# Patient Record
Sex: Female | Born: 1945
Health system: Southern US, Community
[De-identification: ages and names within clinical notes are randomized; demographics above are authoritative.]

## PROBLEM LIST (undated history)

## (undated) DIAGNOSIS — E039 Hypothyroidism, unspecified: Secondary | ICD-10-CM

## (undated) DIAGNOSIS — M359 Systemic involvement of connective tissue, unspecified: Secondary | ICD-10-CM

## (undated) DIAGNOSIS — Z9889 Other specified postprocedural states: Secondary | ICD-10-CM

## (undated) DIAGNOSIS — E079 Disorder of thyroid, unspecified: Secondary | ICD-10-CM

## (undated) DIAGNOSIS — M797 Fibromyalgia: Secondary | ICD-10-CM

## (undated) DIAGNOSIS — E785 Hyperlipidemia, unspecified: Secondary | ICD-10-CM

## (undated) DIAGNOSIS — N200 Calculus of kidney: Secondary | ICD-10-CM

## (undated) DIAGNOSIS — I10 Essential (primary) hypertension: Secondary | ICD-10-CM

## (undated) DIAGNOSIS — K649 Unspecified hemorrhoids: Secondary | ICD-10-CM

## (undated) DIAGNOSIS — M1990 Unspecified osteoarthritis, unspecified site: Secondary | ICD-10-CM

## (undated) DIAGNOSIS — R112 Nausea with vomiting, unspecified: Secondary | ICD-10-CM

## (undated) DIAGNOSIS — H3530 Unspecified macular degeneration: Secondary | ICD-10-CM

## (undated) HISTORY — DX: Systemic involvement of connective tissue, unspecified: M35.9

## (undated) HISTORY — DX: Fibromyalgia: M79.7

## (undated) HISTORY — PX: KNEE SURGERY: SHX244

## (undated) HISTORY — DX: Essential (primary) hypertension: I10

## (undated) HISTORY — DX: Disorder of thyroid, unspecified: E07.9

## (undated) HISTORY — DX: Calculus of kidney: N20.0

## (undated) HISTORY — DX: Hyperlipidemia, unspecified: E78.5

## (undated) HISTORY — PX: EYE SURGERY: SHX253

---
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
## 1987-11-06 HISTORY — PX: ABDOMINAL HYSTERECTOMY: SHX81

## 1992-11-05 HISTORY — PX: KNEE ARTHROSCOPY: SHX127

## 1994-11-05 HISTORY — PX: CHOLECYSTECTOMY: SHX55

## 1998-11-23 ENCOUNTER — Ambulatory Visit (HOSPITAL_COMMUNITY): Admission: RE | Admit: 1998-11-23 | Discharge: 1998-11-23 | Payer: Self-pay | Admitting: *Deleted

## 1999-08-15 ENCOUNTER — Ambulatory Visit (HOSPITAL_COMMUNITY): Admission: RE | Admit: 1999-08-15 | Discharge: 1999-08-15 | Payer: Self-pay | Admitting: *Deleted

## 1999-12-25 ENCOUNTER — Other Ambulatory Visit: Admission: RE | Admit: 1999-12-25 | Discharge: 1999-12-25 | Payer: Self-pay | Admitting: *Deleted

## 2000-01-09 ENCOUNTER — Ambulatory Visit (HOSPITAL_COMMUNITY): Admission: RE | Admit: 2000-01-09 | Discharge: 2000-01-09 | Payer: Self-pay | Admitting: *Deleted

## 2001-03-26 ENCOUNTER — Encounter: Payer: Self-pay | Admitting: Internal Medicine

## 2001-03-26 ENCOUNTER — Ambulatory Visit (HOSPITAL_COMMUNITY): Admission: RE | Admit: 2001-03-26 | Discharge: 2001-03-26 | Payer: Self-pay | Admitting: Internal Medicine

## 2001-04-06 ENCOUNTER — Encounter: Payer: Self-pay | Admitting: Internal Medicine

## 2001-04-07 ENCOUNTER — Encounter: Admission: RE | Admit: 2001-04-07 | Discharge: 2001-04-07 | Payer: Self-pay | Admitting: Internal Medicine

## 2001-11-05 HISTORY — PX: NECK SURGERY: SHX720

## 2001-11-12 ENCOUNTER — Encounter: Admission: RE | Admit: 2001-11-12 | Discharge: 2001-11-12 | Payer: Self-pay | Admitting: Rheumatology

## 2001-11-12 ENCOUNTER — Encounter: Payer: Self-pay | Admitting: Rheumatology

## 2002-03-13 ENCOUNTER — Encounter: Payer: Self-pay | Admitting: Neurosurgery

## 2002-03-13 ENCOUNTER — Inpatient Hospital Stay (HOSPITAL_COMMUNITY): Admission: RE | Admit: 2002-03-13 | Discharge: 2002-03-15 | Payer: Self-pay | Admitting: Neurosurgery

## 2002-05-05 ENCOUNTER — Ambulatory Visit (HOSPITAL_COMMUNITY): Admission: RE | Admit: 2002-05-05 | Discharge: 2002-05-05 | Payer: Self-pay | Admitting: Internal Medicine

## 2002-05-05 ENCOUNTER — Encounter: Payer: Self-pay | Admitting: Internal Medicine

## 2002-07-07 ENCOUNTER — Encounter: Payer: Self-pay | Admitting: Internal Medicine

## 2002-07-07 ENCOUNTER — Encounter: Admission: RE | Admit: 2002-07-07 | Discharge: 2002-07-07 | Payer: Self-pay | Admitting: Internal Medicine

## 2004-06-30 ENCOUNTER — Ambulatory Visit (HOSPITAL_COMMUNITY): Admission: RE | Admit: 2004-06-30 | Discharge: 2004-06-30 | Payer: Self-pay | Admitting: Internal Medicine

## 2005-02-12 ENCOUNTER — Ambulatory Visit (HOSPITAL_COMMUNITY): Admission: RE | Admit: 2005-02-12 | Discharge: 2005-02-12 | Payer: Self-pay | Admitting: Specialist

## 2005-07-06 ENCOUNTER — Ambulatory Visit (HOSPITAL_COMMUNITY): Admission: RE | Admit: 2005-07-06 | Discharge: 2005-07-06 | Payer: Self-pay | Admitting: Internal Medicine

## 2006-08-07 ENCOUNTER — Ambulatory Visit (HOSPITAL_COMMUNITY): Admission: RE | Admit: 2006-08-07 | Discharge: 2006-08-07 | Payer: Self-pay | Admitting: Internal Medicine

## 2007-09-05 ENCOUNTER — Encounter: Admission: RE | Admit: 2007-09-05 | Discharge: 2007-09-05 | Payer: Self-pay | Admitting: Internal Medicine

## 2007-09-05 ENCOUNTER — Ambulatory Visit (HOSPITAL_COMMUNITY): Admission: RE | Admit: 2007-09-05 | Discharge: 2007-09-05 | Payer: Self-pay | Admitting: Internal Medicine

## 2008-08-12 ENCOUNTER — Ambulatory Visit: Payer: Self-pay | Admitting: Internal Medicine

## 2008-08-12 DIAGNOSIS — M791 Myalgia, unspecified site: Secondary | ICD-10-CM | POA: Insufficient documentation

## 2008-08-13 DIAGNOSIS — R51 Headache: Secondary | ICD-10-CM | POA: Insufficient documentation

## 2008-08-13 DIAGNOSIS — E039 Hypothyroidism, unspecified: Secondary | ICD-10-CM | POA: Insufficient documentation

## 2008-08-13 DIAGNOSIS — E785 Hyperlipidemia, unspecified: Secondary | ICD-10-CM | POA: Insufficient documentation

## 2008-08-13 DIAGNOSIS — I10 Essential (primary) hypertension: Secondary | ICD-10-CM | POA: Insufficient documentation

## 2008-08-13 DIAGNOSIS — R519 Headache, unspecified: Secondary | ICD-10-CM | POA: Insufficient documentation

## 2008-08-25 ENCOUNTER — Encounter: Payer: Self-pay | Admitting: Internal Medicine

## 2008-09-13 ENCOUNTER — Ambulatory Visit (HOSPITAL_COMMUNITY): Admission: RE | Admit: 2008-09-13 | Discharge: 2008-09-13 | Payer: Self-pay | Admitting: Internal Medicine

## 2008-10-21 ENCOUNTER — Telehealth: Payer: Self-pay | Admitting: Internal Medicine

## 2008-10-21 ENCOUNTER — Ambulatory Visit: Payer: Self-pay | Admitting: Internal Medicine

## 2008-10-21 LAB — CONVERTED CEMR LAB
ALT: 14 units/L (ref 0–35)
AST: 16 units/L (ref 0–37)
Basophils Relative: 0.3 % (ref 0.0–3.0)
Cholesterol: 238 mg/dL (ref 0–200)
Direct LDL: 162 mg/dL
Eosinophils Relative: 4.1 % (ref 0.0–5.0)
GFR calc Af Amer: 94 mL/min
Glucose, Bld: 91 mg/dL (ref 70–99)
HCT: 37.5 % (ref 36.0–46.0)
HDL: 51.5 mg/dL (ref >39.0)
MCV: 94.9 fL (ref 78.0–100.0)
Neutro Abs: 1.2 10*3/uL — ABNORMAL LOW (ref 1.4–7.7)
RBC: 3.95 M/uL (ref 3.87–5.11)
TSH: 6.79 microintl units/mL — ABNORMAL HIGH (ref 0.35–5.50)
Total Bilirubin: 0.4 mg/dL (ref 0.3–1.2)
Total CHOL/HDL Ratio: 4.6
VLDL: 15 mg/dL (ref 0–40)
Varicella IgG: 3.68 — ABNORMAL HIGH

## 2008-10-26 ENCOUNTER — Telehealth: Payer: Self-pay | Admitting: Internal Medicine

## 2008-10-26 ENCOUNTER — Ambulatory Visit: Payer: Self-pay | Admitting: Internal Medicine

## 2008-10-26 DIAGNOSIS — E559 Vitamin D deficiency, unspecified: Secondary | ICD-10-CM | POA: Insufficient documentation

## 2008-11-22 ENCOUNTER — Telehealth: Payer: Self-pay | Admitting: Internal Medicine

## 2008-12-22 ENCOUNTER — Ambulatory Visit: Payer: Self-pay | Admitting: Internal Medicine

## 2008-12-22 LAB — CONVERTED CEMR LAB: TSH: 0.11 microintl units/mL — ABNORMAL LOW (ref 0.35–5.50)

## 2008-12-28 ENCOUNTER — Ambulatory Visit: Payer: Self-pay | Admitting: Internal Medicine

## 2008-12-29 ENCOUNTER — Encounter: Payer: Self-pay | Admitting: Internal Medicine

## 2008-12-29 ENCOUNTER — Telehealth (INDEPENDENT_AMBULATORY_CARE_PROVIDER_SITE_OTHER): Payer: Self-pay | Admitting: *Deleted

## 2009-03-11 ENCOUNTER — Encounter: Payer: Self-pay | Admitting: Internal Medicine

## 2009-03-24 ENCOUNTER — Ambulatory Visit: Payer: Self-pay | Admitting: Internal Medicine

## 2009-03-24 LAB — CONVERTED CEMR LAB
ALT: 18 units/L (ref 0–35)
AST: 17 units/L (ref 0–37)
Albumin: 3.6 g/dL (ref 3.5–5.2)
Creatinine, Ser: 0.8 mg/dL (ref 0.4–1.2)
Eosinophils Relative: 2 % (ref 0.0–5.0)
HCT: 35.1 % — ABNORMAL LOW (ref 36.0–46.0)
Lymphocytes Relative: 55.8 % — ABNORMAL HIGH (ref 12.0–46.0)
Lymphs Abs: 2 10*3/uL (ref 0.7–4.0)
Monocytes Absolute: 0.3 10*3/uL (ref 0.1–1.0)
Neutro Abs: 1.2 10*3/uL — ABNORMAL LOW (ref 1.4–7.7)
Platelets: 213 10*3/uL (ref 150.0–400.0)
RBC: 3.76 M/uL — ABNORMAL LOW (ref 3.87–5.11)
TSH: 1.24 microintl units/mL (ref 0.35–5.50)

## 2009-03-29 ENCOUNTER — Ambulatory Visit: Payer: Self-pay | Admitting: Internal Medicine

## 2009-04-26 ENCOUNTER — Encounter: Payer: Self-pay | Admitting: Internal Medicine

## 2009-07-04 ENCOUNTER — Ambulatory Visit: Payer: Self-pay | Admitting: Internal Medicine

## 2009-07-04 LAB — CONVERTED CEMR LAB
ALT: 12 units/L (ref 0–35)
AST: 15 units/L (ref 0–37)
Eosinophils Absolute: 0.2 10*3/uL (ref 0.0–0.7)
Hemoglobin: 12.8 g/dL (ref 12.0–15.0)
Neutro Abs: 1.1 10*3/uL — ABNORMAL LOW (ref 1.7–7.7)
Platelets: 240 10*3/uL (ref 150–400)
RBC: 4.13 M/uL (ref 3.87–5.11)
RDW: 12.9 % (ref 11.5–15.5)
TSH: 4.233 microintl units/mL (ref 0.350–4.500)
WBC: 4.1 10*3/uL (ref 4.0–10.5)

## 2009-07-05 ENCOUNTER — Telehealth: Payer: Self-pay | Admitting: Internal Medicine

## 2009-07-19 ENCOUNTER — Encounter: Payer: Self-pay | Admitting: Internal Medicine

## 2009-09-27 ENCOUNTER — Telehealth: Payer: Self-pay | Admitting: Internal Medicine

## 2009-09-27 ENCOUNTER — Ambulatory Visit: Payer: Self-pay | Admitting: Internal Medicine

## 2009-09-27 LAB — CONVERTED CEMR LAB
ALT: 15 units/L (ref 0–35)
AST: 19 units/L (ref 0–37)
Basophils Absolute: 0 10*3/uL (ref 0.0–0.1)
Basophils Relative: 1 % (ref 0–1)
Eosinophils Absolute: 0.1 10*3/uL (ref 0.0–0.7)
Eosinophils Relative: 4 % (ref 0–5)
HCT: 37.1 % (ref 36.0–46.0)
MCHC: 32.3 g/dL (ref 30.0–36.0)
Monocytes Absolute: 0.3 10*3/uL (ref 0.1–1.0)
Neutro Abs: 1.3 10*3/uL — ABNORMAL LOW (ref 1.7–7.7)
Neutrophils Relative %: 33 % — ABNORMAL LOW (ref 43–77)
RBC: 3.96 M/uL (ref 3.87–5.11)

## 2009-10-03 ENCOUNTER — Telehealth: Payer: Self-pay | Admitting: Internal Medicine

## 2009-10-04 ENCOUNTER — Ambulatory Visit: Payer: Self-pay | Admitting: Internal Medicine

## 2009-10-04 DIAGNOSIS — M359 Systemic involvement of connective tissue, unspecified: Secondary | ICD-10-CM | POA: Insufficient documentation

## 2009-10-04 DIAGNOSIS — M797 Fibromyalgia: Secondary | ICD-10-CM | POA: Insufficient documentation

## 2009-10-04 DIAGNOSIS — G4700 Insomnia, unspecified: Secondary | ICD-10-CM | POA: Insufficient documentation

## 2009-10-04 LAB — CONVERTED CEMR LAB

## 2009-10-25 ENCOUNTER — Telehealth (INDEPENDENT_AMBULATORY_CARE_PROVIDER_SITE_OTHER): Payer: Self-pay | Admitting: *Deleted

## 2009-11-01 ENCOUNTER — Ambulatory Visit: Payer: Self-pay | Admitting: Internal Medicine

## 2009-11-03 ENCOUNTER — Telehealth: Payer: Self-pay | Admitting: Internal Medicine

## 2009-11-10 ENCOUNTER — Encounter: Admission: RE | Admit: 2009-11-10 | Discharge: 2009-11-10 | Payer: Self-pay | Admitting: Internal Medicine

## 2009-11-16 ENCOUNTER — Encounter: Payer: Self-pay | Admitting: Internal Medicine

## 2010-01-02 ENCOUNTER — Encounter: Payer: Self-pay | Admitting: Internal Medicine

## 2010-01-20 ENCOUNTER — Telehealth: Payer: Self-pay | Admitting: Internal Medicine

## 2010-01-20 ENCOUNTER — Ambulatory Visit: Payer: Self-pay | Admitting: Diagnostic Radiology

## 2010-01-20 ENCOUNTER — Ambulatory Visit (HOSPITAL_BASED_OUTPATIENT_CLINIC_OR_DEPARTMENT_OTHER): Admission: RE | Admit: 2010-01-20 | Discharge: 2010-01-20 | Payer: Self-pay | Admitting: Internal Medicine

## 2010-01-20 ENCOUNTER — Ambulatory Visit: Payer: Self-pay | Admitting: Internal Medicine

## 2010-01-20 DIAGNOSIS — R142 Eructation: Secondary | ICD-10-CM

## 2010-01-20 DIAGNOSIS — R143 Flatulence: Secondary | ICD-10-CM

## 2010-01-20 DIAGNOSIS — R141 Gas pain: Secondary | ICD-10-CM | POA: Insufficient documentation

## 2010-04-17 ENCOUNTER — Encounter: Payer: Self-pay | Admitting: Internal Medicine

## 2010-07-04 ENCOUNTER — Encounter: Payer: Self-pay | Admitting: Internal Medicine

## 2010-07-04 ENCOUNTER — Ambulatory Visit: Payer: Self-pay | Admitting: Family

## 2010-07-04 DIAGNOSIS — N390 Urinary tract infection, site not specified: Secondary | ICD-10-CM | POA: Insufficient documentation

## 2010-07-04 LAB — CONVERTED CEMR LAB
Nitrite: NEGATIVE
Urobilinogen, UA: 0.2
WBC Urine, dipstick: NEGATIVE
pH: 5

## 2010-07-17 ENCOUNTER — Encounter: Payer: Self-pay | Admitting: Internal Medicine

## 2010-07-19 ENCOUNTER — Telehealth: Payer: Self-pay | Admitting: Internal Medicine

## 2010-07-24 ENCOUNTER — Ambulatory Visit: Payer: Self-pay | Admitting: Internal Medicine

## 2010-07-24 DIAGNOSIS — F329 Major depressive disorder, single episode, unspecified: Secondary | ICD-10-CM | POA: Insufficient documentation

## 2010-07-24 DIAGNOSIS — F3289 Other specified depressive episodes: Secondary | ICD-10-CM | POA: Insufficient documentation

## 2010-08-23 ENCOUNTER — Ambulatory Visit: Payer: Self-pay | Admitting: Internal Medicine

## 2010-08-30 ENCOUNTER — Telehealth: Payer: Self-pay | Admitting: Internal Medicine

## 2010-08-31 ENCOUNTER — Encounter: Payer: Self-pay | Admitting: Internal Medicine

## 2010-09-04 ENCOUNTER — Telehealth: Payer: Self-pay | Admitting: Internal Medicine

## 2010-09-19 ENCOUNTER — Telehealth: Payer: Self-pay | Admitting: Internal Medicine

## 2010-10-05 ENCOUNTER — Telehealth: Payer: Self-pay | Admitting: Internal Medicine

## 2010-10-06 ENCOUNTER — Ambulatory Visit: Payer: Self-pay | Admitting: Internal Medicine

## 2010-10-06 LAB — CONVERTED CEMR LAB
Albumin: 4 g/dL (ref 3.5–5.2)
CRP, High Sensitivity: 0.7
Calcium: 9.2 mg/dL (ref 8.4–10.5)
Chloride: 111 meq/L (ref 96–112)
Cholesterol: 200 mg/dL (ref 0–200)
Creatinine, Ser: 0.82 mg/dL (ref 0.40–1.20)
Glucose, Bld: 93 mg/dL (ref 70–99)
HDL: 56 mg/dL (ref >39)
Hemoglobin: 12.1 g/dL (ref 12.0–15.0)
Indirect Bilirubin: 0.2 mg/dL (ref 0.0–0.9)
MCHC: 31.7 g/dL (ref 30.0–36.0)
TSH: 0.296 microintl units/mL — ABNORMAL LOW (ref 0.350–4.500)
Total CHOL/HDL Ratio: 3.6
Total Protein: 6.4 g/dL (ref 6.0–8.3)
Triglycerides: 102 mg/dL (ref <150)
Vit D, 1,25-Dihydroxy: 32 (ref 30–89)
WBC: 4 10*3/uL (ref 4.0–10.5)

## 2010-10-09 ENCOUNTER — Telehealth: Payer: Self-pay | Admitting: Internal Medicine

## 2010-11-05 HISTORY — PX: BUNIONECTOMY: SHX129

## 2010-11-20 ENCOUNTER — Encounter
Admission: RE | Admit: 2010-11-20 | Discharge: 2010-11-20 | Payer: Self-pay | Source: Home / Self Care | Attending: Internal Medicine | Admitting: Internal Medicine

## 2010-11-21 ENCOUNTER — Telehealth: Payer: Self-pay | Admitting: Internal Medicine

## 2010-11-29 ENCOUNTER — Telehealth: Payer: Self-pay | Admitting: Internal Medicine

## 2010-12-07 NOTE — Progress Notes (Signed)
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 DO,  August 31, 2010 1:25 PM  Additional Follow-up for Phone Call Additional follow up Details #1::        call placed to patient at [phone], she was advised letter would be left at front desk for pick. Patient verbalized understanding, she will pick letter up at front desk. Additional Follow-up by: Glendell Docker CMA,  August 31, 2010 1:48 PM

## 2010-12-07 NOTE — Progress Notes (Signed)
Summary: Synthroid Refill  Phone Note Call from Patient Call back at Home Phone [phone]   Caller: Patient Call For: [name] DO Summary of Call: patient called and left voice message requesting a refill on her Synthroid. She states her blood work is not scheduled until February, however she will run out of medication before then.  Initial call taken by: Glendell Docker CMA,  November 21, 2010 2:38 PM    Prescriptions: SYNTHROID 137 MCG TABS (LEVOTHYROXINE SODIUM) one by mouth once daily  #30 x 0   Entered by:   Glendell Docker CMA   Authorized by:   [name] DO   Signed by:   Glendell Docker CMA on 11/21/2010   Method used:   Electronically to        [facility] (retail)       [address]       [place], [place]  [address]       Ph: [phone]       Fax: [phone]   RxID:   [id number]

## 2010-12-07 NOTE — Progress Notes (Signed)
Summary: Lab Orders  Phone Note Call from Patient Call back at [phone]   Caller: Patient Summary of Call: Pt is requesting lab orders to be sent to [facility] in [place], pt will go 07/20/10 [address] Antionette Poles at [facility] in [place], order can be faxed to [phone], phone # [phone] Initial call taken by: Lannette Donath,  July 19, 2010 10:09 AM  Follow-up for Phone Call        orders have been faxed to [phone] Follow-up by: Glendell Docker CMA,  July 19, 2010 10:32 AM

## 2010-12-07 NOTE — Letter (Signed)
Summary: [facility]  [facility]   Imported By: Lanelle Bal 05/04/2010 16:07:39  _____________________________________________________________________  External Attachment:    Type:   Image     Comment:   External Document

## 2010-12-07 NOTE — Progress Notes (Signed)
Summary: Budeproprion Refill  Phone Note Refill Request Message from:  Pharmacy on November 29, 2010 11:54 AM  Refills Requested: Medication #1:  BUDEPRION SR 150 MG TB12 1 by mouth 2 times daily   Dosage confirmed as above?Dosage Confirmed   Brand Name Necessary? No   Supply Requested: 3 months  Method Requested: Electronic Next Appointment Scheduled: 04/09/2011 @ 10:45 am Initial call taken by: Glendell Docker CMA,  November 29, 2010 11:54 AM  Follow-up for Phone Call        call placed to [facility], rx cancelled and sent electronically to [facility].  Call placed to patient at [phone],she has been informed rx sent to [facility] per her request Follow-up by: Glendell Docker CMA,  December 01, 2010 8:39 AM    Prescriptions: BUDEPRION SR 150 MG TB12 (BUPROPION HCL) 1 by mouth 2 times daily  #180 x 1   Entered by:   Glendell Docker CMA   Authorized by:   [name] DO   Signed by:   Glendell Docker CMA on 12/01/2010   Method used:   Electronically to        [facility]* (retail)             ,          Ph: [phone]       Fax: [phone]   RxID:   [id number] BUDEPRION SR 150 MG TB12 (BUPROPION HCL) 1 by mouth 2 times daily  #60 x 5   Entered and Authorized by:   [name] DO   Signed by:   [name] DO on 11/30/2010   Method used:   Electronically to        [facility].* (retail)       [address]       [place], [place]  [address]       Ph: [phone]       Fax: [phone]   RxID:   [id number]

## 2010-12-07 NOTE — Assessment & Plan Note (Signed)
Summary: uti/mhf   Vital Signs:  Patient profile:   65 year old female Weight:      166 pounds BMI:     30.47 Temp:     98.0 degrees F oral Pulse rate:   76 / minute Pulse rhythm:   regular Resp:     18 per minute BP sitting:   102 / 70  (right arm) Cuff size:   regular  Vitals Entered By: Glendell Docker CMA (July 04, 2010 3:55 PM) CC: Urinary discomfort Comments c/o urinary frequency, abdominal pressure onset week ago, no self care measures taken, also c/o dry cough for the past 2 days   Primary Care Provider:  Dondra Spry DO  CC:  Urinary discomfort.  History of Present Illness: Paula Murphy is a 65 year old female with complaint of urinary discomfort x 1 week.  Notes that she completed Cymbalta 1 week ago.  Notes chronic urinary urgency/discomfort.  Denies any blood in urine.  Patient urinated "6 times" last night.  Denies symptoms or yeast infection or vaginal discharge.  Denies fever or low back pain.   Notes mild dry cough x 2 days.  + post-nasal drip, denies sinus pressure.    Preventive Screening-Counseling & Management  Alcohol-Tobacco     Smoking Status: never  Allergies: 1)  ! Hydrocodone  Past History:  Past Medical History: Last updated: 01/20/2010 Fibromyalgia - followed by Dr. Corliss Skains Autoimmune disease - + ANA, and DS DNA Headache    Hyperlipidemia Hypertension  Hypothyroidism     Review of Systems       see HPI  Physical Exam  General:  Well-developed,well-nourished,in no acute distress; alert,appropriate and cooperative throughout examination Head:  Normocephalic and atraumatic without obvious abnormalities. No apparent alopecia or balding. Lungs:  Normal respiratory effort, chest expands symmetrically. Lungs are clear to auscultation, no crackles or wheezes. Heart:  Normal rate and regular rhythm. S1 and S2 normal without gallop, murmur, click, rub or other extra sounds. Abdomen:  Bowel sounds positive,abdomen soft and non-tender without  masses, organomegaly or hernias noted.   Impression & Recommendations:  Problem # 1:  UTI (ICD-599.0) Assessment New Will plan to treat with cipro.  Send urine for culture. Suspect that dry cough is allergy related.   Her updated medication list for this problem includes:    Cipro 500 Mg Tabs (Ciprofloxacin hcl) ..... One tablet by mouth two times a day x 7 days  Orders: Specimen Handling (98119) T-Culture, Urine ([id number])  Complete Medication List: 1)  Synthroid 150 Mcg Tabs (Levothyroxine sodium) .... Take 1 tablet by mouth once a day 2)  Alprazolam 0.25 Mg Tabs (Alprazolam) .... Take 1 tablet by mouth three times a day as needed 3)  Topamax 50 Mg Tabs (Topiramate) .... Take 1 tablet by mouth two times a day as needed 4)  Valtrex 500 Mg Tabs (Valacyclovir hcl) .... 4 tablets by mouth every 4 hours for fever blister 5)  Lunesta 1 Mg Tabs (Eszopiclone) .... One tablet by mouth at bedtime as needed 6)  Fish Oil 1200 Mg Caps (Omega-3 fatty acids) .... 3 capsules by mouth once daily 7)  Plaquenil 200 Mg Tabs (Hydroxychloroquine sulfate) .... Take 1 tablet by mouth two times a day 8)  Budeprion Sr 150 Mg Tb12 (Bupropion hcl) .Marland Kitchen.. 1 by mouth 2 times daily 9)  Cipro 500 Mg Tabs (Ciprofloxacin hcl) .... One tablet by mouth two times a day x 7 days  Other Orders: UA Dipstick w/o Micro (manual) (30865)  Patient Instructions: 1)  Please call if your develop fever over 101, blood in urine,  back pain, if symptoms worsen, or if they do not improve.  Prescriptions: CIPRO 500 MG TABS (CIPROFLOXACIN HCL) one tablet by mouth two times a day x 7 days  #14 x 0   Entered and Authorized by:   Lemont Fillers FNP   Signed by:   Lemont Fillers FNP on 07/04/2010   Method used:   Electronically to        [facility].* (retail)       [address]       [place], [place]  [address]       Ph: [phone]       Fax: [phone]   RxID:   [id number]   Current Allergies  (reviewed today): ! HYDROCODONE  Laboratory Results   Urine Tests    Routine Urinalysis   Color: straw Appearance: Clear Glucose: negative   (Normal Range: Negative) Bilirubin: negative   (Normal Range: Negative) Ketone: moderate (40)   (Normal Range: Negative) Spec. Gravity: >=1.030   (Normal Range: 1.003-1.035) Blood: trace-lysed   (Normal Range: Negative) pH: 5.0   (Normal Range: 5.0-8.0) Protein: trace   (Normal Range: Negative) Urobilinogen: 0.2   (Normal Range: 0-1) Nitrite: negative   (Normal Range: Negative) Leukocyte Esterace: negative   (Normal Range: Negative)

## 2010-12-07 NOTE — Progress Notes (Signed)
Summary: Lab Results  Phone Note Outgoing Call   Summary of Call: call pt - blood work shows pt still taking too much thyroid medication.  see new rx.  repeat TSH in 2 months Initial call taken by: [name] DO,  October 09, 2010 1:12 PM  Follow-up for Phone Call        call placed to patient at [phone], she has been informed per Dr Artist Pais instructions. Lab has been entered for February 2012 for [place] Follow-up by: Glendell Docker CMA,  October 09, 2010 1:27 PM    New/Updated Medications: SYNTHROID 137 MCG TABS (LEVOTHYROXINE SODIUM) one by mouth once daily Prescriptions: SYNTHROID 137 MCG TABS (LEVOTHYROXINE SODIUM) one by mouth once daily  #30 x 3   Entered and Authorized by:   [name] DO   Signed by:   [name] DO on 10/09/2010   Method used:   Electronically to        [facility] (retail)       [address]       [place], [place]  [address]       Ph: [phone]       Fax: [phone]   RxID:   [id number]

## 2010-12-07 NOTE — Progress Notes (Signed)
Summary: REFILL SYNTHROID   Phone Note Refill Request Message from:  Fax from Pharmacy on October 05, 2010 10:21 AM  Refills Requested: Medication #1:  SYNTHROID 150 MCG TABS Take 1 tablet by mouth once a day   Dosage confirmed as above?Dosage Confirmed   Brand Name Necessary? No   Supply Requested: 3 months   Last Refilled: 06/30/2010 [facility] [id number] [address] [place] FAX [phone]   Method Requested: Electronic Next Appointment Scheduled: 10-06-10 DR Artist Pais Initial call taken by: Roselle Locus,  October 05, 2010 10:22 AM    Prescriptions: SYNTHROID 150 MCG TABS (LEVOTHYROXINE SODIUM) Take 1 tablet by mouth once a day  #90 x 1   Entered by:   Mervin Kung CMA (AAMA)   Authorized by:   [name] DO   Signed by:   Mervin Kung CMA (AAMA) on 10/05/2010   Method used:   Electronically to        [facility].* (retail)       [address]       [place], [place]  [address]       Ph: [phone]       Fax: [phone]   RxID:   [id number]

## 2010-12-07 NOTE — Progress Notes (Signed)
Summary: Test Results & Pharmacy Change  Phone Note Outgoing Call   Summary of Call: call pt - CXR - no acute findings.  no nodules or signs of lung cancer Initial call taken by: [name] DO,  January 20, 2010 3:47 PM  Follow-up for Phone Call        Called and informed patient of cxr results and patient states that her prescriptions were sent to the wrong pharm. and would like for Dr.Yoo to resend them to  [facility] in [place]  Follow-up by: Michaelle Copas,  January 23, 2010 10:32 AM  Additional Follow-up for Phone Call Additional follow up Details #1::        please send rx to new pharm Additional Follow-up by: [name] DO,  January 23, 2010 1:15 PM    Additional Follow-up for Phone Call Additional follow up Details #2::    Rxs sent to [facility], Spoke with the pharmacist Tammy Sours at [facility] rxs for metronidazole and synthroid were cancelled Follow-up by: Glendell Docker CMA,  January 23, 2010 1:59 PM  Prescriptions: METRONIDAZOLE 250 MG TABS (METRONIDAZOLE) one by mouth two times a day  #14 x 0   Entered by:   Glendell Docker CMA   Authorized by:   [name] DO   Signed by:   Glendell Docker CMA on 01/23/2010   Method used:   Electronically to        [facility].* (retail)       [address]       [place], [place]  [address]       Ph: [phone]       Fax: [phone]   RxID:   [id number] SYNTHROID 150 MCG TABS (LEVOTHYROXINE SODIUM) Take 1 tablet by mouth once a day  #90 x 1   Entered by:   Glendell Docker CMA   Authorized by:   [name] DO   Signed by:   Glendell Docker CMA on 01/23/2010   Method used:   Electronically to        [facility].* (retail)       [address]       [place], [place]  [address]       Ph: [phone]       Fax: [phone]   RxID:   [id number]

## 2010-12-07 NOTE — Assessment & Plan Note (Signed)
Summary: Paula Murphy   Vital Signs:  Patient profile:   65 year old female Height:      62 inches (157.48 cm) Weight:      165 pounds (75 kg) BMI:     30.29 O2 Sat:      99 % on Room air Temp:     97.7 degrees F (36.50 degrees C) oral Pulse rate:   75 / minute BP sitting:   124 / 80  (left arm) Cuff size:   regular  Vitals Entered By: Brenton Grills MA (July 24, 2010 8:51 AM)  O2 Flow:  Room air CC: Physical/aj   Primary Care Provider:  Dondra Spry DO  CC:  Physical/aj.  History of Present Illness: 65 y/o white female for cpx and f/u.  she has hx of fibro and possible inflammatory arthritis she is followed by rheum pt advised by her rheum not to take zostavax  int hx: she stopped taking cymbalta on her own.  she did not taper and exp significant withdrawal effects withdrawal symptoms better but notes increase in depressive symptoms her daughter notes change in her mood.  she is not suicidial  Current Diet Breakfast:  cheerios with banannas, occ blue berries Lunch:  usually no lunch,  sometimes protein bar or light sandwich DInner:  grilled chicken and green beans Snacks:  milk chocolate every day,  chocolate chip cookies Beverage:  water   Current Medications (verified): 1)  Synthroid 150 Mcg Tabs (Levothyroxine Sodium) .... Take 1 Tablet By Mouth Once A Day 2)  Alprazolam 0.25 Mg Tabs (Alprazolam) .... Take 1 Tablet By Mouth Three Times A Day As Needed 3)  Topamax 50 Mg Tabs (Topiramate) .... Take 1 Tablet By Mouth Two Times A Day As Needed 4)  Valtrex 500 Mg Tabs (Valacyclovir Hcl) .... 4 Tablets By Mouth Every 4 Hours For Fever Blister 5)  Lunesta 1 Mg Tabs (Eszopiclone) .... One Tablet By Mouth At Bedtime As Needed 6)  Fish Oil 1200 Mg Caps (Omega-3 Fatty Acids) .... 3 Capsules By Mouth Once Daily 7)  Plaquenil 200 Mg Tabs (Hydroxychloroquine Sulfate) .... Take 1 Tablet By Mouth Two Times A Day 8)  Budeprion Sr 150 Mg Tb12 (Bupropion Hcl) .Marland Kitchen.. 1 By Mouth 2  Times Daily 9)  Cipro 500 Mg Tabs (Ciprofloxacin Hcl) .... One Tablet By Mouth Two Times A Day X 7 Days 10)  Robaxin 500 Mg Tabs (Methocarbamol) .Marland Kitchen.. 1 By Mouth Three Times A Day As Needed 11)  Lidoderm 5 % Ptch (Lidocaine) .Marland Kitchen.. 1 Three Times A Day As Needed  Allergies (verified): 1)  ! Hydrocodone  Past History:  Past Medical History: Fibromyalgia - followed by Dr. Corliss Skains Autoimmune disease - + ANA, and DS DNA Headache    Hyperlipidemia  Hypertension  Hypothyroidism     Past Surgical History: Cholecystectomy - 1996 Hysterectomy - 1990   Tonsillectomy - 1953  Neck surgery -2003 Arthroscopic right knee surgery 1994    Family History: Stroke-mother, father Hypertension-mother, father Diabetes mellitus type II-father Hyperlipidemia-mother, father Lung cancer - mother, father  Rheumatoid arthritis  -father      Social History: Retired  Married 1 daughter and 66 y/o grandson  Never Smoked Alcohol use-yes (social)        Review of Systems       The patient complains of depression.  The patient denies weight loss, weight gain, chest pain, prolonged cough, abdominal pain, melena, hematochezia, and severe indigestion/heartburn.    Physical Exam  General:  alert, well-developed, and well-nourished.   Head:  normocephalic and atraumatic.   Eyes:  pupils equal, pupils round, and pupils reactive to light.   Ears:  R ear normal and L ear normal.   Mouth:  pharynx pink and moist.   Neck:  No deformities, masses, or tenderness noted.no carotid bruits.   Lungs:  Normal respiratory effort, chest expands symmetrically. Lungs are clear to auscultation, no crackles or wheezes. Heart:  Normal rate and regular rhythm. S1 and S2 normal without gallop, murmur, click, rub or other extra sounds. Abdomen:  soft, non-tender, normal bowel sounds, no masses, no hepatomegaly, and no splenomegaly.   Extremities:  No lower extremity edema  Neurologic:  cranial nerves II-XII intact and gait  normal.   Psych:  normally interactive, good eye contact, not anxious appearing, and not depressed appearing.     Impression & Recommendations:  Problem # 1:  HEALTH MAINTENANCE EXAM (ICD-V70.0) Reviewed adult health maintenance protocols.  Mammogram: ASSESSMENT: Negative - BI-RADS 1^MM DIGITAL SCREENING (11/10/2009) Pap smear: Declined-Hysterectomy (10/04/2009) Colonoscopy: normal (02/23/2008) Td Booster: Historical (07/22/2007)   Flu Vax: Historical (08/30/2009)   Pneumovax: Pneumovax (03/29/2009) Chol: 177 (12/22/2008)   HDL: 42.8 (12/22/2008)   LDL: 113 (12/22/2008)   TG: 107 (12/22/2008) TSH: 0.135 (09/27/2009)     Problem # 2:  HYPOTHYROIDISM (ICD-244.9) she has not been taking thyroid medication regularly recent TSH 6.396 keep same dose of thyroid medication.  pt will try to take as directed. plan - repeat TSH next month  Her updated medication list for this problem includes:    Synthroid 150 Mcg Tabs (Levothyroxine sodium) .Marland Kitchen... Take 1 tablet by mouth once a day  Problem # 3:  DEPRESSION (ICD-311) pt stopped cymbalta on her own.  she notes significant withdrawal symptoms.  her rheum rec psych referral but she would like for [place] to handle.   trial of low dose SSRA.  maintain bupropion. Patient advised to call office if symptoms persist or worsen.  Her updated medication list for this problem includes:    Alprazolam 0.25 Mg Tabs (Alprazolam) .Marland Kitchen... Take 1 tablet by mouth three times a day as needed    Budeprion Sr 150 Mg Tb12 (Bupropion hcl) .Marland Kitchen... 1 by mouth 2 times daily    Sertraline Hcl 25 Mg Tabs (Sertraline hcl) .Marland Kitchen... 1/2 by mouth once daily x 7 days, then one by mouth once daily  Complete Medication List: 1)  Synthroid 150 Mcg Tabs (Levothyroxine sodium) .... Take 1 tablet by mouth once a day 2)  Alprazolam 0.25 Mg Tabs (Alprazolam) .... Take 1 tablet by mouth three times a day as needed 3)  Topamax 50 Mg Tabs (Topiramate) .... Take 1 tablet by mouth two times a day as  needed 4)  Valtrex 500 Mg Tabs (Valacyclovir hcl) .... 4 tablets by mouth every 4 hours for fever blister 5)  Lunesta 1 Mg Tabs (Eszopiclone) .... One tablet by mouth at bedtime as needed 6)  Fish Oil 1200 Mg Caps (Omega-3 fatty acids) .... 3 capsules by mouth once daily 7)  Plaquenil 200 Mg Tabs (Hydroxychloroquine sulfate) .... Take 1 tablet by mouth two times a day 8)  Budeprion Sr 150 Mg Tb12 (Bupropion hcl) .Marland Kitchen.. 1 by mouth 2 times daily 9)  Cipro 500 Mg Tabs (Ciprofloxacin hcl) .... One tablet by mouth two times a day x 7 days 10)  Robaxin 500 Mg Tabs (Methocarbamol) .Marland Kitchen.. 1 by mouth three times a day as needed 11)  Lidoderm 5 % Ptch (Lidocaine) .Marland KitchenMarland KitchenMarland Kitchen  1 three times a day as needed 12)  Sertraline Hcl 25 Mg Tabs (Sertraline hcl) .... 1/2 by mouth once daily x 7 days, then one by mouth once daily  Patient Instructions: 1)  Please schedule a follow-up appointment in 1 month. Prescriptions: ALPRAZOLAM 0.25 MG TABS (ALPRAZOLAM) Take 1 tablet by mouth three times a day as needed  #270 x 1   Entered and Authorized by:   [name] DO   Signed by:   [name] DO on 07/24/2010   Method used:   Print then Give to Patient   RxID:   [id number] SERTRALINE HCL 25 MG TABS (SERTRALINE HCL) 1/2 by mouth once daily x 7 days, then one by mouth once daily  #30 x 1   Entered and Authorized by:   [name] DO   Signed by:   [name] DO on 07/24/2010   Method used:   Electronically to        [facility] (retail)       [address]       [place], [place]  [address]       Ph: [phone]       Fax: [phone]   RxID:   [id number]

## 2010-12-07 NOTE — Assessment & Plan Note (Signed)
Summary: 3 MONTH FOLLOW UP/.MHF   Vital Signs:  Patient profile:   65 year old female Height:      62 inches Weight:      165 pounds BMI:     30.29 O2 Sat:      100 % on Room air Temp:     97.7 degrees F oral Pulse rate:   74 / minute Pulse rhythm:   regular Resp:     20 per minute BP sitting:   100 / 66  (right arm) Cuff size:   large  Vitals Entered By: Glendell Docker CMA (January 20, 2010 9:11 AM)  O2 Flow:  Room air CC: Rm 3- 3 Month Follow up    Primary Care Provider:  Dondra Spry DO  CC:  Rm 3- 3 Month Follow up .  History of Present Illness:  65 y/o female with hx of fibromyalgia and hypothyroidism c/o abd bloating and gassy sensation.   ongoing x 3 week.  no abd pain no diarrhea or blood in stool    Allergies: 1)  ! Hydrocodone  Past History:  Past Medical History: Fibromyalgia - followed by Dr. Corliss Skains Autoimmune disease - + ANA, and DS DNA Headache    Hyperlipidemia Hypertension  Hypothyroidism     Family History: Stroke-mother, father Hypertension-mother, father Diabetes mellitus type II-father Hyperlipidemia-mother, father Lung cancer - mother, father  Rheumatoid arthritis  -father     Social History: Retired  Married 1 daughter and 62 y/o grandson  Never Smoked Alcohol use-yes (social)      Review of Systems       pt worried about lung cancer  Physical Exam  General:  alert, well-developed, and well-nourished.   Lungs:  normal respiratory effort and normal breath sounds.   Heart:  normal rate, regular rhythm, and no gallop.   Abdomen:  mild non localized tenderness,  soft and normal bowel sounds.   Extremities:  No lower extremity edema  Psych:  normally interactive and good eye contact.     Impression & Recommendations:  Problem # 1:  ABDOMINAL BLOATING (ICD-787.3) abd bloating likely from bacterial overgrowth / IBS.  use flagyl.  start fiber supplement.  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 Take 1 tablet by mouth once a day 2)  Synthroid 150 Mcg Tabs (Levothyroxine sodium) .... Take 1 tablet by mouth once a day 3)  Alprazolam 0.25 Mg Tabs (Alprazolam) .... Take 1 tablet by mouth three times a day as needed 4)  Topamax 50 Mg Tabs (Topiramate) .... Take 1 tablet by mouth two times a day as needed 5)  Valtrex 500 Mg Tabs (Valacyclovir hcl) .... 4 tablets by mouth every 4 hours for fever blister 6)  Lunesta 1 Mg Tabs (Eszopiclone) .... One tablet by mouth at bedtime as needed 7)  Fish Oil 1200 Mg Caps (Omega-3 fatty acids) .... 3 capsules by mouth once daily 8)  Plaquenil 200 Mg Tabs (Hydroxychloroquine sulfate) .... Take 1 tablet by mouth two times a day 9)  Budeprion Sr 150 Mg Tb12 (Bupropion hcl) .Marland Kitchen.. 1 by mouth 2 times daily  Patient Instructions: 1)  Use citrucelle once daily 2)  Please schedule a follow-up appointment in 6 months for CPX Prescriptions: SYNTHROID 150 MCG TABS (LEVOTHYROXINE SODIUM) Take 1 tablet by mouth once a day  #90 x 1  Entered and Authorized by:   [name] DO   Signed by:   [name] DO on 01/20/2010   Method used:   Electronically to        [facility] [phone]* (retail)       [address]       [place], [place]  [address]       Ph: [phone]       Fax: [phone]   RxID:   [id number] METRONIDAZOLE 250 MG TABS (METRONIDAZOLE) one by mouth two times a day  #14 x 0   Entered and Authorized by:   [name] DO   Signed by:   [name] DO on 01/20/2010   Method used:   Electronically to        [facility] [phone]* (retail)       [address]       [place], [place]  [address]       Ph: [phone]       Fax: [phone]   RxID:   [id number]   Current Allergies (reviewed today): ! HYDROCODONE

## 2010-12-07 NOTE — Progress Notes (Signed)
Summary: refill-- Cipro  Phone Note Outgoing Call Call back at [phone]   Call placed by: Mervin Kung, CMA (AAMA) Call placed to: Patient Summary of Call: Received refill request from pharmacy for Cipro. Left message for pt to return my call. Pt will need appt for an antibiotic. [name] CMA Duncan Dull)  September 19, 2010 3:42 PM   Follow-up for Phone Call        Pt returned my call. States she is having burning with urination again. Is requesting an  antibiotic. Advised pt she would need to be seen before we could prescribe an antibiotic. Pt states she will wait and see if it clears up and will call us back if she feels she needs an appointment. Please advise. [name] CMA Duncan Dull)  September 19, 2010 3:45 PM   Additional Follow-up for Phone Call Additional follow up Details #1::        pt can drop off urine sample if abd pain , back pain or fever - pt needs to be seen Additional Follow-up by: [name] DO,  September 20, 2010 12:19 PM    Additional Follow-up for Phone Call Additional follow up Details #2::    Spoke to pt. She denies abd. pain, back pain or fever. Advised pt she could stop by and leave urine specimen per Dr Olegario Messier recommendation. Pt states she will stop by tomorrow or Friday to leave sample. [name] CMA Duncan Dull)  September 20, 2010 4:47 PM

## 2010-12-07 NOTE — Miscellaneous (Signed)
Summary: Orders Update  Clinical Lists Changes  Orders: Added new Test order of T-Basic Metabolic Panel [id number]) - Signed Added new Test order of T-Lipid Profile [phone]) - Signed Added new Test order of T-Hepatic Function [phone]) - Signed Added new Test order of T-CBC w/Diff [phone]) - Signed Added new Test order of T-TSH ([id number]) - Signed

## 2010-12-07 NOTE — Assessment & Plan Note (Signed)
Summary: 1 month follow up/mhf   Vital Signs:  Patient profile:   65 year old female Height:      62 inches Weight:      163 pounds BMI:     29.92 O2 Sat:      100 % on Room air Temp:     97.9 degrees F oral Pulse rate:   74 / minute Pulse rhythm:   regular Resp:     18 per minute BP sitting:   98 / 60  (left arm) Cuff size:   large  Vitals Entered By: Glendell Docker CMA (August 23, 2010 8:57 AM)  O2 Flow:  Room air CC: 1 month follow up  Is Patient Diabetic? No Pain Assessment Patient in pain? no        Primary Care Annasophia Crocker:  Dondra Spry DO  CC:  1 month follow up .  History of Present Illness: 65 y/o white female for f/u depressive symptoms improved - she only took sertraline for short period of time  since stopping cymbalta - her fibromyalgia symptoms much worse she tried gabapentin in the past but could not tolerate    Preventive Screening-Counseling & Management  Alcohol-Tobacco     Smoking Status: never  Allergies: 1)  ! Hydrocodone  Past History:  Past Medical History: Fibromyalgia - followed by Dr. Corliss Skains Autoimmune disease - + ANA, and DS DNA Headache     Hyperlipidemia  Hypertension  Hypothyroidism     Past Surgical History: Cholecystectomy - 1996 Hysterectomy - 1990   Tonsillectomy - 1953  Neck surgery -2003 Arthroscopic right knee surgery 1994     Family History: Stroke-mother, father Hypertension-mother, father Diabetes mellitus type II-father Hyperlipidemia-mother, father Lung cancer - mother, father  Rheumatoid arthritis  -father       Social History: Retired  Married  1 daughter and 74 y/o grandson  Never Smoked Alcohol use-yes (social)         Physical Exam  General:  alert, well-developed, and well-nourished.   Lungs:  normal respiratory effort and normal breath sounds.   Heart:  normal rate, regular rhythm, and no gallop.   Extremities:  No lower extremity edema  Psych:  normally interactive, good eye  contact, not anxious appearing, and not depressed appearing.     Impression & Recommendations:  Problem # 1:  DEPRESSION (ICD-311) Assessment Improved  The following medications were removed from the medication list:    Sertraline Hcl 25 Mg Tabs (Sertraline hcl) .Marland Kitchen... 1/2 by mouth once daily x 7 days, then one by mouth once daily Her updated medication list for this problem includes:    Clonazepam 0.5 Mg Tabs (Clonazepam) ..... One by mouth at bedtime prn    Budeprion Sr 150 Mg Tb12 (Bupropion hcl) .Marland Kitchen... 1 by mouth 2 times daily  Problem # 2:  FIBROMYALGIA (ICD-729.1) Assessment: Deteriorated she did not tolerate gabapentin in the past.   she will likely have similar intolerance to lyrica.  lyrica also cost prohibitive I suggest trial of light therapy consider restart low intensity exercise program   Her updated medication list for this problem includes:    Robaxin 500 Mg Tabs (Methocarbamol) .Marland Kitchen... 1 by mouth three times a day as needed  Complete Medication List: 1)  Synthroid 150 Mcg Tabs (Levothyroxine sodium) .... Take 1 tablet by mouth once a day 2)  Clonazepam 0.5 Mg Tabs (Clonazepam) .... One by mouth at bedtime prn 3)  Topamax 50 Mg Tabs (Topiramate) .... Take 1 tablet by  mouth two times a day as needed 4)  Valtrex 500 Mg Tabs (Valacyclovir hcl) .... 4 tablets by mouth every 4 hours for fever blister 5)  Plaquenil 200 Mg Tabs (Hydroxychloroquine sulfate) .... Take 1 tablet by mouth two times a day 6)  Budeprion Sr 150 Mg Tb12 (Bupropion hcl) .Marland Kitchen.. 1 by mouth 2 times daily 7)  Robaxin 500 Mg Tabs (Methocarbamol) .Marland Kitchen.. 1 by mouth three times a day as needed 8)  Lidoderm 5 % Ptch (Lidocaine) .Marland Kitchen.. 1 three times a day as needed  Other Orders: Influenza Vaccine MCR ([address]) Administration Flu vaccine - MCR (U0454)  Patient Instructions: 1)  Please schedule a follow-up appointment in 2 months. Prescriptions: CLONAZEPAM 0.5 MG TABS (CLONAZEPAM) one by mouth at bedtime prn  #30 x  2   Entered and Authorized by:   [name] DO   Signed by:   [name] DO on 08/23/2010   Method used:   Print then Give to Patient   RxID:   [id number]    Orders Added: 1)  Influenza Vaccine MCR [00025] 2)  Administration Flu vaccine - MCR [G0008] 3)  Est. Patient Level III [30865]   Immunizations Administered:  Influenza Vaccine # 1:    Vaccine Type: Fluvax MCR    Site: left deltoid    Mfr: GlaxoSmithKline    Dose: 0.5 ml    Route: IM    Given by: Glendell Docker CMA    Exp. Date: 05/05/2011    Lot #: [id number]    VIS given: 05/30/10 version given August 23, 2010.  Flu Vaccine Consent Questions:    Do you have a history of severe allergic reactions to this vaccine? no    Any prior history of allergic reactions to egg and/or gelatin? no    Do you have a sensitivity to the preservative Thimersol? no    Do you have a past history of Guillan-Barre Syndrome? no    Do you currently have an acute febrile illness? no    Have you ever had a severe reaction to latex? no    Vaccine information given and explained to patient? yes    Are you currently pregnant? no   Immunizations Administered:  Influenza Vaccine # 1:    Vaccine Type: Fluvax MCR    Site: left deltoid    Mfr: GlaxoSmithKline    Dose: 0.5 ml    Route: IM    Given by: Glendell Docker CMA    Exp. Date: 05/05/2011    Lot #: [id number]    VIS given: 05/30/10 version given August 23, 2010.  Current Allergies (reviewed today): ! HYDROCODONE

## 2010-12-07 NOTE — Progress Notes (Signed)
Summary: light for depression / fibromyalgia  Phone Note Call from Patient Call back at [phone]   Caller: Patient Call For: [name] DO Summary of Call: Pt left voice message stating that Dr Artist Pais had ordered a light for her to use for depression and fibromyalgia pain. Wants to know if the light will make her macular degeneration worse? She spoke to her retina specialist but he wasn't familiar with the light and advised her to call us. [name] CMA Duncan Dull)  September 04, 2010 8:32 AM   Follow-up for Phone Call        I am not sure if light therapy can worsen her macular degeneration.  If her ophthalmologist is not sure either,  I would not use  Follow-up by: [name] DO,  September 04, 2010 2:54 PM  Additional Follow-up for Phone Call Additional follow up Details #1::        Pt notified per Dr Olegario Messier instruction and voices understanding. [name] CMA Duncan Dull)  September 05, 2010 10:38 AM

## 2010-12-07 NOTE — Assessment & Plan Note (Signed)
Summary: 2 month fu/dt   Vital Signs:  Patient profile:   65 year old female Height:      62 inches Weight:      163.50 pounds BMI:     30.01 O2 Sat:      100 % on Room air Temp:     97.8 degrees F oral Pulse rate:   67 / minute Resp:     16 per minute BP sitting:   110 / 70  (right arm) Cuff size:   large  Vitals Entered By: Glendell Docker CMA (October 06, 2010 9:22 AM)  O2 Flow:  Room air  Contraindications/Deferment of Procedures/Staging:    Test/Procedure: Zoster vaccine    Reason for deferment: declined  CC: 2 Month  follow up Is Patient Diabetic? No Pain Assessment Patient in pain? no      Comments no concerns     Last PAP Result Hysterectomy   Primary Care Provider:  Dondra Spry DO  CC:  2 Month  follow up.  History of Present Illness: 65 y/o white female for f/u re:  fibromyalgia and depression fibromyalgia much better since starting lyrica her eye doctor was not sure about using light box    Preventive Screening-Counseling & Management  Alcohol-Tobacco     Smoking Status: never  Allergies: 1)  ! Hydrocodone  Past History:  Past Medical History: Fibromyalgia - followed by Dr. Corliss Skains Autoimmune disease - + ANA, and DS DNA Headache     Hyperlipidemia  Hypertension   Hypothyroidism     Past Surgical History: Cholecystectomy - 1996 Hysterectomy - 1990   Tonsillectomy - 1953  Neck surgery -2003  Arthroscopic right knee surgery 1994     Family History: Stroke-mother, father Hypertension-mother, father Diabetes mellitus type II-father Hyperlipidemia-mother, father Lung cancer - mother, father   Rheumatoid arthritis  -father       Social History: Retired  Married  1 daughter and 69 y/o grandson  Never Smoked Alcohol use-yes (social)          Physical Exam  General:  alert, well-developed, and well-nourished.   Lungs:  normal respiratory effort and normal breath sounds.   Heart:  normal rate, regular rhythm, and no  gallop.   Extremities:  No lower extremity edema  Psych:  normally interactive, good eye contact, not anxious appearing, and not depressed appearing.     Impression & Recommendations:  Problem # 1:  FIBROMYALGIA (ICD-729.1) Assessment Improved Lyrica 50 mg in AM started by rheum good response  Her updated medication list for this problem includes:    Robaxin 500 Mg Tabs (Methocarbamol) .Marland Kitchen... 1 by mouth three times a day as needed  Problem # 2:  DEPRESSION (ICD-311) Assessment: Improved she is back on sertraline  Her updated medication list for this problem includes:    Clonazepam 1 Mg Tabs (Clonazepam) ..... One by mouth at bedtime as needed    Budeprion Sr 150 Mg Tb12 (Bupropion hcl) .Marland Kitchen... 1 by mouth 2 times daily    Sertraline Hcl 50 Mg Tabs (Sertraline hcl) ..... One by mouth once daily  Orders: T-CBC No Diff ([id number])  Problem # 3:  HYPERLIPIDEMIA (ICD-272.4)  Orders: T-Lipid Profile ([id number]) T-Hepatic Function (02725-36644) CRP, high sensitivity-[facility] (03474-25956)  Problem # 4:  HYPOTHYROIDISM (ICD-244.9)  Her updated medication list for this problem includes:    Synthroid 150 Mcg Tabs (Levothyroxine sodium) .Marland Kitchen... Take 1 tablet by mouth once a day  Orders: T-TSH ([id number])  Complete Medication List: 1)  Synthroid 150 Mcg Tabs (Levothyroxine sodium) .... Take 1 tablet by mouth once a day 2)  Clonazepam 1 Mg Tabs (Clonazepam) .... One by mouth at bedtime as needed 3)  Topamax 50 Mg Tabs (Topiramate) .... Take 1 tablet by mouth two times a day as needed 4)  Valtrex 500 Mg Tabs (Valacyclovir hcl) .... 4 tablets by mouth every 4 hours for fever blister 5)  Plaquenil 200 Mg Tabs (Hydroxychloroquine sulfate) .... Take 1 tablet by mouth two times a day 6)  Budeprion Sr 150 Mg Tb12 (Bupropion hcl) .Marland Kitchen.. 1 by mouth 2 times daily 7)  Robaxin 500 Mg Tabs (Methocarbamol) .Marland Kitchen.. 1 by mouth three times a day as needed 8)  Lidoderm 5 % Ptch (Lidocaine) .Marland Kitchen.. 1 three  times a day as needed 9)  Lyrica 50 Mg Caps (Pregabalin) .... Take 1 capsule by mouth once a day 10)  Sertraline Hcl 50 Mg Tabs (Sertraline hcl) .... One by mouth once daily  Other Orders: T- * Misc. Laboratory test [phone]) T-Basic Metabolic Panel [id number])  Patient Instructions: 1)  Please schedule a follow-up appointment in 6 months. Prescriptions: CLONAZEPAM 1 MG TABS (CLONAZEPAM) one by mouth at bedtime as needed  #30 x 5   Entered and Authorized by:   [name] DO   Signed by:   [name] DO on 10/06/2010   Method used:   Print then Give to Patient   RxID:   [id number] SERTRALINE HCL 50 MG TABS (SERTRALINE HCL) one by mouth once daily  #90 x 1   Entered and Authorized by:   [name] DO   Signed by:   [name] DO on 10/06/2010   Method used:   Electronically to        [facility].* (retail)       [address]       [place], [place]  [address]       Ph: [phone]       Fax: [phone]   RxID:   [id number]    Orders Added: 1)  T- * Misc. Laboratory test [99999] 2)  T-TSH [phone] 3)  T-Basic Metabolic Panel [phone] 4)  T-Lipid Profile [80061-22930] 5)  T-Hepatic Function [80076-22960] 6)  CRP, high sensitivity-[facility] [16010-93235] 7)  T-CBC No Diff [[address]27-[address]] 8)  Est. Patient Level III [[address]]    Current Allergies (reviewed today): ! HYDROCODONE   Preventive Care Screening  Pap Smear:    Date:  10/06/2010    Results:  Hysterectomy

## 2010-12-07 NOTE — Letter (Signed)
Summary: [facility]  [facility]   Imported By: Lanelle Bal 11/23/2009 11:54:20  _____________________________________________________________________  External Attachment:    Type:   Image     Comment:   External Document

## 2010-12-07 NOTE — Letter (Signed)
Summary: Generic Letter  [place] [facility]  [address]   [place], [place] [address]   Phone: [phone]  Fax: [phone]    08/31/2010  [facility] [place] [address] [place], [place]  [address]    To whom it may concern,    I have advised Ms. Marissa Weaver not to return to work due to her ongoing issues with fibromyalgia pain.  She has signficant functional limitations due to the chronic nature of her condition.            Sincerely,     Dr. Thomos Lemons [facility]

## 2010-12-19 ENCOUNTER — Encounter: Payer: Self-pay | Admitting: Internal Medicine

## 2010-12-19 ENCOUNTER — Telehealth: Payer: Self-pay | Admitting: Internal Medicine

## 2010-12-21 ENCOUNTER — Encounter: Payer: Self-pay | Admitting: Internal Medicine

## 2010-12-27 NOTE — Progress Notes (Signed)
Summary: Lab results  Phone Note Outgoing Call   Summary of Call: call pt - thyroid blood test is normal.  continue same dose of thyroid medication Initial call taken by: [name] DO,  December 19, 2010 4:45 PM  Follow-up for Phone Call        call placed to patient at [phone], she has been informed per Dr Artist Pais instructions Follow-up by: Glendell Docker CMA,  December 20, 2010 8:51 AM    Prescriptions: SYNTHROID 137 MCG TABS (LEVOTHYROXINE SODIUM) one by mouth once daily  #90 x 1   Entered and Authorized by:   [name] DO   Signed by:   [name] DO on 12/19/2010   Method used:   Electronically to        [facility] (retail)       [address]       [place], [place]  [address]       Ph: [phone]       Fax: [phone]   RxID:   [id number]

## 2011-01-11 NOTE — Letter (Signed)
Summary: [facility]   [facility]   Imported By: Maryln Gottron 01/03/2011 15:43:27  _____________________________________________________________________  External Attachment:    Type:   Image     Comment:   External Document

## 2011-01-26 ENCOUNTER — Telehealth: Payer: Self-pay | Admitting: *Deleted

## 2011-01-26 NOTE — Telephone Encounter (Signed)
Call placed to patient at [phone] she was informed per Dr Artist Pais instructions, she states she checked with rheumatologist first and they advised her to call Dr Artist Pais. She was informed that she will need to contact rheumatology for refill. Patient verbalized understanding and agrees

## 2011-01-26 NOTE — Telephone Encounter (Signed)
Pt called back for status of refill would like to be called with discussion

## 2011-01-26 NOTE — Telephone Encounter (Signed)
As per prev OV notes,  She was switched to clonazepam. Why is requesting refill for alprazolam

## 2011-01-26 NOTE — Telephone Encounter (Signed)
Patient called and left voice message requesting refill on Xanax to [facility]. She is requesting a 3 month supply.

## 2011-01-26 NOTE — Telephone Encounter (Signed)
Call returned to patient at [phone], patient states she is using the Lorazepam three times a day to help with her fibromyalgia and to help with relaxing throughout the day, and she is she uses the Clonazepam to help her sleep.

## 2011-01-26 NOTE — Telephone Encounter (Signed)
We do not have lorazepam on our med list.  If she was getting from her rheumatologist, she needs to call them for refill

## 2011-03-22 ENCOUNTER — Encounter: Payer: Self-pay | Admitting: Internal Medicine

## 2011-03-23 NOTE — H&P (Signed)
[place]  Patient:    Paula Murphy, DETAMORE Visit Number: [id number] MRN: [id number]          Service Type: SUR Location: [id number] Attending Physician:  Danella Penton Dictated by:   Tanya Nones. Jeral Fruit, M.D. Admit Date:  03/13/2002                           History and Physical  CHIEF COMPLAINT: Ms. Aman is a lady who had been complaining of neck pain with radiation to the left arm associated with tingling sensation and weakness.  HISTORY OF PRESENT ILLNESS: She tells me the pain is so intense and sometimes when she is driving she has to stop because minimal movement is quite uncomfortable.  The pain goes to both shoulders but the left is more effected than the right one. She complains of numbness in the left little finger.  She was seen by an orthopedic surgeon, who felt that probably she had some shoulder problem but also the main problem was coming from the neck.  She was referred to another surgeon but she decided to come to see [place].  PAST MEDICAL HISTORY:  1. Cholecystectomy.  2. Knee surgery.  3. Hysterectomy.  SOCIAL HISTORY: Negative.  FAMILY HISTORY: Mother is 22 with diabetes, high blood pressure, Alzheimers, and abdominal aneurysm.  Father is 45 years old, with a heart bypass and lung cancer.  REVIEW OF SYSTEMS: Positive for high blood pressure, thyroid disease, and neck pain.  MEDICATIONS:  1. Accupril.  2. Levoxyl.  PHYSICAL EXAMINATION:  HEENT: Normal.  NECK: She is able to flex but extension and lateralization is quite painful. ______.  LUNGS: Clear.  CARDIAC: Heart sounds normal.  ABDOMEN: Normal.  EXTREMITIES: Normal pulses.  She has some tenderness in the left acromioclavicular joint and it is quite painful to her every time she tries to move her shoulder.  NEUROLOGIC: Mental status normal.  Cranial nerves normal.  Strength is 5/5 except that I can break easily both biceps and both triceps.   Normal deltoid. Reflexes 1+ at the level of biceps and triceps and 2+ in the lower extremities.  Sensation grossly normal.  LABORATORY DATA: The MRI showed that she has degenerative disk disease at the level of 5-6 and 6-7.  CLINICAL IMPRESSION:  1. Cervical spondylosis, worse at the level of 5-6 and 6-7.  2. Carpal tunnel syndrome.  3. Ulnar neuropathy.  4. Epicondylitis of the left elbow.  5. Tendinopathy of the left acromioclavicular joint.  PLAN: The patient wants to proceed with surgery.  The procedure will be anterior cervical diskectomy at the level of 5-6 and 6-7.  We are going to use [place] and a plate.  She knows about the risks such as infection, CSF leak, worsening of pain, paralysis, no improvement whatsoever, failure of the material we are using, stroke, and need for further surgery. Dictated by:   Tanya Nones. Jeral Fruit, M.D. Attending Physician:  Danella Penton DD:  03/13/02 TD:  03/14/02 Job: [id number]

## 2011-03-23 NOTE — Op Note (Signed)
[place]  Patient:    Paula Murphy, Paula Murphy Visit Number: [id number] MRN: [id number]          Service Type: SUR Location: [id number] Attending Physician:  Danella Penton Dictated by:   Tanya Nones. Jeral Fruit, M.D. Proc. Date: 03/13/02 Admit Date:  03/13/2002 Discharge Date: 03/15/2002                             Operative Report  PREOPERATIVE DIAGNOSIS:  C5-C6, C6-C7 spondylosis with a chronic radiculopathy.  POSTOPERATIVE DIAGNOSIS:  C5-C6, C6-C7 spondylosis with a chronic radiculopathy.  PROCEDURE:  Anterior C5-C6, C6-C7 diskectomy, decompression of the C6 and C7, nerve root, bone graft, plate from C5 to C7.  Microscope.  SURGEON:  Tanya Nones. Jeral Fruit, M.D.  ASSISTANT:  Danae Orleans. Venetia Maxon, M.D.  CLINICAL HISTORY:  [name] is a 65 year old lady, a patient of Dr. _______, who has been having some problems with her shoulder. Nevertheless, it was found by x-ray that she has a spondylosis between C5-C6 and C6-C7.  The patient was sent to one Dr. _______s partners but she decided to come to see [place].  Surgery was explained as well as the risks.  DESCRIPTION OF PROCEDURE:  The patient was take to the OR, and after intubation, the left side of the neck was prepped with Betadine.  Transverse incision was made through the skin and platysma down to the cervical spine. X-ray was take which showed we were, indeed, at the level at C5-C6. Immediately, we removed the anterior osteophyte at the level of C5-C6 and C6-C7.  We brought the microscope into the area, and we opened the anterior ligament and dorsal lamina.  Using the pituitary rongeurs, as well as the curette, we did a total diskectomy bilateral.  We went ahead and started drilling the end plate as well as the spondylosis in the midline and laterally. Using the 1 and 2-mm Kerrison punch, the foramen were opened with plenty of room for both C6 and C7 nerve root.  Having done this, we  drilled more of the end plate and two pieces of bone graft, 7 mm, were inserted at those levels.  This was followed by a plate using five screws.  Lateral C-spine showed good position of the graft and the plate.  From there on, the area was irrigated.  Investigation of the operative site was negative. Hemostasis was done with bipolar.  The wound was closed with Vicryl and Steri-Strips. Dictated by:   Tanya Nones. Jeral Fruit, M.D. Attending Physician:  Danella Penton DD:  03/13/02 TD:  03/16/02 Job: [phone] [id number]

## 2011-04-09 ENCOUNTER — Ambulatory Visit: Payer: Self-pay | Admitting: Internal Medicine

## 2011-04-17 ENCOUNTER — Other Ambulatory Visit: Payer: Self-pay | Admitting: Internal Medicine

## 2011-04-18 NOTE — Telephone Encounter (Signed)
Call placed to [facility] [phone], Rx refill called into pharmacy  Voicemail. Qty 30 one by mouth daily  with 3 refills.

## 2011-04-18 NOTE — Telephone Encounter (Signed)
May RF as previously prescribed, with 3 additional RF's.   Needs routine office f/u prior to running out of these RFs.  Thx--PM

## 2011-05-07 ENCOUNTER — Other Ambulatory Visit: Payer: Self-pay | Admitting: Internal Medicine

## 2011-05-07 NOTE — Telephone Encounter (Signed)
Patient is due for office visit

## 2011-10-18 ENCOUNTER — Other Ambulatory Visit: Payer: Self-pay | Admitting: Internal Medicine

## 2011-10-18 DIAGNOSIS — Z1231 Encounter for screening mammogram for malignant neoplasm of breast: Secondary | ICD-10-CM

## 2011-11-28 ENCOUNTER — Ambulatory Visit
Admission: RE | Admit: 2011-11-28 | Discharge: 2011-11-28 | Disposition: A | Discharge status: Home / Self Care | Payer: Medicare Other | Source: Ambulatory Visit | Attending: Internal Medicine | Admitting: Internal Medicine

## 2011-11-28 DIAGNOSIS — Z1231 Encounter for screening mammogram for malignant neoplasm of breast: Secondary | ICD-10-CM

## 2012-11-25 ENCOUNTER — Other Ambulatory Visit: Payer: Self-pay | Admitting: Internal Medicine

## 2012-11-25 DIAGNOSIS — Z1231 Encounter for screening mammogram for malignant neoplasm of breast: Secondary | ICD-10-CM

## 2012-12-23 ENCOUNTER — Ambulatory Visit: Payer: Medicare Other

## 2013-01-21 ENCOUNTER — Ambulatory Visit
Admission: RE | Admit: 2013-01-21 | Discharge: 2013-01-21 | Disposition: A | Discharge status: Home / Self Care | Payer: Medicare Other | Source: Ambulatory Visit | Attending: Internal Medicine | Admitting: Internal Medicine

## 2013-01-21 DIAGNOSIS — Z1231 Encounter for screening mammogram for malignant neoplasm of breast: Secondary | ICD-10-CM

## 2013-02-17 ENCOUNTER — Encounter (HOSPITAL_COMMUNITY): Payer: Self-pay | Admitting: Pharmacy Technician

## 2013-02-17 NOTE — Progress Notes (Signed)
Need orders please DOS 03/02/13 pt coming for PREOP 02/24/13 Thank You

## 2013-02-19 ENCOUNTER — Other Ambulatory Visit: Payer: Self-pay | Admitting: Orthopedic Surgery

## 2013-02-19 MED ORDER — BUPIVACAINE LIPOSOME 1.3 % IJ SUSP: 20.0000 mL | Freq: Once | INTRAMUSCULAR | Status: DC

## 2013-02-19 MED ORDER — DEXAMETHASONE SODIUM PHOSPHATE 10 MG/ML IJ SOLN: 10.0000 mg | Freq: Once | INTRAMUSCULAR | Status: DC

## 2013-02-19 NOTE — Progress Notes (Signed)
Preoperative surgical orders have been place into the Epic hospital system for Paula Murphy on 02/19/2013, 12:43 PM  by Patrica Duel for surgery on 03/02/2013.  Preop Total Knee orders including Experal, IV Tylenol, and IV Decadron as long as there are no contraindications to the above medications. Avel Peace, PA-C

## 2013-02-24 ENCOUNTER — Encounter (HOSPITAL_COMMUNITY)
Admission: RE | Admit: 2013-02-24 | Discharge: 2013-02-24 | Disposition: A | Discharge status: Home / Self Care | Payer: Medicare Other | Source: Ambulatory Visit | Attending: Orthopedic Surgery | Admitting: Orthopedic Surgery

## 2013-02-24 ENCOUNTER — Encounter (HOSPITAL_COMMUNITY): Payer: Self-pay

## 2013-02-24 HISTORY — DX: Other specified postprocedural states: R11.2

## 2013-02-24 HISTORY — DX: Unspecified osteoarthritis, unspecified site: M19.90

## 2013-02-24 HISTORY — DX: Other specified postprocedural states: Z98.890

## 2013-02-24 HISTORY — DX: Hypothyroidism, unspecified: E03.9

## 2013-02-24 HISTORY — DX: Unspecified macular degeneration: H35.30

## 2013-02-24 HISTORY — DX: Unspecified hemorrhoids: K64.9

## 2013-02-24 LAB — COMPREHENSIVE METABOLIC PANEL
ALT: 14 U/L (ref 0–35)
AST: 19 U/L (ref 0–37)
Albumin: 3.5 g/dL (ref 3.5–5.2)
CO2: 28 mEq/L (ref 19–32)
Chloride: 103 mEq/L (ref 96–112)
Creatinine, Ser: 0.68 mg/dL (ref 0.50–1.10)
GFR calc non Af Amer: 89 mL/min — ABNORMAL LOW (ref >90)
Sodium: 137 mEq/L (ref 135–145)
Total Bilirubin: 0.3 mg/dL (ref 0.3–1.2)

## 2013-02-24 LAB — URINE MICROSCOPIC-ADD ON

## 2013-02-24 LAB — CBC
Platelets: 227 10*3/uL (ref 150–400)
RBC: 4.3 MIL/uL (ref 3.87–5.11)
RDW: 12.8 % (ref 11.5–15.5)
WBC: 4.7 10*3/uL (ref 4.0–10.5)

## 2013-02-24 LAB — URINALYSIS, ROUTINE W REFLEX MICROSCOPIC
Glucose, UA: NEGATIVE mg/dL
Ketones, ur: NEGATIVE mg/dL
Nitrite: NEGATIVE
Protein, ur: NEGATIVE mg/dL
pH: 5.5 (ref 5.0–8.0)

## 2013-02-24 LAB — PROTIME-INR: INR: 0.99 (ref 0.00–1.49)

## 2013-02-24 LAB — SURGICAL PCR SCREEN
MRSA, PCR: NEGATIVE
Staphylococcus aureus: NEGATIVE

## 2013-02-24 LAB — APTT: aPTT: 27 seconds (ref 24–37)

## 2013-02-24 NOTE — Pre-Procedure Instructions (Signed)
EKG AND CXR NOT NEEDED PREOP - PER ANESTHESIOLOGIST'S GUIDELINES. 

## 2013-02-24 NOTE — Patient Instructions (Signed)
YOUR SURGERY IS SCHEDULED AT [facility]  ON:   Monday  4/28  REPORT TO [place] SHORT STAY CENTER AT: 6:00 AM      PHONE # FOR SHORT STAY IS [phone]  DO NOT EAT OR DRINK ANYTHING AFTER MIDNIGHT THE NIGHT BEFORE YOUR SURGERY.  YOU MAY BRUSH YOUR TEETH, RINSE OUT YOUR MOUTH--BUT NO WATER, NO FOOD, NO CHEWING GUM, NO MINTS, NO CANDIES, NO CHEWING TOBACCO.  PLEASE TAKE THE FOLLOWING MEDICATIONS THE AM OF YOUR SURGERY WITH A FEW SIPS OF WATER:  LEVOTHYROXINE   DO NOT BRING VALUABLES, MONEY, CREDIT CARDS.  DO NOT WEAR JEWELRY, MAKE-UP, NAIL POLISH AND NO METAL PINS OR CLIPS IN YOUR HAIR. CONTACT LENS, DENTURES / PARTIALS, GLASSES SHOULD NOT BE WORN TO SURGERY AND IN MOST CASES-HEARING AIDS WILL NEED TO BE REMOVED.  BRING YOUR GLASSES CASE, ANY EQUIPMENT NEEDED FOR YOUR CONTACT LENS. FOR PATIENTS ADMITTED TO THE HOSPITAL--CHECK OUT TIME THE DAY OF DISCHARGE IS 11:00 AM.  ALL INPATIENT ROOMS ARE PRIVATE - WITH BATHROOM, TELEPHONE, TELEVISION AND WIFI INTERNET.                             PLEASE READ OVER ANY  FACT SHEETS THAT YOU WERE GIVEN: MRSA INFORMATION, BLOOD TRANSFUSION INFORMATION, INCENTIVE SPIROMETER INFORMATION. FAILURE TO FOLLOW THESE INSTRUCTIONS MAY RESULT IN THE CANCELLATION OF YOUR SURGERY.   PATIENT SIGNATURE_________________________________

## 2013-03-01 ENCOUNTER — Other Ambulatory Visit: Payer: Self-pay | Admitting: Orthopedic Surgery

## 2013-03-01 MED ORDER — TRANEXAMIC ACID 100 MG/ML IV SOLN: 1000.0000 mg | INTRAVENOUS | Status: DC

## 2013-03-01 NOTE — H&P (Signed)
Paula Murphy  DOB: 11-12-1945 Married / Language: English / Race: White Female  Date of Admission:  03/02/2013  Chief Complaint:  Right Knee Pain  History of Present Illness The patient is a 67 year old female who comes in for a preoperative History and Physical. The patient is scheduled for a right total knee arthroplasty to be performed by Dr. Gus Rankin. Aluisio, MD at [facility] on 03/02/2013. The patient is a 67 year old female who presents with knee complaints. The patient is seen in referral from her Rheumatologist. The patient reports right knee symptoms including: pain which began year(s) ago without any known injury. Prior to being seen today the patient was previously evaluated by a collegue. Past treatment for this problem has included intra-articular injection of corticosteroids (as well as viscosupplementation). Note for "Knee pain": She had a knee scope done by Dr. Thomasena Edis in 2000. She said that did help for a short time. She has had cortisone injections over the past 14 years, as well as visco this past January. Her Rheumatologist did the visco series for her. [name] comes in for evaluation of her right knee. She has had problems with her right knee for many years. She had a previous arthroscopy and debridement in the past by Dr. Darrelyn Hillock. She states over the years she has multiple cortisone shots and a round of visco supplementation by her rheumatologist, Dr. Corliss Skains, who sees her for her fibromyalgia. At this point in time, she has now failed cortisone injections and visco supplementation. She has pain with activity. She had pain with getting up and down out of a chair and getting up and down steps. She also has pain that awakens her at night when she moves in certain ways. Due to her continued symptoms, she comes in and is ready to proceed with total knee arthroplasty. They have been treated conservatively in the past for the above stated problem and despite  conservative measures, they continue to have progressive pain and severe functional limitations and dysfunction. They have failed non-operative management including home exercise, medications, and injections. It is felt that they would benefit from undergoing total joint replacement. Risks and benefits of the procedure have been discussed with the patient and they elect to proceed with surgery. There are no active contraindications to surgery such as ongoing infection or rapidly progressive neurological disease.   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 Sick Hydrocodone w/APAP *ANALGESICS - OPIOID*. Sickness   Family History Osteoarthritis. mother Rheumatoid Arthritis. father Heart Disease. father Cancer. mother and father Chronic Obstructive Lung Disease. mother and father Diabetes Mellitus. father Father. Deceased. age 24 Mother. Deceased. age 33   Social History Drug/Alcohol Rehab (Currently). no Drug/Alcohol Rehab (Previously). no Exercise. Exercises weekly; does gym / weights Current work status. retired Alcohol use. current drinker; drinks beer and wine; only occasionally per week Children. 1 Illicit drug use. no Tobacco use. never smoker Tobacco / smoke exposure. no Marital status. married Number of flights of stairs before winded. 2-3 Pain Contract. no Advance Directives. Living Will Post-Surgical Plans. Home   Medication History Venlafaxine HCl ER (37.5MG  Capsule ER 24HR, Oral daily) Active. Topiramate (50MG  Tablet, Oral three times daily) Active. Lyrica (75MG  Capsule, Oral daily) Active. Vitamin D (2000UNIT Tablet, Oral daily) Active. Aspirin (81MG  Tablet, Oral daily) Active. ALPRAZolam (0.5MG  Tablet, Oral daily as needed) Active. (prn) Levothyroxine Sodium ( Tablet, Oral daily) Active. ICaps (2 Oral daily) Active. TraMADol HCl (50MG  Tablet, 2  Oral daily) Active. Ketamine  10%/Ketoprofen 10%/Gabapentin 4%/Cyclobenzaprine 2%/Lidocaine 5% ( External four times daily, as needed) Active. (prn; gabapentin/lidocaine topical 2%)   Past Surgical History Gallbladder Surgery. Date: 74. laporoscopic Hysterectomy. Date: 69. complete (non-cancerous) Neck Disc Surgery. Date: 2004. Foot Surgery. Date: 2012. left; Bunion Arthroscopy of Knee. Date: 93. right Cataract Surgery. bilateral Tonsillectomy. 1950's   Medical History Hypothyroidism Hypercholesterolemia Fibromyalgia Macular Degeneration Cataract. Bilateral Cataract Surgery Hemorrhoids   Review of Systems General:Not Present- Chills, Fever, Night Sweats, Fatigue, Weight Gain, Weight Loss and Memory Loss. Skin:Not Present- Hives, Itching, Rash, Eczema and Lesions. HEENT:Not Present- Tinnitus, Headache, Double Vision, Visual Loss, Hearing Loss and Dentures. Respiratory:Not Present- Shortness of breath with exertion, Shortness of breath at rest, Allergies, Coughing up blood and Chronic Cough. Cardiovascular:Not Present- Chest Pain, Racing/skipping heartbeats, Difficulty Breathing Lying Down, Murmur, Swelling and Palpitations. Gastrointestinal:Not Present- Bloody Stool, Heartburn, Abdominal Pain, Vomiting, Nausea, Constipation, Diarrhea, Difficulty Swallowing, Jaundice and Loss of appetitie. Female Genitourinary:Not Present- Blood in Urine, Urinary frequency, Weak urinary stream, Discharge, Flank Pain, Incontinence, Painful Urination, Urgency, Urinary Retention and Urinating at Night. Musculoskeletal:Present- Muscle Pain and Joint Pain. Not Present- Muscle Weakness, Joint Swelling, Back Pain, Morning Stiffness and Spasms. Neurological:Not Present- Tremor, Dizziness, Blackout spells, Paralysis, Difficulty with balance and Weakness. Psychiatric:Not Present- Insomnia.   Vitals 02/19/2013 3:04 PM Pulse: 84 (Regular) Resp.: 12 (Unlabored) BP: 132/82 (Sitting, Right Arm,  Standard)    Physical Exam The physical exam findings are as follows:  Note: Patient is a 67 year old female with continued right knee pain. Patient is accompanied today by her daughter Lady Gary.   General Mental Status - Alert, cooperative and good historian. General Appearance- pleasant. Not in acute distress. Orientation- Oriented X3. Build & Nutrition- Well nourished and Well developed.   Head and Neck Head- normocephalic, atraumatic . Neck Global Assessment- supple. no bruit auscultated on the right and no bruit auscultated on the left.   Eye Vision- Wears corrective lenses. Pupil- Bilateral- Regular and Round. Motion- Bilateral- EOMI.   Chest and Lung Exam Auscultation: Breath sounds:- clear at anterior chest wall and - clear at posterior chest wall. Adventitious sounds:- No Adventitious sounds.   Cardiovascular Auscultation:Rhythm- Regular rate and rhythm. Heart Sounds- S1 WNL and S2 WNL. Murmurs & Other Heart Sounds:Auscultation of the heart reveals - No Murmurs.   Abdomen Palpation/Percussion:Tenderness- Abdomen is non-tender to palpation. Rigidity (guarding)- Abdomen is soft. Auscultation:Auscultation of the abdomen reveals - Bowel sounds normal.   Female Genitourinary  Not done, not pertinent to present illness  Musculoskeletal Her knee shows no effusion. She has a varus deformity. She has crepitation throughout the range of motion. There is a positive patellar inhibition test. There is pain on McMurray's. No clunk and no ligamentous laxity.  RADIOGRAPHS: X-rays reveal bone on bone medial compartment and patellofemoral changes as well as moderate lateral compartment degeneration.  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 Lequita Halt.  Plan is to go home follwoing surgery.  Signed electronically by Roberts Gaudy, PA-C

## 2013-03-02 ENCOUNTER — Encounter (HOSPITAL_COMMUNITY)
Admission: RE | Disposition: A | Discharge status: Home Health | Payer: Self-pay | Source: Ambulatory Visit | Attending: Orthopedic Surgery

## 2013-03-02 ENCOUNTER — Encounter (HOSPITAL_COMMUNITY): Payer: Self-pay | Admitting: Anesthesiology

## 2013-03-02 ENCOUNTER — Inpatient Hospital Stay (HOSPITAL_COMMUNITY)
Admission: RE | Admit: 2013-03-02 | Discharge: 2013-03-04 | DRG: 470 | Disposition: A | Discharge status: Home Health | Payer: Medicare Other | Source: Ambulatory Visit | Attending: Orthopedic Surgery | Admitting: Orthopedic Surgery

## 2013-03-02 ENCOUNTER — Inpatient Hospital Stay (HOSPITAL_COMMUNITY): Payer: Medicare Other | Admitting: Anesthesiology

## 2013-03-02 ENCOUNTER — Encounter (HOSPITAL_COMMUNITY): Payer: Self-pay | Admitting: *Deleted

## 2013-03-02 DIAGNOSIS — H3530 Unspecified macular degeneration: Secondary | ICD-10-CM | POA: Diagnosis present

## 2013-03-02 DIAGNOSIS — M179 Osteoarthritis of knee, unspecified: Secondary | ICD-10-CM | POA: Diagnosis present

## 2013-03-02 DIAGNOSIS — Z7982 Long term (current) use of aspirin: Secondary | ICD-10-CM

## 2013-03-02 DIAGNOSIS — F3289 Other specified depressive episodes: Secondary | ICD-10-CM | POA: Diagnosis present

## 2013-03-02 DIAGNOSIS — M1710 Unilateral primary osteoarthritis, unspecified knee: Principal | ICD-10-CM | POA: Diagnosis present

## 2013-03-02 DIAGNOSIS — Z01812 Encounter for preprocedural laboratory examination: Secondary | ICD-10-CM

## 2013-03-02 DIAGNOSIS — F329 Major depressive disorder, single episode, unspecified: Secondary | ICD-10-CM | POA: Diagnosis present

## 2013-03-02 DIAGNOSIS — E039 Hypothyroidism, unspecified: Secondary | ICD-10-CM | POA: Diagnosis present

## 2013-03-02 DIAGNOSIS — E7800 Pure hypercholesterolemia, unspecified: Secondary | ICD-10-CM | POA: Diagnosis present

## 2013-03-02 DIAGNOSIS — Z79899 Other long term (current) drug therapy: Secondary | ICD-10-CM

## 2013-03-02 DIAGNOSIS — IMO0001 Reserved for inherently not codable concepts without codable children: Secondary | ICD-10-CM | POA: Diagnosis present

## 2013-03-02 HISTORY — PX: TOTAL KNEE ARTHROPLASTY: SHX125

## 2013-03-02 LAB — TYPE AND SCREEN: ABO/RH(D): A POS

## 2013-03-02 SURGERY — ARTHROPLASTY, KNEE, TOTAL
Anesthesia: Spinal | Site: Knee | Laterality: Right | Wound class: Clean

## 2013-03-02 MED ORDER — ACETAMINOPHEN 325 MG PO TABS
650.0000 mg | ORAL_TABLET | Freq: Four times a day (QID) | ORAL | Status: DC | PRN
  Administered 2013-03-03 (×2): 650 mg via ORAL
  Filled 2013-03-02 (×2): qty 2

## 2013-03-02 MED ORDER — VALACYCLOVIR HCL 500 MG PO TABS: 2000.0000 mg | ORAL_TABLET | ORAL | Status: DC | PRN

## 2013-03-02 MED ORDER — FLEET ENEMA 7-19 GM/118ML RE ENEM: 1.0000 | ENEMA | Freq: Once | RECTAL | Status: AC | PRN

## 2013-03-02 MED ORDER — METOCLOPRAMIDE HCL 10 MG PO TABS: 5.0000 mg | ORAL_TABLET | Freq: Three times a day (TID) | ORAL | Status: DC | PRN

## 2013-03-02 MED ORDER — TOPIRAMATE 25 MG PO TABS
150.0000 mg | ORAL_TABLET | Freq: Every day | ORAL | Status: DC
  Administered 2013-03-02 – 2013-03-03 (×2): 150 mg via ORAL
  Filled 2013-03-02 (×3): qty 2

## 2013-03-02 MED ORDER — DEXAMETHASONE SODIUM PHOSPHATE 10 MG/ML IJ SOLN
10.0000 mg | Freq: Every day | INTRAMUSCULAR | Status: AC
  Filled 2013-03-02: qty 1

## 2013-03-02 MED ORDER — DIPHENHYDRAMINE HCL 12.5 MG/5ML PO ELIX: 12.5000 mg | ORAL_SOLUTION | ORAL | Status: DC | PRN

## 2013-03-02 MED ORDER — BUPIVACAINE IN DEXTROSE 0.75-8.25 % IT SOLN
INTRATHECAL | Status: DC | PRN
  Administered 2013-03-02: 1.5 mL via INTRATHECAL

## 2013-03-02 MED ORDER — TRAMADOL HCL 50 MG PO TABS
50.0000 mg | ORAL_TABLET | Freq: Four times a day (QID) | ORAL | Status: DC | PRN
  Administered 2013-03-03 (×2): 100 mg via ORAL
  Filled 2013-03-02 (×2): qty 2

## 2013-03-02 MED ORDER — MIDAZOLAM HCL 5 MG/5ML IJ SOLN
INTRAMUSCULAR | Status: DC | PRN
  Administered 2013-03-02: 2 mg via INTRAVENOUS

## 2013-03-02 MED ORDER — DOCUSATE SODIUM 100 MG PO CAPS
100.0000 mg | ORAL_CAPSULE | Freq: Two times a day (BID) | ORAL | Status: DC
  Administered 2013-03-02 – 2013-03-04 (×4): 100 mg via ORAL

## 2013-03-02 MED ORDER — FENTANYL CITRATE 0.05 MG/ML IJ SOLN
INTRAMUSCULAR | Status: DC | PRN
  Administered 2013-03-02: 100 ug via INTRAVENOUS

## 2013-03-02 MED ORDER — BUPIVACAINE LIPOSOME 1.3 % IJ SUSP
INTRAMUSCULAR | Status: DC | PRN
  Administered 2013-03-02: 09:00:00

## 2013-03-02 MED ORDER — CEFAZOLIN SODIUM-DEXTROSE 2-3 GM-% IV SOLR
2.0000 g | INTRAVENOUS | Status: AC
  Administered 2013-03-02: 2 g via INTRAVENOUS

## 2013-03-02 MED ORDER — VENLAFAXINE HCL 37.5 MG PO TABS
37.5000 mg | ORAL_TABLET | Freq: Every day | ORAL | Status: DC
  Administered 2013-03-02 – 2013-03-03 (×2): 37.5 mg via ORAL
  Filled 2013-03-02 (×3): qty 1

## 2013-03-02 MED ORDER — ACETAMINOPHEN 10 MG/ML IV SOLN: 1000.0000 mg | Freq: Once | INTRAVENOUS | Status: DC | PRN

## 2013-03-02 MED ORDER — RIVAROXABAN 10 MG PO TABS
10.0000 mg | ORAL_TABLET | Freq: Every day | ORAL | Status: DC
  Administered 2013-03-03 – 2013-03-04 (×2): 10 mg via ORAL
  Filled 2013-03-02 (×3): qty 1

## 2013-03-02 MED ORDER — METHOCARBAMOL 500 MG PO TABS
500.0000 mg | ORAL_TABLET | Freq: Four times a day (QID) | ORAL | Status: DC | PRN
  Administered 2013-03-03 – 2013-03-04 (×5): 500 mg via ORAL
  Filled 2013-03-02 (×5): qty 1

## 2013-03-02 MED ORDER — BUPIVACAINE HCL (PF) 0.25 % IJ SOLN
INTRAMUSCULAR | Status: DC | PRN
  Administered 2013-03-02: 20 mL

## 2013-03-02 MED ORDER — HYDROMORPHONE HCL PF 1 MG/ML IJ SOLN: 0.2500 mg | INTRAMUSCULAR | Status: DC | PRN

## 2013-03-02 MED ORDER — CEFAZOLIN SODIUM 1-5 GM-% IV SOLN
1.0000 g | Freq: Four times a day (QID) | INTRAVENOUS | Status: AC
  Administered 2013-03-02 (×2): 1 g via INTRAVENOUS
  Filled 2013-03-02 (×3): qty 50

## 2013-03-02 MED ORDER — ONDANSETRON HCL 4 MG/2ML IJ SOLN: 4.0000 mg | Freq: Four times a day (QID) | INTRAMUSCULAR | Status: DC | PRN

## 2013-03-02 MED ORDER — ACETAMINOPHEN 10 MG/ML IV SOLN
1000.0000 mg | Freq: Once | INTRAVENOUS | Status: AC
  Administered 2013-03-02: 1000 mg via INTRAVENOUS

## 2013-03-02 MED ORDER — PROMETHAZINE HCL 25 MG/ML IJ SOLN
6.2500 mg | INTRAMUSCULAR | Status: DC | PRN
  Administered 2013-03-02: 6.25 mg via INTRAVENOUS

## 2013-03-02 MED ORDER — BUPIVACAINE LIPOSOME 1.3 % IJ SUSP
20.0000 mL | Freq: Once | INTRAMUSCULAR | Status: DC
  Filled 2013-03-02: qty 20

## 2013-03-02 MED ORDER — PROPOFOL 10 MG/ML IV EMUL
INTRAVENOUS | Status: DC | PRN
  Administered 2013-03-02: 100 ug/kg/min via INTRAVENOUS

## 2013-03-02 MED ORDER — PREGABALIN 75 MG PO CAPS
75.0000 mg | ORAL_CAPSULE | Freq: Every day | ORAL | Status: DC
  Administered 2013-03-02: 75 mg via ORAL
  Filled 2013-03-02 (×2): qty 1

## 2013-03-02 MED ORDER — ALPRAZOLAM 0.5 MG PO TABS
0.5000 mg | ORAL_TABLET | Freq: Every evening | ORAL | Status: DC | PRN
  Administered 2013-03-03: 0.5 mg via ORAL
  Filled 2013-03-02: qty 1

## 2013-03-02 MED ORDER — HYDROMORPHONE HCL PF 1 MG/ML IJ SOLN
0.5000 mg | INTRAMUSCULAR | Status: DC | PRN
  Administered 2013-03-02 – 2013-03-03 (×4): 1 mg via INTRAVENOUS
  Filled 2013-03-02 (×4): qty 1

## 2013-03-02 MED ORDER — LEVOTHYROXINE SODIUM 137 MCG PO TABS
137.0000 ug | ORAL_TABLET | Freq: Every morning | ORAL | Status: DC
  Administered 2013-03-03 – 2013-03-04 (×2): 137 ug via ORAL
  Filled 2013-03-02 (×2): qty 1

## 2013-03-02 MED ORDER — KCL IN DEXTROSE-NACL 20-5-0.9 MEQ/L-%-% IV SOLN
INTRAVENOUS | Status: DC
  Administered 2013-03-02: 17:00:00 via INTRAVENOUS
  Filled 2013-03-02 (×4): qty 1000

## 2013-03-02 MED ORDER — MEPERIDINE HCL 50 MG/ML IJ SOLN: 6.2500 mg | INTRAMUSCULAR | Status: DC | PRN

## 2013-03-02 MED ORDER — EPHEDRINE SULFATE 50 MG/ML IJ SOLN
INTRAMUSCULAR | Status: DC | PRN
  Administered 2013-03-02: 10 mg via INTRAVENOUS
  Administered 2013-03-02: 5 mg via INTRAVENOUS

## 2013-03-02 MED ORDER — METOCLOPRAMIDE HCL 5 MG/ML IJ SOLN: 5.0000 mg | Freq: Three times a day (TID) | INTRAMUSCULAR | Status: DC | PRN

## 2013-03-02 MED ORDER — ONDANSETRON HCL 4 MG/2ML IJ SOLN
INTRAMUSCULAR | Status: DC | PRN
  Administered 2013-03-02: 4 mg via INTRAVENOUS

## 2013-03-02 MED ORDER — LACTATED RINGERS IV SOLN: INTRAVENOUS | Status: DC

## 2013-03-02 MED ORDER — LACTATED RINGERS IV SOLN
INTRAVENOUS | Status: DC | PRN
  Administered 2013-03-02 (×2): via INTRAVENOUS

## 2013-03-02 MED ORDER — METHOCARBAMOL 100 MG/ML IJ SOLN
500.0000 mg | Freq: Four times a day (QID) | INTRAVENOUS | Status: DC | PRN
  Filled 2013-03-02: qty 5

## 2013-03-02 MED ORDER — PHENOL 1.4 % MT LIQD: 1.0000 | OROMUCOSAL | Status: DC | PRN

## 2013-03-02 MED ORDER — ACETAMINOPHEN 650 MG RE SUPP: 650.0000 mg | Freq: Four times a day (QID) | RECTAL | Status: DC | PRN

## 2013-03-02 MED ORDER — ACETAMINOPHEN 10 MG/ML IV SOLN: INTRAVENOUS | Status: DC | PRN

## 2013-03-02 MED ORDER — ONDANSETRON HCL 4 MG PO TABS: 4.0000 mg | ORAL_TABLET | Freq: Four times a day (QID) | ORAL | Status: DC | PRN

## 2013-03-02 MED ORDER — ACETAMINOPHEN 10 MG/ML IV SOLN
1000.0000 mg | Freq: Four times a day (QID) | INTRAVENOUS | Status: AC
  Administered 2013-03-02 – 2013-03-03 (×4): 1000 mg via INTRAVENOUS
  Filled 2013-03-02 (×8): qty 100

## 2013-03-02 MED ORDER — SODIUM CHLORIDE 0.9 % IV SOLN
1000.0000 mg | INTRAVENOUS | Status: AC
  Administered 2013-03-02: 1000 mg via INTRAVENOUS
  Filled 2013-03-02: qty 10

## 2013-03-02 MED ORDER — CHLORHEXIDINE GLUCONATE 4 % EX LIQD
60.0000 mL | Freq: Once | CUTANEOUS | Status: DC
  Filled 2013-03-02: qty 60

## 2013-03-02 MED ORDER — DEXAMETHASONE 6 MG PO TABS
10.0000 mg | ORAL_TABLET | Freq: Every day | ORAL | Status: AC
  Administered 2013-03-03: 10 mg via ORAL
  Filled 2013-03-02: qty 1

## 2013-03-02 MED ORDER — PROSIGHT PO TABS
1.0000 | ORAL_TABLET | Freq: Every day | ORAL | Status: DC
  Administered 2013-03-03 (×2): 1 via ORAL
  Filled 2013-03-02 (×3): qty 1

## 2013-03-02 MED ORDER — BISACODYL 10 MG RE SUPP: 10.0000 mg | Freq: Every day | RECTAL | Status: DC | PRN

## 2013-03-02 MED ORDER — SODIUM CHLORIDE 0.9 % IV SOLN: INTRAVENOUS | Status: DC

## 2013-03-02 MED ORDER — MENTHOL 3 MG MT LOZG: 1.0000 | LOZENGE | OROMUCOSAL | Status: DC | PRN

## 2013-03-02 MED ORDER — HYDROMORPHONE HCL 2 MG PO TABS
2.0000 mg | ORAL_TABLET | ORAL | Status: DC | PRN
  Administered 2013-03-03 (×2): 4 mg via ORAL
  Administered 2013-03-03 (×2): 2 mg via ORAL
  Administered 2013-03-03 – 2013-03-04 (×4): 4 mg via ORAL
  Filled 2013-03-02 (×4): qty 2
  Filled 2013-03-02: qty 1
  Filled 2013-03-02 (×3): qty 2

## 2013-03-02 MED ORDER — 0.9 % SODIUM CHLORIDE (POUR BTL) OPTIME
TOPICAL | Status: DC | PRN
  Administered 2013-03-02: 1000 mL

## 2013-03-02 MED ORDER — POLYETHYLENE GLYCOL 3350 17 G PO PACK
17.0000 g | PACK | Freq: Every day | ORAL | Status: DC | PRN
  Administered 2013-03-03: 17 g via ORAL

## 2013-03-02 MED ORDER — SODIUM CHLORIDE 0.9 % IR SOLN
Status: DC | PRN
  Administered 2013-03-02: 1000 mL

## 2013-03-02 SURGICAL SUPPLY — 56 items
BAG SPEC THK2 15X12 ZIP CLS (MISCELLANEOUS) ×1
BAG ZIPLOCK 12X15 (MISCELLANEOUS) ×2 IMPLANT
BANDAGE ELASTIC 6 VELCRO ST LF (GAUZE/BANDAGES/DRESSINGS) ×2 IMPLANT
BANDAGE ESMARK 6X9 LF (GAUZE/BANDAGES/DRESSINGS) ×1 IMPLANT
BLADE SAG 18X100X1.27 (BLADE) ×2 IMPLANT
BLADE SAW SGTL 11.0X1.19X90.0M (BLADE) ×2 IMPLANT
BNDG CMPR 9X6 STRL LF SNTH (GAUZE/BANDAGES/DRESSINGS) ×1
BNDG ESMARK 6X9 LF (GAUZE/BANDAGES/DRESSINGS) ×2
BOWL SMART MIX CTS (DISPOSABLE) ×2 IMPLANT
CEMENT HV SMART SET (Cement) ×4 IMPLANT
CLOTH BEACON ORANGE TIMEOUT ST (SAFETY) ×2 IMPLANT
CUFF TOURN SGL QUICK 34 (TOURNIQUET CUFF) ×2
CUFF TRNQT CYL 34X4X40X1 (TOURNIQUET CUFF) ×1 IMPLANT
DECANTER SPIKE VIAL GLASS SM (MISCELLANEOUS) ×2 IMPLANT
DRAPE EXTREMITY T 121X128X90 (DRAPE) ×2 IMPLANT
DRAPE POUCH INSTRU U-SHP 10X18 (DRAPES) ×2 IMPLANT
DRAPE U-SHAPE 47X51 STRL (DRAPES) ×2 IMPLANT
DRSG ADAPTIC 3X8 NADH LF (GAUZE/BANDAGES/DRESSINGS) ×2 IMPLANT
DRSG PAD ABDOMINAL 8X10 ST (GAUZE/BANDAGES/DRESSINGS) ×1 IMPLANT
DURAPREP 26ML APPLICATOR (WOUND CARE) ×2 IMPLANT
ELECT REM PT RETURN 9FT ADLT (ELECTROSURGICAL) ×2
ELECTRODE REM PT RTRN 9FT ADLT (ELECTROSURGICAL) ×1 IMPLANT
EVACUATOR 1/8 PVC DRAIN (DRAIN) ×2 IMPLANT
FACESHIELD LNG OPTICON STERILE (SAFETY) ×10 IMPLANT
GLOVE BIO SURGEON STRL SZ7.5 (GLOVE) ×2 IMPLANT
GLOVE BIO SURGEON STRL SZ8 (GLOVE) ×2 IMPLANT
GLOVE BIOGEL PI IND STRL 8 (GLOVE) ×2 IMPLANT
GLOVE BIOGEL PI INDICATOR 8 (GLOVE) ×2
GLOVE SURG SS PI 6.5 STRL IVOR (GLOVE) ×4 IMPLANT
GOWN STRL NON-REIN LRG LVL3 (GOWN DISPOSABLE) ×5 IMPLANT
GOWN STRL REIN XL XLG (GOWN DISPOSABLE) ×2 IMPLANT
HANDPIECE INTERPULSE COAX TIP (DISPOSABLE) ×2
IMMOBILIZER KNEE 20 (SOFTGOODS) ×2
IMMOBILIZER KNEE 20 THIGH 36 (SOFTGOODS) ×1 IMPLANT
KIT BASIN OR (CUSTOM PROCEDURE TRAY) ×2 IMPLANT
MANIFOLD NEPTUNE II (INSTRUMENTS) ×2 IMPLANT
NDL SAFETY ECLIPSE 18X1.5 (NEEDLE) ×1 IMPLANT
NEEDLE HYPO 18GX1.5 SHARP (NEEDLE) ×2
NS IRRIG 1000ML POUR BTL (IV SOLUTION) ×2 IMPLANT
PACK TOTAL JOINT (CUSTOM PROCEDURE TRAY) ×2 IMPLANT
PAD ABD 7.5X8 STRL (GAUZE/BANDAGES/DRESSINGS) ×2 IMPLANT
PADDING CAST COTTON 6X4 STRL (CAST SUPPLIES) ×5 IMPLANT
POSITIONER SURGICAL ARM (MISCELLANEOUS) ×2 IMPLANT
SET HNDPC FAN SPRY TIP SCT (DISPOSABLE) ×1 IMPLANT
SPONGE GAUZE 4X4 12PLY (GAUZE/BANDAGES/DRESSINGS) ×2 IMPLANT
STRIP CLOSURE SKIN 1/2X4 (GAUZE/BANDAGES/DRESSINGS) ×4 IMPLANT
SUCTION FRAZIER 12FR DISP (SUCTIONS) ×2 IMPLANT
SUT MNCRL AB 4-0 PS2 18 (SUTURE) ×2 IMPLANT
SUT VIC AB 2-0 CT1 27 (SUTURE) ×6
SUT VIC AB 2-0 CT1 TAPERPNT 27 (SUTURE) ×3 IMPLANT
SUT VLOC 180 0 24IN GS25 (SUTURE) ×2 IMPLANT
SYR 50ML LL SCALE MARK (SYRINGE) ×2 IMPLANT
TOWEL OR 17X26 10 PK STRL BLUE (TOWEL DISPOSABLE) ×4 IMPLANT
TRAY FOLEY CATH 14FRSI W/METER (CATHETERS) ×2 IMPLANT
WATER STERILE IRR 1500ML POUR (IV SOLUTION) ×3 IMPLANT
WRAP KNEE MAXI GEL POST OP (GAUZE/BANDAGES/DRESSINGS) ×3 IMPLANT

## 2013-03-02 NOTE — Anesthesia Procedure Notes (Signed)
Spinal  Patient location during procedure: OR End time: 03/02/2013 8:22 AM Staffing CRNA/Resident: Enriqueta Shutter Performed by: resident/CRNA  Preanesthetic Checklist Completed: patient identified, site marked, surgical consent, pre-op evaluation, timeout performed, IV checked, risks and benefits discussed and monitors and equipment checked Spinal Block Patient position: sitting Prep: Betadine Patient monitoring: heart rate, continuous pulse ox and blood pressure Approach: midline Location: L2-3 Injection technique: single-shot Needle Needle type: Sprotte  Needle gauge: 24 G Needle length: 9 cm Assessment Sensory level: T6 Additional Notes Expiration date of kit checked and confirmed. Patient tolerated procedure well, without complications.

## 2013-03-02 NOTE — Progress Notes (Signed)
Utilization review completed.  

## 2013-03-02 NOTE — Transfer of Care (Signed)
Immediate Anesthesia Transfer of Care Note  Patient: Paula Murphy  Procedure(s) Performed: Procedure(s): RIGHT TOTAL KNEE ARTHROPLASTY (Right)  Patient Location: PACU  Anesthesia Type:Regional  Level of Consciousness: awake, alert  and oriented  Airway & Oxygen Therapy: Patient Spontanous Breathing and Patient connected to face mask oxygen  Post-op Assessment: Report given to PACU RN and Post -op Vital signs reviewed and stable  Post vital signs: Reviewed and stable  Complications: No apparent anesthesia complications

## 2013-03-02 NOTE — Interval H&P Note (Signed)
History and Physical Interval Note:  03/02/2013 6:57 AM  Paula Murphy  has presented today for surgery, with the diagnosis of osteoarthritis of the right knee  The various methods of treatment have been discussed with the patient and family. After consideration of risks, benefits and other options for treatment, the patient has consented to  Procedure(s): RIGHT TOTAL KNEE ARTHROPLASTY (Right) as a surgical intervention .  The patient's history has been reviewed, patient examined, no change in status, stable for surgery.  I have reviewed the patient's chart and labs.  Questions were answered to the patient's satisfaction.     Loanne Drilling

## 2013-03-02 NOTE — Anesthesia Postprocedure Evaluation (Signed)
Anesthesia Post Note  Patient: Paula Murphy  Procedure(s) Performed: Procedure(s) (LRB): RIGHT TOTAL KNEE ARTHROPLASTY (Right)  Anesthesia type: Spinal  Patient location: PACU  Post pain: Pain level controlled  Post assessment: Post-op Vital signs reviewed  Last Vitals: BP 121/74  Pulse 66  Temp(Src) 36.5 C (Oral)  Resp 16  SpO2 100%  Post vital signs: Reviewed  Level of consciousness: sedated  Complications: No apparent anesthesia complications

## 2013-03-02 NOTE — Progress Notes (Signed)
Ortho tech in to apply CPM right knee

## 2013-03-02 NOTE — Evaluation (Signed)
Physical Therapy Evaluation Patient Details Name: Paula Murphy MRN: [id number] DOB: [birth year] Today's Date: 03/02/2013 Time: 4098-1191 PT Time Calculation (min): 21 min  PT Assessment / Plan / Recommendation Clinical Impression  67 yo female s/p R TKA. On eval, pt required Min assist for mobility-able to ambulate ~50 feet with RW. Anticipate pt will progress well during stay. Recommend HHPT, RW.     PT Assessment  Patient needs continued PT services    Follow Up Recommendations  Home health PT    Does the patient have the potential to tolerate intense rehabilitation      Barriers to Discharge        Equipment Recommendations  Rolling walker with 5" wheels    Recommendations for Other Services OT consult   Frequency 7X/week    Precautions / Restrictions Precautions Precautions: Knee Required Braces or Orthoses: Knee Immobilizer - Right Knee Immobilizer - Right: Discontinue once straight leg raise with < 10 degree lag Restrictions Weight Bearing Restrictions: No RLE Weight Bearing: Weight bearing as tolerated   Pertinent Vitals/Pain 5/10 R knee      Mobility  Bed Mobility Bed Mobility: Supine to Sit;Sit to Supine Supine to Sit: 4: Min assist Sit to Supine: 4: Min assist Details for Bed Mobility Assistance: Assist for R LE Transfers Transfers: Sit to Stand;Stand to Sit Sit to Stand: 4: Min assist;From bed Stand to Sit: 4: Min guard;To bed Details for Transfer Assistance: vcs safety, technique, hand placement. assist to rise, stabilize Ambulation/Gait Ambulation/Gait Assistance: 4: Min assist Ambulation Distance (Feet): 50 Feet Assistive device: Rolling walker Ambulation/Gait Assistance Details: vcs safety, sequence. slow gait speed.  Gait Pattern: Step-to pattern;Decreased stride length    Exercises     PT Diagnosis: Difficulty walking;Abnormality of gait;Acute pain  PT Problem List: Decreased strength;Decreased range of motion;Decreased activity  tolerance;Decreased mobility;Pain;Decreased knowledge of use of DME PT Treatment Interventions: DME instruction;Gait training;Functional mobility training;Therapeutic activities;Therapeutic exercise;Patient/family education   PT Goals Acute Rehab PT Goals PT Goal Formulation: With patient Time For Goal Achievement: 03/09/13 Potential to Achieve Goals: Good Pt will go Supine/Side to Sit: with supervision PT Goal: Supine/Side to Sit - Progress: Goal set today Pt will go Sit to Supine/Side: with supervision PT Goal: Sit to Supine/Side - Progress: Goal set today Pt will go Sit to Stand: with supervision PT Goal: Sit to Stand - Progress: Goal set today Pt will Ambulate: 51 - 150 feet;with supervision;with least restrictive assistive device PT Goal: Ambulate - Progress: Goal set today Pt will Perform Home Exercise Program: with supervision, verbal cues required/provided PT Goal: Perform Home Exercise Program - Progress: Goal set today  Visit Information  Last PT Received On: 03/02/13 Assistance Needed: +1    Subjective Data  Subjective: I have to get moving Patient Stated Goal: regain independence   Prior Functioning  Home Living Lives With: Spouse Available Help at Discharge: Family Type of Home: House Home Access: Level entry Home Layout: One level;Laundry or work area in basement [employer] Shower/Tub: Teacher, adult education: Paediatric nurse with back;Straight cane Prior Function Level of Independence: Independent Able to Take Stairs?: Yes Driving: Yes Communication Communication: No difficulties    Cognition  Cognition Arousal/Alertness: Awake/alert Behavior During Therapy: WFL for tasks assessed/performed Overall Cognitive Status: Within Functional Limits for tasks assessed    Extremity/Trunk Assessment Right Lower Extremity Assessment RLE ROM/Strength/Tone: Deficits RLE ROM/Strength/Tone Deficits: hip flex 3-/5, hip abd/add 2/5, moves ankle well Left  Lower Extremity Assessment LLE ROM/Strength/Tone: [facility] for tasks assessed Trunk  Assessment Trunk Assessment: Normal   Balance    End of Session PT - End of Session Equipment Utilized During Treatment: Right knee immobilizer Activity Tolerance: Patient tolerated treatment well Patient left: in bed;with call bell/phone within reach CPM Right Knee CPM Right Knee: Off  GP     Rebeca Alert, MPT Pager: [phone]

## 2013-03-02 NOTE — H&P (View-Only) (Signed)
Paula Murphy  DOB: 02/21/1946 Married / Language: English / Race: White Female  Date of Admission:  03/02/2013  Chief Complaint:  Right Knee Pain  History of Present Illness The patient is a 67 year old female who comes in for a preoperative History and Physical. The patient is scheduled for a right total knee arthroplasty to be performed by Dr. Frank V. Aluisio, MD at [facility] on 03/02/2013. The patient is a 67 year old female who presents with knee complaints. The patient is seen in referral from her Rheumatologist. The patient reports right knee symptoms including: pain which began year(s) ago without any known injury. Prior to being seen today the patient was previously evaluated by a collegue. Past treatment for this problem has included intra-articular injection of corticosteroids (as well as viscosupplementation). Note for "Knee pain": She had a knee scope done by Dr. Collins in 2000. She said that did help for a short time. She has had cortisone injections over the past 14 years, as well as visco this past January. Her Rheumatologist did the visco series for her. Paula Murphy comes in for evaluation of her right knee. She has had problems with her right knee for many years. She had a previous arthroscopy and debridement in the past by Dr. Gioffre. She states over the years she has multiple cortisone shots and a round of visco supplementation by her rheumatologist, Dr. Deveshwar, who sees her for her fibromyalgia. At this point in time, she has now failed cortisone injections and visco supplementation. She has pain with activity. She had pain with getting up and down out of a chair and getting up and down steps. She also has pain that awakens her at night when she moves in certain ways. Due to her continued symptoms, she comes in and is ready to proceed with total knee arthroplasty. They have been treated conservatively in the past for the above stated problem and despite  conservative measures, they continue to have progressive pain and severe functional limitations and dysfunction. They have failed non-operative management including home exercise, medications, and injections. It is felt that they would benefit from undergoing total joint replacement. Risks and benefits of the procedure have been discussed with the patient and they elect to proceed with surgery. There are no active contraindications to surgery such as ongoing infection or rapidly progressive neurological disease.   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 Sick Hydrocodone w/APAP *ANALGESICS - OPIOID*. Sickness   Family History Osteoarthritis. mother Rheumatoid Arthritis. father Heart Disease. father Cancer. mother and father Chronic Obstructive Lung Disease. mother and father Diabetes Mellitus. father Father. Deceased. age 86 Mother. Deceased. age 81   Social History Drug/Alcohol Rehab (Currently). no Drug/Alcohol Rehab (Previously). no Exercise. Exercises weekly; does gym / weights Current work status. retired Alcohol use. current drinker; drinks beer and wine; only occasionally per week Children. 1 Illicit drug use. no Tobacco use. never smoker Tobacco / smoke exposure. no Marital status. married Number of flights of stairs before winded. 2-3 Pain Contract. no Advance Directives. Living Will Post-Surgical Plans. Home   Medication History Venlafaxine HCl ER (37.5MG Capsule ER 24HR, Oral daily) Active. Topiramate (50MG Tablet, Oral three times daily) Active. Lyrica (75MG Capsule, Oral daily) Active. Vitamin D (2000UNIT Tablet, Oral daily) Active. Aspirin (81MG Tablet, Oral daily) Active. ALPRAZolam (0.5MG Tablet, Oral daily as needed) Active. (prn) Levothyroxine Sodium (137MCG Tablet, Oral daily) Active. ICaps (2 Oral daily) Active. TraMADol HCl (50MG Tablet, 2   Oral daily) Active. Ketamine  10%/Ketoprofen 10%/Gabapentin 4%/Cyclobenzaprine 2%/Lidocaine 5% ( External four times daily, as needed) Active. (prn; gabapentin/lidocaine topical 2%)   Past Surgical History Gallbladder Surgery. Date: 1994. laporoscopic Hysterectomy. Date: 1980. complete (non-cancerous) Neck Disc Surgery. Date: 2004. Foot Surgery. Date: 2012. left; Bunion Arthroscopy of Knee. Date: 1998. right Cataract Surgery. bilateral Tonsillectomy. 1950's   Medical History Hypothyroidism Hypercholesterolemia Fibromyalgia Macular Degeneration Cataract. Bilateral Cataract Surgery Hemorrhoids   Review of Systems General:Not Present- Chills, Fever, Night Sweats, Fatigue, Weight Gain, Weight Loss and Memory Loss. Skin:Not Present- Hives, Itching, Rash, Eczema and Lesions. HEENT:Not Present- Tinnitus, Headache, Double Vision, Visual Loss, Hearing Loss and Dentures. Respiratory:Not Present- Shortness of breath with exertion, Shortness of breath at rest, Allergies, Coughing up blood and Chronic Cough. Cardiovascular:Not Present- Chest Pain, Racing/skipping heartbeats, Difficulty Breathing Lying Down, Murmur, Swelling and Palpitations. Gastrointestinal:Not Present- Bloody Stool, Heartburn, Abdominal Pain, Vomiting, Nausea, Constipation, Diarrhea, Difficulty Swallowing, Jaundice and Loss of appetitie. Female Genitourinary:Not Present- Blood in Urine, Urinary frequency, Weak urinary stream, Discharge, Flank Pain, Incontinence, Painful Urination, Urgency, Urinary Retention and Urinating at Night. Musculoskeletal:Present- Muscle Pain and Joint Pain. Not Present- Muscle Weakness, Joint Swelling, Back Pain, Morning Stiffness and Spasms. Neurological:Not Present- Tremor, Dizziness, Blackout spells, Paralysis, Difficulty with balance and Weakness. Psychiatric:Not Present- Insomnia.   Vitals 02/19/2013 3:04 PM Pulse: 84 (Regular) Resp.: 12 (Unlabored) BP: 132/82 (Sitting, Right Arm,  Standard)    Physical Exam The physical exam findings are as follows:  Note: Patient is a 67 year old female with continued right knee pain. Patient is accompanied today by her daughter Katrina.   General Mental Status - Alert, cooperative and good historian. General Appearance- pleasant. Not in acute distress. Orientation- Oriented X3. Build & Nutrition- Well nourished and Well developed.   Head and Neck Head- normocephalic, atraumatic . Neck Global Assessment- supple. no bruit auscultated on the right and no bruit auscultated on the left.   Eye Vision- Wears corrective lenses. Pupil- Bilateral- Regular and Round. Motion- Bilateral- EOMI.   Chest and Lung Exam Auscultation: Breath sounds:- clear at anterior chest wall and - clear at posterior chest wall. Adventitious sounds:- No Adventitious sounds.   Cardiovascular Auscultation:Rhythm- Regular rate and rhythm. Heart Sounds- S1 WNL and S2 WNL. Murmurs & Other Heart Sounds:Auscultation of the heart reveals - No Murmurs.   Abdomen Palpation/Percussion:Tenderness- Abdomen is non-tender to palpation. Rigidity (guarding)- Abdomen is soft. Auscultation:Auscultation of the abdomen reveals - Bowel sounds normal.   Female Genitourinary  Not done, not pertinent to present illness  Musculoskeletal Her knee shows no effusion. She has a varus deformity. She has crepitation throughout the range of motion. There is a positive patellar inhibition test. There is pain on McMurray's. No clunk and no ligamentous laxity.  RADIOGRAPHS: X-rays reveal bone on bone medial compartment and patellofemoral changes as well as moderate lateral compartment degeneration.  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 Aluisio.  Plan is to go home follwoing surgery.  Signed electronically by DREW L Abbee Cremeens, PA-C  

## 2013-03-02 NOTE — Op Note (Signed)
Pre-operative diagnosis- Osteoarthritis  Right knee(s)  Post-operative diagnosis- Osteoarthritis Right knee(s)  Procedure-  Right  Total Knee Arthroplasty  Surgeon- Gus Rankin. Blu Lori, MD  Assistant- Avel Peace, PA-C   Anesthesia-  Spinal EBL-* No blood loss amount entered *  Drains Hemovac  Tourniquet time-  Total Tourniquet Time Documented: Thigh (Right) - 32 minutes Total: Thigh (Right) - 32 minutes    Complications- None  Condition-PACU - hemodynamically stable.   Brief Clinical Note  Paula Murphy is a 67 y.o. year old female with end stage OA of her right knee with progressively worsening pain and dysfunction. She has constant pain, with activity and at rest and significant functional deficits with difficulties even with ADLs. She has had extensive non-op management including analgesics, injections of cortisone and viscosupplements, and home exercise program, but remains in significant pain with significant dysfunction.Radiographs show bone on bone arthritis medial and patellofemoral. She presents now for right Total Knee Arthroplasty.    Procedure in detail---   The patient is brought into the operating room and positioned supine on the operating table. After successful administration of  Spinal,   a tourniquet is placed high on the  Right thigh(s) and the lower extremity is prepped and draped in the usual sterile fashion. Time out is performed by the operating team and then the  Right lower extremity is wrapped in Esmarch, knee flexed and the tourniquet inflated to 300 mmHg.       A midline incision is made with a ten blade through the subcutaneous tissue to the level of the extensor mechanism. A fresh blade is used to make a medial parapatellar arthrotomy. Soft tissue over the proximal medial tibia is subperiosteally elevated to the joint line with a knife and into the semimembranosus bursa with a Cobb elevator. Soft tissue over the proximal lateral tibia is elevated with  attention being paid to avoiding the patellar tendon on the tibial tubercle. The patella is everted, knee flexed 90 degrees and the ACL and PCL are removed. Findings are bone on bone medial and patellofemoral with large global osteophytes.        The drill is used to create a starting hole in the distal femur and the canal is thoroughly irrigated with sterile saline to remove the fatty contents. The 5 degree Right  valgus alignment guide is placed into the femoral canal and the distal femoral cutting block is pinned to remove 10 mm off the distal femur. Resection is made with an oscillating saw.      The tibia is subluxed forward and the menisci are removed. The extramedullary alignment guide is placed referencing proximally at the medial aspect of the tibial tubercle and distally along the second metatarsal axis and tibial crest. The block is pinned to remove 2mm off the more deficient medial  side. Resection is made with an oscillating saw. Size 2.5is the most appropriate size for the tibia and the proximal tibia is prepared with the modular drill and keel punch for that size.      The femoral sizing guide is placed and size 3 is most appropriate. Rotation is marked off the epicondylar axis and confirmed by creating a rectangular flexion gap at 90 degrees. The size 3 cutting block is pinned in this rotation and the anterior, posterior and chamfer cuts are made with the oscillating saw. The intercondylar block is then placed and that cut is made.      Trial size 2.5 tibial component, trial size 3 posterior  stabilized femur and a 12.5  mm posterior stabilized rotating platform insert trial is placed. Full extension is achieved with excellent varus/valgus and anterior/posterior balance throughout full range of motion. The patella is everted and thickness measured to be 22  mm. Free hand resection is taken to 12 mm, a 35 template is placed, lug holes are drilled, trial patella is placed, and it tracks normally.  Osteophytes are removed off the posterior femur with the trial in place. All trials are removed and the cut bone surfaces prepared with pulsatile lavage. Cement is mixed and once ready for implantation, the size 2.5 tibial implant, size  3 posterior stabilized femoral component, and the size 35 patella are cemented in place and the patella is held with the clamp. The trial insert is placed and the knee held in full extension. The Exparel (20 ml mixed with 50 ml saline) is injected into the extensor mechanism, posterior capsule, medial and lateral gutters and subcutaneous tissues.  All extruded cement is removed and once the cement is hard the permanent 12.5 mm posterior stabilized rotating platform insert is placed into the tibial tray.      The wound is copiously irrigated with saline solution and the extensor mechanism closed over a hemovac drain with #1 PDS suture. The tourniquet is released for a total tourniquet time of 32  minutes. Flexion against gravity is 140 degrees and the patella tracks normally. Subcutaneous tissue is closed with 2.0 vicryl and subcuticular with running 4.0 Monocryl. The incision is cleaned and dried and steri-strips and a bulky sterile dressing are applied. The limb is placed into a knee immobilizer and the patient is awakened and transported to recovery in stable condition.      Please note that a surgical assistant was a medical necessity for this procedure in order to perform it in a safe and expeditious manner. Surgical assistant was necessary to retract the ligaments and vital neurovascular structures to prevent injury to them and also necessary for proper positioning of the limb to allow for anatomic placement of the prosthesis.   Gus Rankin Amrit Cress, MD    03/02/2013, 9:16 AM

## 2013-03-02 NOTE — Anesthesia Preprocedure Evaluation (Addendum)
Anesthesia Evaluation  Patient identified by MRN, date of birth, ID band Patient awake    Reviewed: Allergy & Precautions, H&P , NPO status , Patient's Chart, lab work & pertinent test results  History of Anesthesia Complications (+) PONV  Airway Mallampati: II TM Distance: >3 FB Neck ROM: Full    Dental  (+) Dental Advisory Given, Edentulous Upper and Partial Lower   Pulmonary neg pulmonary ROS,  breath sounds clear to auscultation        Cardiovascular hypertension, Rhythm:Regular Rate:Normal + Systolic murmurs    Neuro/Psych  Headaches, PSYCHIATRIC DISORDERS Depression    GI/Hepatic negative GI ROS, Neg liver ROS,   Endo/Other  Hypothyroidism   Renal/GU negative Renal ROS     Musculoskeletal  (+) Fibromyalgia -  Abdominal   Peds  Hematology negative hematology ROS (+)   Anesthesia Other Findings   Reproductive/Obstetrics negative OB ROS                          Anesthesia Physical Anesthesia Plan  ASA: II  Anesthesia Plan: Spinal   Post-op Pain Management:    Induction: Intravenous  Airway Management Planned:   Additional Equipment:   Intra-op Plan:   Post-operative Plan:   Informed Consent: I have reviewed the patients History and Physical, chart, labs and discussed the procedure including the risks, benefits and alternatives for the proposed anesthesia with the patient or authorized representative who has indicated his/her understanding and acceptance.   Dental advisory given  Plan Discussed with: CRNA  Anesthesia Plan Comments:        Anesthesia Quick Evaluation

## 2013-03-03 ENCOUNTER — Encounter (HOSPITAL_COMMUNITY): Payer: Self-pay | Admitting: Orthopedic Surgery

## 2013-03-03 LAB — CBC
MCHC: 32.6 g/dL (ref 30.0–36.0)
MCV: 93.3 fL (ref 78.0–100.0)
Platelets: 182 10*3/uL (ref 150–400)
RDW: 12.8 % (ref 11.5–15.5)
WBC: 8.5 10*3/uL (ref 4.0–10.5)

## 2013-03-03 LAB — BASIC METABOLIC PANEL
BUN: 9 mg/dL (ref 6–23)
Calcium: 8.4 mg/dL (ref 8.4–10.5)
Chloride: 105 mEq/L (ref 96–112)
Creatinine, Ser: 0.62 mg/dL (ref 0.50–1.10)
GFR calc Af Amer: >90 mL/min (ref >90)
GFR calc non Af Amer: >90 mL/min (ref >90)

## 2013-03-03 MED ORDER — ACETAMINOPHEN 10 MG/ML IV SOLN
1000.0000 mg | Freq: Four times a day (QID) | INTRAVENOUS | Status: DC
  Filled 2013-03-03: qty 100

## 2013-03-03 MED ORDER — ACETAMINOPHEN 10 MG/ML IV SOLN: 1000.0000 mg | Freq: Four times a day (QID) | INTRAVENOUS | Status: DC

## 2013-03-03 MED ORDER — PREGABALIN 50 MG PO CAPS
50.0000 mg | ORAL_CAPSULE | Freq: Every morning | ORAL | Status: DC
  Administered 2013-03-03: 50 mg via ORAL
  Filled 2013-03-03: qty 1

## 2013-03-03 MED ORDER — TRAMADOL HCL 50 MG PO TABS: 50.0000 mg | ORAL_TABLET | Freq: Four times a day (QID) | ORAL | Status: DC | PRN

## 2013-03-03 MED ORDER — METHOCARBAMOL 500 MG PO TABS: 500.0000 mg | ORAL_TABLET | Freq: Four times a day (QID) | ORAL | Status: DC | PRN

## 2013-03-03 MED ORDER — HYDROMORPHONE HCL 2 MG PO TABS: 2.0000 mg | ORAL_TABLET | ORAL | Status: DC | PRN

## 2013-03-03 MED ORDER — RIVAROXABAN 10 MG PO TABS: 10.0000 mg | ORAL_TABLET | Freq: Every day | ORAL | Status: DC

## 2013-03-03 NOTE — Progress Notes (Addendum)
Physical Therapy Treatment Patient Details Name: Paula Murphy MRN: [id number] DOB: 11-03-1946 Today's Date: 03/03/2013 Time: 4098-1191 PT Time Calculation (min): 25 min  PT Assessment / Plan / Recommendation Comments on Treatment Session  Progressing well. Recommend HHPT.     Follow Up Recommendations  Home health PT     Does the patient have the potential to tolerate intense rehabilitation     Barriers to Discharge        Equipment Recommendations  Rolling walker with 5" wheels    Recommendations for Other Services OT consult  Frequency 7X/week   Plan Discharge plan remains appropriate    Precautions / Restrictions Precautions Precautions: Knee Required Braces or Orthoses: Knee Immobilizer - Right (Ki dc'd 4/29-able to SLR) Knee Immobilizer - Right: Discontinue once straight leg raise with < 10 degree lag Restrictions Weight Bearing Restrictions: No RLE Weight Bearing: Weight bearing as tolerated   Pertinent Vitals/Pain 7/10 R knee with activity    Mobility  Bed Mobility Bed Mobility: Supine to Sit Supine to Sit: 4: Min guard Transfers Transfers: Sit to Stand;Stand to Sit Sit to Stand: 4: Min guard;From bed;From toilet Stand to Sit: 4: Min guard;To chair/3-in-1;To toilet Details for Transfer Assistance: vcs safety, technique, hand placement.  Ambulation/Gait Ambulation/Gait Assistance: 4: Min guard Ambulation Distance (Feet): 135 Feet Assistive device: Rolling walker Ambulation/Gait Assistance Details: VCs safety. Pt beginning to use reciprocal gait pattern. Slow gait speed.  Gait Pattern: Step-through pattern;Step-to pattern;Decreased stride length;Trunk flexed;Decreased stance time - right    Exercises Total Joint Exercises Ankle Circles/Pumps: AROM;Both;20 reps;Supine Quad Sets: AROM;Both;10 reps;Supine Short Arc Quad: AAROM;Right;10 reps;Supine Heel Slides: AAROM;Right;10 reps;Supine Hip ABduction/ADduction: AAROM;Right;10 reps;Supine Straight Leg  Raises: AAROM;Right;10 reps;Supine Knee ROM: 10-70 degrees;supine   PT Diagnosis:    PT Problem List:   PT Treatment Interventions:     PT Goals Acute Rehab PT Goals Pt will go Supine/Side to Sit: with supervision PT Goal: Supine/Side to Sit - Progress: Progressing toward goal Pt will go Sit to Stand: with supervision PT Goal: Sit to Stand - Progress: Progressing toward goal Pt will Ambulate: 51 - 150 feet;with supervision;with least restrictive assistive device PT Goal: Ambulate - Progress: Progressing toward goal  Visit Information  Last PT Received On: 03/03/13 Assistance Needed: +1    Subjective Data  Subjective: Am I doing as well as other people? Patient Stated Goal: regain independence   Cognition  Cognition Arousal/Alertness: Awake/alert Behavior During Therapy: WFL for tasks assessed/performed Overall Cognitive Status: Within Functional Limits for tasks assessed    Balance     End of Session PT - End of Session Equipment Utilized During Treatment: Gait belt Activity Tolerance: Patient tolerated treatment well Patient left: in chair;with call bell/phone within reach   GP     Rebeca Alert, MPT Pager: [phone]

## 2013-03-03 NOTE — Progress Notes (Signed)
   Subjective: 1 Day Post-Op Procedure(s) (LRB): RIGHT TOTAL KNEE ARTHROPLASTY (Right) Patient reports pain as mild.   Patient seen in rounds with Dr. Lequita Halt. Patient is well, and has had no acute complaints or problems We will start therapy today.  Plan is to go Home after hospital stay.  Objective: Vital signs in last 24 hours: Temp:  [97.3 F (36.3 C)-98.5 F (36.9 C)] 98.5 F (36.9 C) (04/29 0521) Pulse Rate:  [65-97] 78 (04/29 0521) Resp:  [10-18] 16 (04/29 0521) BP: (99-136)/(58-77) 120/73 mmHg (04/29 0521) SpO2:  [94 %-100 %] 96 % (04/29 0521) Weight:  [71.668 kg (158 lb)] 71.668 kg (158 lb) (04/28 1305)  Intake/Output from previous day:  Intake/Output Summary (Last 24 hours) at 03/03/13 0755 Last data filed at 03/03/13 0522  Gross per 24 hour  Intake 3183.75 ml  Output   3040 ml  Net 143.75 ml    Intake/Output this shift:    Labs:  Recent Labs  03/03/13 0430  HGB 10.4*    Recent Labs  03/03/13 0430  WBC 8.5  RBC 3.42*  HCT 31.9*  PLT 182    Recent Labs  03/03/13 0430  NA 138  K 3.9  CL 105  CO2 27  BUN 9  CREATININE 0.62  GLUCOSE 158*  CALCIUM 8.4   No results found for this basename: LABPT, INR,  in the last 72 hours  EXAM General - Patient is Alert, Appropriate and Oriented Extremity - Neurovascular intact Sensation intact distally Dorsiflexion/Plantar flexion intact Dressing - dressing C/D/I Motor Function - intact, moving foot and toes well on exam.  Hemovac pulled without difficulty.  Past Medical History  Diagnosis Date  . Fibromyalgia     followed by Dr. Corliss Skains  . Autoimmune disease     + ANA, and DS DNA--PT STATES SHE WAS TOLD SHE DOES NOT HAVE LUPUS AS PREVIOUSLY THOUGHT  . Hyperlipidemia   . Thyroid disease     Hypothyroidism  . Hypothyroidism   . Hypertension     PAST HX OF HYPERTENSION - TOOK MEDICATION--BUT OFF MEDICATION FOR YEARS-NO LONGER A PROBLEM  . Arthritis     OA AND PAIN RT KNEE  . Macular  degeneration     BOTH EYES  . Hemorrhoids   . PONV (postoperative nausea and vomiting)     Assessment/Plan: 1 Day Post-Op Procedure(s) (LRB): RIGHT TOTAL KNEE ARTHROPLASTY (Right) Principal Problem:   OA (osteoarthritis) of knee  Estimated body mass index is 28.89 kg/(m^2) as calculated from the following:   Height as of this encounter: 5\' 2"  (1.575 m).   Weight as of this encounter: 71.668 kg (158 lb). Advance diet Up with therapy Plan for discharge tomorrow Discharge home with home health  DVT Prophylaxis - Xarelto Weight-Bearing as tolerated to right leg No vaccines. D/C O2 and Pulse OX and try on Room [address]  Patrica Duel 03/03/2013, 7:55 AM

## 2013-03-03 NOTE — Progress Notes (Signed)
Physical Therapy Treatment Patient Details Name: Paula Murphy MRN: [id number] DOB: 02/28/1946 Today's Date: 03/03/2013 Time: 4098-1191 PT Time Calculation (min): 10 min  PT Assessment / Plan / Recommendation Comments on Treatment Session  Progressing well. Recommend HHPT. Plan is for d/c home tomorrow.    Follow Up Recommendations  Home health PT     Does the patient have the potential to tolerate intense rehabilitation     Barriers to Discharge        Equipment Recommendations  Rolling walker with 5" wheels    Recommendations for Other Services OT consult  Frequency 7X/week   Plan Discharge plan remains appropriate    Precautions / Restrictions Precautions Precautions: Knee Required Braces or Orthoses:  (KI dc/d 4/29-able to SLR) Knee Immobilizer - Right: Discontinue once straight leg raise with < 10 degree lag Restrictions Weight Bearing Restrictions: No RLE Weight Bearing: Weight bearing as tolerated   Pertinent Vitals/Pain 5/10 R knee    Mobility  Bed Mobility Bed Mobility: Sit to Supine Supine to Sit: 4: Min guard Sit to Supine: 4: Min assist Details for Bed Mobility Assistance: Assist for R LE Transfers Transfers: Sit to Stand;Stand to Sit Sit to Stand: 4: Min guard;From chair/3-in-1 Stand to Sit: 4: Min guard;To bed Details for Transfer Assistance: vcs safety, technique, hand placement.  Ambulation/Gait Ambulation/Gait Assistance: 4: Min guard Ambulation Distance (Feet): 150 Feet Assistive device: Rolling walker Ambulation/Gait Assistance Details: VCs safety. Pt beginning to use reciprocal gait pattern. Slow gait speed.  Gait Pattern: Step-through pattern;Trunk flexed;Right flexed knee in stance    Exercises Total Joint Exercises Ankle Circles/Pumps: AROM;Both;20 reps;Supine Quad Sets: AROM;Both;10 reps;Supine Short Arc Quad: AAROM;Right;10 reps;Supine Heel Slides: AAROM;Right;10 reps;Supine Hip ABduction/ADduction: AAROM;Right;10  reps;Supine Straight Leg Raises: AAROM;Right;10 reps;Supine   PT Diagnosis:    PT Problem List:   PT Treatment Interventions:     PT Goals Acute Rehab PT Goals Pt will go Supine/Side to Sit: with supervision PT Goal: Supine/Side to Sit - Progress: Progressing toward goal Pt will go Sit to Stand: with supervision PT Goal: Sit to Stand - Progress: Progressing toward goal Pt will Ambulate: 51 - 150 feet;with supervision;with least restrictive assistive device PT Goal: Ambulate - Progress: Progressing toward goal  Visit Information  Last PT Received On: 03/03/13 Assistance Needed: +1    Subjective Data  Subjective: Be honest with me....am I doing well Patient Stated Goal: regain independence   Cognition  Cognition Arousal/Alertness: Awake/alert Behavior During Therapy: WFL for tasks assessed/performed Overall Cognitive Status: Within Functional Limits for tasks assessed    Balance     End of Session PT - End of Session Equipment Utilized During Treatment: Gait belt Activity Tolerance: Patient tolerated treatment well Patient left: in bed;with call bell/phone within reach   GP     Rebeca Alert, MPT Pager: [phone]

## 2013-03-04 LAB — BASIC METABOLIC PANEL
BUN: 9 mg/dL (ref 6–23)
Chloride: 108 mEq/L (ref 96–112)
GFR calc Af Amer: >90 mL/min (ref >90)
GFR calc non Af Amer: >90 mL/min (ref >90)
Potassium: 3.9 mEq/L (ref 3.5–5.1)
Sodium: 138 mEq/L (ref 135–145)

## 2013-03-04 LAB — CBC
HCT: 31.4 % — ABNORMAL LOW (ref 36.0–46.0)
MCHC: 31.5 g/dL (ref 30.0–36.0)
RDW: 13.1 % (ref 11.5–15.5)
WBC: 10.1 10*3/uL (ref 4.0–10.5)

## 2013-03-04 MED ORDER — METHOCARBAMOL 500 MG PO TABS: 500.0000 mg | ORAL_TABLET | Freq: Four times a day (QID) | ORAL | Status: DC | PRN

## 2013-03-04 MED ORDER — TRAMADOL HCL 50 MG PO TABS: 50.0000 mg | ORAL_TABLET | Freq: Four times a day (QID) | ORAL | Status: DC | PRN

## 2013-03-04 MED ORDER — PREGABALIN 75 MG PO CAPS
75.0000 mg | ORAL_CAPSULE | Freq: Every day | ORAL | Status: DC
  Administered 2013-03-04: 75 mg via ORAL

## 2013-03-04 MED ORDER — RIVAROXABAN 10 MG PO TABS: 10.0000 mg | ORAL_TABLET | Freq: Every day | ORAL | Status: DC

## 2013-03-04 NOTE — Evaluation (Signed)
Occupational Therapy Evaluation Patient Details Name: Paula Murphy MRN: [id number] DOB: 1946-01-31 Today's Date: 03/04/2013 Time: 4098- 915    OT Assessment / Plan / Recommendation Clinical Impression  Pt presents to OT s.p TKR. All education completed regarding ADL activity s/p TKR    OT Assessment  Patient does not need any further OT services    Follow Up Recommendations  No OT follow up                Precautions / Restrictions Precautions Precautions: Knee Restrictions Weight Bearing Restrictions: No RLE Weight Bearing: Weight bearing as tolerated       ADL  Grooming: Performed;Wash/dry face;Supervision/safety Where Assessed - Grooming: Unsupported standing Upper Body Bathing: Performed;Supervision/safety Where Assessed - Upper Body Bathing: Unsupported sitting Lower Body Bathing: Performed;Set up Where Assessed - Lower Body Bathing: Unsupported sit to stand Upper Body Dressing: Performed;Set up Where Assessed - Upper Body Dressing: Unsupported sitting Lower Body Dressing: Performed;Set up Where Assessed - Lower Body Dressing: Unsupported sit to stand Toilet Transfer: Performed;Supervision/safety Toilet Transfer Method: Sit to Barista: Regular height toilet Toileting - Clothing Manipulation and Hygiene: Supervision/safety Where Assessed - Toileting Clothing Manipulation and Hygiene: Standing      Visit Information  Last OT Received On: 03/04/13    Subjective Data  Subjective: I do feel like i am ready to go home   Prior Functioning     Home Living Lives With: Spouse Available Help at Discharge: Family Type of Home: House Home Access: Level entry Home Layout: One level;Laundry or work area in basement [employer] Shower/Tub: Teacher, adult education: Paediatric nurse with back;Straight cane Prior Function Level of Independence: Independent Able to Take Stairs?: Yes Driving: Yes Communication Communication: No  difficulties         Vision/Perception Vision - History Patient Visual Report: No change from baseline   Cognition  Cognition Arousal/Alertness: Awake/alert Behavior During Therapy: WFL for tasks assessed/performed Overall Cognitive Status: Within Functional Limits for tasks assessed    Extremity/Trunk Assessment Right Upper Extremity Assessment RUE ROM/Strength/Tone: [facility] for tasks assessed Left Upper Extremity Assessment LUE ROM/Strength/Tone: WFL for tasks assessed     Mobility Transfers Transfers: Sit to Stand;Stand to Sit Sit to Stand: 5: Supervision;From bed;From chair/3-in-1;From toilet;With upper extremity assist Stand to Sit: 5: Supervision;To toilet;With upper extremity assist           End of Session OT - End of Session Activity Tolerance: Patient tolerated treatment well Patient left: in chair;with call bell/phone within reach CPM Right Knee CPM Right Knee: Off  GO     Paula Murphy 03/04/2013, 9:15 AM

## 2013-03-04 NOTE — Progress Notes (Signed)
   Subjective: 2 Days Post-Op Procedure(s) (LRB): RIGHT TOTAL KNEE ARTHROPLASTY (Right) Patient reports pain as mild.   Patient seen in rounds for Dr. Lequita Halt. Patient is well, and has had no acute complaints or problems Patient is ready to go home  Objective: Vital signs in last 24 hours: Temp:  [98.6 F (37 C)-98.8 F (37.1 C)] 98.8 F (37.1 C) (04/30 0501) Pulse Rate:  [80-94] 80 (04/30 0501) Resp:  [16-18] 18 (04/30 0501) BP: (114-152)/(68-81) 147/68 mmHg (04/30 0501) SpO2:  [94 %-99 %] 97 % (04/30 0501)  Intake/Output from previous day:  Intake/Output Summary (Last 24 hours) at 03/04/13 0731 Last data filed at 03/04/13 0502  Gross per 24 hour  Intake    840 ml  Output   1200 ml  Net   -360 ml    Intake/Output this shift:    Labs:  Recent Labs  03/03/13 0430 03/04/13 0430  HGB 10.4* 9.9*    Recent Labs  03/03/13 0430 03/04/13 0430  WBC 8.5 10.1  RBC 3.42* 3.36*  HCT 31.9* 31.4*  PLT 182 179    Recent Labs  03/03/13 0430 03/04/13 0430  NA 138 138  K 3.9 3.9  CL 105 108  CO2 27 25  BUN 9 9  CREATININE 0.62 0.55  GLUCOSE 158* 166*  CALCIUM 8.4 8.4   No results found for this basename: LABPT, INR,  in the last 72 hours  EXAM: General - Patient is Alert, Appropriate and Oriented Extremity - Neurovascular intact Sensation intact distally Dorsiflexion/Plantar flexion intact No cellulitis present Incision - clean, dry, no drainage, healing Motor Function - intact, moving foot and toes well on exam.   Assessment/Plan: 2 Days Post-Op Procedure(s) (LRB): RIGHT TOTAL KNEE ARTHROPLASTY (Right) Procedure(s) (LRB): RIGHT TOTAL KNEE ARTHROPLASTY (Right) Past Medical History  Diagnosis Date  . Fibromyalgia     followed by Dr. Corliss Skains  . Autoimmune disease     + ANA, and DS DNA--PT STATES SHE WAS TOLD SHE DOES NOT HAVE LUPUS AS PREVIOUSLY THOUGHT  . Hyperlipidemia   . Thyroid disease     Hypothyroidism  . Hypothyroidism   . Hypertension       PAST HX OF HYPERTENSION - TOOK MEDICATION--BUT OFF MEDICATION FOR YEARS-NO LONGER A PROBLEM  . Arthritis     OA AND PAIN RT KNEE  . Macular degeneration     BOTH EYES  . Hemorrhoids   . PONV (postoperative nausea and vomiting)    Principal Problem:   OA (osteoarthritis) of knee  Estimated body mass index is 28.89 kg/(m^2) as calculated from the following:   Height as of this encounter: 5\' 2"  (1.575 m).   Weight as of this encounter: 71.668 kg (158 lb). Up with therapy Discharge home with home health Diet - Cardiac diet Follow up - in 2 weeks Activity - WBAT Disposition - Home Condition Upon Discharge - Good D/C Meds - See DC Summary DVT Prophylaxis - Xarelto  Paula Murphy 03/04/2013, 7:31 AM

## 2013-03-04 NOTE — Progress Notes (Signed)
Received orders for rw and commode.  Will be delivered to hospital room prior to d/c. °

## 2013-03-04 NOTE — Progress Notes (Signed)
Physical Therapy Treatment Patient Details Name: Paula Murphy MRN: [id number] DOB: 02/02/1946 Today's Date: 03/04/2013 Time: 4098-1191 PT Time Calculation (min): 24 min  PT Assessment / Plan / Recommendation Comments on Treatment Session  Plan is for home today. Completed all education. Recommend HHPT    Follow Up Recommendations  Home health PT     Does the patient have the potential to tolerate intense rehabilitation     Barriers to Discharge        Equipment Recommendations  Rolling walker with 5" wheels    Recommendations for Other Services OT consult  Frequency 7X/week   Plan Discharge plan remains appropriate    Precautions / Restrictions Precautions Precautions: Knee Required Braces or Orthoses:  (Ki dc'd 4/29-able to SLR) Restrictions Weight Bearing Restrictions: No RLE Weight Bearing: Weight bearing as tolerated   Pertinent Vitals/Pain 5-6/10 R knee    Mobility  Bed Mobility Bed Mobility: Supine to Sit;Sit to Supine Supine to Sit: 4: Min guard Sit to Supine: 4: Min guard Transfers Transfers: Sit to Stand;Stand to Sit Sit to Stand: 5: Supervision;From bed Stand to Sit: 5: Supervision;To bed Details for Transfer Assistance: vcs safety, technique, hand placement.  Ambulation/Gait Ambulation Distance (Feet): 150 Feet Assistive device: Rolling walker Gait Pattern: Step-through pattern;Right flexed knee in stance;Trunk flexed Stairs: No    Exercises Total Joint Exercises Ankle Circles/Pumps: AROM;Both;20 reps;Supine Quad Sets: AROM;Right;10 reps;Supine Short Arc Quad: AROM;Right;10 reps;Supine Heel Slides: AAROM;Right;10 reps;Supine Hip ABduction/ADduction: AROM;Right;10 reps;Supine Straight Leg Raises: AROM;Right;10 reps;Supine   PT Diagnosis:    PT Problem List:   PT Treatment Interventions:     PT Goals Acute Rehab PT Goals Pt will go Supine/Side to Sit: with supervision PT Goal: Supine/Side to Sit - Progress: Progressing toward goal Pt will  go Sit to Supine/Side: with supervision PT Goal: Sit to Supine/Side - Progress: Progressing toward goal Pt will go Sit to Stand: with supervision PT Goal: Sit to Stand - Progress: Met Pt will Ambulate: 51 - 150 feet;with supervision;with least restrictive assistive device PT Goal: Ambulate - Progress: Progressing toward goal Pt will Perform Home Exercise Program: with supervision, verbal cues required/provided PT Goal: Perform Home Exercise Program - Progress: Progressing toward goal  Visit Information  Last PT Received On: 03/04/13 Assistance Needed: +1    Subjective Data  Subjective: I hope I do everything right Patient Stated Goal: regain independence   Cognition  Cognition Arousal/Alertness: Awake/alert Behavior During Therapy: WFL for tasks assessed/performed Overall Cognitive Status: Within Functional Limits for tasks assessed    Balance     End of Session PT - End of Session Activity Tolerance: Patient tolerated treatment well Patient left: in bed;with call bell/phone within reach CPM Right Knee CPM Right Knee: Off   GP     Rebeca Alert, MPT Pager: [phone]

## 2013-03-12 NOTE — Discharge Summary (Signed)
Physician Discharge Summary   Patient ID: Paula Murphy MRN: [id number] DOB/AGE: [birth year] 66 y.o.  Admit date: 03/02/2013 Discharge date: 03/04/2013  Primary Diagnosis:  Osteoarthritis Right knee(  Admission Diagnoses:  Past Medical History  Diagnosis Date  . Fibromyalgia     followed by Dr. Corliss Skains  . Autoimmune disease     + ANA, and DS DNA--PT STATES SHE WAS TOLD SHE DOES NOT HAVE LUPUS AS PREVIOUSLY THOUGHT  . Hyperlipidemia   . Thyroid disease     Hypothyroidism  . Hypothyroidism   . Hypertension     PAST HX OF HYPERTENSION - TOOK MEDICATION--BUT OFF MEDICATION FOR YEARS-NO LONGER A PROBLEM  . Arthritis     OA AND PAIN RT KNEE  . Macular degeneration     BOTH EYES  . Hemorrhoids   . PONV (postoperative nausea and vomiting)    Discharge Diagnoses:   Principal Problem:   OA (osteoarthritis) of knee  Estimated body mass index is 28.89 kg/(m^2) as calculated from the following:   Height as of this encounter: 5\' 2"  (1.575 m).   Weight as of this encounter: 71.668 kg (158 lb).  Procedure:  Procedure(s) (LRB): RIGHT TOTAL KNEE ARTHROPLASTY (Right)   Consults: None  HPI: Paula Murphy is a 67 y.o. year old female with end stage OA of her right knee with progressively worsening pain and dysfunction. She has constant pain, with activity and at rest and significant functional deficits with difficulties even with ADLs. She has had extensive non-op management including analgesics, injections of cortisone and viscosupplements, and home exercise program, but remains in significant pain with significant dysfunction.Radiographs show bone on bone arthritis medial and patellofemoral. She presents now for right Total Knee Arthroplasty.   Laboratory Data: Admission on 03/02/2013, Discharged on 03/04/2013  Component Date Value Range Status  . WBC 03/03/2013 8.5  4.0 - 10.5 K/uL Final  . RBC 03/03/2013 3.42* 3.87 - 5.11 MIL/uL Final  . Hemoglobin 03/03/2013 10.4* 12.0 - 15.0  g/dL Final  . HCT 40/98/1191 31.9* 36.0 - 46.0 % Final  . MCV 03/03/2013 93.3  78.0 - 100.0 fL Final  . MCH 03/03/2013 30.4  26.0 - 34.0 pg Final  . MCHC 03/03/2013 32.6  30.0 - 36.0 g/dL Final  . RDW 47/82/9562 12.8  11.5 - 15.5 % Final  . Platelets 03/03/2013 182  150 - 400 K/uL Final  . Sodium 03/03/2013 138  135 - 145 mEq/L Final  . Potassium 03/03/2013 3.9  3.5 - 5.1 mEq/L Final  . Chloride 03/03/2013 105  96 - 112 mEq/L Final  . CO2 03/03/2013 27  19 - 32 mEq/L Final  . Glucose, Bld 03/03/2013 158* 70 - 99 mg/dL Final  . BUN 13/06/6577 9  6 - 23 mg/dL Final  . Creatinine, Ser 03/03/2013 0.62  0.50 - 1.10 mg/dL Final  . Calcium 46/96/2952 8.4  8.4 - 10.5 mg/dL Final  . GFR calc non Af Amer 03/03/2013 >90  >90 mL/min Final  . GFR calc Af Amer 03/03/2013 >90  >90 mL/min Final   Comment:                                 The eGFR has been calculated                          using the CKD EPI equation.  This calculation has not been                          validated in all clinical                          situations.                          eGFR's persistently                          <90 mL/min signify                          possible Chronic Kidney Disease.  . WBC 03/04/2013 10.1  4.0 - 10.5 K/uL Final  . RBC 03/04/2013 3.36* 3.87 - 5.11 MIL/uL Final  . Hemoglobin 03/04/2013 9.9* 12.0 - 15.0 g/dL Final  . HCT 16/08/9603 31.4* 36.0 - 46.0 % Final  . MCV 03/04/2013 93.5  78.0 - 100.0 fL Final  . MCH 03/04/2013 29.5  26.0 - 34.0 pg Final  . MCHC 03/04/2013 31.5  30.0 - 36.0 g/dL Final  . RDW 54/07/8118 13.1  11.5 - 15.5 % Final  . Platelets 03/04/2013 179  150 - 400 K/uL Final  . Sodium 03/04/2013 138  135 - 145 mEq/L Final  . Potassium 03/04/2013 3.9  3.5 - 5.1 mEq/L Final  . Chloride 03/04/2013 108  96 - 112 mEq/L Final  . CO2 03/04/2013 25  19 - 32 mEq/L Final  . Glucose, Bld 03/04/2013 166* 70 - 99 mg/dL Final  . BUN 14/78/2956 9  6 - 23 mg/dL  Final  . Creatinine, Ser 03/04/2013 0.55  0.50 - 1.10 mg/dL Final  . Calcium 21/30/8657 8.4  8.4 - 10.5 mg/dL Final  . GFR calc non Af Amer 03/04/2013 >90  >90 mL/min Final  . GFR calc Af Amer 03/04/2013 >90  >90 mL/min Final   Comment:                                 The eGFR has been calculated                          using the CKD EPI equation.                          This calculation has not been                          validated in all clinical                          situations.                          eGFR's persistently                          <90 mL/min signify                          possible Chronic Kidney Disease.  Hospital Outpatient Visit on 02/24/2013  Component Date Value Range Status  . MRSA, PCR 02/24/2013 NEGATIVE  NEGATIVE Final  . Staphylococcus aureus 02/24/2013 NEGATIVE  NEGATIVE Final   Comment:                                 The Xpert SA Assay (FDA                          approved for NASAL specimens                          in patients over 58 years of age),                          is one component of                          a comprehensive surveillance                          program.  Test performance has                          been validated by [employer] for patients greater                          than or equal to 22 year old.                          It is not intended                          to diagnose infection nor to                          guide or monitor treatment.  Marland Kitchen aPTT 02/24/2013 27  24 - 37 seconds Final  . WBC 02/24/2013 4.7  4.0 - 10.5 K/uL Final  . RBC 02/24/2013 4.30  3.87 - 5.11 MIL/uL Final  . Hemoglobin 02/24/2013 13.1  12.0 - 15.0 g/dL Final  . HCT 16/08/9603 40.0  36.0 - 46.0 % Final  . MCV 02/24/2013 93.0  78.0 - 100.0 fL Final  . MCH 02/24/2013 30.5  26.0 - 34.0 pg Final  . MCHC 02/24/2013 32.8  30.0 - 36.0 g/dL Final  . RDW 54/07/8118 12.8  11.5 - 15.5 % Final  . Platelets  02/24/2013 227  150 - 400 K/uL Final  . Sodium 02/24/2013 137  135 - 145 mEq/L Final  . Potassium 02/24/2013 4.0  3.5 - 5.1 mEq/L Final  . Chloride 02/24/2013 103  96 - 112 mEq/L Final  . CO2 02/24/2013 28  19 - 32 mEq/L Final  . Glucose, Bld 02/24/2013 94  70 - 99 mg/dL Final  . BUN 14/78/2956 11  6 - 23 mg/dL Final  . Creatinine, Ser 02/24/2013 0.68  0.50 - 1.10 mg/dL Final  . Calcium 21/30/8657 9.2  8.4 - 10.5 mg/dL Final  . Total Protein 02/24/2013 6.6  6.0 - 8.3  g/dL Final  . Albumin 16/08/9603 3.5  3.5 - 5.2 g/dL Final  . AST 54/07/8118 19  0 - 37 U/L Final  . ALT 02/24/2013 14  0 - 35 U/L Final  . Alkaline Phosphatase 02/24/2013 77  39 - 117 U/L Final  . Total Bilirubin 02/24/2013 0.3  0.3 - 1.2 mg/dL Final  . GFR calc non Af Amer 02/24/2013 89* >90 mL/min Final  . GFR calc Af Amer 02/24/2013 >90  >90 mL/min Final   Comment:                                 The eGFR has been calculated                          using the CKD EPI equation.                          This calculation has not been                          validated in all clinical                          situations.                          eGFR's persistently                          <90 mL/min signify                          possible Chronic Kidney Disease.  Marland Kitchen Prothrombin Time 02/24/2013 13.0  11.6 - 15.2 seconds Final  . INR 02/24/2013 0.99  0.00 - 1.49 Final  . ABO/RH(D) 02/24/2013 A POS   Final  . Antibody Screen 02/24/2013 NEG   Final  . Sample Expiration 02/24/2013 03/05/2013   Final  . Color, Urine 02/24/2013 AMBER* YELLOW Final   BIOCHEMICALS MAY BE AFFECTED BY COLOR  . APPearance 02/24/2013 CLOUDY* CLEAR Final  . Specific Gravity, Urine 02/24/2013 1.025  1.005 - 1.030 Final  . pH 02/24/2013 5.5  5.0 - 8.0 Final  . Glucose, UA 02/24/2013 NEGATIVE  NEGATIVE mg/dL Final  . Hgb urine dipstick 02/24/2013 NEGATIVE  NEGATIVE Final  . Bilirubin Urine 02/24/2013 NEGATIVE  NEGATIVE Final  . Ketones, ur  02/24/2013 NEGATIVE  NEGATIVE mg/dL Final  . Protein, ur 14/78/2956 NEGATIVE  NEGATIVE mg/dL Final  . Urobilinogen, UA 02/24/2013 0.2  0.0 - 1.0 mg/dL Final  . Nitrite 21/30/8657 NEGATIVE  NEGATIVE Final  . Leukocytes, UA 02/24/2013 TRACE* NEGATIVE Final  . Squamous Epithelial / LPF 02/24/2013 FEW* RARE Final  . WBC, UA 02/24/2013 0-2  <3 WBC/hpf Final  . Bacteria, UA 02/24/2013 FEW* RARE Final  . Crystals 02/24/2013 CA OXALATE CRYSTALS* NEGATIVE Final  . ABO/RH(D) 02/24/2013 A POS   Final     X-Rays:No results found.  EKG: Orders placed in visit on 07/24/10  . CONVERTED CEMR EKG     Hospital Course: Paula Murphy is a 67 y.o. who was admitted to [facility]. They were brought to the operating room on 03/02/2013 and underwent Procedure(s): RIGHT TOTAL KNEE ARTHROPLASTY.  Patient tolerated the  procedure well and was later transferred to the recovery room and then to the orthopaedic floor for postoperative care.  They were given PO and IV analgesics for pain control following their surgery.  They were given 24 hours of postoperative antibiotics of  Anti-infectives   Start     Dose/Rate Route Frequency Ordered Stop   03/02/13 1430  ceFAZolin (ANCEF) IVPB 1 g/50 mL premix     1 g 100 mL/hr over 30 Minutes Intravenous Every 6 hours 03/02/13 1131 03/02/13 2134   03/02/13 1131  valACYclovir (VALTREX) tablet 2,000 mg  Status:  Discontinued     2,000 mg Oral Every 4 hours PRN 03/02/13 1131 03/04/13 1442   03/02/13 0630  ceFAZolin (ANCEF) IVPB 2 g/50 mL premix     2 g 100 mL/hr over 30 Minutes Intravenous On call to O.R. 03/02/13 4098 03/02/13 0824     and started on DVT prophylaxis in the form of Xarelto.   PT and OT were ordered for total joint protocol.  Discharge planning consulted to help with postop disposition and equipment needs.  Patient had a decent night on the evening of surgery.  They started to get up OOB with therapy on day one. Hemovac drain was pulled without  difficulty.  Continued to work with therapy into day two.  Dressing was changed on day two and the incision was healing well.  Patient was seen in rounds and was ready to go home later that same day on day two.   Discharge Medications: Prior to Admission medications   Medication Sig Start Date End Date Taking? Authorizing Provider  ALPRAZolam Prudy Feeler) 0.5 MG tablet Take 0.5 mg by mouth at bedtime as needed for sleep.   Yes Historical Provider, MD  Cholecalciferol (VITAMIN D) 2000 UNITS tablet Take 2,000 Units by mouth daily.   Yes Historical Provider, MD  levothyroxine (SYNTHROID, LEVOTHROID) 137 MCG tablet Take 137 mcg by mouth every morning.    Yes Historical Provider, MD  pregabalin (LYRICA) 50 MG capsule Take 50 mg by mouth every morning.    Yes Historical Provider, MD  pregabalin (LYRICA) 75 MG capsule Take 75 mg by mouth daily. CAN'T TAKE LYRICA ON EMPTY STOMACH   Yes Historical Provider, MD  topiramate (TOPAMAX) 50 MG tablet Take by mouth at bedtime. TAKES 3 AT BEDTIME   Yes Historical Provider, MD  venlafaxine (EFFEXOR) 37.5 MG tablet Take 37.5 mg by mouth daily. TAKES AT NIGHT   Yes Historical Provider, MD  HYDROmorphone (DILAUDID) 2 MG tablet Take 1-2 tablets (2-4 mg total) by mouth every 4 (four) hours as needed. 03/03/13   Loanne Drilling, MD  methocarbamol (ROBAXIN) 500 MG tablet Take 1 tablet (500 mg total) by mouth every 6 (six) hours as needed. 03/04/13   Kimber Fritts Julien Girt, PA-C  PRESCRIPTION MEDICATION Apply 1 application topically daily. 30 gms dispensed; Vanicream 22gm; Ketamine 10%; Baclofen 2%; cyclobenzaprine 2%; diclofenac 3%; gabapentin 6%; lidocaine 2%; [facility] [place] [facility] APPLIED TOPICALLY TO SKIN AREAS THAT ARE PAINFUL DUE TO FIBROMYALGIA    Historical Provider, MD  rivaroxaban (XARELTO) 10 MG TABS tablet Take 1 tablet (10 mg total) by mouth daily with breakfast. Take Xarelto for two and a half more weeks, then discontinue Xarelto. 03/04/13   Que Meneely, PA-C    traMADol (ULTRAM) 50 MG tablet Take 1-2 tablets (50-100 mg total) by mouth every 6 (six) hours as needed for pain. 03/04/13   Bryella Diviney Julien Girt, PA-C  valACYclovir (VALTREX) 500 MG tablet Take 2,000 mg by mouth  every 4 (four) hours as needed (fever blisters  --NONE USED IN PAST 2 YRS).     Historical Provider, MD  Venlafaxine HCl 75 MG TB24 Take 37.5 tablets by mouth at bedtime.    Historical Provider, MD    Diet: Cardiac diet Activity:WBAT Follow-up:in 2 weeks Disposition - Home Discharged Condition: good      Medication List    STOP taking these medications       ICAPS Tabs     senna 8.6 MG tablet  Commonly known as:  SENOKOT      TAKE these medications       ALPRAZolam 0.5 MG tablet  Commonly known as:  XANAX  Take 0.5 mg by mouth at bedtime as needed for sleep.     HYDROmorphone 2 MG tablet  Commonly known as:  DILAUDID  Take 1-2 tablets (2-4 mg total) by mouth every 4 (four) hours as needed.     levothyroxine 137 MCG tablet  Commonly known as:  SYNTHROID, LEVOTHROID  Take 137 mcg by mouth every morning.     methocarbamol 500 MG tablet  Commonly known as:  ROBAXIN  Take 1 tablet (500 mg total) by mouth every 6 (six) hours as needed.     pregabalin 50 MG capsule  Commonly known as:  LYRICA  Take 50 mg by mouth every morning.     pregabalin 75 MG capsule  Commonly known as:  LYRICA  Take 75 mg by mouth daily. CAN'T TAKE LYRICA ON EMPTY STOMACH     PRESCRIPTION MEDICATION  Apply 1 application topically daily. 30 gms dispensed; Vanicream 22gm; Ketamine 10%; Baclofen 2%; cyclobenzaprine 2%; diclofenac 3%; gabapentin 6%; lidocaine 2%;  [employer] [place] drug  APPLIED TOPICALLY TO SKIN AREAS THAT ARE PAINFUL DUE TO FIBROMYALGIA     rivaroxaban 10 MG Tabs tablet  Commonly known as:  XARELTO  Take 1 tablet (10 mg total) by mouth daily with breakfast. Take Xarelto for two and a half more weeks, then discontinue Xarelto.     topiramate 50 MG tablet  Commonly known  as:  TOPAMAX  Take by mouth at bedtime. TAKES 3 AT BEDTIME     traMADol 50 MG tablet  Commonly known as:  ULTRAM  Take 1-2 tablets (50-100 mg total) by mouth every 6 (six) hours as needed for pain.     valACYclovir 500 MG tablet  Commonly known as:  VALTREX  Take 2,000 mg by mouth every 4 (four) hours as needed (fever blisters  --NONE USED IN PAST 2 YRS).     Venlafaxine HCl 75 MG Tb24  Take 37.5 tablets by mouth at bedtime.     venlafaxine 37.5 MG tablet  Commonly known as:  EFFEXOR  Take 37.5 mg by mouth daily. TAKES AT NIGHT     Vitamin D 2000 UNITS tablet  Take 2,000 Units by mouth daily.           Follow-up Information   Follow up with Loanne Drilling, MD. Schedule an appointment as soon as possible for a visit on 03/17/2013. (Call [phone] tomorrow to make the appointment)    Contact information:   [address] [place] [address] [phone]       Signed: Patrica Duel 03/12/2013, 10:40 AM

## 2013-04-11 DIAGNOSIS — L570 Actinic keratosis: Secondary | ICD-10-CM | POA: Insufficient documentation

## 2013-04-11 DIAGNOSIS — M329 Systemic lupus erythematosus, unspecified: Secondary | ICD-10-CM | POA: Insufficient documentation

## 2013-04-18 ENCOUNTER — Encounter (HOSPITAL_COMMUNITY): Payer: Self-pay

## 2013-04-18 ENCOUNTER — Emergency Department (HOSPITAL_COMMUNITY): Payer: Medicare Other

## 2013-04-18 ENCOUNTER — Emergency Department (HOSPITAL_COMMUNITY)
Admission: EM | Admit: 2013-04-18 | Discharge: 2013-04-19 | Disposition: A | Discharge status: Home / Self Care | Payer: Medicare Other | Attending: Emergency Medicine | Admitting: Emergency Medicine

## 2013-04-18 DIAGNOSIS — Y9389 Activity, other specified: Secondary | ICD-10-CM | POA: Insufficient documentation

## 2013-04-18 DIAGNOSIS — S81009A Unspecified open wound, unspecified knee, initial encounter: Secondary | ICD-10-CM | POA: Insufficient documentation

## 2013-04-18 DIAGNOSIS — I10 Essential (primary) hypertension: Secondary | ICD-10-CM | POA: Insufficient documentation

## 2013-04-18 DIAGNOSIS — S81011A Laceration without foreign body, right knee, initial encounter: Secondary | ICD-10-CM

## 2013-04-18 DIAGNOSIS — W108XXA Fall (on) (from) other stairs and steps, initial encounter: Secondary | ICD-10-CM | POA: Insufficient documentation

## 2013-04-18 DIAGNOSIS — IMO0001 Reserved for inherently not codable concepts without codable children: Secondary | ICD-10-CM | POA: Insufficient documentation

## 2013-04-18 DIAGNOSIS — Z8719 Personal history of other diseases of the digestive system: Secondary | ICD-10-CM | POA: Insufficient documentation

## 2013-04-18 DIAGNOSIS — Z79899 Other long term (current) drug therapy: Secondary | ICD-10-CM | POA: Insufficient documentation

## 2013-04-18 DIAGNOSIS — S91009A Unspecified open wound, unspecified ankle, initial encounter: Secondary | ICD-10-CM | POA: Insufficient documentation

## 2013-04-18 DIAGNOSIS — M129 Arthropathy, unspecified: Secondary | ICD-10-CM | POA: Insufficient documentation

## 2013-04-18 DIAGNOSIS — E785 Hyperlipidemia, unspecified: Secondary | ICD-10-CM | POA: Insufficient documentation

## 2013-04-18 DIAGNOSIS — Y92009 Unspecified place in unspecified non-institutional (private) residence as the place of occurrence of the external cause: Secondary | ICD-10-CM | POA: Insufficient documentation

## 2013-04-18 DIAGNOSIS — E039 Hypothyroidism, unspecified: Secondary | ICD-10-CM | POA: Insufficient documentation

## 2013-04-18 DIAGNOSIS — Z7982 Long term (current) use of aspirin: Secondary | ICD-10-CM | POA: Insufficient documentation

## 2013-04-18 DIAGNOSIS — M359 Systemic involvement of connective tissue, unspecified: Secondary | ICD-10-CM | POA: Insufficient documentation

## 2013-04-18 DIAGNOSIS — Z96659 Presence of unspecified artificial knee joint: Secondary | ICD-10-CM | POA: Insufficient documentation

## 2013-04-18 DIAGNOSIS — H3530 Unspecified macular degeneration: Secondary | ICD-10-CM | POA: Insufficient documentation

## 2013-04-18 NOTE — ED Notes (Signed)
Pt presents to ER with c/o knee injury. Pt fell tonight going down some stairs onto her right knee. Pt recently had knee surgery on that right knee. Pt does have ETOH on board. Pt has a deep laceration on the front of her knee.

## 2013-04-19 ENCOUNTER — Encounter (HOSPITAL_COMMUNITY): Payer: Self-pay | Admitting: Emergency Medicine

## 2013-04-19 NOTE — Discharge Instructions (Signed)

## 2013-04-19 NOTE — ED Provider Notes (Signed)
History     CSN: [id number]  Arrival date & time 04/18/13  2330   First MD Initiated Contact with Patient 04/19/13 0043      Chief Complaint  Patient presents with  . Knee Injury    (Consider location/radiation/quality/duration/timing/severity/associated sxs/prior treatment) HPI Comments: Pt had knee replacement on right side in late April, occurred aobut 7 weeks ago.  Had been drinking tonight and lost balance at the end of stairs fell forward and landed on right knee and prior incision portion inferiorly split open with subsequent pain and bleeding.  No other injuries.  No dizziness, CP, SOB, LOC.    Patient is a 67 y.o. female presenting with skin laceration. The history is provided by the patient and a relative.  Laceration Location:  Leg Leg laceration location:  R knee Length (cm):  8 Depth:  Through dermis Quality: straight   Bleeding: venous and controlled   Time since incident:  1 hour Laceration mechanism:  Fall Pain details:    Quality:  Sharp and shooting   Severity:  Moderate   Timing:  Constant   Progression:  Waxing and waning Foreign body present:  No foreign bodies Relieved by:  Nothing Worsened by:  Movement and pressure Tetanus status:  Up to date   Past Medical History  Diagnosis Date  . Fibromyalgia     followed by Dr. Corliss Skains  . Autoimmune disease     + ANA, and DS DNA--PT STATES SHE WAS TOLD SHE DOES NOT HAVE LUPUS AS PREVIOUSLY THOUGHT  . Hyperlipidemia   . Thyroid disease     Hypothyroidism  . Hypothyroidism   . Hypertension     PAST HX OF HYPERTENSION - TOOK MEDICATION--BUT OFF MEDICATION FOR YEARS-NO LONGER A PROBLEM  . Arthritis     OA AND PAIN RT KNEE  . Macular degeneration     BOTH EYES  . Hemorrhoids   . PONV (postoperative nausea and vomiting)     Past Surgical History  Procedure Laterality Date  . Cholecystectomy  1996  . Tonsillectomy  1953  . Neck surgery  2003  . Knee arthroscopy  1994    Right Knee  . Eye surgery       BILATERAL CATARACT EXTRACTION  . Abdominal hysterectomy  1989  . Bunionectomy Left 2012  . Total knee arthroplasty Right 03/02/2013    Procedure: RIGHT TOTAL KNEE ARTHROPLASTY;  Surgeon: Loanne Drilling, MD;  Location: [facility];  Service: Orthopedics;  Laterality: Right;  . Knee surgery      Family History  Problem Relation Age of Onset  . Stroke Mother   . Hypertension Mother   . Hyperlipidemia Mother   . Cancer Mother     Lung  . Stroke Father   . Hypertension Father   . Diabetes Father     Type II  . Hyperlipidemia Father   . Cancer Father     Lung  . Arthritis Father     Rheumatoid    History  Substance Use Topics  . Smoking status: Never Smoker   . Smokeless tobacco: Never Used  . Alcohol Use: Yes     Comment: OCCAS ALCOHOL    OB History   Grav Para Term Preterm Abortions TAB SAB Ect Mult Living                  Review of Systems  HENT: Negative for neck pain.   Cardiovascular: Negative for chest pain.  Gastrointestinal: Negative for nausea, vomiting and  abdominal pain.  Musculoskeletal: Positive for arthralgias. Negative for back pain.  Skin: Positive for wound.  Neurological: Negative for dizziness and syncope.  Hematological: Does not bruise/bleed easily.    Allergies  Codeine and Hydrocodone  Home Medications   Current Outpatient Rx  Name  Route  Sig  Dispense  Refill  . ALPRAZolam (XANAX) 0.5 MG tablet   Oral   Take 0.25-0.5 mg by mouth daily as needed for anxiety.          Marland Kitchen aspirin EC 81 MG tablet   Oral   Take 81 mg by mouth every evening.         Marland Kitchen levothyroxine (SYNTHROID, LEVOTHROID) 137 MCG tablet   Oral   Take 137 mcg by mouth every morning.          . methocarbamol (ROBAXIN) 500 MG tablet   Oral   Take 500 mg by mouth every 6 (six) hours as needed (muscle pain).         . Multiple Vitamins-Minerals (ICAPS PO)   Oral   Take 1 capsule by mouth 2 (two) times daily.         . pregabalin (LYRICA) 75 MG capsule    Oral   Take 75 mg by mouth every morning.          Marland Kitchen PRESCRIPTION MEDICATION   Apply externally   Apply 1 application topically daily. 30 gms dispensed; Vanicream 22gm; Ketamine 10%; Baclofen 2%; cyclobenzaprine 2%; diclofenac 3%; gabapentin 6%; lidocaine 2%; [employer] [facility] APPLIED TOPICALLY TO SKIN AREAS THAT ARE PAINFUL DUE TO FIBROMYALGIA         . topiramate (TOPAMAX) 50 MG tablet   Oral   Take 150 mg by mouth at bedtime. TAKES 3 AT BEDTIME         . traMADol (ULTRAM) 50 MG tablet   Oral   Take 1-2 tablets (50-100 mg total) by mouth every 6 (six) hours as needed for pain.   80 tablet   1   . venlafaxine (EFFEXOR) 37.5 MG tablet   Oral   Take 37.5 mg by mouth every morning.            BP 112/56  Pulse 94  Temp(Src) 98.2 F (36.8 C) (Oral)  Resp 18  SpO2 98%  Physical Exam  Nursing note and vitals reviewed. Constitutional: She is oriented to person, place, and time. She appears well-developed and well-nourished. No distress.  HENT:  Head: Normocephalic and atraumatic.  Neck: Normal range of motion. Neck supple.  Cardiovascular: Normal rate and intact distal pulses.   No murmur heard. Pulmonary/Chest: Effort normal. No respiratory distress.  Abdominal: Soft. There is no tenderness.  Musculoskeletal:       Right knee: She exhibits decreased range of motion, swelling and laceration. She exhibits no deformity. Tenderness found.       Legs: Neurological: She is alert and oriented to person, place, and time. She exhibits normal muscle tone. Coordination normal.  Skin: Skin is warm. She is not diaphoretic. No pallor.  Psychiatric: She has a normal mood and affect. Her speech is normal and behavior is normal. Thought content normal.    ED Course  LACERATION REPAIR Date/Time: 04/19/2013 1:27 AM Performed by: Lear Ng. Authorized by: Lear Ng Consent: Verbal consent obtained. Risks and benefits: risks, benefits and alternatives were  discussed Consent given by: patient Patient understanding: patient states understanding of the procedure being performed Patient consent: the patient's understanding of the procedure matches consent  given Procedure consent: procedure consent matches procedure scheduled Patient identity confirmed: verbally with patient Time out: Immediately prior to procedure a "time out" was called to verify the correct patient, procedure, equipment, support staff and site/side marked as required. Body area: lower extremity Location details: right knee Laceration length: 8 cm Foreign bodies: no foreign bodies Tendon involvement: none Nerve involvement: none Vascular damage: no Anesthesia: local infiltration Local anesthetic: lidocaine 2% with epinephrine Anesthetic total: 6 ml Patient sedated: no Preparation: Patient was prepped and draped in the usual sterile fashion. Irrigation solution: saline Irrigation method: syringe Amount of cleaning: extensive Debridement: minimal Degree of undermining: none Skin closure: staples Number of sutures: 12 Technique: simple Approximation: close Approximation difficulty: simple Dressing: 4x4 sterile gauze Patient tolerance: Patient tolerated the procedure well with no immediate complications.   (including critical care time)  Labs Reviewed - No data to display Dg Knee Complete 4 Views Right  04/19/2013   *RADIOLOGY REPORT*  Clinical Data: Pain post fall.  RIGHT KNEE - COMPLETE 4+ VIEW  Comparison: None.  Findings: Components of the right knee arthroplasty project in expected location.  Negative for fracture or dislocation.  Normal alignment.  No effusion.  IMPRESSION:  1.  Right knee arthroplasty without fracture or other apparent complication.   Original Report Authenticated By: D. Andria Rhein, MD     1. Knee laceration, right, initial encounter       MDM  Pt with reopening of prior incision from knee replacement surgery.  No FB's seen.  Cleaned,  staples placed to close wound as above.  I sent notification to Dr. Lequita Halt as well and pt understands to contact Dr. Lequita Halt on Monday for follow up.        Gavin Pound. Ballard Budney, MD 04/20/13 [phone]

## 2013-06-29 DIAGNOSIS — K219 Gastro-esophageal reflux disease without esophagitis: Secondary | ICD-10-CM | POA: Insufficient documentation

## 2014-01-25 ENCOUNTER — Other Ambulatory Visit: Payer: Self-pay

## 2014-01-25 DIAGNOSIS — Z1231 Encounter for screening mammogram for malignant neoplasm of breast: Secondary | ICD-10-CM

## 2014-02-12 ENCOUNTER — Ambulatory Visit
Admission: RE | Admit: 2014-02-12 | Discharge: 2014-02-12 | Disposition: A | Discharge status: Home / Self Care | Payer: Medicare PPO | Source: Ambulatory Visit

## 2014-02-12 ENCOUNTER — Ambulatory Visit: Payer: Medicare Other

## 2014-02-12 DIAGNOSIS — Z1231 Encounter for screening mammogram for malignant neoplasm of breast: Secondary | ICD-10-CM

## 2014-10-22 ENCOUNTER — Other Ambulatory Visit: Payer: Self-pay | Admitting: Nurse Practitioner

## 2014-10-22 DIAGNOSIS — N644 Mastodynia: Secondary | ICD-10-CM

## 2014-11-04 ENCOUNTER — Ambulatory Visit
Admission: RE | Admit: 2014-11-04 | Discharge: 2014-11-04 | Disposition: A | Discharge status: Home / Self Care | Payer: Medicare PPO | Source: Ambulatory Visit | Attending: Nurse Practitioner | Admitting: Nurse Practitioner

## 2014-11-04 ENCOUNTER — Other Ambulatory Visit: Payer: Self-pay | Admitting: Nurse Practitioner

## 2014-11-04 DIAGNOSIS — N644 Mastodynia: Secondary | ICD-10-CM

## 2015-10-28 ENCOUNTER — Other Ambulatory Visit: Payer: Self-pay

## 2015-12-13 ENCOUNTER — Other Ambulatory Visit: Payer: Self-pay

## 2015-12-13 DIAGNOSIS — Z1231 Encounter for screening mammogram for malignant neoplasm of breast: Secondary | ICD-10-CM

## 2015-12-23 ENCOUNTER — Ambulatory Visit: Payer: Medicare PPO

## 2016-02-06 ENCOUNTER — Ambulatory Visit
Admission: RE | Admit: 2016-02-06 | Discharge: 2016-02-06 | Disposition: A | Discharge status: Home / Self Care | Payer: Medicare HMO | Source: Ambulatory Visit

## 2016-02-06 DIAGNOSIS — Z1231 Encounter for screening mammogram for malignant neoplasm of breast: Secondary | ICD-10-CM

## 2016-08-24 ENCOUNTER — Encounter: Payer: Self-pay | Admitting: Rheumatology

## 2016-08-24 NOTE — Progress Notes (Deleted)
*IMAGE* Office Visit Note  Patient: Paula Murphy             Date of Birth: 1946/08/30           MRN: [id number]             PCP: Arlyss Repress, MD Referring: Arlyss Repress, MD Visit Date: 08/27/2016    Subjective:  No chief complaint on file.   History of Present Illness: Paula Murphy is a 70 y.o. female ***   Activities of Daily Living:  Patient reports morning stiffness for *** {minute/hour:19697}.   Patient {ACTIONS;DENIES/REPORTS:21021675::"Denies"} nocturnal pain.  Difficulty dressing/grooming: {ACTIONS;DENIES/REPORTS:21021675::"Denies"} Difficulty climbing stairs: {ACTIONS;DENIES/REPORTS:21021675::"Denies"} Difficulty getting out of chair: {ACTIONS;DENIES/REPORTS:21021675::"Denies"} Difficulty using hands for taps, buttons, cutlery, and/or writing: {ACTIONS;DENIES/REPORTS:21021675::"Denies"}   No Rheumatology ROS completed.   PMFS History:  Patient Active Problem List   Diagnosis Date Noted  . OA (osteoarthritis) of knee 03/02/2013  . DEPRESSION 07/24/2010  . UTI 07/04/2010  . ABDOMINAL BLOATING 01/20/2010  . 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 INSOMNIA, CHRONIC 10/04/2009  . VITAMIN D DEFICIENCY 10/26/2008  . HYPOTHYROIDISM 08/13/2008  . HYPERLIPIDEMIA 08/13/2008  . HYPERTENSION 08/13/2008  . HEADACHE 08/13/2008  . Fibromyalgia affecting multiple sites 08/12/2008    Past Medical History:  Diagnosis Date  . Arthritis    OA AND PAIN RT KNEE  . Autoimmune disease    + ANA, and DS DNA--PT STATES SHE WAS TOLD SHE DOES NOT HAVE LUPUS AS PREVIOUSLY THOUGHT  . Fibromyalgia    followed by Dr. Estanislado Pandy  . Hemorrhoids   . Hyperlipidemia   . Hypertension    PAST HX OF HYPERTENSION - TOOK MEDICATION--BUT OFF MEDICATION FOR YEARS-NO LONGER A PROBLEM  . Hypothyroidism   . Macular degeneration    BOTH EYES  . PONV (postoperative nausea and vomiting)   . Thyroid disease    Hypothyroidism    Family History  Problem Relation Age of  Onset  . Stroke Mother   . Hypertension Mother   . Hyperlipidemia Mother   . Cancer Mother     Lung  . Stroke Father   . Hypertension Father   . Diabetes Father     Type II  . Hyperlipidemia Father   . Cancer Father     Lung  . Arthritis Father     Rheumatoid   Past Surgical History:  Procedure Laterality Date  . ABDOMINAL HYSTERECTOMY  1989  . BUNIONECTOMY Left 2012  . CHOLECYSTECTOMY  1996  . EYE SURGERY     BILATERAL CATARACT EXTRACTION  . KNEE ARTHROSCOPY  1994   Right Knee  . KNEE SURGERY    . NECK SURGERY  2003  . TONSILLECTOMY  1953  . TOTAL KNEE ARTHROPLASTY Right 03/02/2013   Procedure: RIGHT TOTAL KNEE ARTHROPLASTY;  Surgeon: Gearlean Alf, MD;  Location: [facility];  Service: Orthopedics;  Laterality: Right;   Social History   Social History Narrative   Retired   Married   1 daughter/1 grandson   Never Smoked   Alcohol use - yes (social)     Objective: Vital Signs: There were no vitals taken for this visit.   Physical Exam   Musculoskeletal Exam: ***  CDAI Exam: No CDAI exam completed.    Investigation: No additional findings.   Imaging: No results found.  Speciality Comments: No specialty comments available.    Procedures:  No procedures performed Allergies: Codeine and Hydrocodone   Assessment / Plan: Visit Diagnoses: Fibromyalgia affecting multiple sites  No problem-specific Assessment & Plan notes found for this encounter.   Follow-Up Instructions: No Follow-up on file.  Orders: No orders of the defined types were placed in this encounter.  No orders of the defined types were placed in this encounter.

## 2016-08-27 ENCOUNTER — Ambulatory Visit: Payer: Self-pay | Admitting: Rheumatology

## 2016-09-13 ENCOUNTER — Ambulatory Visit: Payer: Self-pay | Admitting: Rheumatology

## 2016-10-04 ENCOUNTER — Encounter: Payer: Self-pay | Admitting: Rheumatology

## 2016-10-04 ENCOUNTER — Ambulatory Visit (INDEPENDENT_AMBULATORY_CARE_PROVIDER_SITE_OTHER): Payer: Medicare HMO | Admitting: Rheumatology

## 2016-10-04 VITALS — BP 122/63 | HR 78 | Resp 14 | Ht 61.0 in | Wt 152.0 lb

## 2016-10-04 DIAGNOSIS — M797 Fibromyalgia: Secondary | ICD-10-CM | POA: Diagnosis not present

## 2016-10-04 DIAGNOSIS — G4700 Insomnia, unspecified: Secondary | ICD-10-CM

## 2016-10-04 DIAGNOSIS — M810 Age-related osteoporosis without current pathological fracture: Secondary | ICD-10-CM

## 2016-10-04 DIAGNOSIS — R5382 Chronic fatigue, unspecified: Secondary | ICD-10-CM

## 2016-10-04 DIAGNOSIS — M62838 Other muscle spasm: Secondary | ICD-10-CM | POA: Diagnosis not present

## 2016-10-04 MED ORDER — LIDOCAINE HCL 1 % IJ SOLN
0.5000 mL | INTRAMUSCULAR | Status: AC | PRN
  Administered 2016-10-04: .5 mL

## 2016-10-04 MED ORDER — TIZANIDINE HCL 4 MG PO TABS: 4.0000 mg | ORAL_TABLET | Freq: Every day | ORAL | 1 refills | Status: AC

## 2016-10-04 MED ORDER — TOPIRAMATE 50 MG PO TABS: 150.0000 mg | ORAL_TABLET | Freq: Every day | ORAL | 1 refills | Status: DC

## 2016-10-04 MED ORDER — ALPRAZOLAM 0.5 MG PO TABS: 0.2500 mg | ORAL_TABLET | Freq: Every evening | ORAL | 2 refills | Status: DC | PRN

## 2016-10-04 MED ORDER — TRAMADOL HCL 50 MG PO TABS: 50.0000 mg | ORAL_TABLET | Freq: Three times a day (TID) | ORAL | 1 refills | Status: DC | PRN

## 2016-10-04 MED ORDER — DICLOFENAC SODIUM 1 % TD GEL: TRANSDERMAL | 3 refills | Status: DC

## 2016-10-04 MED ORDER — IBUPROFEN 800 MG PO TABS: 800.0000 mg | ORAL_TABLET | Freq: Three times a day (TID) | ORAL | 2 refills | Status: AC | PRN

## 2016-10-04 MED ORDER — TRIAMCINOLONE ACETONIDE 40 MG/ML IJ SUSP
10.0000 mg | INTRAMUSCULAR | Status: AC | PRN
  Administered 2016-10-04: 10 mg via INTRAMUSCULAR

## 2016-10-04 NOTE — Progress Notes (Signed)
Office Visit Note  Patient: Paula Murphy             Date of Birth: [birth year]           MRN: [id number]             PCP: Arlyss Repress, MD Referring: Arlyss Repress, MD Visit Date: 10/04/2016 Occupation: @GUAROCC @    Subjective:  Follow-up Fibromyalgia, osteoporosis, fatigue, insomnia.  History of Present Illness: Paula Murphy is a 70 y.o. female  Last seen 02/21/2016. Patient is under a lot of stress lately. Her brother just underwent surgery today. Also, she recently separated from her husband,  She's having significant pain to bilateral trapezius muscles, bilateral greater trochanter bursa, bilateral SI joint. She is also having pain to the other fibromyalgia tender points.  She rates her discomfort as 8 on a scale of 0-10.  She struggles with sleep and she struggles with fatigue.   She is not exercising however she stays active to manage her fibromyalgia discomfort  Activities of Daily Living:  Patient reports morning stiffness for 30 minutes.   Patient Reports nocturnal pain.  Difficulty dressing/grooming: Denies Difficulty climbing stairs: Denies Difficulty getting out of chair: Denies Difficulty using hands for taps, buttons, cutlery, and/or writing: Denies   Review of Systems  Constitutional: Positive for fatigue.  HENT: Negative for mouth sores and mouth dryness.   Eyes: Negative for dryness.  Respiratory: Negative for shortness of breath.   Gastrointestinal: Negative for constipation and diarrhea.  Musculoskeletal: Positive for myalgias and myalgias.  Skin: Negative for sensitivity to sunlight.  Psychiatric/Behavioral: Positive for sleep disturbance. Negative for decreased concentration.    PMFS History:  Patient Active Problem List   Diagnosis Date Noted  . OA (osteoarthritis) of knee 03/02/2013  . DEPRESSION 07/24/2010  . UTI 07/04/2010  . ABDOMINAL BLOATING 01/20/2010  . 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 INSOMNIA,  CHRONIC 10/04/2009  . VITAMIN D DEFICIENCY 10/26/2008  . HYPOTHYROIDISM 08/13/2008  . HYPERLIPIDEMIA 08/13/2008  . HYPERTENSION 08/13/2008  . HEADACHE 08/13/2008  . Fibromyalgia affecting multiple sites 08/12/2008    Past Medical History:  Diagnosis Date  . Arthritis    OA AND PAIN RT KNEE  . Autoimmune disease ([place])    + ANA, and DS DNA--PT STATES SHE WAS TOLD SHE DOES NOT HAVE LUPUS AS PREVIOUSLY THOUGHT  . Fibromyalgia    followed by Dr. Estanislado Pandy  . Hemorrhoids   . Hyperlipidemia   . Hypertension    PAST HX OF HYPERTENSION - TOOK MEDICATION--BUT OFF MEDICATION FOR YEARS-NO LONGER A PROBLEM  . Hypothyroidism   . Macular degeneration    BOTH EYES  . PONV (postoperative nausea and vomiting)   . Thyroid disease    Hypothyroidism    Family History  Problem Relation Age of Onset  . Stroke Mother   . Hypertension Mother   . Hyperlipidemia Mother   . Cancer Mother     Lung  . Stroke Father   . Hypertension Father   . Diabetes Father     Type II  . Hyperlipidemia Father   . Cancer Father     Lung  . Arthritis Father     Rheumatoid   Past Surgical History:  Procedure Laterality Date  . ABDOMINAL HYSTERECTOMY  1989  . BUNIONECTOMY Left 2012  . CHOLECYSTECTOMY  1996  . EYE SURGERY     BILATERAL CATARACT EXTRACTION  . KNEE ARTHROSCOPY  1994   Right Knee  . KNEE SURGERY    .  NECK SURGERY  2003  . TONSILLECTOMY  1953  . TOTAL KNEE ARTHROPLASTY Right 03/02/2013   Procedure: RIGHT TOTAL KNEE ARTHROPLASTY;  Surgeon: Gearlean Alf, MD;  Location: [facility];  Service: Orthopedics;  Laterality: Right;   Social History   Social History Narrative   Retired   Married   1 daughter/1 grandson   Never Smoked   Alcohol use - yes (social)     Objective: Vital Signs: BP 122/63 (BP Location: Left Arm, Patient Position: Sitting, Cuff Size: Large)   Pulse 78   Resp 14   Ht 5\' 1"  (1.549 m)   Wt 152 lb (68.9 kg)   BMI 28.72 kg/m    Physical Exam  Constitutional: She is  oriented to person, place, and time. She appears well-developed and well-nourished.  HENT:  Head: Normocephalic and atraumatic.  Eyes: EOM are normal. Pupils are equal, round, and reactive to light.  Cardiovascular: Normal rate, regular rhythm and normal heart sounds.  Exam reveals no gallop and no friction rub.   No murmur heard. Pulmonary/Chest: Effort normal and breath sounds normal. She has no wheezes. She has no rales.  Abdominal: Soft. Bowel sounds are normal. She exhibits no distension. There is no tenderness. There is no guarding. No hernia.  Musculoskeletal: Normal range of motion. She exhibits no edema, tenderness or deformity.  Lymphadenopathy:    She has no cervical adenopathy.  Neurological: She is alert and oriented to person, place, and time. Coordination normal.  Skin: Skin is warm and dry. Capillary refill takes less than 2 seconds. No rash noted.  Psychiatric: She has a normal mood and affect. Her behavior is normal.     Musculoskeletal Exam:  Full range of motion of all joints Grip strength is equal and strong bilaterally 18 out of 18 tender points. Having significant pain to bilateral trapezius muscles as well as bilateral SI joint as well as left greater than right greater trochanter bursa.   CDAI Exam: No CDAI exam completed.  No synovitis on examination  Investigation: No additional findings. Patient had labs done at her annual physical 09/24/2016 through her PCPs office. The labs are at [facility]. She has a copy of those labs and she states that her CBC with differential CMP with GFR normal. However I do not have a copy of those labs in am waiting for them so I can refill the medications. Please see plan for full details on the meds that I want a refill through [facility].  Imaging: No results found.  Speciality Comments: No specialty comments available.    Procedures: Bilateral trapezius muscle injection. Left is worse than the right. Trigger Point  Inj Date/Time: 10/04/2016 12:16 PM Performed by: Eliezer Lofts Authorized by: Eliezer Lofts   Consent Given by:  Patient Site marked: the procedure site was marked   Timeout: prior to procedure the correct patient, procedure, and site was verified   Indications:  Muscle spasm and pain Total # of Trigger Points:  2 Location: neck   Needle Size:  27 G Approach:  Dorsal Medications #1:  0.5 mL lidocaine 1 %; 10 mg triamcinolone acetonide 40 MG/ML Medications #2:  0.5 mL lidocaine 1 %; 10 mg triamcinolone acetonide 40 MG/ML Patient tolerance:  Patient tolerated the procedure well with no immediate complications Comments: [id number] improvement after the injection.   Allergies: Codeine and Hydrocodone   Assessment / Plan:     Visit Diagnoses: Fibromyalgia affecting multiple sites  INSOMNIA, CHRONIC  Chronic fatigue  Trapezius muscle  spasm  Osteoporosis, unspecified osteoporosis type, unspecified pathological fracture presence - pt on fosamax but forgets to take every week (10/04/2016);   Patient needs medications refilled but we do not have any updated labs. She recently went November 8 to see her PCP who did labs that included CBC with differential CMP with GFR and TSH. Patient has a copy of these labs at home and they're not in Epic at the moment. She got it done at [facility]. She will bring [place] a copy on Tuesday, 10/09/2016 and once I review those labs and find that everything is an order a be happy to refill her medications. The medicines that she needs refilled include ibuprofen, alprazolam, tramadol 1 by mouth 3 times a day, Topamax, Voltaren gel, tizanidine. All of these medicines need to go to [facility]  Patient has a history of osteoporosis but she forgets to take Fosamax 70 mg weekly. On the last visit in April I gave her 90 day supply with 4 refills. I've asked the patient to use a phone as a reminder and she is eager to do that. Hopefully she'll be able to start her Fosamax  once again and it will improve her osteoporosis status. She is due for repeat bone density approximately November/December 2018 since the last was done 09/14/2015.  Patient had trapezius muscle injection just now. After 5 minutes patient states that she is ordered a 50% improved.  I cautioned patient on the use of tramadol. She states that she is only taking 1 pill 3 times a day. I reminded her to avoid using any more than that. Patient is agreeable.  Orders: Orders Placed This Encounter  Procedures  . Trigger Point Injection  . Trigger Point Injection   No orders of the defined types were placed in this encounter.   Face-to-face time spent with patient was 30 minutes. 50% of time was spent in counseling and coordination of care.  Follow-Up Instructions: Return in about 6 months (around 04/03/2017) for [place].   Eliezer Lofts, PA-C   I examined and evaluated the patient with Eliezer Lofts PA. The plan of care was discussed as noted above.  Bo Merino, MD

## 2016-10-12 ENCOUNTER — Other Ambulatory Visit: Payer: Self-pay | Admitting: *Deleted

## 2016-11-08 ENCOUNTER — Other Ambulatory Visit: Payer: Self-pay | Admitting: Rheumatology

## 2016-11-09 ENCOUNTER — Other Ambulatory Visit: Payer: Self-pay | Admitting: *Deleted

## 2016-11-09 NOTE — Telephone Encounter (Signed)
Last Visit: 10/04/16 Next Visit: 04/02/17 UDS: 08/30/15 Narc Agreement: 09/03/16  Tramadol was just filled on 10/04/16.  Okay to refill Topiramate?

## 2016-11-12 ENCOUNTER — Other Ambulatory Visit: Payer: Self-pay | Admitting: Rheumatology

## 2016-11-12 NOTE — Telephone Encounter (Signed)
Patient uses [facility] mail order pharmacy.  She is waiting on four or five prescriptions. She states Mr Carlyon Shadow faxed them in November, but [place] says they do not have yet. Please refax  [phone]

## 2016-11-13 ENCOUNTER — Other Ambulatory Visit: Payer: Self-pay | Admitting: *Deleted

## 2016-11-13 NOTE — Telephone Encounter (Signed)
Patient states she has not received her prescription from November 2017 from [place]. Patient was given the time and dates that the prescriptions were sent to pharmacy. Patient is going to contact the pharmacy to check with them and call back if she has any problems.

## 2016-11-13 NOTE — Telephone Encounter (Signed)
Left message for patient to call the office

## 2016-12-19 ENCOUNTER — Other Ambulatory Visit: Payer: Self-pay | Admitting: Rheumatology

## 2016-12-19 ENCOUNTER — Other Ambulatory Visit: Payer: Self-pay | Admitting: *Deleted

## 2016-12-19 MED ORDER — ALPRAZOLAM 0.5 MG PO TABS: 0.2500 mg | ORAL_TABLET | Freq: Every evening | ORAL | 0 refills | Status: AC | PRN

## 2016-12-19 NOTE — Telephone Encounter (Signed)
Verified with patient prescription requesting refills on. Patient is requesting refill on  Alprazolam. According to chart patient has never been prescribed Humira.   Last Visit: 10/04/16 Next Visit: 04/02/17  Okay to refill  Alprazolam?

## 2016-12-19 NOTE — Telephone Encounter (Signed)
Patient called and requested a refill on her humeria.  She uses [facility] home mail delivery.  [id number]

## 2016-12-19 NOTE — Telephone Encounter (Signed)
Andrea,#1: Okay to refill alprazolam. I will approve that. And submitted area#2: Patient has a diagnosis of fibromyalgia. Therefore she does not need Humira (and we have never prescribed Humira).

## 2017-01-09 ENCOUNTER — Ambulatory Visit: Payer: Medicare HMO | Admitting: Rheumatology

## 2017-01-29 ENCOUNTER — Other Ambulatory Visit: Payer: Self-pay | Admitting: Internal Medicine

## 2017-01-29 DIAGNOSIS — Z1231 Encounter for screening mammogram for malignant neoplasm of breast: Secondary | ICD-10-CM

## 2017-02-22 ENCOUNTER — Ambulatory Visit
Admission: RE | Admit: 2017-02-22 | Discharge: 2017-02-22 | Disposition: A | Discharge status: Home / Self Care | Payer: Medicare PPO | Source: Ambulatory Visit | Attending: Internal Medicine | Admitting: Internal Medicine

## 2017-02-22 DIAGNOSIS — Z1231 Encounter for screening mammogram for malignant neoplasm of breast: Secondary | ICD-10-CM

## 2017-03-21 DIAGNOSIS — M810 Age-related osteoporosis without current pathological fracture: Secondary | ICD-10-CM | POA: Insufficient documentation

## 2017-03-21 DIAGNOSIS — M19072 Primary osteoarthritis, left ankle and foot: Secondary | ICD-10-CM

## 2017-03-21 DIAGNOSIS — M19071 Primary osteoarthritis, right ankle and foot: Secondary | ICD-10-CM | POA: Insufficient documentation

## 2017-03-21 DIAGNOSIS — R5383 Other fatigue: Secondary | ICD-10-CM | POA: Insufficient documentation

## 2017-03-21 DIAGNOSIS — Z8669 Personal history of other diseases of the nervous system and sense organs: Secondary | ICD-10-CM | POA: Insufficient documentation

## 2017-03-21 NOTE — Progress Notes (Signed)
Office Visit Note  Patient: Paula Murphy             Date of Birth: 1946-10-17           MRN: [id number]             PCP: Arlyss Repress, MD Referring: Arlyss Repress, MD Visit Date: 04/02/2017 Occupation: @GUAROCC @    Subjective:  Medication Management (labs drawn 03/17/17 pt has copy sent for scanning )   History of Present Illness: Paula Murphy is a 71 y.o. female  Doing very poorly since last 3 weeks (b/c overdid it since April to mid may 2018 --> worked at [employer] for  5 days; enjoyed at [employer] for 5 days; helped daughter prepare for her grandson's graduation party for 3 weeks) (also, has not been going to water aerobics).  FMS is currently "8" on scale of 0-10 (took tramadol today).  Patient is requesting a refill on tramadol and ibuprofen today.  She also plans to restart water aerobics.  She has a history of osteoporosis based on November 2016 bone density. She is due for repeat bone density mid November 2018. She'll be back in our office in September 2018 and we will make the appropriate referral at that time. In the meanwhile, she will continue Fosamax 70 mg daily. She admits that she's been taking it regularly for the last 4 months only but she had missed many doses prior to that. Also, she has been taking off and on since 2016.   Activities of Daily Living:  Patient reports morning stiffness for 15 minutes.   Patient Reports nocturnal pain.  Difficulty dressing/grooming: Reports Difficulty climbing stairs: Reports Difficulty getting out of chair: Reports Difficulty using hands for taps, buttons, cutlery, and/or writing: Reports   Review of Systems  Constitutional: Positive for fatigue.  HENT: Negative for mouth sores and mouth dryness.   Eyes: Negative for dryness.  Respiratory: Negative for shortness of breath.   Gastrointestinal: Negative for constipation and diarrhea.  Musculoskeletal: Positive for myalgias and myalgias.    Skin: Negative for sensitivity to sunlight.  Psychiatric/Behavioral: Positive for sleep disturbance. Negative for decreased concentration.    PMFS History:  Patient Active Problem List   Diagnosis Date Noted  . Primary osteoarthritis of both feet 03/21/2017  . History of macular degeneration 03/21/2017  . Other fatigue 03/21/2017  . Age-related osteoporosis without current pathological fracture 03/21/2017  . OA (osteoarthritis) of knee 03/02/2013  . DEPRESSION 07/24/2010  . UTI 07/04/2010  . ABDOMINAL BLOATING 01/20/2010  . Fibromyalgia 10/04/2009  . INSOMNIA, CHRONIC 10/04/2009  . Vitamin D deficiency 10/26/2008  . HYPOTHYROIDISM 08/13/2008  . HYPERLIPIDEMIA 08/13/2008  . HYPERTENSION 08/13/2008  . HEADACHE 08/13/2008    Past Medical History:  Diagnosis Date  . Arthritis    OA AND PAIN RT KNEE  . Autoimmune disease ([place])    + ANA, and DS DNA--PT STATES SHE WAS TOLD SHE DOES NOT HAVE LUPUS AS PREVIOUSLY THOUGHT  . Fibromyalgia    followed by Dr. Estanislado Pandy  . Hemorrhoids   . Hyperlipidemia   . Hypertension    PAST HX OF HYPERTENSION - TOOK MEDICATION--BUT OFF MEDICATION FOR YEARS-NO LONGER A PROBLEM  . Hypothyroidism   . Macular degeneration    BOTH EYES  . PONV (postoperative nausea and vomiting)   . Thyroid disease    Hypothyroidism    Family History  Problem Relation Age of Onset  . Stroke Mother   . Hypertension Mother   .  Hyperlipidemia Mother   . Cancer Mother        Lung  . Stroke Father   . Hypertension Father   . Diabetes Father        Type II  . Hyperlipidemia Father   . Cancer Father        Lung  . Arthritis Father        Rheumatoid   Past Surgical History:  Procedure Laterality Date  . ABDOMINAL HYSTERECTOMY  1989  . BUNIONECTOMY Left 2012  . CHOLECYSTECTOMY  1996  . EYE SURGERY     BILATERAL CATARACT EXTRACTION  . KNEE ARTHROSCOPY  1994   Right Knee  . KNEE SURGERY    . NECK SURGERY  2003  . TONSILLECTOMY  1953  . TOTAL KNEE  ARTHROPLASTY Right 03/02/2013   Procedure: RIGHT TOTAL KNEE ARTHROPLASTY;  Surgeon: Gearlean Alf, MD;  Location: [facility];  Service: Orthopedics;  Laterality: Right;   Social History   Social History Narrative   Retired   Married   1 daughter/1 grandson   Never Smoked   Alcohol use - yes (social)     Objective: Vital Signs: BP 118/72   Pulse 70   Resp 16   Wt 156 lb (70.8 kg)   BMI 29.48 kg/m    Physical Exam  Constitutional: She is oriented to person, place, and time. She appears well-developed and well-nourished.  HENT:  Head: Normocephalic and atraumatic.  Eyes: EOM are normal. Pupils are equal, round, and reactive to light.  Cardiovascular: Normal rate, regular rhythm and normal heart sounds.  Exam reveals no gallop and no friction rub.   No murmur heard. Pulmonary/Chest: Effort normal and breath sounds normal. She has no wheezes. She has no rales.  Abdominal: Soft. Bowel sounds are normal. She exhibits no distension. There is no tenderness. There is no guarding. No hernia.  Musculoskeletal: Normal range of motion. She exhibits no edema, tenderness or deformity.  Lymphadenopathy:    She has no cervical adenopathy.  Neurological: She is alert and oriented to person, place, and time. Coordination normal.  Skin: Skin is warm and dry. Capillary refill takes less than 2 seconds. No rash noted.  Psychiatric: She has a normal mood and affect. Her behavior is normal.  Nursing note and vitals reviewed.  patient examined by Dr. Estanislado Pandy  Musculoskeletal Exam:  FROM of all joints Unable to fully extend bilateral knees per Dr. Arlean Hopping exam Fiber myalgia tender points are 18 out of 18 positive  CDAI Exam: CDAI Homunculus Exam:   Joint Counts:  CDAI Tender Joint count: 0 CDAI Swollen Joint count: 0  No synovitis on examination per Dr. Estanislado Pandy   Investigation: No additional findings.  Patient brought in recent labs that she had done at at [facility]. Labs drawn on  03/18/2017 Date of birth [birth year] CMP with GFR is within normal limits with GFR at 76, creatinine at 0.79; AST at 18, ALT at 11. Lipid panel is within normal limits TSH is elevated at 6.47 (patient does have hypothyroidism CBC with differential is normal  Imaging: No results found.  Speciality Comments: No specialty comments available.    Procedures:  No procedures performed Allergies: Codeine and Hydrocodone   Assessment / Plan:     Visit Diagnoses: Fibromyalgia  INSOMNIA, CHRONIC  Vitamin D deficiency - Plan: VITAMIN D 25 Hydroxy (Vit-D Deficiency, Fractures)  History of hypertension  History of hypothyroidism  History of depression  Age-related osteoporosis without current pathological fracture  Other fatigue  Primary osteoarthritis of both feet  History of macular degeneration  Autoimmune disease ([place]) - Plan: C3 and C4, Anti-DNA antibody, double-stranded, ANA, Sedimentation rate, Urinalysis, Routine w reflex microscopic  Pain management - Plan: Pain Mgmt, Profile 5 w/Conf, U   Plan: #1: Fiber myalgia syndrome. Flaring currently. Patient has been very active and caused exacerbation of her fibromyalgia over the last 2 months due to her high activity levels. Please see history of present illness for full details Patient is requesting refill on tramadol and ibuprofen for pain management of her fibromyalgia We encouraged the patient to do water aerobics to minimize her pain and discomfort  #2: Insomnia and fatigue. Ongoing.  #3: Osteoporosis. Patient's last bone density was November 2016. She is due for repeat DEXA mid-November of 2018. We will schedule the patient when we see her in late September/early October 2018  #4: Return to clinic in late September or early October for enthesis no patient has to go to the furniture market in [place] in October and will not be able to come October 2018  #5: History of abnormal autoimmune workup in 2009. Later  autoimmune workup showed normal levels of ANA and double-stranded DNA. Today, patient is requesting repeat autoimmune labs to find her status. She read an article that showed thyroid is related to fibromyalgia.  #5:  Medications: Patient is using tramadol and ibuprofen for pain management. Note we advised the patient the addictive nature of tramadol and patient is well aware since she has a relative who became addicted to narcotic medication. She also uses ibuprofen 800 mg about twice a day. I've advised the patient the risk of GI upset/bleed due to overuse of ibuprofen and to minimize ibuprofen. Patient is agreeable. The last time we refilled ibuprofen we gave her 270 pills with 1 refill, I advised her that we should decrease the quantity. Patient is agreeable. I've given her 180 pills with 1 refill Labs: We will do ANA with titer, double-stranded DNA, sedimentation rate, C3-C4, vitamin D, urine drug screen, urinalysis today in office to recheck her autoimmune status  Orders: Orders Placed This Encounter  Procedures  . VITAMIN D 25 Hydroxy (Vit-D Deficiency, Fractures)  . C3 and C4  . Anti-DNA antibody, double-stranded  . ANA  . Sedimentation rate  . Urinalysis, Routine w reflex microscopic  . Pain Mgmt, Profile 5 w/Conf, U   Meds ordered this encounter  Medications  . ibuprofen (ADVIL,MOTRIN) 800 MG tablet    Sig: 800 mg twice a day when necessary pain (caution with GI upset)    Dispense:  180 tablet    Refill:  1    Order Specific Question:   Supervising Provider    Answer:   Lyda Perone  . traMADol (ULTRAM) 50 MG tablet    Sig: TAKE 1 TO 2 TABLETS TWICE DAILY AS NEEDED    Dispense:  360 tablet    Refill:  0    Order Specific Question:   Supervising Provider    Answer:   Lyda Perone    Face-to-face time spent with patient was 9minutes. 50% of time was spent in counseling and coordination of care.  Follow-Up Instructions: Return in about 4 months  (around 08/03/2017) for FMS, FATIGUE,INSOMNIA, A.D.(at one time), oporosis,, OSTEOPROSIS.   Eliezer Lofts, PA-C  Note - This record has been created using [employer].  Chart creation errors have been sought, but may not always  have been located. Such creation errors do not reflect on  the standard of medical care. 

## 2017-04-02 ENCOUNTER — Ambulatory Visit (INDEPENDENT_AMBULATORY_CARE_PROVIDER_SITE_OTHER): Payer: Medicare HMO | Admitting: Rheumatology

## 2017-04-02 ENCOUNTER — Encounter: Payer: Self-pay | Admitting: Rheumatology

## 2017-04-02 VITALS — BP 118/72 | HR 70 | Resp 16 | Wt 156.0 lb

## 2017-04-02 DIAGNOSIS — G4700 Insomnia, unspecified: Secondary | ICD-10-CM

## 2017-04-02 DIAGNOSIS — E559 Vitamin D deficiency, unspecified: Secondary | ICD-10-CM | POA: Diagnosis not present

## 2017-04-02 DIAGNOSIS — R5383 Other fatigue: Secondary | ICD-10-CM | POA: Diagnosis not present

## 2017-04-02 DIAGNOSIS — Z8639 Personal history of other endocrine, nutritional and metabolic disease: Secondary | ICD-10-CM | POA: Diagnosis not present

## 2017-04-02 DIAGNOSIS — R52 Pain, unspecified: Secondary | ICD-10-CM | POA: Diagnosis not present

## 2017-04-02 DIAGNOSIS — Z8679 Personal history of other diseases of the circulatory system: Secondary | ICD-10-CM

## 2017-04-02 DIAGNOSIS — D8989 Other specified disorders involving the immune mechanism, not elsewhere classified: Secondary | ICD-10-CM

## 2017-04-02 DIAGNOSIS — Z8659 Personal history of other mental and behavioral disorders: Secondary | ICD-10-CM | POA: Diagnosis not present

## 2017-04-02 DIAGNOSIS — M19071 Primary osteoarthritis, right ankle and foot: Secondary | ICD-10-CM

## 2017-04-02 DIAGNOSIS — M797 Fibromyalgia: Secondary | ICD-10-CM | POA: Diagnosis not present

## 2017-04-02 DIAGNOSIS — Z8669 Personal history of other diseases of the nervous system and sense organs: Secondary | ICD-10-CM | POA: Diagnosis not present

## 2017-04-02 DIAGNOSIS — M810 Age-related osteoporosis without current pathological fracture: Secondary | ICD-10-CM

## 2017-04-02 DIAGNOSIS — M359 Systemic involvement of connective tissue, unspecified: Secondary | ICD-10-CM

## 2017-04-02 DIAGNOSIS — M19072 Primary osteoarthritis, left ankle and foot: Secondary | ICD-10-CM

## 2017-04-02 MED ORDER — TRAMADOL HCL 50 MG PO TABS: ORAL_TABLET | ORAL | 0 refills | Status: DC

## 2017-04-02 MED ORDER — IBUPROFEN 800 MG PO TABS: ORAL_TABLET | ORAL | 1 refills | Status: DC

## 2017-04-03 LAB — SEDIMENTATION RATE: SED RATE: 1 mm/h (ref 0–30)

## 2017-04-03 LAB — C3 AND C4
C3 Complement: 127 mg/dL (ref 83–193)
C4 Complement: 23 mg/dL (ref 15–57)

## 2017-04-03 LAB — VITAMIN D 25 HYDROXY (VIT D DEFICIENCY, FRACTURES): Vit D, 25-Hydroxy: 23 ng/mL — ABNORMAL LOW (ref 30–100)

## 2017-04-03 LAB — URINALYSIS, MICROSCOPIC ONLY
Casts: NONE SEEN [LPF]
Yeast: NONE SEEN [HPF]

## 2017-04-03 LAB — URINALYSIS, ROUTINE W REFLEX MICROSCOPIC
BILIRUBIN URINE: NEGATIVE
Glucose, UA: NEGATIVE
HGB URINE DIPSTICK: NEGATIVE
KETONES UR: NEGATIVE
Nitrite: POSITIVE — AB
PROTEIN: NEGATIVE
Specific Gravity, Urine: 1.026 (ref 1.001–1.035)
pH: 5 (ref 5.0–8.0)

## 2017-04-03 LAB — ANTI-NUCLEAR AB-TITER (ANA TITER)

## 2017-04-03 LAB — ANTI-DNA ANTIBODY, DOUBLE-STRANDED: ds DNA Ab: 43 IU/mL — ABNORMAL HIGH

## 2017-04-03 LAB — ANA: ANA: POSITIVE — AB

## 2017-04-06 LAB — PAIN MGMT, PROFILE 5 W/CONF, U
AMINOCLONAZEPAM: NEGATIVE ng/mL (ref <25)
AMPHETAMINES: NEGATIVE ng/mL (ref <500)
Alphahydroxyalprazolam: 72 ng/mL — ABNORMAL HIGH (ref <25)
Alphahydroxymidazolam: NEGATIVE ng/mL (ref <50)
Alphahydroxytriazolam: NEGATIVE ng/mL (ref <50)
Barbiturates: NEGATIVE ng/mL (ref <300)
Benzodiazepines: POSITIVE ng/mL — AB (ref <100)
Cocaine Metabolite: NEGATIVE ng/mL (ref <150)
Creatinine: 217.4 mg/dL (ref >=20.0)
Hydroxyethylflurazepam: NEGATIVE ng/mL (ref <50)
Lorazepam: NEGATIVE ng/mL (ref <50)
Marijuana Metabolite: NEGATIVE ng/mL (ref <20)
Methadone Metabolite: NEGATIVE ng/mL (ref <100)
Nordiazepam: NEGATIVE ng/mL (ref <50)
OXYCODONE: NEGATIVE ng/mL (ref <100)
Opiates: NEGATIVE ng/mL (ref <100)
Oxazepam: NEGATIVE ng/mL (ref <50)
Oxidant: NEGATIVE ug/mL (ref <200)
PH: 6.61 (ref 4.5–9.0)
TEMAZEPAM: NEGATIVE ng/mL (ref <50)

## 2017-04-08 ENCOUNTER — Telehealth: Payer: Self-pay | Admitting: *Deleted

## 2017-04-08 MED ORDER — VITAMIN D (ERGOCALCIFEROL) 1.25 MG (50000 UNIT) PO CAPS: 50000.0000 [IU] | ORAL_CAPSULE | ORAL | 0 refills | Status: DC

## 2017-04-08 NOTE — Telephone Encounter (Signed)
Patient advised of lab results and prescription sent to pharmacy.

## 2017-04-08 NOTE — Telephone Encounter (Signed)
-----   Message from [place], [place] sent at 04/08/2017 12:51 PM EDT ----- Please forward labs to PCP An tell patient #1: Urine drug screen is consistent with her medication (positive benzodiazepine)  #2: Sedimentation rate is normal  #3: C3-C4 normal  #4: ANAs positive with a titer of 1:180  #5: Double-stranded DNA is positive at 43  #6: Microscopic urine shows positive bacteria(patient should follow with her PCP for evaluation and treatment; has a trace of leukocyte esterase.  #7: Vitamin D is low at 23; please offer the patient the following prescription vitamin D3 50,000 IUs once a week 12 weeks; dispense 12 pills with no refill; recheck vitamin D 25 OH in 3 months

## 2017-05-06 ENCOUNTER — Other Ambulatory Visit: Payer: Self-pay | Admitting: Rheumatology

## 2017-05-06 NOTE — Telephone Encounter (Signed)
Last Visit: 04/02/17 Next Visit: 08/24/17  Okay to refill Xanax?

## 2017-05-06 NOTE — Telephone Encounter (Signed)
Last Visit: 04/02/17 Next Visit: 08/24/17 Labs: 03/18/17 WNL  Okay to refill per Dr. Estanislado Pandy

## 2017-06-27 ENCOUNTER — Other Ambulatory Visit: Payer: Self-pay | Admitting: Rheumatology

## 2017-06-27 NOTE — Telephone Encounter (Signed)
Last Visit: 04/02/17 Next Visit: 08/24/17  Okay to refill per Dr. Estanislado Pandy

## 2017-07-03 ENCOUNTER — Other Ambulatory Visit: Payer: Self-pay | Admitting: *Deleted

## 2017-07-03 DIAGNOSIS — E559 Vitamin D deficiency, unspecified: Secondary | ICD-10-CM

## 2017-07-04 ENCOUNTER — Telehealth: Payer: Self-pay | Admitting: *Deleted

## 2017-07-04 DIAGNOSIS — E559 Vitamin D deficiency, unspecified: Secondary | ICD-10-CM

## 2017-07-04 LAB — VITAMIN D 25 HYDROXY (VIT D DEFICIENCY, FRACTURES): Vit D, 25-Hydroxy: 26 ng/mL — ABNORMAL LOW (ref 30–100)

## 2017-07-04 MED ORDER — VITAMIN D (ERGOCALCIFEROL) 1.25 MG (50000 UNIT) PO CAPS: 50000.0000 [IU] | ORAL_CAPSULE | ORAL | 0 refills | Status: DC

## 2017-07-04 NOTE — Telephone Encounter (Signed)
-----   Message from Bo Merino, MD sent at 07/04/2017  1:16 PM EDT ----- Vit D 50,000U twice a week. #90d . Recheck level in 3 mths.

## 2017-07-04 NOTE — Progress Notes (Signed)
Vit D 50,000U twice a week. #90d . Recheck level in 3 mths.

## 2017-07-20 ENCOUNTER — Other Ambulatory Visit: Payer: Self-pay | Admitting: Rheumatology

## 2017-07-22 NOTE — Telephone Encounter (Signed)
ok 

## 2017-07-22 NOTE — Telephone Encounter (Signed)
Last Visit: 04/02/17 Next Visit: 08/24/17 UDS: 04/02/17 Narc Agreement: 04/02/17 Last Fill: 04/02/17  Okay to refill Tramadol?

## 2017-08-06 ENCOUNTER — Ambulatory Visit: Payer: Medicare HMO | Admitting: Rheumatology

## 2017-08-14 NOTE — Progress Notes (Signed)
Office Visit Note  Patient: Paula Murphy             Date of Birth: [birth year]           MRN: [id number]             PCP: Arlyss Repress, MD Referring: Arlyss Repress, MD Visit Date: 08/27/2017 Occupation: @GUAROCC @    Subjective:  Pain between shoulder blades.   History of Present Illness: Paula Murphy is a 71 y.o. female with history of fibromyalgia and osteoarthritis. She states she's been having pain and discomfort between her shoulder blades. She describes the pain to be ordered to severe. She states it interferes with her daily activities. Her headaches are better on Topamax. She has some discomfort in her left knee. Her right total knee replacement is doing better. She's not had much discomfort with her shoulders and her feet recently. She's been taking Fosamax on a regular basis and tolerating it well  Activities of Daily Living:  Patient reports morning stiffness for 15 minutes.   Patient Denies nocturnal pain.  Difficulty dressing/grooming: Denies Difficulty climbing stairs: Reports Difficulty getting out of chair: Denies Difficulty using hands for taps, buttons, cutlery, and/or writing: Reports   Review of Systems  Constitutional: Positive for fatigue. Negative for night sweats, weight gain, weight loss and weakness.  HENT: Negative.  Negative for mouth sores, trouble swallowing, trouble swallowing, mouth dryness and nose dryness.   Eyes: Negative.  Negative for pain, redness, visual disturbance and dryness.  Respiratory: Positive for shortness of breath. Negative for cough and difficulty breathing.   Cardiovascular: Negative.  Negative for chest pain, palpitations, hypertension, irregular heartbeat and swelling in legs/feet.  Gastrointestinal: Negative.  Negative for blood in stool, constipation and diarrhea.  Endocrine: Negative for increased urination.  Genitourinary: Negative for vaginal dryness.  Musculoskeletal: Positive for myalgias, morning stiffness and  myalgias. Negative for arthralgias, joint pain, joint swelling, muscle weakness and muscle tenderness.  Skin: Negative.  Negative for color change, rash, hair loss, skin tightness, ulcers and sensitivity to sunlight.  Allergic/Immunologic: Negative for susceptible to infections.  Neurological: Negative for dizziness, numbness, headaches, memory loss and night sweats.  Hematological: Negative for swollen glands.  Psychiatric/Behavioral: Negative.  Negative for depressed mood and sleep disturbance. The patient is not nervous/anxious.     PMFS History:  Patient Active Problem List   Diagnosis Date Noted  . Primary osteoarthritis of both feet 03/21/2017  . History of macular degeneration 03/21/2017  . Other fatigue 03/21/2017  . Age-related osteoporosis without current pathological fracture 03/21/2017  . OA (osteoarthritis) of knee 03/02/2013  . DEPRESSION 07/24/2010  . UTI 07/04/2010  . ABDOMINAL BLOATING 01/20/2010  . Fibromyalgia 10/04/2009  . INSOMNIA, CHRONIC 10/04/2009  . Vitamin D deficiency 10/26/2008  . HYPOTHYROIDISM 08/13/2008  . HYPERLIPIDEMIA 08/13/2008  . HYPERTENSION 08/13/2008  . HEADACHE 08/13/2008    Past Medical History:  Diagnosis Date  . Arthritis    OA AND PAIN RT KNEE  . Autoimmune disease ([place])    + ANA, and DS DNA--PT STATES SHE WAS TOLD SHE DOES NOT HAVE LUPUS AS PREVIOUSLY THOUGHT  . Fibromyalgia    followed by Dr. Estanislado Pandy  . Hemorrhoids   . Hyperlipidemia   . Hypertension    PAST HX OF HYPERTENSION - TOOK MEDICATION--BUT OFF MEDICATION FOR YEARS-NO LONGER A PROBLEM  . Hypothyroidism   . Macular degeneration    BOTH EYES  . PONV (postoperative nausea and vomiting)   . Thyroid disease  Hypothyroidism    Family History  Problem Relation Age of Onset  . Stroke Mother   . Hypertension Mother   . Hyperlipidemia Mother   . Cancer Mother        Lung  . Stroke Father   . Hypertension Father   . Diabetes Father        Type II  .  Hyperlipidemia Father   . Cancer Father        Lung  . Arthritis Father        Rheumatoid   Past Surgical History:  Procedure Laterality Date  . ABDOMINAL HYSTERECTOMY  1989  . BUNIONECTOMY Left 2012  . CHOLECYSTECTOMY  1996  . EYE SURGERY     BILATERAL CATARACT EXTRACTION  . KNEE ARTHROSCOPY  1994   Right Knee  . KNEE SURGERY    . NECK SURGERY  2003  . TONSILLECTOMY  1953  . TOTAL KNEE ARTHROPLASTY Right 03/02/2013   Procedure: RIGHT TOTAL KNEE ARTHROPLASTY;  Surgeon: Gearlean Alf, MD;  Location: [facility];  Service: Orthopedics;  Laterality: Right;   Social History   Social History Narrative   Retired   Married   1 daughter/1 grandson   Never Smoked   Alcohol use - yes (social)     Objective: Vital Signs: BP 118/73 (BP Location: Left Arm, Patient Position: Sitting, Cuff Size: Normal)   Pulse 77   Ht 5\' 1"  (1.549 m)   Wt 158 lb (71.7 kg)   BMI 29.85 kg/m    Physical Exam  Constitutional: She is oriented to person, place, and time. She appears well-developed and well-nourished.  HENT:  Head: Normocephalic and atraumatic.  Eyes: Conjunctivae and EOM are normal.  Neck: Normal range of motion.  Cardiovascular: Normal rate, regular rhythm, normal heart sounds and intact distal pulses.   Pulmonary/Chest: Effort normal and breath sounds normal.  Abdominal: Soft. Bowel sounds are normal.  Lymphadenopathy:    She has no cervical adenopathy.  Neurological: She is alert and oriented to person, place, and time.  Skin: Skin is warm and dry. Capillary refill takes less than 2 seconds.  Psychiatric: She has a normal mood and affect. Her behavior is normal.  Nursing note and vitals reviewed.    Musculoskeletal Exam: C-spine and thoracic lumbar spine good range of motion. She had tenderness over bilateral trapezius area. Shoulder joints elbow joints wrist joint MCPs PIPs DIPs with good range of motion. No warmth or swelling was noted. Hip joints knee joints ankles MTPs PIPs  with good range of motion with no synovitis. Her right knee joint is replaced which is doing well. She tenderness on palpation over bilateral trochanteric bursa.  CDAI Exam: No CDAI exam completed.    Investigation: No additional findings. UDS: 03/2017   Imaging: No results found.  Speciality Comments: No specialty comments available.    Procedures:  No procedures performed Allergies: Codeine and Hydrocodone   Assessment / Plan:     Visit Diagnoses: Fibromyalgia -patient is having a flare with increased pain and discomfort all over. She states she's unable to function without tramadol. She takes tramadol 50 mg 1-2 tabs po bid, UDS: 03/2017 and tizanidine 4 mg by mouth daily at bedtime. She states tizanidine makes her drowsy symptoms the next day. I've advised her to reduce the dose into half tablet of tizanidine at bedtime she'll be 2 mg. Need for regular exercise was emphasized.  History of insomnia: Better with medications.  History of fatigue: She's experiencing significant fatigue currently.  Neck pain: She had bilateral trapezius is spasm and discomfort. Trigger point injections are not covered by her insurance. I will refer her to physical therapy for that.  Trochanteric bursitis of both hips: She continues to have pain and discomfort over bilateral trochanteric area. Physical therapy will be useful.  Primary osteoarthritis of both knees: She has some discomfort in her left knee joint.  Status post total right knee replacement: Doing well  Primary osteoarthritis of both feet: Proper fitting shoes has been helpful.  Age-related osteoporosis without current pathological fracture - on Fosamax 70 mg po q wk.on 11/09/2016DEXA T score -2.6 right femoral neckBMD [facility]. She is tolerating Fosamax well. We will schedule a repeat DEXA scan in December 2018.  History of vitamin D deficiency: She is on vitamin D supplement.  History of headache - Topamax 150 mg po qhs. Her  headaches are better on Topamax.  Anxiety and depression - Xanax0.5 mg by mouth daily at bedtime when necessary.  History of hypertension: Her blood pressure is better controlled.  Other medical problems are listed as follows:  History of macular degeneration  History of hyperlipidemia  History of hypothyroidism    Orders: Orders Placed This Encounter  Procedures  . DG BONE DENSITY (DXA)  . Ambulatory referral to Physical Therapy   Meds ordered this encounter  Medications  . diclofenac sodium (VOLTAREN) 1 % GEL    Sig: Voltaren Gel 3 grams to 3 large joints upto TID 3 TUBES with 3 refills    Dispense:  3 Tube    Refill:  3    Face-to-face time spent with patient was 30 minutes. Greater than 50% of time was spent in counseling and coordination of care.  Follow-Up Instructions: Return in about 6 months (around 02/25/2018) for Osteoarthritis FMS.   Bo Merino, MD  Note - This record has been created using [employer].  Chart creation errors have been sought, but may not always  have been located. Such creation errors do not reflect on  the standard of medical care.

## 2017-08-21 ENCOUNTER — Ambulatory Visit: Payer: Medicare HMO | Admitting: Rheumatology

## 2017-08-27 ENCOUNTER — Encounter: Payer: Self-pay | Admitting: Rheumatology

## 2017-08-27 ENCOUNTER — Ambulatory Visit (INDEPENDENT_AMBULATORY_CARE_PROVIDER_SITE_OTHER): Payer: Medicare HMO | Admitting: Rheumatology

## 2017-08-27 VITALS — BP 118/73 | HR 77 | Ht 61.0 in | Wt 158.0 lb

## 2017-08-27 DIAGNOSIS — M797 Fibromyalgia: Secondary | ICD-10-CM

## 2017-08-27 DIAGNOSIS — Z87898 Personal history of other specified conditions: Secondary | ICD-10-CM | POA: Diagnosis not present

## 2017-08-27 DIAGNOSIS — Z8669 Personal history of other diseases of the nervous system and sense organs: Secondary | ICD-10-CM | POA: Diagnosis not present

## 2017-08-27 DIAGNOSIS — M19071 Primary osteoarthritis, right ankle and foot: Secondary | ICD-10-CM | POA: Diagnosis not present

## 2017-08-27 DIAGNOSIS — M810 Age-related osteoporosis without current pathological fracture: Secondary | ICD-10-CM

## 2017-08-27 DIAGNOSIS — M7061 Trochanteric bursitis, right hip: Secondary | ICD-10-CM

## 2017-08-27 DIAGNOSIS — Z8679 Personal history of other diseases of the circulatory system: Secondary | ICD-10-CM | POA: Diagnosis not present

## 2017-08-27 DIAGNOSIS — M19072 Primary osteoarthritis, left ankle and foot: Secondary | ICD-10-CM

## 2017-08-27 DIAGNOSIS — M7062 Trochanteric bursitis, left hip: Secondary | ICD-10-CM

## 2017-08-27 DIAGNOSIS — F419 Anxiety disorder, unspecified: Secondary | ICD-10-CM

## 2017-08-27 DIAGNOSIS — M542 Cervicalgia: Secondary | ICD-10-CM

## 2017-08-27 DIAGNOSIS — Z8639 Personal history of other endocrine, nutritional and metabolic disease: Secondary | ICD-10-CM

## 2017-08-27 DIAGNOSIS — M170 Bilateral primary osteoarthritis of knee: Secondary | ICD-10-CM

## 2017-08-27 DIAGNOSIS — F32A Depression, unspecified: Secondary | ICD-10-CM

## 2017-08-27 DIAGNOSIS — Z96651 Presence of right artificial knee joint: Secondary | ICD-10-CM

## 2017-08-27 DIAGNOSIS — F329 Major depressive disorder, single episode, unspecified: Secondary | ICD-10-CM

## 2017-08-27 MED ORDER — DICLOFENAC SODIUM 1 % TD GEL: TRANSDERMAL | 3 refills | Status: DC

## 2017-08-27 NOTE — Patient Instructions (Addendum)
Iliotibial Bursitis Rehab Ask your health care provider which exercises are safe for you. Do exercises exactly as told by your health care provider and adjust them as directed. It is normal to feel mild stretching, pulling, tightness, or discomfort as you do these exercises, but you should stop right away if you feel sudden pain or your pain gets worse.Do not begin these exercises until told by your health care provider. Stretching and range of motion exercises These exercises warm up your muscles and joints and improve the movement and flexibility of your leg. These exercises also help to relieve pain and stiffness. Exercise A: Quadriceps stretch, prone  1. Lie on your abdomen on a firm surface, such as a bed or padded floor. 2. Bend your left / right knee and hold your ankle. If you cannot reach your ankle or pant leg, loop a belt around your foot and grab the belt instead. 3. Gently pull your heel toward your buttocks. Your knee should not slide out to the side. You should feel a stretch in the front of your thigh and knee. 4. Hold this position for __________ seconds. Repeat __________ times. Complete this exercise __________ times a day. Exercise B: Lunge ( adductor stretch) 1. Stand and spread your legs about 3 feet (about 1 m) apart. Put your left / right leg slightly back for balance. 2. Lean away from your left / right leg by bending your other knee and shifting your weight toward your bent knee. You may rest your hands on your thigh for balance. You should feel a stretch in your left / right inner thigh. 3. Hold for __________ seconds. Repeat __________ times. Complete this exercise __________ times a day. Exercise C: Hamstring stretch, supine  1. Lie on your back. 2. Hold both ends of a belt or towel as you loop it over the ball of your left / right foot. The ball of your foot is on the walking surface, right under your toes. 3. Straighten your left / right knee and slowly pull on  the belt to raise your leg. Stop when you feel a gentle stretch in the back of your left / right knee or thigh. ? Do not let your left / right knee bend. ? Keep your other leg flat on the floor. 4. Hold this position for __________ seconds. Repeat __________ times. Complete this exercise __________ times a day. Strengthening exercises These exercises build strength and endurance in your leg. Endurance is the ability to use your muscles for a long time, even after they get tired. Exercise D: Quadriceps wall slides  1. Lean your back against a smooth wall or door while you walk your feet out 18-24 inches (46-61 cm) from it. 2. Place your feet hip-width apart. 3. Slowly slide down the wall or door until your knees bend as far as told by your health care provider. Keep your knees over your heels, not your toes. Keep your knees in line with your hips. 4. Hold for __________ seconds. 5. Push through your heels to stand up to rest for __________ seconds after each repetition. Repeat __________ times. Complete this exercise __________ times a day. Exercise E: Straight leg raises ( hip abductors) 1. Lie on your side, with your left / right leg in the top position. Lie so your head, shoulder, knee, and hip line up with each other. You may bend your bottom knee to help you balance. 2. Lift your top leg 4-6 inches (10-15 cm) while keeping your   toes pointed straight ahead. 3. Hold this position for __________ seconds. 4. Slowly lower your leg to the starting position. Allow your muscles to relax completely after each repetition. Repeat __________ times. Complete this exercise __________ times a day. Exercise F: Straight leg raises ( hip extensors) 1. Lie on your abdomen on a firm surface. You can put a pillow under your hips if that is more comfortable. 2. Tense the muscles in your buttocks and lift your left / right leg about 4-6 inches (10-15 cm). Keep your knee straight as you lift your leg. 3. Hold  this position for __________ seconds. 4. Slowly lower your leg to the starting position. 5. Let your leg relax completely after each repetition. Repeat __________ times. Complete this exercise __________ times a day. Exercise G: Bridge ( hip extensors) 1. Lie on your back on a firm surface with your knees bent and your feet flat on the floor. 2. Tighten your buttocks muscles and lift your bottom off the floor until your trunk is level with your thighs. ? Do not arch your back. ? You should feel the muscles working in your buttocks and the back of your thighs. If you do not feel these muscles, slide your feet 1-2 inches (2.5-5 cm) farther away from your buttocks. 3. Hold this position for __________ seconds. 4. Slowly lower your hips to the starting position. 5. Let your buttocks muscles relax completely between repetitions. 6. If this exercise is too easy, try doing it with your arms crossed over your chest. Repeat __________ times. Complete this exercise __________ times a day. This information is not intended to replace advice given to you by your health care provider. Make sure you discuss any questions you have with your health care provider. Document Released: 10/22/2005 Document Revised: 06/28/2016 Document Reviewed: 10/04/2015 Elsevier Interactive Patient Education  2018 [place]. Cervical Strain and Sprain Rehab Ask your health care provider which exercises are safe for you. Do exercises exactly as told by your health care provider and adjust them as directed. It is normal to feel mild stretching, pulling, tightness, or discomfort as you do these exercises, but you should stop right away if you feel sudden pain or your pain gets worse.Do not begin these exercises until told by your health care provider. Stretching and range of motion exercises These exercises warm up your muscles and joints and improve the movement and flexibility of your neck. These exercises also help to relieve  pain, numbness, and tingling. Exercise A: Cervical side bend  1. Using good posture, sit on a stable chair or stand up. 2. Without moving your shoulders, slowly tilt your left / right ear to your shoulder until you feel a stretch in your neck muscles. You should be looking straight ahead. 3. Hold for __________ seconds. 4. Repeat with the other side of your neck. Repeat __________ times. Complete this exercise __________ times a day. Exercise B: Cervical rotation  1. Using good posture, sit on a stable chair or stand up. 2. Slowly turn your head to the side as if you are looking over your left / right shoulder. ? Keep your eyes level with the ground. ? Stop when you feel a stretch along the side and the back of your neck. 3. Hold for __________ seconds. 4. Repeat this by turning to your other side. Repeat __________ times. Complete this exercise __________ times a day. Exercise C: Thoracic extension and pectoral stretch 1. Roll a towel or a small blanket so it is  about 4 inches (10 cm) in diameter. 2. Lie down on your back on a firm surface. 3. Put the towel lengthwise, under your spine in the middle of your back. It should not be not under your shoulder blades. The towel should line up with your spine from your middle back to your lower back. 4. Put your hands behind your head and let your elbows fall out to your sides. 5. Hold for __________ seconds. Repeat __________ times. Complete this exercise __________ times a day. Strengthening exercises These exercises build strength and endurance in your neck. Endurance is the ability to use your muscles for a long time, even after your muscles get tired. Exercise D: Upper cervical flexion, isometric 1. Lie on your back with a thin pillow behind your head and a small rolled-up towel under your neck. 2. Gently tuck your chin toward your chest and nod your head down to look toward your feet. Do not lift your head off the pillow. 3. Hold for  __________ seconds. 4. Release the tension slowly. Relax your neck muscles completely before you repeat this exercise. Repeat __________ times. Complete this exercise __________ times a day. Exercise E: Cervical extension, isometric  1. Stand about 6 inches (15 cm) away from a wall, with your back facing the wall. 2. Place a soft object, about 6-8 inches (15-20 cm) in diameter, between the back of your head and the wall. A soft object could be a small pillow, a ball, or a folded towel. 3. Gently tilt your head back and press into the soft object. Keep your jaw and forehead relaxed. 4. Hold for __________ seconds. 5. Release the tension slowly. Relax your neck muscles completely before you repeat this exercise. Repeat __________ times. Complete this exercise __________ times a day. Posture and body mechanics  Body mechanics refers to the movements and positions of your body while you do your daily activities. Posture is part of body mechanics. Good posture and healthy body mechanics can help to relieve stress in your body's tissues and joints. Good posture means that your spine is in its natural S-curve position (your spine is neutral), your shoulders are pulled back slightly, and your head is not tipped forward. The following are general guidelines for applying improved posture and body mechanics to your everyday activities. Standing  When standing, keep your spine neutral and keep your feet about hip-width apart. Keep a slight bend in your knees. Your ears, shoulders, and hips should line up.  When you do a task in which you stand in one place for a long time, place one foot up on a stable object that is 2-4 inches (5-10 cm) high, such as a footstool. This helps keep your spine neutral. Sitting   When sitting, keep your spine neutral and your keep feet flat on the floor. Use a footrest, if necessary, and keep your thighs parallel to the floor. Avoid rounding your shoulders, and avoid tilting  your head forward.  When working at a desk or a computer, keep your desk at a height where your hands are slightly lower than your elbows. Slide your chair under your desk so you are close enough to maintain good posture.  When working at a computer, place your monitor at a height where you are looking straight ahead and you do not have to tilt your head forward or downward to look at the screen. Resting When lying down and resting, avoid positions that are most painful for you. Try to support your  neck in a neutral position. You can use a contour pillow or a small rolled-up towel. Your pillow should support your neck but not push on it. This information is not intended to replace advice given to you by your health care provider. Make sure you discuss any questions you have with your health care provider. Document Released: 10/22/2005 Document Revised: 06/28/2016 Document Reviewed: 09/28/2015 Elsevier Interactive Patient Education  2018 [place]. Back Exercises The following exercises strengthen the muscles that help to support the back. They also help to keep the lower back flexible. Doing these exercises can help to prevent back pain or lessen existing pain. If you have back pain or discomfort, try doing these exercises 2-3 times each day or as told by your health care provider. When the pain goes away, do them once each day, but increase the number of times that you repeat the steps for each exercise (do more repetitions). If you do not have back pain or discomfort, do these exercises once each day or as told by your health care provider. Exercises Single Knee to Chest  Repeat these steps 3-5 times for each leg: 5. Lie on your back on a firm bed or the floor with your legs extended. 6. Bring one knee to your chest. Your other leg should stay extended and in contact with the floor. 7. Hold your knee in place by grabbing your knee or thigh. 8. Pull on your knee until you feel a gentle  stretch in your lower back. 9. Hold the stretch for 10-30 seconds. 10. Slowly release and straighten your leg.  Pelvic Tilt  Repeat these steps 5-10 times: 1. Lie on your back on a firm bed or the floor with your legs extended. 2. Bend your knees so they are pointing toward the ceiling and your feet are flat on the floor. 3. Tighten your lower abdominal muscles to press your lower back against the floor. This motion will tilt your pelvis so your tailbone points up toward the ceiling instead of pointing to your feet or the floor. 4. With gentle tension and even breathing, hold this position for 5-10 seconds.  Cat-Cow  Repeat these steps until your lower back becomes more flexible: 6. Get into a hands-and-knees position on a firm surface. Keep your hands under your shoulders, and keep your knees under your hips. You may place padding under your knees for comfort. 7. Let your head hang down, and point your tailbone toward the floor so your lower back becomes rounded like the back of a cat. 8. Hold this position for 5 seconds. 9. Slowly lift your head and point your tailbone up toward the ceiling so your back forms a sagging arch like the back of a cow. 10. Hold this position for 5 seconds.  Press-Ups  Repeat these steps 5-10 times: 5. Lie on your abdomen (face-down) on the floor. 6. Place your palms near your head, about shoulder-width apart. 7. While you keep your back as relaxed as possible and keep your hips on the floor, slowly straighten your arms to raise the top half of your body and lift your shoulders. Do not use your back muscles to raise your upper torso. You may adjust the placement of your hands to make yourself more comfortable. 8. Hold this position for 5 seconds while you keep your back relaxed. 9. Slowly return to lying flat on the floor.  Bridges  Repeat these steps 10 times: 6. Lie on your back on a firm surface.  7. Bend your knees so they are pointing toward the  ceiling and your feet are flat on the floor. 8. Tighten your buttocks muscles and lift your buttocks off of the floor until your waist is at almost the same height as your knees. You should feel the muscles working in your buttocks and the back of your thighs. If you do not feel these muscles, slide your feet 1-2 inches farther away from your buttocks. 9. Hold this position for 3-5 seconds. 10. Slowly lower your hips to the starting position, and allow your buttocks muscles to relax completely.  If this exercise is too easy, try doing it with your arms crossed over your chest. Abdominal Crunches  Repeat these steps 5-10 times: 1. Lie on your back on a firm bed or the floor with your legs extended. 2. Bend your knees so they are pointing toward the ceiling and your feet are flat on the floor. 3. Cross your arms over your chest. 4. Tip your chin slightly toward your chest without bending your neck. 5. Tighten your abdominal muscles and slowly raise your trunk (torso) high enough to lift your shoulder blades a tiny bit off of the floor. Avoid raising your torso higher than that, because it can put too much stress on your low back and it does not help to strengthen your abdominal muscles. 6. Slowly return to your starting position.  Back Lifts Repeat these steps 5-10 times: 1. Lie on your abdomen (face-down) with your arms at your sides, and rest your forehead on the floor. 2. Tighten the muscles in your legs and your buttocks. 3. Slowly lift your chest off of the floor while you keep your hips pressed to the floor. Keep the back of your head in line with the curve in your back. Your eyes should be looking at the floor. 4. Hold this position for 3-5 seconds. 5. Slowly return to your starting position.  Contact a health care provider if:  Your back pain or discomfort gets much worse when you do an exercise.  Your back pain or discomfort does not lessen within 2 hours after you exercise. If  you have any of these problems, stop doing these exercises right away. Do not do them again unless your health care provider says that you can. Get help right away if:  You develop sudden, severe back pain. If this happens, stop doing the exercises right away. Do not do them again unless your health care provider says that you can. This information is not intended to replace advice given to you by your health care provider. Make sure you discuss any questions you have with your health care provider. Document Released: 11/29/2004 Document Revised: 02/29/2016 Document Reviewed: 12/16/2014 Elsevier Interactive Patient Education  2017 [employer].

## 2017-10-18 ENCOUNTER — Other Ambulatory Visit: Payer: Self-pay

## 2017-10-18 ENCOUNTER — Encounter: Payer: Self-pay | Admitting: Rheumatology

## 2017-10-18 DIAGNOSIS — E559 Vitamin D deficiency, unspecified: Secondary | ICD-10-CM

## 2017-10-19 LAB — VITAMIN D 25 HYDROXY (VIT D DEFICIENCY, FRACTURES): VIT D 25 HYDROXY: 29 ng/mL — AB (ref 30–100)

## 2017-10-21 IMAGING — MG MM SCREEN MAMMOGRAM BILATERAL
6 series · 6 of 6 positions shown · non-contrast
Comparison: Previous exam(s).

CLINICAL DATA: Screening.

EXAM:
DIGITAL SCREENING BILATERAL MAMMOGRAM WITH CAD

[R CC]
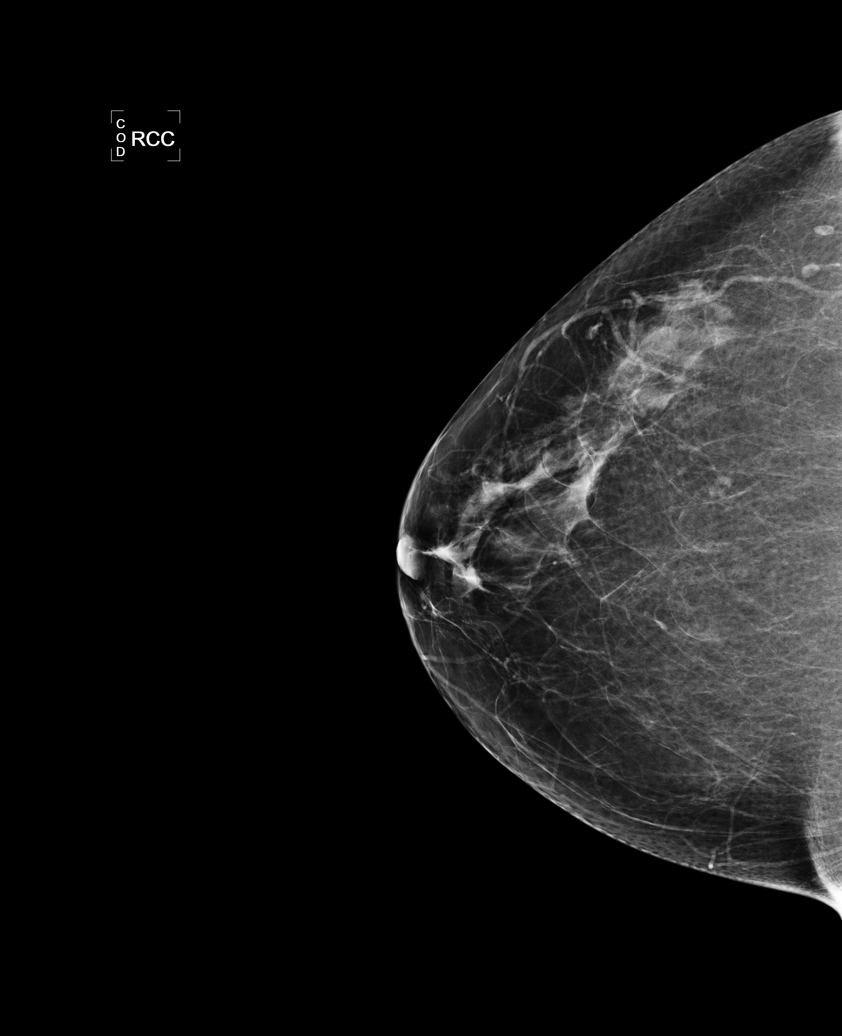

[L CC]
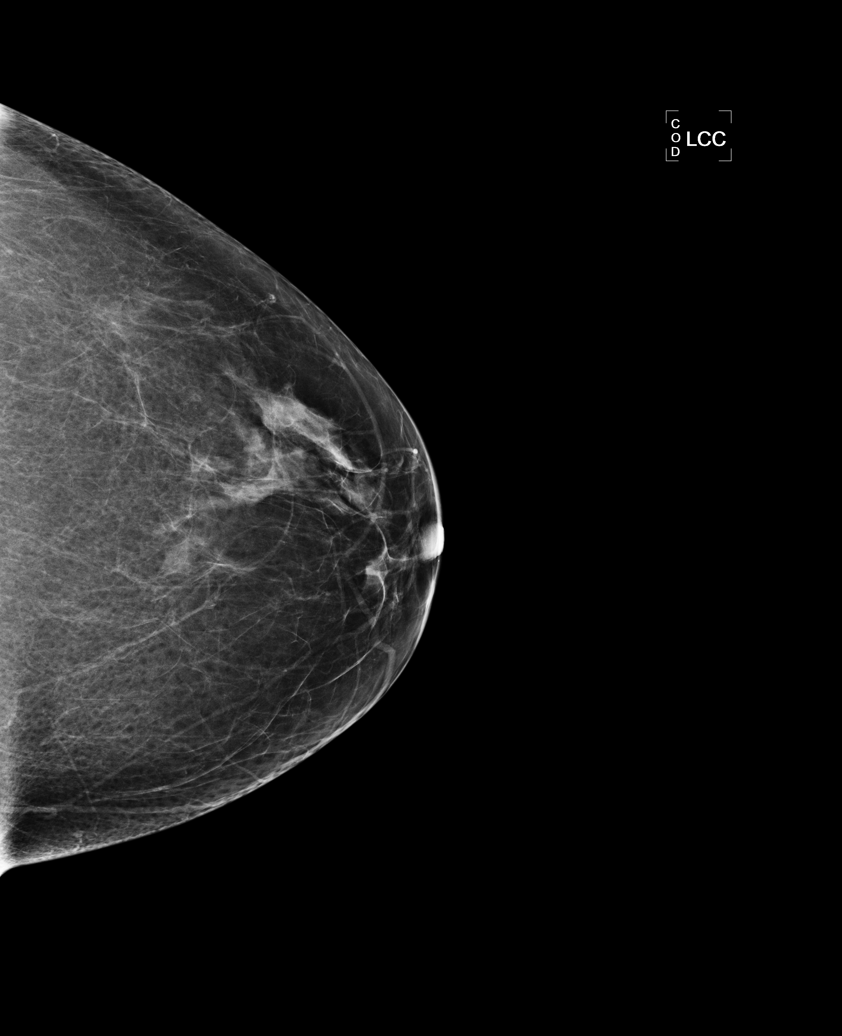

[L MLO (1 of 2)]
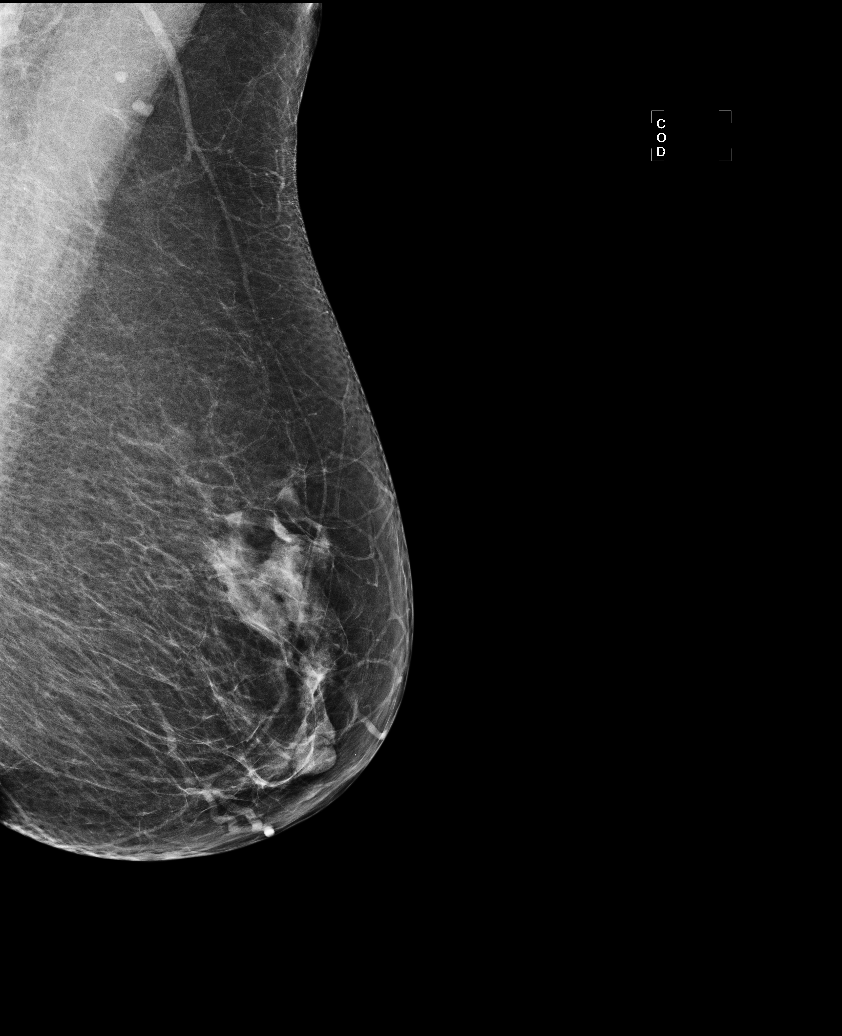

[R MLO (1 of 2)]
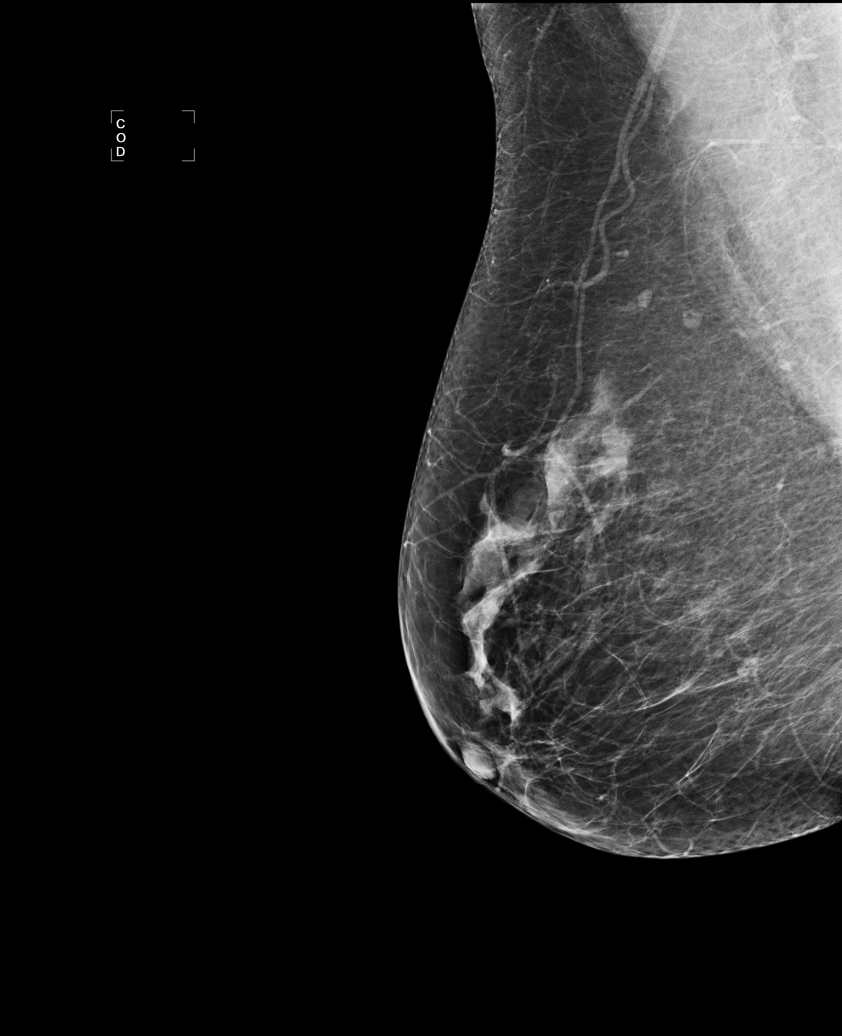

[L MLO (2 of 2)]
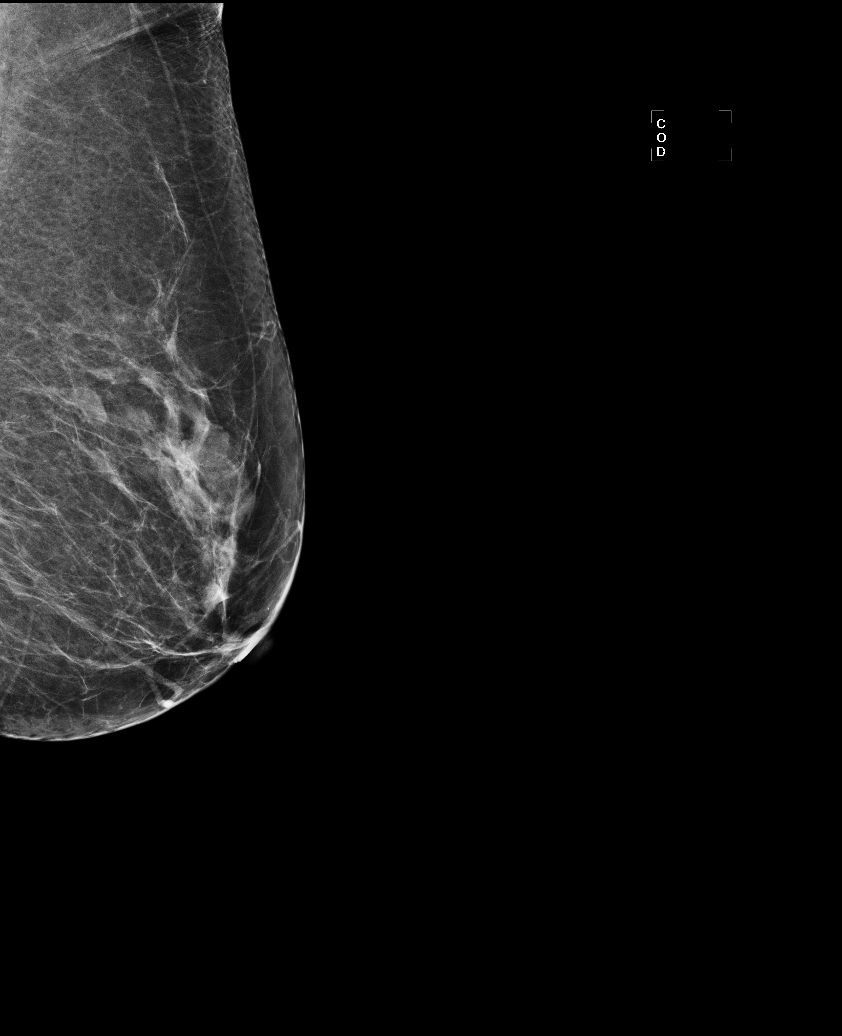

[R MLO (2 of 2)]
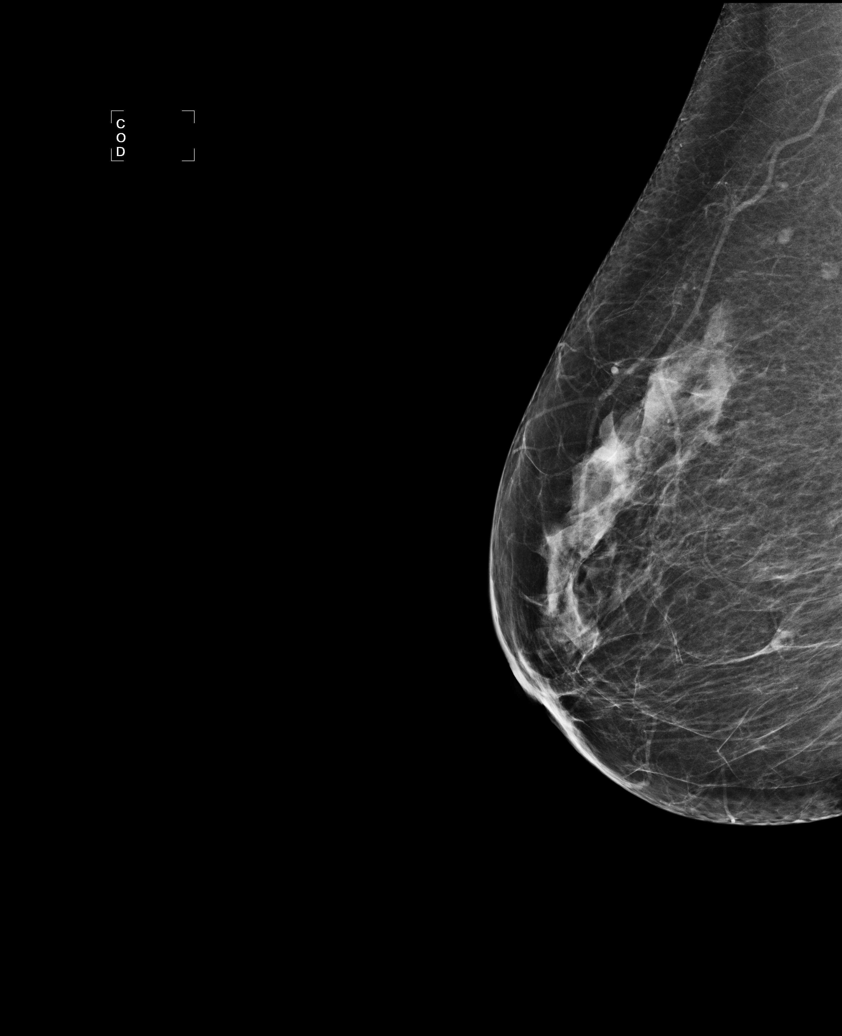

[6 of 6 positions shown; findings below may reference images not displayed]

ACR Breast Density Category b: There are scattered areas of
fibroglandular density.
FINDINGS: There are no findings suspicious for malignancy. Images were
processed with CAD.
IMPRESSION: No mammographic evidence of malignancy. A result letter of this
screening mammogram will be mailed directly to the patient.

RECOMMENDATION:
Screening mammogram in one year. (Code:AS-G-LCT)

BI-RADS CATEGORY  1: Negative.

## 2017-10-25 ENCOUNTER — Telehealth: Payer: Self-pay | Admitting: *Deleted

## 2017-10-25 DIAGNOSIS — E559 Vitamin D deficiency, unspecified: Secondary | ICD-10-CM

## 2017-10-25 MED ORDER — VITAMIN D (ERGOCALCIFEROL) 1.25 MG (50000 UNIT) PO CAPS: 50000.0000 [IU] | ORAL_CAPSULE | ORAL | 0 refills | Status: DC

## 2017-10-25 NOTE — Telephone Encounter (Signed)
-----   Message from Ofilia Neas, PA-C sent at 10/21/2017  3:58 PM EST ----- Vitamin D remains low at 29.  Instruct patient to take Vitamin D 50,000 IU twice weekly.  Recheck vitamin D level in 3 months.

## 2017-11-04 ENCOUNTER — Other Ambulatory Visit: Payer: Self-pay | Admitting: *Deleted

## 2017-11-04 NOTE — Telephone Encounter (Signed)
Refill request received via fax  Last Visit: 08/27/17 Next Visit: 02/26/18 UDS: 04/02/17 Narc Agreement: 04/02/17  Okay to refill Tramadol, Topamax and Ibuprofen?

## 2017-11-06 MED ORDER — IBUPROFEN 800 MG PO TABS: ORAL_TABLET | ORAL | 1 refills | Status: DC

## 2017-11-06 MED ORDER — TOPIRAMATE 50 MG PO TABS: 150.0000 mg | ORAL_TABLET | Freq: Every day | ORAL | 1 refills | Status: DC

## 2017-11-06 MED ORDER — TRAMADOL HCL 50 MG PO TABS: ORAL_TABLET | ORAL | 2 refills | Status: DC

## 2017-11-06 NOTE — Telephone Encounter (Signed)
ok 

## 2017-11-13 ENCOUNTER — Telehealth: Payer: Self-pay | Admitting: Rheumatology

## 2017-11-13 NOTE — Telephone Encounter (Signed)
Patient advised her prescription was not written for 28 tablets. Patient advised that her prescription was written for the 120 tablets and that the pharmacy may have just sent that amount to make sure she did not run out while they got clarification on the prescription.

## 2017-11-13 NOTE — Telephone Encounter (Signed)
Patient calling in reference to Tramadol rx. Patient states she normally gets 120 pills, but only got 28 tabs this refill. Patient would like to know why. Please call to advise.

## 2017-12-05 ENCOUNTER — Telehealth: Payer: Self-pay | Admitting: *Deleted

## 2017-12-05 NOTE — Telephone Encounter (Signed)
Osteoporosis  T-Score -2.9  Osteoporosis on Fosamax sonce 02/2016  Recommendation to change to Recalst. Will schedule appointment to discuss.

## 2017-12-06 ENCOUNTER — Telehealth: Payer: Self-pay

## 2017-12-06 NOTE — Telephone Encounter (Signed)
Patient advised of results and recommendations. Patient has been scheduled to be seen on 12/25/17.  Please do a benefits investigation on Reclast.

## 2017-12-06 NOTE — Telephone Encounter (Signed)
Was asked to submit a BIV on RECLAST for pt.   She has Medicare/[employer]. No pre-cert is required. Patient is responsible for 20% coinsurance with an out of pocket maximum of $3400. After the out of pocket maximum is paid, all services will be covered at 100%. Patient has accumulated $35.   Demetrios Loll, CPhT 4:27 PM

## 2017-12-11 NOTE — Progress Notes (Signed)
Office Visit Note  Patient: Paula Murphy             Date of Birth: 05/27/1946           MRN: [id number]             PCP: Arlyss Repress, MD Referring: Arlyss Repress, MD Visit Date: 12/25/2017 Occupation: @GUAROCC @    Subjective:  Other (discuss reclast )   History of Present Illness: Paula Murphy is a 72 y.o. female with history of osteoporosis, osteoarthritis and fibromyalgia syndrome.  She comes today to discuss treatment options for osteoporosis.  She has been taking Fosamax on a regular basis and recently increased her dose of vitamin D.  She was going for physical therapy for fibromyalgia which was very effective.  She is to switch providers for physical therapy.  She has been having some thoracic pain.  She continues to have some discomfort in her joints from underlying osteoarthritis.  Trochanteric bursitis continues to cause a lot of discomfort.  Activities of Daily Living:  Patient reports morning stiffness for 5 minutes.   Patient Reports nocturnal pain.  Difficulty dressing/grooming: Denies Difficulty climbing stairs: Reports Difficulty getting out of chair: Denies Difficulty using hands for taps, buttons, cutlery, and/or writing: Reports   Review of Systems  Constitutional: Negative for fatigue, night sweats, weight gain, weight loss and weakness.  HENT: Negative for mouth sores, trouble swallowing, trouble swallowing, mouth dryness and nose dryness.   Eyes: Negative for pain, redness, visual disturbance and dryness.  Respiratory: Negative for cough, shortness of breath and difficulty breathing.   Cardiovascular: Negative for chest pain, palpitations, hypertension, irregular heartbeat and swelling in legs/feet.  Gastrointestinal: Negative for blood in stool, constipation, diarrhea and nausea.  Endocrine: Negative for heat intolerance and increased urination.  Genitourinary: Negative for pelvic pain and vaginal dryness.  Musculoskeletal: Positive for  arthralgias, joint pain and morning stiffness. Negative for joint swelling, myalgias, muscle weakness, muscle tenderness and myalgias.  Skin: Negative for color change, rash, hair loss, redness, skin tightness, ulcers and sensitivity to sunlight.  Allergic/Immunologic: Negative for susceptible to infections.  Neurological: Negative for dizziness, memory loss and night sweats.  Hematological: Negative for bruising/bleeding tendency and swollen glands.  Psychiatric/Behavioral: Negative for depressed mood and sleep disturbance. The patient is not nervous/anxious.     PMFS History:  Patient Active Problem List   Diagnosis Date Noted  . Primary osteoarthritis of both feet 03/21/2017  . History of macular degeneration 03/21/2017  . Other fatigue 03/21/2017  . Age-related osteoporosis without current pathological fracture 03/21/2017  . OA (osteoarthritis) of knee 03/02/2013  . DEPRESSION 07/24/2010  . UTI 07/04/2010  . ABDOMINAL BLOATING 01/20/2010  . Fibromyalgia 10/04/2009  . INSOMNIA, CHRONIC 10/04/2009  . Vitamin D deficiency 10/26/2008  . HYPOTHYROIDISM 08/13/2008  . HYPERLIPIDEMIA 08/13/2008  . HYPERTENSION 08/13/2008  . HEADACHE 08/13/2008    Past Medical History:  Diagnosis Date  . Arthritis    OA AND PAIN RT KNEE  . 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 Fibromyalgia    followed by Dr. Estanislado Pandy  . Hemorrhoids   . Hyperlipidemia   . Hypertension    PAST HX OF HYPERTENSION - TOOK MEDICATION--BUT OFF MEDICATION FOR YEARS-NO LONGER A PROBLEM  . Hypothyroidism   . Macular degeneration    BOTH EYES  . PONV (postoperative nausea and vomiting)   . Thyroid disease  Hypothyroidism    Family History  Problem Relation Age of Onset  . Stroke Mother   . Hypertension Mother   . Hyperlipidemia Mother   . Cancer Mother        Lung  . Alzheimer's disease Mother   . Stroke Father   . Hypertension  Father   . Diabetes Father        Type II  . Hyperlipidemia Father   . Cancer Father        Lung  . Arthritis Father        Rheumatoid  . Thyroid disease Daughter    Past Surgical History:  Procedure Laterality Date  . ABDOMINAL HYSTERECTOMY  1989  . BUNIONECTOMY Left 2012  . CHOLECYSTECTOMY  1996  . EYE SURGERY     BILATERAL CATARACT EXTRACTION  . KNEE ARTHROSCOPY  1994   Right Knee  . KNEE SURGERY    . NECK SURGERY  2003  . TONSILLECTOMY  1953  . TOTAL KNEE ARTHROPLASTY Right 03/02/2013   Procedure: RIGHT TOTAL KNEE ARTHROPLASTY;  Surgeon: Gearlean Alf, MD;  Location: [facility];  Service: Orthopedics;  Laterality: Right;   Social History   Social History Narrative   Retired   Married   1 daughter/1 grandson   Never Smoked   Alcohol use - yes (social)     Objective: Vital Signs: BP 128/72 (BP Location: Left Arm, Patient Position: Sitting, Cuff Size: Normal)   Pulse 64   Resp 16   Ht 5\' 1"  (1.549 m)   Wt 161 lb (73 kg)   BMI 30.42 kg/m    Physical Exam  Constitutional: She is oriented to person, place, and time. She appears well-developed and well-nourished.  HENT:  Head: Normocephalic and atraumatic.  Eyes: Conjunctivae and EOM are normal.  Neck: Normal range of motion.  Cardiovascular: Normal rate, regular rhythm, normal heart sounds and intact distal pulses.  Pulmonary/Chest: Effort normal and breath sounds normal.  Abdominal: Soft. Bowel sounds are normal.  Lymphadenopathy:    She has no cervical adenopathy.  Neurological: She is alert and oriented to person, place, and time.  Skin: Skin is warm and dry. Capillary refill takes less than 2 seconds.  Psychiatric: She has a normal mood and affect. Her behavior is normal.  Nursing note and vitals reviewed.    Musculoskeletal Exam: C-spine thoracic lumbar spine good range of motion.  Shoulder joints, elbow joints, wrist joints, MCPs PIPs DIPs with good range of motion.  She has some tenderness over the  right CMC joint.  Hip joints knee joints with good range of motion.  She has right total knee replacement with limited extension.  She has some osteoarthritic changes in her feet.  She has positive tender points for fibromyalgia.  CDAI Exam: No CDAI exam completed.    Investigation: Findings:  October 18, 2018 DEXA at [facility] Right femoral neck T score -2.9, BMD 0.527, -5% change in BMD Left femoral neck T score -2.4, BMD 0.579, -7% change in BMD AP total spine T score -1.0, BMD 0.936, +5% change in BMD    Imaging: No results found.  Speciality Comments: No specialty comments available.    Procedures:  No procedures performed Allergies: Codeine and Hydrocodone   Assessment / Plan:     Visit Diagnoses: Age-related osteoporosis without current pathological fracture -  Fosamax 70 mg po q wk.on 11/09/2016DEXA T score -2.6 right femoral neckBMD 0.557 at [facility]. She is tolerating Fosamax well.  Her BMD has decreased on  her most recent bone density done in 2018.  We had detailed discussion regarding the.  We discussed the option of switching her to Reclast IV.  Medication side effects contraindications were discussed.  We will apply for IV Reclast.  History of vitamin D deficiency - She is on vitamin D supplement.  She will finish the course of vitamin D and we will check levels of vitamin D after that.  Primary osteoarthritis of left knee: Chronic pain  Status post total right knee replacement: She has limited extension but discomfort is better.  Primary osteoarthritis of both feet: Proper fitting shoes were discussed.  Trochanteric bursitis of both hips: She had good response to physical therapy but due to insurance she would have to switch her physical therapy provider.  Have given her prescription for that.  Fibromyalgia -on tramadol.  Patient states that tramadol helps her to do her daily chores.  UDS: 5/29/2018Narc agreement: 04/02/2017 .  She continues to have some  discomfort.  History of macular degeneration  Other fatigue  Primary insomnia  History of hypothyroidism  History of headache - Topamax 150 mg po qhs. Her headaches are better on Topamax  History of hyperlipidemia  History of hypertension  History of anxiety - Xanax0.5 mg by mouth daily at bedtime when necessary.  History of depression    Orders: No orders of the defined types were placed in this encounter.  No orders of the defined types were placed in this encounter.   Face-to-face time spent with patient was 30 minutes.  Greater than 50% of time was spent in counseling and coordination of care.  Follow-Up Instructions: Return in about 6 months (around 06/24/2018) for Osteoarthritis, Osteoporosis.   Bo Merino, MD  Note - This record has been created using Editor, commissioning.  Chart creation errors have been sought, but may not always  have been located. Such creation errors do not reflect on  the standard of medical care.

## 2017-12-25 ENCOUNTER — Encounter: Payer: Self-pay | Admitting: Rheumatology

## 2017-12-25 ENCOUNTER — Ambulatory Visit: Payer: Medicare HMO | Admitting: Rheumatology

## 2017-12-25 VITALS — BP 128/72 | HR 64 | Resp 16 | Ht 61.0 in | Wt 161.0 lb

## 2017-12-25 DIAGNOSIS — M7062 Trochanteric bursitis, left hip: Secondary | ICD-10-CM

## 2017-12-25 DIAGNOSIS — Z8669 Personal history of other diseases of the nervous system and sense organs: Secondary | ICD-10-CM

## 2017-12-25 DIAGNOSIS — F5101 Primary insomnia: Secondary | ICD-10-CM | POA: Diagnosis not present

## 2017-12-25 DIAGNOSIS — M810 Age-related osteoporosis without current pathological fracture: Secondary | ICD-10-CM | POA: Diagnosis not present

## 2017-12-25 DIAGNOSIS — R5383 Other fatigue: Secondary | ICD-10-CM | POA: Diagnosis not present

## 2017-12-25 DIAGNOSIS — Z8639 Personal history of other endocrine, nutritional and metabolic disease: Secondary | ICD-10-CM | POA: Diagnosis not present

## 2017-12-25 DIAGNOSIS — Z8679 Personal history of other diseases of the circulatory system: Secondary | ICD-10-CM | POA: Diagnosis not present

## 2017-12-25 DIAGNOSIS — M7061 Trochanteric bursitis, right hip: Secondary | ICD-10-CM

## 2017-12-25 DIAGNOSIS — M797 Fibromyalgia: Secondary | ICD-10-CM

## 2017-12-25 DIAGNOSIS — M19071 Primary osteoarthritis, right ankle and foot: Secondary | ICD-10-CM

## 2017-12-25 DIAGNOSIS — M1712 Unilateral primary osteoarthritis, left knee: Secondary | ICD-10-CM

## 2017-12-25 DIAGNOSIS — Z8659 Personal history of other mental and behavioral disorders: Secondary | ICD-10-CM

## 2017-12-25 DIAGNOSIS — Z87898 Personal history of other specified conditions: Secondary | ICD-10-CM | POA: Diagnosis not present

## 2017-12-25 DIAGNOSIS — Z96651 Presence of right artificial knee joint: Secondary | ICD-10-CM

## 2017-12-25 DIAGNOSIS — M19072 Primary osteoarthritis, left ankle and foot: Secondary | ICD-10-CM

## 2017-12-25 NOTE — Patient Instructions (Signed)

## 2018-01-22 ENCOUNTER — Other Ambulatory Visit: Payer: Self-pay

## 2018-01-22 ENCOUNTER — Other Ambulatory Visit: Payer: Self-pay | Admitting: *Deleted

## 2018-01-22 DIAGNOSIS — Z79899 Other long term (current) drug therapy: Secondary | ICD-10-CM

## 2018-01-22 DIAGNOSIS — E559 Vitamin D deficiency, unspecified: Secondary | ICD-10-CM

## 2018-01-23 ENCOUNTER — Telehealth: Payer: Self-pay

## 2018-01-23 LAB — CBC WITH DIFFERENTIAL/PLATELET
BASOS PCT: 0.6 %
Basophils Absolute: 31 cells/uL (ref 0–200)
EOS ABS: 229 {cells}/uL (ref 15–500)
Eosinophils Relative: 4.4 %
HCT: 36.1 % (ref 35.0–45.0)
HEMOGLOBIN: 12.1 g/dL (ref 11.7–15.5)
Lymphs Abs: 2517 cells/uL (ref 850–3900)
MCH: 31.2 pg (ref 27.0–33.0)
MCHC: 33.5 g/dL (ref 32.0–36.0)
MCV: 93 fL (ref 80.0–100.0)
MONOS PCT: 7.3 %
MPV: 10.5 fL (ref 7.5–12.5)
NEUTROS ABS: 2044 {cells}/uL (ref 1500–7800)
Neutrophils Relative %: 39.3 %
PLATELETS: 241 10*3/uL (ref 140–400)
RBC: 3.88 10*6/uL (ref 3.80–5.10)
RDW: 12.2 % (ref 11.0–15.0)
TOTAL LYMPHOCYTE: 48.4 %
WBC: 5.2 10*3/uL (ref 3.8–10.8)
WBCMIX: 380 {cells}/uL (ref 200–950)

## 2018-01-23 LAB — COMPLETE METABOLIC PANEL WITH GFR
AG RATIO: 1.5 (calc) (ref 1.0–2.5)
ALT: 11 U/L (ref 6–29)
AST: 15 U/L (ref 10–35)
Albumin: 3.8 g/dL (ref 3.6–5.1)
Alkaline phosphatase (APISO): 51 U/L (ref 33–130)
BUN: 23 mg/dL (ref 7–25)
CALCIUM: 9 mg/dL (ref 8.6–10.4)
CO2: 27 mmol/L (ref 20–32)
CREATININE: 0.86 mg/dL (ref 0.60–0.93)
Chloride: 107 mmol/L (ref 98–110)
GFR, EST AFRICAN AMERICAN: 79 mL/min/{1.73_m2} (ref >=60)
GFR, EST NON AFRICAN AMERICAN: 68 mL/min/{1.73_m2} (ref >=60)
GLOBULIN: 2.5 g/dL (ref 1.9–3.7)
Glucose, Bld: 95 mg/dL (ref 65–99)
Potassium: 4.2 mmol/L (ref 3.5–5.3)
SODIUM: 139 mmol/L (ref 135–146)
TOTAL PROTEIN: 6.3 g/dL (ref 6.1–8.1)
Total Bilirubin: 0.2 mg/dL (ref 0.2–1.2)

## 2018-01-23 LAB — VITAMIN D 25 HYDROXY (VIT D DEFICIENCY, FRACTURES): Vit D, 25-Hydroxy: 38 ng/mL (ref 30–100)

## 2018-01-23 NOTE — Progress Notes (Signed)
Vit D is normal. She should take Vit D 2000 U daily and repeat test in 6 months.

## 2018-01-23 NOTE — Telephone Encounter (Signed)
Spoke with patient at office visit regarding her reclast BIV *see previous telephone note for description* She is afraid that she will have to pay her entire deductible before her insurance will pay for the reclast infusion. She wants to try different therapy.   After speaking with Dr. Estanislado Pandy, patient can try Prolia. Patient would like to know her co-pay for Prolia. A prior authorization has been submitted to pts insurance via cover my meds. Will update once we have a response.   Olanda Boughner, [place], CPhT 8:37 AM

## 2018-01-27 NOTE — Telephone Encounter (Addendum)
Received a fax from [facility] regarding a prior authorization [place] for [place] under pts pard D (pharmacy benefit) but was covered under Part B (medical benefit.   Called [employer] to get get clarification. Spoke with Oneal Grout who states that Prolia is covered under pts PartB benefit. No pre-cert is required for Prolia [phone]). She is responsible for 20% co-insurance. She has a MaxOP of $3400. After MaxOP is paid, services will be covered at 100%. She has paid $70 towards her MaxOP.   Reference number: [id number] Phone number:[phone]  Called pt to update. Left voice message.  Will send document to scan center.  Tiny Chaudhary, [place], CPhT 3:24 PM

## 2018-01-28 NOTE — Telephone Encounter (Signed)
Patient returned call. She will think about getting the Prolia at the infusion center. If she chooses to get it done there, we will send the order. We will provider her with the number to call and schedule an appointment at [place] at [facility]. Patient voices understanding and denies any questions at this time. Patient voices understanding and denies any questions at this time.   Alfred Eckley, [place], CPhT 10:09 AM

## 2018-02-03 ENCOUNTER — Other Ambulatory Visit: Payer: Self-pay | Admitting: *Deleted

## 2018-02-03 DIAGNOSIS — M810 Age-related osteoporosis without current pathological fracture: Secondary | ICD-10-CM

## 2018-02-03 NOTE — Telephone Encounter (Signed)
Orders placed for Prolia inejction. Contacted patient and provided her with Short Stay information for her to schedule appointment. Reminded patient to come to office 10 day after injection.

## 2018-02-03 NOTE — Telephone Encounter (Signed)
Patient returned call. She would like to proceed with getting Prolia at [facility]. We will put the order in the system and contact her with information to schedule the appointment. Patient voices understanding and denies any questions at this time.   Please submit an order for Prolia infusion/injection for pt at [place] and contact her with information to schedule her appointment. Thanks!  Emersen Carroll, [place], CPhT 2:32 PM

## 2018-02-07 ENCOUNTER — Ambulatory Visit (HOSPITAL_COMMUNITY)
Admission: RE | Admit: 2018-02-07 | Discharge: 2018-02-07 | Disposition: A | Discharge status: Home / Self Care | Payer: Medicare HMO | Source: Ambulatory Visit | Attending: Rheumatology | Admitting: Rheumatology

## 2018-02-07 DIAGNOSIS — M810 Age-related osteoporosis without current pathological fracture: Secondary | ICD-10-CM

## 2018-02-07 MED ORDER — DENOSUMAB 60 MG/ML ~~LOC~~ SOLN
60.0000 mg | Freq: Once | SUBCUTANEOUS | Status: AC
  Administered 2018-02-07: 60 mg via SUBCUTANEOUS
  Filled 2018-02-07: qty 1

## 2018-02-07 NOTE — Discharge Instructions (Signed)
Denosumab injection °What is this medicine? °DENOSUMAB (den oh sue mab) slows bone breakdown. Prolia is used to treat osteoporosis in women after menopause and in men. Xgeva is used to treat a high calcium level due to cancer and to prevent bone fractures and other bone problems caused by multiple myeloma or cancer bone metastases. Xgeva is also used to treat giant cell tumor of the bone. °This medicine may be used for other purposes; ask your health care provider or pharmacist if you have questions. °COMMON BRAND NAME(S): Prolia, XGEVA °What should I tell my health care provider before I take this medicine? °They need to know if you have any of these conditions: °-dental disease °-having surgery or tooth extraction °-infection °-kidney disease °-low levels of calcium or Vitamin D in the blood °-malnutrition °-on hemodialysis °-skin conditions or sensitivity °-thyroid or parathyroid disease °-an unusual reaction to denosumab, other medicines, foods, dyes, or preservatives °-pregnant or trying to get pregnant °-breast-feeding °How should I use this medicine? °This medicine is for injection under the skin. It is given by a health care professional in a hospital or clinic setting. °If you are getting Prolia, a special MedGuide will be given to you by the pharmacist with each prescription and refill. Be sure to read this information carefully each time. °For Prolia, talk to your pediatrician regarding the use of this medicine in children. Special care may be needed. For Xgeva, talk to your pediatrician regarding the use of this medicine in children. While this drug may be prescribed for children as young as 13 years for selected conditions, precautions do apply. °Overdosage: If you think you have taken too much of this medicine contact a poison control center or emergency room at once. °NOTE: This medicine is only for you. Do not share this medicine with others. °What if I miss a dose? °It is important not to miss your  dose. Call your doctor or health care professional if you are unable to keep an appointment. °What may interact with this medicine? °Do not take this medicine with any of the following medications: °-other medicines containing denosumab °This medicine may also interact with the following medications: °-medicines that lower your chance of fighting infection °-steroid medicines like prednisone or cortisone °This list may not describe all possible interactions. Give your health care provider a list of all the medicines, herbs, non-prescription drugs, or dietary supplements you use. Also tell them if you smoke, drink alcohol, or use illegal drugs. Some items may interact with your medicine. °What should I watch for while using this medicine? °Visit your doctor or health care professional for regular checks on your progress. Your doctor or health care professional may order blood tests and other tests to see how you are doing. °Call your doctor or health care professional for advice if you get a fever, chills or sore throat, or other symptoms of a cold or flu. Do not treat yourself. This drug may decrease your body's ability to fight infection. Try to avoid being around people who are sick. °You should make sure you get enough calcium and vitamin D while you are taking this medicine, unless your doctor tells you not to. Discuss the foods you eat and the vitamins you take with your health care professional. °See your dentist regularly. Brush and floss your teeth as directed. Before you have any dental work done, tell your dentist you are receiving this medicine. °Do not become pregnant while taking this medicine or for 5 months after stopping   it. Talk with your doctor or health care professional about your birth control options while taking this medicine. Women should inform their doctor if they wish to become pregnant or think they might be pregnant. There is a potential for serious side effects to an unborn child. Talk  to your health care professional or pharmacist for more information. What side effects may I notice from receiving this medicine? Side effects that you should report to your doctor or health care professional as soon as possible: -allergic reactions like skin rash, itching or hives, swelling of the face, lips, or tongue -bone pain -breathing problems -dizziness -jaw pain, especially after dental work -redness, blistering, peeling of the skin -signs and symptoms of infection like fever or chills; cough; sore throat; pain or trouble passing urine -signs of low calcium like fast heartbeat, muscle cramps or muscle pain; pain, tingling, numbness in the hands or feet; seizures -unusual bleeding or bruising -unusually weak or tired Side effects that usually do not require medical attention (report to your doctor or health care professional if they continue or are bothersome): -constipation -diarrhea -headache -joint pain -loss of appetite -muscle pain -runny nose -tiredness -upset stomach This list may not describe all possible side effects. Call your doctor for medical advice about side effects. You may report side effects to FDA at 1-800-FDA-1088. Where should I keep my medicine? This medicine is only given in a clinic, doctor's office, or other health care setting and will not be stored at home. NOTE: This sheet is a summary. It may not cover all possible information. If you have questions about this medicine, talk to your doctor, pharmacist, or health care provider.  2018 Elsevier/Gold Standard (2016-11-13 19:17:21)

## 2018-02-10 ENCOUNTER — Telehealth: Payer: Self-pay | Admitting: Rheumatology

## 2018-02-10 ENCOUNTER — Other Ambulatory Visit: Payer: Self-pay | Admitting: Internal Medicine

## 2018-02-10 DIAGNOSIS — Z1231 Encounter for screening mammogram for malignant neoplasm of breast: Secondary | ICD-10-CM

## 2018-02-10 NOTE — Telephone Encounter (Signed)
Patient had a prolia injection on Friday and has been advised to stop taking Fosamax, per Lovena Le. Patient states she did not take it this week and will discontinue the med.

## 2018-02-10 NOTE — Telephone Encounter (Signed)
Patient had infusion Friday. Patient does not remember being told to stop/ or not to stop Fosomax. Patient did not take it Sunday when she was scheduled to. Please call to advise.

## 2018-02-18 ENCOUNTER — Other Ambulatory Visit: Payer: Self-pay | Admitting: *Deleted

## 2018-02-18 ENCOUNTER — Telehealth: Payer: Self-pay

## 2018-02-18 DIAGNOSIS — Z79899 Other long term (current) drug therapy: Secondary | ICD-10-CM

## 2018-02-18 LAB — COMPLETE METABOLIC PANEL WITH GFR
AG Ratio: 1.6 (calc) (ref 1.0–2.5)
ALT: 9 U/L (ref 6–29)
AST: 14 U/L (ref 10–35)
Albumin: 3.7 g/dL (ref 3.6–5.1)
Alkaline phosphatase (APISO): 54 U/L (ref 33–130)
BUN: 21 mg/dL (ref 7–25)
CALCIUM: 8.7 mg/dL (ref 8.6–10.4)
CO2: 25 mmol/L (ref 20–32)
CREATININE: 0.79 mg/dL (ref 0.60–0.93)
Chloride: 109 mmol/L (ref 98–110)
GFR, EST NON AFRICAN AMERICAN: 75 mL/min/{1.73_m2} (ref >=60)
GFR, Est African American: 87 mL/min/{1.73_m2} (ref >=60)
GLOBULIN: 2.3 g/dL (ref 1.9–3.7)
GLUCOSE: 103 mg/dL — AB (ref 65–99)
Potassium: 4.1 mmol/L (ref 3.5–5.3)
SODIUM: 140 mmol/L (ref 135–146)
Total Bilirubin: 0.3 mg/dL (ref 0.2–1.2)
Total Protein: 6 g/dL — ABNORMAL LOW (ref 6.1–8.1)

## 2018-02-18 NOTE — Telephone Encounter (Addendum)
Spoke with pt today after getting labs drawn. She states that she got her prolia injection at [facility]. She states that she spoke with a representative from her insurance about her polia and they told her that she could have the phramcy fill Rx, ship it to the clinic and have it administered here. I explained to her that we considered that first and we recieved a denied claim with her part D plan. Her part B plan covers prolia which is why we set her up at the infusion center. She is not schedule to have another injection until October 2019. We will look into getting her Rx at the pharmacy next time and update her. Patient voices understanding and denies any questions at this time.   Zeanna Sunde, [place], CPhT 2:31 PM

## 2018-02-26 ENCOUNTER — Ambulatory Visit: Payer: Medicare HMO | Admitting: Rheumatology

## 2018-02-28 ENCOUNTER — Other Ambulatory Visit: Payer: Self-pay | Admitting: Rheumatology

## 2018-02-28 ENCOUNTER — Other Ambulatory Visit: Payer: Self-pay | Admitting: Physician Assistant

## 2018-02-28 NOTE — Telephone Encounter (Signed)
Last Visit: 12/25/17 Next Visit: 06/26/18  Okay to refill Xanax?

## 2018-02-28 NOTE — Telephone Encounter (Signed)
Last Visit: 12/25/17 Next visit: 06/26/18  Okay to refill per Dr. Estanislado Pandy

## 2018-03-05 ENCOUNTER — Ambulatory Visit: Payer: Medicare HMO

## 2018-03-10 ENCOUNTER — Ambulatory Visit
Admission: RE | Admit: 2018-03-10 | Discharge: 2018-03-10 | Disposition: A | Discharge status: Home / Self Care | Payer: Medicare HMO | Source: Ambulatory Visit | Attending: Internal Medicine | Admitting: Internal Medicine

## 2018-03-10 DIAGNOSIS — Z1231 Encounter for screening mammogram for malignant neoplasm of breast: Secondary | ICD-10-CM

## 2018-04-19 ENCOUNTER — Other Ambulatory Visit: Payer: Self-pay | Admitting: Physician Assistant

## 2018-04-19 DIAGNOSIS — Z5181 Encounter for therapeutic drug level monitoring: Secondary | ICD-10-CM

## 2018-04-21 ENCOUNTER — Other Ambulatory Visit: Payer: Self-pay

## 2018-04-21 DIAGNOSIS — Z5181 Encounter for therapeutic drug level monitoring: Secondary | ICD-10-CM

## 2018-04-21 NOTE — Telephone Encounter (Signed)
ok 

## 2018-04-21 NOTE — Telephone Encounter (Addendum)
Last Visit: 12/25/17 Next visit: 06/26/18 UDS: 04/02/2017 Narc agreement: 04/02/2017   Patient is updating her UDS and anrc agreement today.  Okay to refill Tramadol?

## 2018-04-23 LAB — PAIN MGMT, PROFILE 5 W/CONF, U
AMPHETAMINES: NEGATIVE ng/mL (ref <500)
BENZODIAZEPINES: NEGATIVE ng/mL (ref <100)
Barbiturates: NEGATIVE ng/mL (ref <300)
COCAINE METABOLITE: NEGATIVE ng/mL (ref <150)
CREATININE: 137.4 mg/dL
MARIJUANA METABOLITE: NEGATIVE ng/mL (ref <20)
Methadone Metabolite: NEGATIVE ng/mL (ref <100)
OXIDANT: NEGATIVE ug/mL (ref <200)
Opiates: NEGATIVE ng/mL (ref <100)
Oxycodone: NEGATIVE ng/mL (ref <100)
PH: 6.7 (ref 4.5–9.0)

## 2018-04-23 LAB — PAIN MGMT, TRAMADOL W/MEDMATCH, U
Desmethyltramadol: >10000 ng/mL — ABNORMAL HIGH (ref <100)
Tramadol: >10000 ng/mL — ABNORMAL HIGH (ref <100)

## 2018-06-24 NOTE — Progress Notes (Signed)
Office Visit Note  Patient: Paula Murphy             Date of Birth: [birth year]           MRN: [id number]             PCP: Arlyss Repress, MD Referring: Arlyss Repress, MD Visit Date: 07/01/2018 Occupation: @GUAROCC @  Subjective:  Generalized pain   History of Present Illness: Paula Murphy is a 72 y.o. female  With history of osteoporosis, fibromyalgia, and osteoarthritis.  Patient reports that her fibromyalgia has been flaring more frequently.  She continues to take Topamax 150 mg at bedtime.  She takes tramadol 1 to 2 tablets twice daily as needed for pain relief as well as ibuprofen 800 mg 1 tablet twice daily as needed.  She uses Voltaren gel as needed.  She takes Zanaflex 4 mg milligrams at bedtime as needed for muscle spasms.  She takes Xanax 0.5 mg at bedtime PRN. She states that she has severe muscle tenderness and spasms of the trapezius muscles bilaterally.  She states that occasionally she will experience the sensation of things crawling on her skin and cannot sleep at night.  She takes Benadryl at times which provides some relief.  She states that she does not like exercising and does not like water aerobics.  She continues of trochanter bursitis bilaterally.  She is also having pain radiating down bilateral IT bands.  She attended physical therapy in the past which provided relief but her insurance no longer covers it.  She reports that she has chronic pain in her left knee joint but denies any joint swelling.  Her right knee replacement is doing well.  She denies any other joint pain or joint swelling at this time.  She denies any joint stiffness.  She occasionally has right CMC joint pain.  She feels as though her fatigue has been worsening recently. She had her first Prolia injection performed in April 2019.  She continues take vitamin D supplement daily.  She does not take a calcium supplement.   Activities of Daily Living:  Patient reports morning stiffness for 0  minutes.    Patient Reports nocturnal pain.  Difficulty dressing/grooming: Denies Difficulty climbing stairs: Reports Difficulty getting out of chair: Denies Difficulty using hands for taps, buttons, cutlery, and/or writing: Denies  Review of Systems  Constitutional: Negative for fatigue.  HENT: Negative for mouth sores, mouth dryness and nose dryness.   Eyes: Negative for pain, visual disturbance and dryness.  Respiratory: Negative for cough, hemoptysis, shortness of breath and difficulty breathing.   Cardiovascular: Negative for chest pain, palpitations, hypertension and swelling in legs/feet.  Gastrointestinal: Negative for blood in stool, constipation and diarrhea.  Endocrine: Negative for increased urination.  Genitourinary: Negative for painful urination.  Musculoskeletal: Positive for myalgias, muscle tenderness and myalgias. Negative for arthralgias, joint pain, joint swelling, muscle weakness and morning stiffness.  Skin: Negative for color change, pallor, rash, hair loss, nodules/bumps, skin tightness, ulcers and sensitivity to sunlight.  Allergic/Immunologic: Negative for susceptible to infections.  Neurological: Negative for dizziness, numbness, headaches and weakness.  Hematological: Negative for swollen glands.  Psychiatric/Behavioral: Positive for sleep disturbance. Negative for depressed mood. The patient is not nervous/anxious.     PMFS History:  Patient Active Problem List   Diagnosis Date Noted  . Primary osteoarthritis of both feet 03/21/2017  . History of macular degeneration 03/21/2017  . Other fatigue 03/21/2017  . Age-related osteoporosis without current pathological fracture 03/21/2017  .  OA (osteoarthritis) of knee 03/02/2013  . DEPRESSION 07/24/2010  . UTI 07/04/2010  . ABDOMINAL BLOATING 01/20/2010  . Fibromyalgia 10/04/2009  . INSOMNIA, CHRONIC 10/04/2009  . Vitamin D deficiency 10/26/2008  . HYPOTHYROIDISM 08/13/2008  . HYPERLIPIDEMIA 08/13/2008  .  HYPERTENSION 08/13/2008  . HEADACHE 08/13/2008    Past Medical History:  Diagnosis Date  . Arthritis    OA AND PAIN RT KNEE  . Autoimmune disease ([place])    + ANA, and DS DNA--PT STATES SHE WAS TOLD SHE DOES NOT HAVE LUPUS AS PREVIOUSLY THOUGHT  . Fibromyalgia    followed by Dr. Estanislado Pandy  . Hemorrhoids   . Hyperlipidemia   . Hypertension    PAST HX OF HYPERTENSION - TOOK MEDICATION--BUT OFF MEDICATION FOR YEARS-NO LONGER A PROBLEM  . Hypothyroidism   . Macular degeneration    BOTH EYES  . PONV (postoperative nausea and vomiting)   . Thyroid disease    Hypothyroidism    Family History  Problem Relation Age of Onset  . Stroke Mother   . Hypertension Mother   . Hyperlipidemia Mother   . Cancer Mother        Lung  . Alzheimer's disease Mother   . Stroke Father   . Hypertension Father   . Diabetes Father        Type II  . Hyperlipidemia Father   . Cancer Father        Lung  . Arthritis Father        Rheumatoid  . Thyroid disease Daughter    Past Surgical History:  Procedure Laterality Date  . ABDOMINAL HYSTERECTOMY  1989  . BUNIONECTOMY Left 2012  . CHOLECYSTECTOMY  1996  . EYE SURGERY     BILATERAL CATARACT EXTRACTION  . KNEE ARTHROSCOPY  1994   Right Knee  . KNEE SURGERY    . NECK SURGERY  2003  . TONSILLECTOMY  1953  . TOTAL KNEE ARTHROPLASTY Right 03/02/2013   Procedure: RIGHT TOTAL KNEE ARTHROPLASTY;  Surgeon: Gearlean Alf, MD;  Location: [facility];  Service: Orthopedics;  Laterality: Right;   Social History   Social History Narrative   Retired   Married   1 daughter/1 grandson   Never Smoked   Alcohol use - yes (social)    Objective: Vital Signs: BP 127/78 (BP Location: Left Arm, Patient Position: Sitting, Cuff Size: Normal)   Pulse 72   Resp 14   Ht 5\' 1"  (1.549 m)   Wt 164 lb (74.4 kg)   BMI 30.99 kg/m    Physical Exam  Constitutional: She is oriented to person, place, and time. She appears well-developed and well-nourished.  HENT:  Head:  Normocephalic and atraumatic.  Eyes: Conjunctivae and EOM are normal.  Neck: Normal range of motion.  Cardiovascular: Normal rate, regular rhythm, normal heart sounds and intact distal pulses.  Pulmonary/Chest: Effort normal and breath sounds normal.  Abdominal: Soft. Bowel sounds are normal.  Lymphadenopathy:    She has no cervical adenopathy.  Neurological: She is alert and oriented to person, place, and time.  Skin: Skin is warm and dry. Capillary refill takes less than 2 seconds.  Psychiatric: She has a normal mood and affect. Her behavior is normal.  Nursing note and vitals reviewed.    Musculoskeletal Exam: Generalized hyperalgesia on exam.  C-spine limited ROM.  Thoracic kyphosis.  Limited ROM of lumbar spine.  Shoulder joints, elbow joints, wrist joints, MCPs, PIPs, DIPs good range of motion with no synovitis.  She has tenderness  of the right CMC joint.  Hip joints, knee joints good range of motion.  Right total knee replacement slightly limited extension.  No warmth or effusion noted.  She has osteoarthritic changes of bilateral feet.  She has positive tender points for fibromyalgia.  CDAI Exam: CDAI Score: Not documented Patient Global Assessment: Not documented; Provider Global Assessment: Not documented Swollen: Not documented; Tender: Not documented Joint Exam   Not documented   There is currently no information documented on the homunculus. Go to the Rheumatology activity and complete the homunculus joint exam.  Investigation: No additional findings.  Imaging: No results found.  Recent Labs: Lab Results  Component Value Date   WBC 5.2 01/22/2018   HGB 12.1 01/22/2018   PLT 241 01/22/2018   NA 140 02/18/2018   K 4.1 02/18/2018   CL 109 02/18/2018   CO2 25 02/18/2018   GLUCOSE 103 (H) 02/18/2018   BUN 21 02/18/2018   CREATININE 0.79 02/18/2018   BILITOT 0.3 02/18/2018   ALKPHOS 77 02/24/2013   AST 14 02/18/2018   ALT 9 02/18/2018   PROT 6.0 (L) 02/18/2018     ALBUMIN 3.5 02/24/2013   CALCIUM 8.7 02/18/2018   GFRAA 87 02/18/2018    Speciality Comments: No specialty comments available.  Procedures:  No procedures performed Allergies: Codeine and Hydrocodone   Assessment / Plan:     Visit Diagnoses: Age-related osteoporosis without current pathological fracture - DEXA 10/18/17: T-score -2.9, Prolia started in April 2019.  She will be due for her next Prolia injection in October 2019.  She is aware that she needs lab work 10 days after her next injection.  Vitamin D deficiency:She takes vitamin D supplement on a daily basis.  Primary osteoarthritis of left knee: Chronic pain.  No warmth or effusion.  She is good range of motion.  Status post total right knee replacement: Doing well.  She is slightly limited extension of her right knee.  No warmth or effusion noted.  She has no discomfort at this time.  Primary osteoarthritis of both feet: She has osteoarthritic changes in bilateral feet.  She has no discomfort in her feet at this time.  She wears proper fitting shoes.  Trochanteric bursitis of both hips: She has bilateral trochanteric bursitis.  She attended physical therapy in the past but her insurance stopped covering and she can no longer go.  She would like to try to go to physical therapy again soon.  She is in a follow negative therapy and needed energy to see if they accept her insurance.  Fibromyalgia: Her fibromyalgia has been flaring more frequently.  She has tender points for fibromyalgia on exam today.  She has generalized hyperalgesia.  She has been having increased generalized muscle aches as well as fatigue.  She continues to have insomnia.  She takes Topamax 150 mg at bedtime, Xanax 0.5 mg at bedtime as needed, and Zanaflex 4 mg at bedtime for muscle spasms.  She has been taking tramadol 50 mg 1 to 2 tablets twice daily and ibuprofen 800 mg 1 tablet twice daily as needed for pain relief.  She was encouraged to exercise as well as  try water aerobics.  She has not been exercising due to her level of fatigue and does not want to start water aerobics.  She does not need any refills of her medications.  She was advised to not increase her dose of tramadol at this time due to the other medications that she is on currently.  Other fatigue: Chronic and worsening.  Primary insomnia: Chronic.  She takes Topamax 150 mg at bedtime and Zanaflex 4 mg at bedtime as needed.  Good sleep hygiene was discussed.  History of anxiety: She takes Xanax 0.5 mg at bedtime as needed.  Other medical conditions are listed as follows:  History of macular degeneration  History of hypothyroidism  History of hyperlipidemia  History of hypertension  History of depression    Orders: No orders of the defined types were placed in this encounter.  No orders of the defined types were placed in this encounter.   Face-to-face time spent with patient was 30 minutes. Greater than 50% of time was spent in counseling and coordination of care.  Follow-Up Instructions: Return in about 6 months (around 01/01/2019) for Osteoporosis, Fibromyalgia, Osteoarthritis.   Ofilia Neas, PA-C  Note - This record has been created using Dragon software.  Chart creation errors have been sought, but may not always  have been located. Such creation errors do not reflect on  the standard of medical care.

## 2018-06-26 ENCOUNTER — Ambulatory Visit: Payer: Medicare HMO | Admitting: Rheumatology

## 2018-07-01 ENCOUNTER — Ambulatory Visit: Payer: Medicare HMO | Admitting: Physician Assistant

## 2018-07-01 ENCOUNTER — Encounter: Payer: Self-pay | Admitting: Physician Assistant

## 2018-07-01 ENCOUNTER — Encounter (INDEPENDENT_AMBULATORY_CARE_PROVIDER_SITE_OTHER): Payer: Self-pay

## 2018-07-01 VITALS — BP 127/78 | HR 72 | Resp 14 | Ht 61.0 in | Wt 164.0 lb

## 2018-07-01 DIAGNOSIS — M1712 Unilateral primary osteoarthritis, left knee: Secondary | ICD-10-CM

## 2018-07-01 DIAGNOSIS — Z96651 Presence of right artificial knee joint: Secondary | ICD-10-CM

## 2018-07-01 DIAGNOSIS — M19071 Primary osteoarthritis, right ankle and foot: Secondary | ICD-10-CM

## 2018-07-01 DIAGNOSIS — Z8659 Personal history of other mental and behavioral disorders: Secondary | ICD-10-CM

## 2018-07-01 DIAGNOSIS — M7062 Trochanteric bursitis, left hip: Secondary | ICD-10-CM

## 2018-07-01 DIAGNOSIS — M810 Age-related osteoporosis without current pathological fracture: Secondary | ICD-10-CM | POA: Diagnosis not present

## 2018-07-01 DIAGNOSIS — Z87898 Personal history of other specified conditions: Secondary | ICD-10-CM

## 2018-07-01 DIAGNOSIS — M797 Fibromyalgia: Secondary | ICD-10-CM

## 2018-07-01 DIAGNOSIS — M7061 Trochanteric bursitis, right hip: Secondary | ICD-10-CM

## 2018-07-01 DIAGNOSIS — Z8669 Personal history of other diseases of the nervous system and sense organs: Secondary | ICD-10-CM

## 2018-07-01 DIAGNOSIS — F5101 Primary insomnia: Secondary | ICD-10-CM

## 2018-07-01 DIAGNOSIS — M19072 Primary osteoarthritis, left ankle and foot: Secondary | ICD-10-CM

## 2018-07-01 DIAGNOSIS — Z8639 Personal history of other endocrine, nutritional and metabolic disease: Secondary | ICD-10-CM

## 2018-07-01 DIAGNOSIS — E559 Vitamin D deficiency, unspecified: Secondary | ICD-10-CM | POA: Diagnosis not present

## 2018-07-01 DIAGNOSIS — Z8679 Personal history of other diseases of the circulatory system: Secondary | ICD-10-CM

## 2018-07-01 DIAGNOSIS — R5383 Other fatigue: Secondary | ICD-10-CM

## 2018-07-01 NOTE — Patient Instructions (Signed)
Integrative therapy  [employer]

## 2018-08-05 ENCOUNTER — Telehealth: Payer: Self-pay | Admitting: Rheumatology

## 2018-08-05 DIAGNOSIS — Z79899 Other long term (current) drug therapy: Secondary | ICD-10-CM

## 2018-08-05 NOTE — Telephone Encounter (Signed)
Patient advised that she will need to come to the office for labs before the orders can be placed for the Prolia injection. Patient receives her Prol[facility] injection at [facility] stay at [facility]. Patient will come tomorrow for her labs and then orders will be placed for the Prolia injection. Patient will then call to schedule her appointment for 10 days after labs have been drawn. Patient verbalized understanding.

## 2018-08-05 NOTE — Telephone Encounter (Signed)
Patient calling in rerence to her Prolia injection? Patient states you were going to schedule that for her. Please call to advise.

## 2018-08-06 ENCOUNTER — Other Ambulatory Visit: Payer: Self-pay

## 2018-08-06 DIAGNOSIS — Z79899 Other long term (current) drug therapy: Secondary | ICD-10-CM

## 2018-08-06 LAB — COMPLETE METABOLIC PANEL WITH GFR
AG Ratio: 1.6 (calc) (ref 1.0–2.5)
ALBUMIN MSPROF: 3.7 g/dL (ref 3.6–5.1)
ALT: 10 U/L (ref 6–29)
AST: 13 U/L (ref 10–35)
Alkaline phosphatase (APISO): 55 U/L (ref 33–130)
BUN: 22 mg/dL (ref 7–25)
CALCIUM: 8.7 mg/dL (ref 8.6–10.4)
CHLORIDE: 110 mmol/L (ref 98–110)
CO2: 26 mmol/L (ref 20–32)
CREATININE: 0.83 mg/dL (ref 0.60–0.93)
GFR, EST NON AFRICAN AMERICAN: 71 mL/min/{1.73_m2} (ref >=60)
GFR, Est African American: 82 mL/min/{1.73_m2} (ref >=60)
GLOBULIN: 2.3 g/dL (ref 1.9–3.7)
Glucose, Bld: 89 mg/dL (ref 65–99)
POTASSIUM: 4.3 mmol/L (ref 3.5–5.3)
Sodium: 142 mmol/L (ref 135–146)
Total Bilirubin: 0.3 mg/dL (ref 0.2–1.2)
Total Protein: 6 g/dL — ABNORMAL LOW (ref 6.1–8.1)

## 2018-08-06 LAB — CBC WITH DIFFERENTIAL/PLATELET
BASOS ABS: 40 {cells}/uL (ref 0–200)
BASOS PCT: 0.9 %
EOS PCT: 3.2 %
Eosinophils Absolute: 141 cells/uL (ref 15–500)
HEMATOCRIT: 36.6 % (ref 35.0–45.0)
HEMOGLOBIN: 12.2 g/dL (ref 11.7–15.5)
LYMPHS ABS: 1976 {cells}/uL (ref 850–3900)
MCH: 31.4 pg (ref 27.0–33.0)
MCHC: 33.3 g/dL (ref 32.0–36.0)
MCV: 94.1 fL (ref 80.0–100.0)
MPV: 10.8 fL (ref 7.5–12.5)
Monocytes Relative: 10 %
NEUTROS ABS: 1804 {cells}/uL (ref 1500–7800)
Neutrophils Relative %: 41 %
Platelets: 226 10*3/uL (ref 140–400)
RBC: 3.89 10*6/uL (ref 3.80–5.10)
RDW: 12.3 % (ref 11.0–15.0)
Total Lymphocyte: 44.9 %
WBC mixed population: 440 cells/uL (ref 200–950)
WBC: 4.4 10*3/uL (ref 3.8–10.8)

## 2018-08-07 ENCOUNTER — Other Ambulatory Visit: Payer: Self-pay | Admitting: *Deleted

## 2018-08-07 DIAGNOSIS — M810 Age-related osteoporosis without current pathological fracture: Secondary | ICD-10-CM

## 2018-08-07 NOTE — Telephone Encounter (Signed)
Last Visit: 07/01/18 Next Visit: 01/01/19 UDS: 04/21/18 Narc Agreement: 12/25/17  Okay to refill Tramadol?

## 2018-08-07 NOTE — Telephone Encounter (Signed)
ok 

## 2018-08-08 MED ORDER — TRAMADOL HCL 50 MG PO TABS: ORAL_TABLET | ORAL | 2 refills | Status: DC

## 2018-08-16 ENCOUNTER — Other Ambulatory Visit: Payer: Self-pay | Admitting: Rheumatology

## 2018-08-18 NOTE — Telephone Encounter (Signed)
Last Visit: 07/01/18 Next Visit: 01/01/19 Labs: 08/06/18 WNL  Okay to refill per Dr. Estanislado Pandy

## 2018-08-19 ENCOUNTER — Ambulatory Visit (HOSPITAL_COMMUNITY)
Admission: RE | Admit: 2018-08-19 | Discharge: 2018-08-19 | Disposition: A | Discharge status: Home / Self Care | Payer: Medicare HMO | Source: Ambulatory Visit | Attending: Rheumatology | Admitting: Rheumatology

## 2018-08-19 DIAGNOSIS — M810 Age-related osteoporosis without current pathological fracture: Secondary | ICD-10-CM

## 2018-08-19 MED ORDER — DENOSUMAB 60 MG/ML ~~LOC~~ SOSY
PREFILLED_SYRINGE | SUBCUTANEOUS | Status: AC
  Filled 2018-08-19: qty 1

## 2018-08-19 MED ORDER — DENOSUMAB 60 MG/ML ~~LOC~~ SOSY
60.0000 mg | PREFILLED_SYRINGE | Freq: Once | SUBCUTANEOUS | Status: AC
  Administered 2018-08-19: 60 mg via SUBCUTANEOUS

## 2018-09-01 ENCOUNTER — Other Ambulatory Visit: Payer: Self-pay | Admitting: *Deleted

## 2018-09-01 DIAGNOSIS — Z79899 Other long term (current) drug therapy: Secondary | ICD-10-CM

## 2018-09-02 LAB — COMPLETE METABOLIC PANEL WITH GFR
AG Ratio: 1.5 (calc) (ref 1.0–2.5)
ALT: 10 U/L (ref 6–29)
AST: 14 U/L (ref 10–35)
Albumin: 3.8 g/dL (ref 3.6–5.1)
Alkaline phosphatase (APISO): 56 U/L (ref 33–130)
BUN: 18 mg/dL (ref 7–25)
CALCIUM: 8.8 mg/dL (ref 8.6–10.4)
CO2: 25 mmol/L (ref 20–32)
CREATININE: 0.91 mg/dL (ref 0.60–0.93)
Chloride: 109 mmol/L (ref 98–110)
GFR, EST NON AFRICAN AMERICAN: 63 mL/min/{1.73_m2} (ref >=60)
GFR, Est African American: 74 mL/min/{1.73_m2} (ref >=60)
GLUCOSE: 96 mg/dL (ref 65–99)
Globulin: 2.5 g/dL (calc) (ref 1.9–3.7)
Potassium: 4.4 mmol/L (ref 3.5–5.3)
Sodium: 140 mmol/L (ref 135–146)
Total Bilirubin: 0.3 mg/dL (ref 0.2–1.2)
Total Protein: 6.3 g/dL (ref 6.1–8.1)

## 2018-09-15 ENCOUNTER — Other Ambulatory Visit: Payer: Self-pay | Admitting: Physician Assistant

## 2018-09-15 ENCOUNTER — Other Ambulatory Visit: Payer: Self-pay | Admitting: Rheumatology

## 2018-09-15 NOTE — Telephone Encounter (Signed)
Last Visit: 07/01/18 Next Visit: 01/01/19  Okay to refill Xanax?

## 2018-09-15 NOTE — Telephone Encounter (Signed)
Last Visit: 07/01/18 Next Visit: 01/01/19  Okay to refill per Dr. Estanislado Pandy

## 2018-11-10 ENCOUNTER — Telehealth: Payer: Self-pay | Admitting: Rheumatology

## 2018-11-10 NOTE — Telephone Encounter (Signed)
Paula Murphy from [facility] left a voicemial stating patient's pharmacy is switching to [facility].  ID #[id number] Group #[id number]

## 2018-11-10 NOTE — Telephone Encounter (Signed)
Noted in patient's chart.

## 2018-12-12 ENCOUNTER — Other Ambulatory Visit: Payer: Self-pay | Admitting: *Deleted

## 2018-12-12 DIAGNOSIS — G8929 Other chronic pain: Secondary | ICD-10-CM

## 2018-12-12 DIAGNOSIS — Z5181 Encounter for therapeutic drug level monitoring: Secondary | ICD-10-CM

## 2018-12-12 MED ORDER — TRAMADOL HCL 50 MG PO TABS: ORAL_TABLET | ORAL | 2 refills | Status: DC

## 2018-12-12 MED ORDER — TOPIRAMATE 50 MG PO TABS: 150.0000 mg | ORAL_TABLET | Freq: Every day | ORAL | 0 refills | Status: DC

## 2018-12-12 MED ORDER — IBUPROFEN 800 MG PO TABS: ORAL_TABLET | ORAL | 1 refills | Status: DC

## 2018-12-12 NOTE — Telephone Encounter (Signed)
ok 

## 2018-12-12 NOTE — Telephone Encounter (Signed)
Refill request received via fax  Last Visit: 07/01/18 Next Visit: 01/01/19 Labs: 08/06/18 WNL UDS: 04/21/18 Narc Agreement: 12/25/17  Patient advised she id due to update UDS and will update on 12/16/18.   Okay to refill Tramadol, Topamax and Ibuprofen?

## 2018-12-16 ENCOUNTER — Other Ambulatory Visit: Payer: Self-pay | Admitting: *Deleted

## 2018-12-16 DIAGNOSIS — G8929 Other chronic pain: Secondary | ICD-10-CM

## 2018-12-16 DIAGNOSIS — Z5181 Encounter for therapeutic drug level monitoring: Secondary | ICD-10-CM

## 2018-12-18 LAB — PAIN MGMT, TRAMADOL W/MEDMATCH, U
Desmethyltramadol: >10000 ng/mL — ABNORMAL HIGH (ref <100)
Tramadol: >10000 ng/mL — ABNORMAL HIGH (ref <100)

## 2018-12-18 LAB — PAIN MGMT, PROFILE 5 W/CONF, U
Amphetamines: NEGATIVE ng/mL (ref <500)
Barbiturates: NEGATIVE ng/mL (ref <300)
Benzodiazepines: NEGATIVE ng/mL (ref <100)
Cocaine Metabolite: NEGATIVE ng/mL (ref <150)
Creatinine: 212.3 mg/dL
Marijuana Metabolite: NEGATIVE ng/mL (ref <20)
Methadone Metabolite: NEGATIVE ng/mL (ref <100)
OXYCODONE: NEGATIVE ng/mL (ref <100)
Opiates: NEGATIVE ng/mL (ref <100)
Oxidant: NEGATIVE ug/mL (ref <200)
pH: 6.7 (ref 4.5–9.0)

## 2018-12-18 NOTE — Progress Notes (Signed)
.   Office Visit Note  Patient: Paula Murphy             Date of Birth: 24-Aug-1946           MRN: [id number]             PCP: Arlyss Repress, MD Referring: Arlyss Repress, MD Visit Date: 01/01/2019 Occupation: @GUAROCC @  Subjective:  Generalized muscle aches    History of Present Illness: Paula Murphy is a 73 y.o. female with history of osteoporosis and fibromyalgia.  She is on Prolia for management of osteoporosis.  She is due for her next Prolia injection in April 2020.  She continues to take a vitamin D supplement on a daily basis.  She cannot take a calcium supplement due to it causing constipation. She reports that her fibromyalgia has been flaring for the past 1 month.  She is having generalized muscle aches muscle tenderness.  She has trapezius muscle tension and spasms bilaterally.  She continues to have bilateral trochanter bursitis.  She perform stretching exercises on a regular basis.  She reports that her level of fatigue has been stable.  She states she has been sleeping well at night.  She states that she uses Voltaren gel on bilateral trochanter bursa which provides pain relief.  She continues to take tramadol as prescribed and Advil for breakthrough pain.  She denies any joint pain or joint swelling at this time.  She reports her right knee replacement is doing well.  She states that she has occasional discomfort in her left knee and will be seeing Dr. Wynelle Link.     Activities of Daily Living:  Patient reports morning stiffness for 5-10 minutes.   Patient Reports nocturnal pain.  Difficulty dressing/grooming: Denies Difficulty climbing stairs: Reports Difficulty getting out of chair: Denies Difficulty using hands for taps, buttons, cutlery, and/or writing: Reports  Review of Systems  Constitutional: Positive for fatigue.  HENT: Negative for mouth sores, mouth dryness and nose dryness.   Eyes: Negative for pain, itching, visual disturbance and dryness.    Respiratory: Negative for cough, hemoptysis, shortness of breath, wheezing and difficulty breathing.   Cardiovascular: Negative for chest pain, palpitations, hypertension and swelling in legs/feet.  Gastrointestinal: Negative for abdominal pain, blood in stool, constipation and diarrhea.  Endocrine: Negative for increased urination.  Genitourinary: Negative for painful urination.  Musculoskeletal: Positive for arthralgias, joint pain and morning stiffness. Negative for joint swelling, myalgias, muscle weakness, muscle tenderness and myalgias.  Skin: Negative for color change, pallor, rash, hair loss, nodules/bumps, skin tightness, ulcers and sensitivity to sunlight.  Allergic/Immunologic: Negative for susceptible to infections.  Neurological: Negative for dizziness, light-headedness, numbness, headaches, memory loss and weakness.  Hematological: Negative for swollen glands.  Psychiatric/Behavioral: Negative for depressed mood, confusion and sleep disturbance. The patient is not nervous/anxious.     PMFS History:  Patient Active Problem List   Diagnosis Date Noted  . Primary osteoarthritis of both feet 03/21/2017  . History of macular degeneration 03/21/2017  . Other fatigue 03/21/2017  . Age-related osteoporosis without current pathological fracture 03/21/2017  . OA (osteoarthritis) of knee 03/02/2013  . DEPRESSION 07/24/2010  . UTI 07/04/2010  . ABDOMINAL BLOATING 01/20/2010  . Fibromyalgia 10/04/2009  . INSOMNIA, CHRONIC 10/04/2009  . Vitamin D deficiency 10/26/2008  . HYPOTHYROIDISM 08/13/2008  . HYPERLIPIDEMIA 08/13/2008  . HYPERTENSION 08/13/2008  . HEADACHE 08/13/2008    Past Medical History:  Diagnosis Date  . Arthritis    OA AND PAIN RT KNEE  .  Autoimmune disease ([place])    + ANA, and DS DNA--PT STATES SHE WAS TOLD SHE DOES NOT HAVE LUPUS AS PREVIOUSLY THOUGHT  . Fibromyalgia    followed by Dr. Estanislado Pandy  . Hemorrhoids   . Hyperlipidemia   . Hypertension    PAST HX  OF HYPERTENSION - TOOK MEDICATION--BUT OFF MEDICATION FOR YEARS-NO LONGER A PROBLEM  . Hypothyroidism   . Macular degeneration    BOTH EYES  . PONV (postoperative nausea and vomiting)   . Thyroid disease    Hypothyroidism    Family History  Problem Relation Age of Onset  . Stroke Mother   . Hypertension Mother   . Hyperlipidemia Mother   . Cancer Mother        Lung  . Alzheimer's disease Mother   . Stroke Father   . Hypertension Father   . Diabetes Father        Type II  . Hyperlipidemia Father   . Cancer Father        Lung  . Arthritis Father        Rheumatoid  . Thyroid disease Daughter    Past Surgical History:  Procedure Laterality Date  . ABDOMINAL HYSTERECTOMY  1989  . BUNIONECTOMY Left 2012  . CHOLECYSTECTOMY  1996  . EYE SURGERY     BILATERAL CATARACT EXTRACTION  . KNEE ARTHROSCOPY  1994   Right Knee  . KNEE SURGERY    . NECK SURGERY  2003  . TONSILLECTOMY  1953  . TOTAL KNEE ARTHROPLASTY Right 03/02/2013   Procedure: RIGHT TOTAL KNEE ARTHROPLASTY;  Surgeon: Gearlean Alf, MD;  Location: [facility];  Service: Orthopedics;  Laterality: Right;   Social History   Social History Narrative   Retired   Married   1 daughter/1 grandson   Never Smoked   Alcohol use - yes (social)   Immunization History  Administered Date(s) Administered  . Influenza Whole 08/12/2008, 08/30/2009, 08/23/2010  . Pneumococcal Polysaccharide-23 03/29/2009  . Td 07/22/2007     Objective: Vital Signs: BP 124/81 (BP Location: Left Arm, Patient Position: Sitting, Cuff Size: Normal)   Pulse 76   Resp 13   Ht 5' 0.5" (1.537 m)   Wt 169 lb (76.7 kg)   BMI 32.46 kg/m    Physical Exam Vitals signs and nursing note reviewed.  Constitutional:      Appearance: She is well-developed.  HENT:     Head: Normocephalic and atraumatic.  Eyes:     Conjunctiva/sclera: Conjunctivae normal.  Neck:     Musculoskeletal: Normal range of motion.  Cardiovascular:     Rate and Rhythm: Normal  rate and regular rhythm.     Heart sounds: Normal heart sounds.  Pulmonary:     Effort: Pulmonary effort is normal.     Breath sounds: Normal breath sounds.  Abdominal:     General: Bowel sounds are normal.     Palpations: Abdomen is soft.  Lymphadenopathy:     Cervical: No cervical adenopathy.  Skin:    General: Skin is warm and dry.     Capillary Refill: Capillary refill takes less than 2 seconds.  Neurological:     Mental Status: She is alert and oriented to person, place, and time.  Psychiatric:        Behavior: Behavior normal.      Musculoskeletal Exam: Generalized hyperalgesia and positive tender points on exam. C-spine, thoracic spine, and lumbar spine good ROM.  Shoulder joints, elbow joints, wrist joints, MCPs, PIPs, and DIPs  good ROM with no synovitis.  Complete fist formation bilaterally.  Hip joints good ROM.  Tenderness over bilateral trochanteric bursa.  Right knee replacement has good ROM with no discomfort.  Left knee slightly limited extension.  No warmth or effusion of knee joints.  No tenderness or swelling of ankle joints.  No achilles tendonitis or plantar fasciitis.   CDAI Exam: CDAI Score: Not documented Patient Global Assessment: Not documented; Provider Global Assessment: Not documented Swollen: Not documented; Tender: Not documented Joint Exam   Not documented   There is currently no information documented on the homunculus. Go to the Rheumatology activity and complete the homunculus joint exam.  Investigation: No additional findings.  Imaging: No results found.  Recent Labs: Lab Results  Component Value Date   WBC 4.4 08/06/2018   HGB 12.2 08/06/2018   PLT 226 08/06/2018   NA 140 09/01/2018   K 4.4 09/01/2018   CL 109 09/01/2018   CO2 25 09/01/2018   GLUCOSE 96 09/01/2018   BUN 18 09/01/2018   CREATININE 0.91 09/01/2018   BILITOT 0.3 09/01/2018   ALKPHOS 77 02/24/2013   AST 14 09/01/2018   ALT 10 09/01/2018   PROT 6.3 09/01/2018    ALBUMIN 3.5 02/24/2013   CALCIUM 8.8 09/01/2018   GFRAA 74 09/01/2018    Speciality Comments: Osteoporosis therapy: Prolia started in April 2019  Procedures:  No procedures performed Allergies: Codeine and Hydrocodone   Assessment / Plan:     Visit Diagnoses: Age-related osteoporosis without current pathological fracture -  She is on Prolia every 6 months (started in April 2019). Her last injection was on 08/19/18. Previously treated with Fosamax for unknown duration. DEXA 10/18/17: T-score -2.9.  She will be due for her next Prolia injection in April 2020.  She will continue taking a vitamin D supplement daily.  She cannot tolerate taking calcium supplement due to  Constipation.    Vitamin D deficiency: She takes a vitamin D supplement.   Primary osteoarthritis of left knee: No warmth or effusion.  She has limited left knee extension.  She has intermittent left knee joint pain.  She is going to follow up with Dr. Wynelle Link.  Status post total right knee replacement: Doing well.  No warmth or effusion.  Good ROM with no discomfort.   Primary osteoarthritis of both feet: She has no tenderness or discomfort at this time.   Fibromyalgia: She has generalized hyperalgesia on exam.  She has been having fibromyalgia flare for the past 1 month.  She is having increased generalized muscle aches muscle tenderness.  She is having trapezius muscle spasms bilaterally.  She requested a refill of Zanaflex.  A prescription for Zanaflex 4 mg by mouth at bedtime sent to the pharmacy today.  She continues to have bilateral trochanteric bursitis.  She was given a handout of exercises to perform.  She has been taking tramadol as prescribed and taking Advil for breakthrough pain relief.  UDS and narcotic treatment are up-to-date.  Her level of fatigue has been stable.  She has been sleeping well at night.  The importance of regular exercise and good sleep hygiene was discussed.  She will be starting to go to [employer] on a regular basis.  Exercises were discussed.  She plans on riding a stationary bike and walking for exercise.  Trochanteric bursitis of both hips: She has tenderness over bilateral trochanteric bursa bilaterally.  She was given a handout of exercises.   Other fatigue: Chronic   Primary  insomnia: She has been sleeping well at night.   Other medical conditions are listed as follows:   History of macular degeneration  History of hypothyroidism  History of hyperlipidemia  History of hypertension  History of anxiety  History of depression   Orders: No orders of the defined types were placed in this encounter.  Meds ordered this encounter  Medications  . diclofenac sodium (VOLTAREN) 1 % GEL    Sig: Apply 2 to 4 grams to affected joint up to 4 times daily PRN.    Dispense:  4 Tube    Refill:  2  . tiZANidine (ZANAFLEX) 4 MG tablet    Sig: Take 1 tablet (4 mg total) by mouth at bedtime as needed.    Dispense:  90 tablet    Refill:  0      Follow-Up Instructions: Return in about 6 months (around 07/02/2019) for Osteoporosis, Fibromyalgia.   Ofilia Neas, PA-C  Note - This record has been created using Dragon software.  Chart creation errors have been sought, but may not always  have been located. Such creation errors do not reflect on  the standard of medical care.

## 2018-12-24 ENCOUNTER — Telehealth: Payer: Self-pay | Admitting: Rheumatology

## 2018-12-24 ENCOUNTER — Other Ambulatory Visit: Payer: Self-pay | Admitting: Rheumatology

## 2018-12-24 MED ORDER — TRAMADOL HCL 50 MG PO TABS: ORAL_TABLET | ORAL | 2 refills | Status: DC

## 2018-12-24 NOTE — Telephone Encounter (Signed)
Advised patient that prescription was resent. Patient verbalized understanding.

## 2018-12-24 NOTE — Telephone Encounter (Signed)
ok 

## 2018-12-24 NOTE — Telephone Encounter (Signed)
Patient left a voicemail stating she had her urine test last Tuesday and requested a refill of Tramadol.  Patient states [facility] told her they have not received a refill request for this medication.  Patient requested a return call.

## 2018-12-24 NOTE — Telephone Encounter (Signed)
Patient was returning a call

## 2018-12-24 NOTE — Telephone Encounter (Signed)
Called [facility] to confirm and they did not receive tramadol rx that was faxed on 12/12/2018.  Last visit: 07/01/2018 Next visit: 01/01/2019 UDS: 12/16/2018 Narc agreement: 12/25/2017  Okay to resend tramadol?

## 2019-01-01 ENCOUNTER — Ambulatory Visit: Payer: Medicare HMO | Admitting: Physician Assistant

## 2019-01-01 ENCOUNTER — Encounter: Payer: Self-pay | Admitting: Physician Assistant

## 2019-01-01 VITALS — BP 124/81 | HR 76 | Resp 13 | Ht 60.5 in | Wt 169.0 lb

## 2019-01-01 DIAGNOSIS — M810 Age-related osteoporosis without current pathological fracture: Secondary | ICD-10-CM

## 2019-01-01 DIAGNOSIS — F5101 Primary insomnia: Secondary | ICD-10-CM

## 2019-01-01 DIAGNOSIS — M7061 Trochanteric bursitis, right hip: Secondary | ICD-10-CM

## 2019-01-01 DIAGNOSIS — M19071 Primary osteoarthritis, right ankle and foot: Secondary | ICD-10-CM

## 2019-01-01 DIAGNOSIS — Z96651 Presence of right artificial knee joint: Secondary | ICD-10-CM | POA: Diagnosis not present

## 2019-01-01 DIAGNOSIS — M1712 Unilateral primary osteoarthritis, left knee: Secondary | ICD-10-CM

## 2019-01-01 DIAGNOSIS — Z8669 Personal history of other diseases of the nervous system and sense organs: Secondary | ICD-10-CM

## 2019-01-01 DIAGNOSIS — E559 Vitamin D deficiency, unspecified: Secondary | ICD-10-CM | POA: Diagnosis not present

## 2019-01-01 DIAGNOSIS — R5383 Other fatigue: Secondary | ICD-10-CM

## 2019-01-01 DIAGNOSIS — Z8639 Personal history of other endocrine, nutritional and metabolic disease: Secondary | ICD-10-CM

## 2019-01-01 DIAGNOSIS — M797 Fibromyalgia: Secondary | ICD-10-CM

## 2019-01-01 DIAGNOSIS — Z8659 Personal history of other mental and behavioral disorders: Secondary | ICD-10-CM

## 2019-01-01 DIAGNOSIS — Z8679 Personal history of other diseases of the circulatory system: Secondary | ICD-10-CM

## 2019-01-01 DIAGNOSIS — M19072 Primary osteoarthritis, left ankle and foot: Secondary | ICD-10-CM

## 2019-01-01 DIAGNOSIS — M7062 Trochanteric bursitis, left hip: Secondary | ICD-10-CM

## 2019-01-01 MED ORDER — DICLOFENAC SODIUM 1 % TD GEL: TRANSDERMAL | 2 refills | Status: DC

## 2019-01-01 MED ORDER — TIZANIDINE HCL 4 MG PO TABS: 4.0000 mg | ORAL_TABLET | Freq: Every evening | ORAL | 0 refills | Status: DC | PRN

## 2019-01-01 NOTE — Patient Instructions (Signed)

## 2019-01-16 ENCOUNTER — Ambulatory Visit: Payer: Medicare Other | Admitting: Podiatry

## 2019-01-30 ENCOUNTER — Ambulatory Visit: Payer: Medicare Other | Admitting: Podiatry

## 2019-01-30 ENCOUNTER — Encounter: Payer: Self-pay | Admitting: Podiatry

## 2019-01-30 ENCOUNTER — Other Ambulatory Visit: Payer: Self-pay

## 2019-01-30 ENCOUNTER — Ambulatory Visit: Payer: Medicare Other

## 2019-01-30 VITALS — Temp 98.8°F

## 2019-01-30 DIAGNOSIS — M779 Enthesopathy, unspecified: Secondary | ICD-10-CM

## 2019-01-30 DIAGNOSIS — L600 Ingrowing nail: Secondary | ICD-10-CM | POA: Diagnosis not present

## 2019-01-30 MED ORDER — CEPHALEXIN 500 MG PO CAPS: 500.0000 mg | ORAL_CAPSULE | Freq: Three times a day (TID) | ORAL | 0 refills | Status: DC

## 2019-01-30 NOTE — Progress Notes (Signed)
Subjective:    Patient ID: Paula Murphy, female    DOB: June 12, 1946, 73 y.o.   MRN: [id number]  HPI 73 year old female presents the office today for concerns of chronic ingrown toenails that she does get pedicures every 2 weeks for.  She states on both big toes as well as the right second toe.  She wants to have the procedure done to remove the corners of the nails to help prevent coming back.  This is been a chronic issue for her and she denies any redness or drainage or any swelling currently.  States that she gets some pain to the ball of her right foot.  This is been ongoing for last couple weeks.  She denies any recent injury or trauma.  She does have orthotics at home but did not bring them with her.  This was a secondary issue for her today.   Review of Systems  All other systems reviewed and are negative.  Past Medical History:  Diagnosis Date  . Arthritis    OA AND PAIN RT KNEE  . Autoimmune disease ([place])    + ANA, and DS DNA--PT STATES SHE WAS TOLD SHE DOES NOT HAVE LUPUS AS PREVIOUSLY THOUGHT  . Fibromyalgia    followed by Dr. Estanislado Pandy  . Hemorrhoids   . Hyperlipidemia   . Hypertension    PAST HX OF HYPERTENSION - TOOK MEDICATION--BUT OFF MEDICATION FOR YEARS-NO LONGER A PROBLEM  . Hypothyroidism   . Macular degeneration    BOTH EYES  . PONV (postoperative nausea and vomiting)   . Thyroid disease    Hypothyroidism    Past Surgical History:  Procedure Laterality Date  . ABDOMINAL HYSTERECTOMY  1989  . BUNIONECTOMY Left 2012  . CHOLECYSTECTOMY  1996  . EYE SURGERY     BILATERAL CATARACT EXTRACTION  . KNEE ARTHROSCOPY  1994   Right Knee  . KNEE SURGERY    . NECK SURGERY  2003  . TONSILLECTOMY  1953  . TOTAL KNEE ARTHROPLASTY Right 03/02/2013   Procedure: RIGHT TOTAL KNEE ARTHROPLASTY;  Surgeon: Gearlean Alf, MD;  Location: [facility];  Service: Orthopedics;  Laterality: Right;     Current Outpatient Medications:  .  ALPRAZolam (XANAX) 0.5 MG tablet, TAKE  1 TABLET AT BEDTIME AS NEEDED., Disp: 30 tablet, Rfl: 0 .  aspirin EC 81 MG tablet, Take 81 mg by mouth every evening., Disp: , Rfl:  .  cephALEXin (KEFLEX) 500 MG capsule, Take 1 capsule (500 mg total) by mouth 3 (three) times daily., Disp: 21 capsule, Rfl: 0 .  Cholecalciferol (VITAMIN D3) 5000 units CAPS, Take by mouth daily., Disp: , Rfl:  .  cyanocobalamin 500 MCG tablet, Take 500 mcg by mouth daily., Disp: , Rfl:  .  denosumab (PROLIA) 60 MG/ML SOSY injection, Inject 60 mg into the skin every 6 (six) months., Disp: , Rfl:  .  diclofenac sodium (VOLTAREN) 1 % GEL, Apply 2 to 4 grams to affected joint up to 4 times daily PRN., Disp: 4 Tube, Rfl: 2 .  escitalopram (LEXAPRO) 10 MG tablet, TAKE 1 TABLET (10 MG TOTAL) BY MOUTH DAILY., Disp: , Rfl:  .  ezetimibe (ZETIA) 10 MG tablet, Take 10 mg by mouth., Disp: , Rfl:  .  fluticasone (FLONASE) 50 MCG/ACT nasal spray, as needed. , Disp: , Rfl:  .  ibuprofen (ADVIL,MOTRIN) 800 MG tablet, Take 1 tablet twice daily as needed for pain (caution with GI upset), Disp: 180 tablet, Rfl: 1 .  levothyroxine (  SYNTHROID, LEVOTHROID) 125 MCG tablet, Take 125 mcg by mouth daily before breakfast. , Disp: , Rfl:  .  ondansetron (ZOFRAN) 4 MG tablet, Take 4 mg by mouth as needed. , Disp: , Rfl: 1 .  simethicone (MYLICON) 270 MG chewable tablet, as needed. , Disp: , Rfl:  .  SUMAtriptan (IMITREX) 50 MG tablet, Take 50 mg by mouth as needed. , Disp: , Rfl:  .  tiZANidine (ZANAFLEX) 4 MG tablet, Take 1 tablet (4 mg total) by mouth at bedtime as needed., Disp: 90 tablet, Rfl: 0 .  topiramate (TOPAMAX) 50 MG tablet, Take 3 tablets (150 mg total) by mouth at bedtime., Disp: 270 tablet, Rfl: 0 .  traMADol (ULTRAM) 50 MG tablet, Take 1-2 tablets daily twice daily as needed, Disp: 120 tablet, Rfl: 2 .  valACYclovir (VALTREX) 500 MG tablet, Take 500 mg by mouth as needed. , Disp: , Rfl:   Allergies  Allergen Reactions  . Codeine Nausea And Vomiting    NAUSEA & VOMITING  .  Hydrocodone Nausea And Vomiting        Objective:   Physical Exam  General: AAO x3, NAD  Dermatological: There is incurvation present to both medial lateral aspects of bilateral hallux as well as the right second digit toenail with localized edema to the area but there is no significant erythema or increase in warmth.  There is no drainage or pus identified there is no ascending cellulitis.  No open lesions.  Vascular: Dorsalis Pedis artery and Posterior Tibial artery pedal pulses are 2/4 bilateral with immedate capillary fill time. There is no pain with calf compression, swelling, warmth, erythema.   Neruologic: Grossly intact via light touch bilateral.  Protective threshold with Semmes Wienstein monofilament intact to all pedal sites bilateral.   Musculoskeletal: There is some tenderness palpation of the right foot submetatarsal 2.  Bunion deformities present.  There is no area of pinpoint red tenderness or pain vibratory sensation.  Minimal edema to this area there is no erythema or warmth.  Gait: Unassisted, Nonantalgic.     Assessment & Plan:  73 year old female with chronic ingrown toenails; likely capsulitis right foot -Treatment options discussed including all alternatives, risks, and complications -Etiology of symptoms were discussed -At this time, the patient is requesting partial nail removal with chemical matricectomy to the symptomatic portion of the nail.  She wants to go ahead and go with all 3 toenails today understanding risks.  Risks and complications were discussed with the patient for which they understand and written consent was obtained. Under sterile conditions a total of 3 mL of a mixture of 2% lidocaine plain and 0.5% Marcaine plain was infiltrated in a hallux and digital block fashion. Once anesthetized, the skin was prepped in sterile fashion. A tourniquet was then applied. Next the medial and lateral aspect of hallux and second digit nail border was then sharply  excised making sure to remove the entire offending nail border. Once the nails were ensured to be removed area was debrided and the underlying skin was intact. There is no purulence identified in the procedure. Next phenol was then applied under standard conditions and copiously irrigated. Silvadene was applied. A dry sterile dressing was applied. After application of the dressing the tourniquet was removed and there is found to be an immediate capillary refill time to the digit. The patient tolerated the procedure well any complications. Post procedure instructions were discussed the patient for which he verbally understood. Follow-up in one week for nail check or  sooner if any problems are to arise. Discussed signs/symptoms of infection and directed to call the office immediately should any occur or go directly to the emergency room. In the meantime, encouraged to call the office with any questions, concerns, changes symptoms. -She has tramadol at home she can use for pain. -Keflex -Regards to the right foot pain will get x-ray next appointment most likely.  Also on her bring her orthotics with her.  Trula Slade DPM

## 2019-01-30 NOTE — Patient Instructions (Signed)

## 2019-02-02 ENCOUNTER — Telehealth: Payer: Self-pay | Admitting: Rheumatology

## 2019-02-02 DIAGNOSIS — L600 Ingrowing nail: Secondary | ICD-10-CM | POA: Insufficient documentation

## 2019-02-02 NOTE — Telephone Encounter (Signed)
Patient called requesting prescription refill of Tramadol to be sent to [facility].  Patient states she received an email from [facility] stating they were waiting for Dr. Arlean Hopping approval before refilling prescription.  Patient requested that you leave a message with information if possible due to her not being able to have her phone out at work.

## 2019-02-03 NOTE — Telephone Encounter (Signed)
Contacted [facility] as the prescription was sent in on  12/24/18 with 2 additional refills. Pharmacy advised that a refill has been sent to the patient on 01/28/19 and should arrive to the patient in 2-6 business days. Patient advised.

## 2019-02-05 ENCOUNTER — Ambulatory Visit (INDEPENDENT_AMBULATORY_CARE_PROVIDER_SITE_OTHER): Payer: Medicare Other

## 2019-02-05 ENCOUNTER — Encounter: Payer: Self-pay | Admitting: Podiatry

## 2019-02-05 ENCOUNTER — Ambulatory Visit (INDEPENDENT_AMBULATORY_CARE_PROVIDER_SITE_OTHER): Payer: Medicare Other | Admitting: Podiatry

## 2019-02-05 ENCOUNTER — Other Ambulatory Visit: Payer: Self-pay

## 2019-02-05 VITALS — Temp 97.5°F

## 2019-02-05 DIAGNOSIS — M779 Enthesopathy, unspecified: Secondary | ICD-10-CM

## 2019-02-05 DIAGNOSIS — M7751 Other enthesopathy of right foot: Secondary | ICD-10-CM

## 2019-02-05 DIAGNOSIS — L600 Ingrowing nail: Secondary | ICD-10-CM

## 2019-02-05 DIAGNOSIS — M21619 Bunion of unspecified foot: Secondary | ICD-10-CM

## 2019-02-05 NOTE — Patient Instructions (Signed)

## 2019-02-09 NOTE — Progress Notes (Signed)
Subjective: 73 year old female presents the office today for follow-up evaluation after undergoing bilateral hallux and second digit right side partial nail avulsions.  She said that she is doing well she is had very minimal discomfort.  She still been soaking Epson salts as well as applying antibiotic ointment and a bandage.  Denies any drainage or pus or any redness or swelling.  She started get some pain to the right second toe but this was after she went back to work.  This is improved.  Since she has been wearing an open toed shoe the foot pain on the right side has improved.  She has not had any significant discomfort.  She has had no swelling and she denies any numbness or tingling into the toes.  Denies any systemic complaints such as fevers, chills, nausea, vomiting. No acute changes since last appointment, and no other complaints at this time.   Objective: AAO x3, NAD DP/PT pulses palpable bilaterally, CRT less than 3 seconds Status post bilateral hallux and right second digit medial, lateral partial nail avulsions.  Granulation tissue and scab is present.  There is no drainage or pus there is no surrounding edema, erythema there is no ascending cellulitis.  There is no tenderness palpation. Significant bunion deformities present the right foot.  Subjectively submetatarsal 2 is where she was given majority discomfort and swelling there is no pain or swelling today.  There is no palpable neuroma.  There is no area pinpoint bony tenderness. No open lesions or pre-ulcerative lesions.  No pain with calf compression, swelling, warmth, erythema  Assessment: Healing procedure sites from partial nail avulsion; right foot second MPJ capsulitis/bunion deformity  Plan: -All treatment options discussed with the patient including all alternatives, risks, complications.  -X-rays were obtained and reviewed of the right foot.  Significant bunion deformities present with subluxation of the first MPJ.   Elongated second metatarsal.  No evidence of acute fracture. -In regards to the right foot pain discussed shoe gear modifications and orthotics.  Her orthotics appear to be a three-quarter length and she forgot to bring them.  I dispensed metatarsal pads for her to apply to the inserts in order to take pressure off the metatarsal head area.  Discussed biomechanical reasons for her discomfort. -From the procedure standpoint she is doing well.  I want her to continue soaking Epson salt daily as well as antibiotic ointment and a bandage on the day but can leave the area open at night.  Monitor for any signs or symptoms of infection. -Patient encouraged to call the office with any questions, concerns, change in symptoms.   Trula Slade DPM

## 2019-03-02 ENCOUNTER — Other Ambulatory Visit: Payer: Self-pay | Admitting: Rheumatology

## 2019-03-03 NOTE — Telephone Encounter (Signed)
Last Visit: 01/01/2019 Next Visit: 07/02/2019  Okay to refill per Dr. Estanislado Pandy.

## 2019-03-19 ENCOUNTER — Other Ambulatory Visit: Payer: Self-pay | Admitting: Rheumatology

## 2019-03-19 ENCOUNTER — Other Ambulatory Visit: Payer: Self-pay | Admitting: Physician Assistant

## 2019-03-19 MED ORDER — TRAMADOL HCL 50 MG PO TABS: ORAL_TABLET | ORAL | 2 refills | Status: DC

## 2019-03-19 MED ORDER — TOPIRAMATE 50 MG PO TABS: 150.0000 mg | ORAL_TABLET | Freq: Every day | ORAL | 0 refills | Status: DC

## 2019-03-19 MED ORDER — ALPRAZOLAM 0.5 MG PO TABS: 0.5000 mg | ORAL_TABLET | Freq: Every evening | ORAL | 0 refills | Status: DC | PRN

## 2019-03-19 NOTE — Telephone Encounter (Signed)
Okay to refill tramadol and Topamax.  She needs CBC and CMP prior to refill of ibuprofen.

## 2019-03-19 NOTE — Telephone Encounter (Signed)
Patient advised she will need to update labs prior to refilling Ibuprofen. Patient verbalized understanding.   Refill request received via fax for Xanax and okay to refill per Dr. Estanislado Pandy

## 2019-03-19 NOTE — Telephone Encounter (Signed)
Last Visit: 01/01/2019 Next Visit: 07/02/2019  Okay to refill per Dr. Estanislado Pandy.

## 2019-03-19 NOTE — Telephone Encounter (Signed)
Last Visit: 01/01/2019 Next Visit: 07/02/2019 UDS: 12/16/18 Narc Agreement: 12/16/18  Okay to refill Tramadol, Topiramate and Ibuprofen?

## 2019-03-23 ENCOUNTER — Other Ambulatory Visit: Payer: Self-pay | Admitting: *Deleted

## 2019-03-23 DIAGNOSIS — Z79899 Other long term (current) drug therapy: Secondary | ICD-10-CM

## 2019-03-24 ENCOUNTER — Telehealth: Payer: Self-pay | Admitting: Pharmacist

## 2019-03-24 LAB — COMPLETE METABOLIC PANEL WITH GFR
AG Ratio: 1.7 (calc) (ref 1.0–2.5)
ALT: 11 U/L (ref 6–29)
AST: 15 U/L (ref 10–35)
Albumin: 3.9 g/dL (ref 3.6–5.1)
Alkaline phosphatase (APISO): 50 U/L (ref 37–153)
BUN/Creatinine Ratio: 23 (calc) — ABNORMAL HIGH (ref 6–22)
BUN: 24 mg/dL (ref 7–25)
CO2: 23 mmol/L (ref 20–32)
Calcium: 9.1 mg/dL (ref 8.6–10.4)
Chloride: 109 mmol/L (ref 98–110)
Creat: 1.04 mg/dL — ABNORMAL HIGH (ref 0.60–0.93)
GFR, Est African American: 62 mL/min/{1.73_m2} (ref >=60)
GFR, Est Non African American: 54 mL/min/{1.73_m2} — ABNORMAL LOW (ref >=60)
Globulin: 2.3 g/dL (calc) (ref 1.9–3.7)
Glucose, Bld: 109 mg/dL — ABNORMAL HIGH (ref 65–99)
Potassium: 4.2 mmol/L (ref 3.5–5.3)
Sodium: 140 mmol/L (ref 135–146)
Total Bilirubin: 0.3 mg/dL (ref 0.2–1.2)
Total Protein: 6.2 g/dL (ref 6.1–8.1)

## 2019-03-24 LAB — CBC WITH DIFFERENTIAL/PLATELET
Absolute Monocytes: 407 cells/uL (ref 200–950)
Basophils Absolute: 30 cells/uL (ref 0–200)
Basophils Relative: 0.5 %
Eosinophils Absolute: 148 cells/uL (ref 15–500)
Eosinophils Relative: 2.5 %
HCT: 38.7 % (ref 35.0–45.0)
Hemoglobin: 12.9 g/dL (ref 11.7–15.5)
Lymphs Abs: 2661 cells/uL (ref 850–3900)
MCH: 31.5 pg (ref 27.0–33.0)
MCHC: 33.3 g/dL (ref 32.0–36.0)
MCV: 94.4 fL (ref 80.0–100.0)
MPV: 11 fL (ref 7.5–12.5)
Monocytes Relative: 6.9 %
Neutro Abs: 2655 cells/uL (ref 1500–7800)
Neutrophils Relative %: 45 %
Platelets: 231 10*3/uL (ref 140–400)
RBC: 4.1 10*6/uL (ref 3.80–5.10)
RDW: 12.1 % (ref 11.0–15.0)
Total Lymphocyte: 45.1 %
WBC: 5.9 10*3/uL (ref 3.8–10.8)

## 2019-03-24 NOTE — Telephone Encounter (Signed)
Patient inquired about having Prolia done in office vs the hospital.  Ran test claim and not covered through her Part D plan since it is covered through Part B.  Informed patient she will need to continue to receiving at the hospital.  Patient verbalized understanding.  She prefers to delay vaccine as she does not want to go to the hospital due to increased exposure to Covid-19.  Instructed patient to call when she is ready for [place] to send in her orders.  Patient verbalized understanding.  All questions encouraged and answered.  Instructed patient to call with any further questions or concerns.  Mariella Saa, PharmD, [facility] Rheumatology Clinical Pharmacist  03/24/2019 3:06 PM

## 2019-03-24 NOTE — Progress Notes (Signed)
Creatinine is elevated.  Patient should avoid taking ibuprofen and all other over-the-counter NSAIDs.

## 2019-04-07 ENCOUNTER — Telehealth: Payer: Self-pay | Admitting: Pharmacist

## 2019-04-07 NOTE — Telephone Encounter (Signed)
Received fax from [place] [facility] of retrospective drug utilization review.  Potential clinical concern opioid risk management as patient currently fills tizanidine, tramadol, and alprazolam which can increase the risk of CNS side effects.  Patient has signed narcotic agreement and obtains UDS at appropriate intervals.  She feels her tramadol on time.  She takes alprazolam as needed and tizanidine as needed during fibromyalgia flares.  Patient understands the risk of this combination of medications.  Recommend continuing current medication regimen and we will continue to monitor.  We will send document to scan center.  Mariella Saa, PharmD, [facility] Rheumatology Clinical Pharmacist  04/07/2019 10:56 AM

## 2019-04-07 NOTE — Telephone Encounter (Signed)
Thank you for informing me.

## 2019-04-14 ENCOUNTER — Other Ambulatory Visit: Payer: Self-pay | Admitting: Internal Medicine

## 2019-04-14 DIAGNOSIS — Z1231 Encounter for screening mammogram for malignant neoplasm of breast: Secondary | ICD-10-CM

## 2019-04-25 ENCOUNTER — Other Ambulatory Visit: Payer: Self-pay | Admitting: Rheumatology

## 2019-04-27 MED ORDER — TIZANIDINE HCL 4 MG PO TABS: 4.0000 mg | ORAL_TABLET | Freq: Every evening | ORAL | 0 refills | Status: DC | PRN

## 2019-04-27 MED ORDER — ALPRAZOLAM 0.5 MG PO TABS: 0.5000 mg | ORAL_TABLET | Freq: Every evening | ORAL | 0 refills | Status: DC | PRN

## 2019-04-27 NOTE — Telephone Encounter (Signed)
ok 

## 2019-04-27 NOTE — Telephone Encounter (Signed)
Last Visit:01/01/2019 Next Visit:07/02/2019  Due to labs on 03/23/19. Patient advised to avoid Ibuprofen.   Okay to refill Xanax and Tizanidine?

## 2019-05-03 ENCOUNTER — Other Ambulatory Visit: Payer: Self-pay | Admitting: Rheumatology

## 2019-05-18 ENCOUNTER — Other Ambulatory Visit: Payer: Self-pay | Admitting: Rheumatology

## 2019-05-27 ENCOUNTER — Ambulatory Visit
Admission: RE | Admit: 2019-05-27 | Discharge: 2019-05-27 | Disposition: A | Discharge status: Home / Self Care | Payer: Medicare Other | Source: Ambulatory Visit | Attending: Internal Medicine | Admitting: Internal Medicine

## 2019-05-27 ENCOUNTER — Other Ambulatory Visit: Payer: Self-pay

## 2019-05-27 DIAGNOSIS — Z1231 Encounter for screening mammogram for malignant neoplasm of breast: Secondary | ICD-10-CM

## 2019-06-18 NOTE — Progress Notes (Deleted)
Office Visit Note  Patient: Paula Murphy             Date of Birth: 1946-10-04           MRN: [id number]             PCP: Arlyss Repress, MD Referring: Arlyss Repress, MD Visit Date: 07/02/2019 Occupation: @GUAROCC @  Subjective:  No chief complaint on file.  She is on Prolia 60 mg every 6 months started in April 2019,  Along with vitamin D 5000 units daily.   She was previously treated with Fosamax for unknown duration. Last DEXA on 10/18/17 showed T-score -2.9 at right femur neck with statistically significant difference in BMD in bilateral hips . She receives Prolia at the hospital and last injection was 08/19/18. She delayed her last injection due to concerns for Covid-19.Due for repeat DEXA December 2020.  History of Present Illness: Paula Murphy is a 73 y.o. female ***   Activities of Daily Living:  Patient reports morning stiffness for *** {minute/hour:19697}.   Patient {ACTIONS;DENIES/REPORTS:21021675::"Denies"} nocturnal pain.  Difficulty dressing/grooming: {ACTIONS;DENIES/REPORTS:21021675::"Denies"} Difficulty climbing stairs: {ACTIONS;DENIES/REPORTS:21021675::"Denies"} Difficulty getting out of chair: {ACTIONS;DENIES/REPORTS:21021675::"Denies"} Difficulty using hands for taps, buttons, cutlery, and/or writing: {ACTIONS;DENIES/REPORTS:21021675::"Denies"}  No Rheumatology ROS completed.   PMFS History:  Patient Active Problem List   Diagnosis Date Noted  . Ingrown toenail 02/02/2019  . Primary osteoarthritis of both feet 03/21/2017  . History of macular degeneration 03/21/2017  . Other fatigue 03/21/2017  . Age-related osteoporosis without current pathological fracture 03/21/2017  . Esophageal reflux 06/29/2013  . Actinic keratosis 04/11/2013  . Systemic lupus erythematosus ([place]) 04/11/2013  . OA (osteoarthritis) of knee 03/02/2013  . DEPRESSION 07/24/2010  . Major depressive disorder, single episode, unspecified 07/24/2010  . UTI 07/04/2010  . ABDOMINAL  BLOATING 01/20/2010  . Fibromyalgia 10/04/2009  . INSOMNIA, CHRONIC 10/04/2009  . Autoimmune disease ([place]) 10/04/2009  . Vitamin D deficiency 10/26/2008  . HYPOTHYROIDISM 08/13/2008  . HYPERLIPIDEMIA 08/13/2008  . HYPERTENSION 08/13/2008  . HEADACHE 08/13/2008  . Muscle pain 08/12/2008    Past Medical History:  Diagnosis Date  . Arthritis    OA AND PAIN RT KNEE  . Autoimmune disease ([place])    + ANA, and DS DNA--PT STATES SHE WAS TOLD SHE DOES NOT HAVE LUPUS AS PREVIOUSLY THOUGHT  . Fibromyalgia    followed by Dr. Estanislado Pandy  . Hemorrhoids   . Hyperlipidemia   . Hypertension    PAST HX OF HYPERTENSION - TOOK MEDICATION--BUT OFF MEDICATION FOR YEARS-NO LONGER A PROBLEM  . Hypothyroidism   . Macular degeneration    BOTH EYES  . PONV (postoperative nausea and vomiting)   . Thyroid disease    Hypothyroidism    Family History  Problem Relation Age of Onset  . Stroke Mother   . Hypertension Mother   . Hyperlipidemia Mother   . Cancer Mother        Lung  . Alzheimer's disease Mother   . Stroke Father   . Hypertension Father   . Diabetes Father        Type II  . Hyperlipidemia Father   . Cancer Father        Lung  . Arthritis Father        Rheumatoid  . Thyroid disease Daughter    Past Surgical History:  Procedure Laterality Date  . ABDOMINAL HYSTERECTOMY  1989  . BUNIONECTOMY Left 2012  . CHOLECYSTECTOMY  1996  . EYE SURGERY     BILATERAL CATARACT EXTRACTION  .  KNEE ARTHROSCOPY  1994   Right Knee  . KNEE SURGERY    . NECK SURGERY  2003  . TONSILLECTOMY  1953  . TOTAL KNEE ARTHROPLASTY Right 03/02/2013   Procedure: RIGHT TOTAL KNEE ARTHROPLASTY;  Surgeon: Gearlean Alf, MD;  Location: [facility];  Service: Orthopedics;  Laterality: Right;   Social History   Social History Narrative   Retired   Married   1 daughter/1 grandson   Never Smoked   Alcohol use - yes (social)   Immunization History  Administered Date(s) Administered  . Influenza Whole 08/12/2008,  08/30/2009, 08/23/2010  . Pneumococcal Polysaccharide-23 03/29/2009  . Td 07/22/2007     Objective: Vital Signs: There were no vitals taken for this visit.   Physical Exam   Musculoskeletal Exam: ***  CDAI Exam: CDAI Score: - Patient Global: -; Provider Global: - Swollen: -; Tender: - Joint Exam   No joint exam has been documented for this visit   There is currently no information documented on the homunculus. Go to the Rheumatology activity and complete the homunculus joint exam.  Investigation: No additional findings.  Imaging: Mm 3d Screen Breast Bilateral  Result Date: 05/28/2019 CLINICAL DATA:  Screening. EXAM: DIGITAL SCREENING BILATERAL MAMMOGRAM WITH TOMO AND CAD COMPARISON:  Previous exam(s). ACR Breast Density Category b: There are scattered areas of fibroglandular density. FINDINGS: There are no findings suspicious for malignancy. Images were processed with CAD. IMPRESSION: No mammographic evidence of malignancy. A result letter of this screening mammogram will be mailed directly to the patient. RECOMMENDATION: Screening mammogram in one year. (Code:SM-B-01Y) BI-RADS CATEGORY  1: Negative. Electronically Signed   By: Margarette Canada M.D.   On: 05/28/2019 11:43    Recent Labs: Lab Results  Component Value Date   WBC 5.9 03/23/2019   HGB 12.9 03/23/2019   PLT 231 03/23/2019   NA 140 03/23/2019   K 4.2 03/23/2019   CL 109 03/23/2019   CO2 23 03/23/2019   GLUCOSE 109 (H) 03/23/2019   BUN 24 03/23/2019   CREATININE 1.04 (H) 03/23/2019   BILITOT 0.3 03/23/2019   ALKPHOS 77 02/24/2013   AST 15 03/23/2019   ALT 11 03/23/2019   PROT 6.2 03/23/2019   ALBUMIN 3.5 02/24/2013   CALCIUM 9.1 03/23/2019   GFRAA 62 03/23/2019    Speciality Comments: Osteoporosis therapy: Prolia started in April 2019  Procedures:  No procedures performed Allergies: Codeine and Hydrocodone   Assessment / Plan:     Visit Diagnoses: No diagnosis found.  Orders: No orders of the  defined types were placed in this encounter.  No orders of the defined types were placed in this encounter.   Face-to-face time spent with patient was *** minutes. Greater than 50% of time was spent in counseling and coordination of care.  Follow-Up Instructions: No follow-ups on file.   Earnestine Mealing, CMA  Note - This record has been created using Editor, commissioning.  Chart creation errors have been sought, but may not always  have been located. Such creation errors do not reflect on  the standard of medical care.

## 2019-07-02 ENCOUNTER — Telehealth: Payer: Self-pay

## 2019-07-02 ENCOUNTER — Other Ambulatory Visit: Payer: Self-pay

## 2019-07-02 ENCOUNTER — Encounter: Payer: Self-pay | Admitting: Rheumatology

## 2019-07-02 ENCOUNTER — Other Ambulatory Visit: Payer: Self-pay | Admitting: Rheumatology

## 2019-07-02 ENCOUNTER — Telehealth (INDEPENDENT_AMBULATORY_CARE_PROVIDER_SITE_OTHER): Payer: Medicare Other | Admitting: Rheumatology

## 2019-07-02 DIAGNOSIS — Z8679 Personal history of other diseases of the circulatory system: Secondary | ICD-10-CM

## 2019-07-02 DIAGNOSIS — Z79899 Other long term (current) drug therapy: Secondary | ICD-10-CM | POA: Diagnosis not present

## 2019-07-02 DIAGNOSIS — M797 Fibromyalgia: Secondary | ICD-10-CM

## 2019-07-02 DIAGNOSIS — Z8639 Personal history of other endocrine, nutritional and metabolic disease: Secondary | ICD-10-CM

## 2019-07-02 DIAGNOSIS — M19072 Primary osteoarthritis, left ankle and foot: Secondary | ICD-10-CM

## 2019-07-02 DIAGNOSIS — Z8659 Personal history of other mental and behavioral disorders: Secondary | ICD-10-CM

## 2019-07-02 DIAGNOSIS — E559 Vitamin D deficiency, unspecified: Secondary | ICD-10-CM | POA: Diagnosis not present

## 2019-07-02 DIAGNOSIS — Z96651 Presence of right artificial knee joint: Secondary | ICD-10-CM

## 2019-07-02 DIAGNOSIS — M19071 Primary osteoarthritis, right ankle and foot: Secondary | ICD-10-CM

## 2019-07-02 DIAGNOSIS — Z5181 Encounter for therapeutic drug level monitoring: Secondary | ICD-10-CM

## 2019-07-02 DIAGNOSIS — M7062 Trochanteric bursitis, left hip: Secondary | ICD-10-CM

## 2019-07-02 DIAGNOSIS — F5101 Primary insomnia: Secondary | ICD-10-CM

## 2019-07-02 DIAGNOSIS — R5383 Other fatigue: Secondary | ICD-10-CM

## 2019-07-02 DIAGNOSIS — M1712 Unilateral primary osteoarthritis, left knee: Secondary | ICD-10-CM | POA: Diagnosis not present

## 2019-07-02 DIAGNOSIS — M62838 Other muscle spasm: Secondary | ICD-10-CM

## 2019-07-02 DIAGNOSIS — M810 Age-related osteoporosis without current pathological fracture: Secondary | ICD-10-CM

## 2019-07-02 DIAGNOSIS — M7061 Trochanteric bursitis, right hip: Secondary | ICD-10-CM

## 2019-07-02 DIAGNOSIS — Z8669 Personal history of other diseases of the nervous system and sense organs: Secondary | ICD-10-CM

## 2019-07-02 NOTE — Telephone Encounter (Signed)
Last Visit: 01/01/2019 Next Visit: 07/02/2019  Okay to refill per Dr. Deveshwar.  

## 2019-07-02 NOTE — Telephone Encounter (Signed)
Patient is due for prolia injection. Labs can be obtained at appointment in one month from now and then administer prolia 10 days later. Is prolia approved? Please advise.

## 2019-07-02 NOTE — Progress Notes (Signed)
Virtual Visit via Video Note  I connected with Paula Murphy on 07/02/19 at  4:00 PM EDT by a video enabled telemedicine application and verified that I am speaking with the correct person using two identifiers.  Location: Patient: Home  Provider: Clinic  This service was conducted via virtual visit.  Both audio and visual tools were used.  The patient was located at home. I was located in my office.  Consent was obtained prior to the virtual visit and is aware of possible charges through their insurance for this visit.  The patient is an established patient.  Dr. Estanislado Pandy, MD conducted the virtual visit and Hazel Sams, PA-C acted as scribe during the service.  Office staff helped with scheduling follow up visits after the service was conducted.   I discussed the limitations of evaluation and management by telemedicine and the availability of in person appointments. The patient expressed understanding and agreed to proceed.  CC: Generalized pain  History of Present Illness: Patient is a 73 year old female with a past medical history of osteoporosis, fibromyalgia, and osteoarthritis.  She has been experiencing more frequent and severe fibromyalgia flares.  She has generalized muscle aches and muscle tenderness.  She has bilateral trochanter bursitis.  She would like to schedule trochanter bursitis cortisone injections at her follow-up visit in 1 month.  She would also like a referral to physical therapy.  She is been having trapezius muscle tension and muscle tenderness bilaterally.  She takes zanaflex 4 mg by mouth at bedtime to help with muscle spasms.  She has been taking tramadol 50 mg 2 tablets by mouth daily but plans on increasing to 3 tablets daily due to the increased pain she has been experiencing.  She is also been having worsening fatigue related to insomnia. She receives Prolia 60 mg sq injections every 6 months for the management of osteoporosis.  She is due for Prolia injection.  She  will have lab work drawn at her follow-up visit prior to scheduling the next Prolia injection.  Review of Systems  Constitutional: Positive for malaise/fatigue. Negative for fever.  Eyes: Negative for photophobia, pain, discharge and redness.  Respiratory: Negative for cough, shortness of breath and wheezing.   Cardiovascular: Negative for chest pain and palpitations.  Gastrointestinal: Negative for blood in stool, constipation and diarrhea.  Genitourinary: Negative for dysuria.  Musculoskeletal: Positive for back pain, joint pain, myalgias and neck pain.  Skin: Negative for rash.  Neurological: Negative for dizziness and headaches.  Psychiatric/Behavioral: Negative for depression. The patient has insomnia. The patient is not nervous/anxious.       Observations/Objective: Physical Exam  Constitutional: She is oriented to person, place, and time and well-developed, well-nourished, and in no distress.  HENT:  Head: Normocephalic and atraumatic.  Eyes: Conjunctivae are normal.  Pulmonary/Chest: Effort normal.  Neurological: She is alert and oriented to person, place, and time.  Psychiatric: Mood, memory, affect and judgment normal.     Patient reports morning stiffness for 5 minutes.   Patient reports nocturnal pain.  Difficulty dressing/grooming: Denies Difficulty climbing stairs: Reports Difficulty getting out of chair: Denies Difficulty using hands for taps, buttons, cutlery, and/or writing: Reports   Assessment and Plan: Visit Diagnoses: Age-related osteoporosis without current pathological fracture -  She is on Prolia every 6 months (started in April 2019).  She is overdue for prolia sq injection.  We will schedule the next prolia injection after she has lab work drawn in 1 month at follow up visit.  She will require lab work prior to the injection and 10 days after the injection. Previously treated with Fosamax for unknown duration. DEXA 10/18/17: T-score -2.9.  She will  continue taking a vitamin D supplement daily.  She cannot tolerate taking calcium supplement due to constipation.    Vitamin D deficiency: She takes a vitamin D supplement.   Primary osteoarthritis of left knee:  She has limited left knee extension.  She has intermittent left knee joint pain.  She is followed by Dr. Wynelle Link.  Status post total right knee replacement: Doing well. She has no discomfort at this time.  Primary osteoarthritis of both feet: She has no feet pain at this time.   Fibromyalgia: She is having generalized muscle aches and muscle tenderness due to fibromyalgia.  She has been having more frequent and severe fibromyalgia flares. She has trapezius muscle tenderness and tension bilaterally. She is having severe trochanteric bursitis, and she would like a referral to physical therapy. She has been having worsening fatigue recently due to insomnia.  She has been taking tramadol 50 mg 2 tablets by mouth daily for pain relief but is going to increase to 3 times daily.  She takes Xanax very sparingly.  She continues to take Zanaflex 4 mg by mouth at bedtime as needed for muscle spasms.  We discussed the importance of good sleep hygiene and regular exercise.  She declined referral to pain management at this time.  She plans on increasing her dose of tramadol from 50 mg 2 tablets by mouth daily to 3 tablets by mouth daily for pain relief.  We discussed the importance of avoiding taking Xanax on the same day she takes tramadol.  She is due to update UDS and narcotic agreement.  Future orders were placed today  Trochanteric bursitis of both hips: She has been having increased trochanteric bursitis tenderness bilaterally.  She is having pain with ambulation and at night laying on her sides.  We will place a referral to [facility] for physical therapy.   Trapezius muscle spasms: She has been experiencing trapezius muscle tension and tenderness bilaterally.  She has spasms occasionally. She  would like a referral to PT.   Other fatigue: She has been experiencing worsening fatigue recently.   Primary insomnia: She has been having difficulty sleeping at night due to nocturnal pain. She takes tramadol as needed for pain relief.  She takes Topamax 50 mg 3 tablets by mouth at bedtime.  She continues takes Zanaflex 4 mg by mouth at bedtime as needed for muscle spasms.  Other medical conditions are listed as follows:   History of macular degeneration  History of hypothyroidism  History of hyperlipidemia  History of hypertension  History of anxiety  History of depression   Follow Up Instructions: She will follow up in 1 month  She will require labs at follow up visit prior to starting on prolia PT referral placed today    I discussed the assessment and treatment plan with the patient. The patient was provided an opportunity to ask questions and all were answered. The patient agreed with the plan and demonstrated an understanding of the instructions.   The patient was advised to call back or seek an in-person evaluation if the symptoms worsen or if the condition fails to improve as anticipated.  I provided 30 minutes of non-face-to-face time during this encounter. Bo Merino, MD   Scribed by-  Ofilia Neas, PA-C

## 2019-07-06 NOTE — Telephone Encounter (Signed)
Noted  

## 2019-07-30 ENCOUNTER — Ambulatory Visit: Payer: Medicare Other | Admitting: Rheumatology

## 2019-09-08 ENCOUNTER — Other Ambulatory Visit: Payer: Self-pay | Admitting: Rheumatology

## 2019-09-08 NOTE — Telephone Encounter (Signed)
Last Visit: 07/02/19 Next Visit: 09/24/19  Okay to refill per Dr. Estanislado Pandy

## 2019-09-10 ENCOUNTER — Other Ambulatory Visit: Payer: Self-pay | Admitting: Rheumatology

## 2019-09-10 NOTE — Telephone Encounter (Signed)
Last Visit: 07/02/19 Next Visit: 09/24/19 UDS: 12/16/18 Narc Agreement: 12/16/18   Okay to refill Tramadol?

## 2019-09-10 NOTE — Progress Notes (Signed)
Office Visit Note  Patient: Paula Murphy             Date of Birth: May 21, 1946           MRN: [id number]             PCP: Arlyss Repress, MD Referring: Arlyss Repress, MD Visit Date: 09/24/2019 Occupation: _0 @  Subjective:  Increased fatigue and joint pain.   History of Present Illness: Paula Murphy is a 73 y.o. female with history of fibromyalgia, osteoporosis and osteoarthritis.  Patient states she did not get Prolia injections since the Covid started.  She would like to schedule that.  She has been also experiencing increased fatigue recently.  She states she went to the beach and walked quite a lot.  When she came back she started having pain and discomfort in the right foot.  She is still continues to have discomfort.  She has been going for physical therapy which has been helpful.  Her knee joints continue to hurt.  The trochanteric bursitis is better.  She gets massage over the trochanteric area and has been using Voltaren gel.  Activities of Daily Living:  Patient reports morning stiffness for less than 1 minute.   Patient Reports nocturnal pain.  Difficulty dressing/grooming: Denies Difficulty climbing stairs: Denies Difficulty getting out of chair: Denies Difficulty using hands for taps, buttons, cutlery, and/or writing: Reports  Review of Systems  Constitutional: Positive for fatigue. Negative for night sweats, weight gain and weight loss.  HENT: Negative for mouth sores, trouble swallowing, trouble swallowing, mouth dryness and nose dryness.   Eyes: Negative for pain, redness, itching, visual disturbance and dryness.  Respiratory: Negative for cough, shortness of breath and difficulty breathing.   Cardiovascular: Negative for chest pain, palpitations, hypertension, irregular heartbeat and swelling in legs/feet.  Gastrointestinal: Negative for blood in stool, constipation and diarrhea.  Endocrine: Negative for increased urination.  Genitourinary: Negative for  difficulty urinating, painful urination and vaginal dryness.  Musculoskeletal: Positive for arthralgias, joint pain, morning stiffness and muscle tenderness. Negative for joint swelling, myalgias, muscle weakness and myalgias.  Skin: Negative for color change, rash, hair loss, skin tightness, ulcers and sensitivity to sunlight.  Allergic/Immunologic: Negative for susceptible to infections.  Neurological: Negative for dizziness, numbness, headaches, memory loss, night sweats and weakness.  Hematological: Negative for bruising/bleeding tendency and swollen glands.  Psychiatric/Behavioral: Negative for depressed mood, confusion and sleep disturbance. The patient is not nervous/anxious.     PMFS History:  Patient Active Problem List   Diagnosis Date Noted  . Ingrown toenail 02/02/2019  . Primary osteoarthritis of both feet 03/21/2017  . History of macular degeneration 03/21/2017  . Other fatigue 03/21/2017  . Age-related osteoporosis without current pathological fracture 03/21/2017  . Esophageal reflux 06/29/2013  . Actinic keratosis 04/11/2013  . Systemic lupus erythematosus ([place]) 04/11/2013  . OA (osteoarthritis) of knee 03/02/2013  . DEPRESSION 07/24/2010  . Major depressive disorder, single episode, unspecified 07/24/2010  . UTI 07/04/2010  . ABDOMINAL BLOATING 01/20/2010  . Fibromyalgia 10/04/2009  . INSOMNIA, CHRONIC 10/04/2009  . Autoimmune disease ([place]) 10/04/2009  . Vitamin D deficiency 10/26/2008  . HYPOTHYROIDISM 08/13/2008  . HYPERLIPIDEMIA 08/13/2008  . HYPERTENSION 08/13/2008  . HEADACHE 08/13/2008  . Muscle pain 08/12/2008    Past Medical History:  Diagnosis Date  . Arthritis    OA AND PAIN RT KNEE  . Autoimmune disease ([place])    + ANA, and DS DNA--PT STATES SHE WAS TOLD SHE DOES NOT HAVE LUPUS  AS PREVIOUSLY THOUGHT  . Fibromyalgia    followed by Dr. Estanislado Pandy  . Hemorrhoids   . Hyperlipidemia   . Hypertension    PAST HX OF HYPERTENSION - TOOK MEDICATION--BUT  OFF MEDICATION FOR YEARS-NO LONGER A PROBLEM  . Hypothyroidism   . Macular degeneration    BOTH EYES  . PONV (postoperative nausea and vomiting)   . Thyroid disease    Hypothyroidism    Family History  Problem Relation Age of Onset  . Stroke Mother   . Hypertension Mother   . Hyperlipidemia Mother   . Cancer Mother        Lung  . Alzheimer's disease Mother   . Stroke Father   . Hypertension Father   . Diabetes Father        Type II  . Hyperlipidemia Father   . Cancer Father        Lung  . Arthritis Father        Rheumatoid  . Thyroid disease Daughter    Past Surgical History:  Procedure Laterality Date  . ABDOMINAL HYSTERECTOMY  1989  . BUNIONECTOMY Left 2012  . CHOLECYSTECTOMY  1996  . EYE SURGERY     BILATERAL CATARACT EXTRACTION  . KNEE ARTHROSCOPY  1994   Right Knee  . KNEE SURGERY    . NECK SURGERY  2003  . TONSILLECTOMY  1953  . TOTAL KNEE ARTHROPLASTY Right 03/02/2013   Procedure: RIGHT TOTAL KNEE ARTHROPLASTY;  Surgeon: Gearlean Alf, MD;  Location: [facility];  Service: Orthopedics;  Laterality: Right;   Social History   Social History Narrative   Retired   Married   1 daughter/1 grandson   Never Smoked   Alcohol use - yes (social)   Immunization History  Administered Date(s) Administered  . Influenza Whole 08/12/2008, 08/30/2009, 08/23/2010  . Pneumococcal Polysaccharide-23 03/29/2009  . Td 07/22/2007     Objective: Vital Signs: BP 134/84 (BP Location: Left Arm, Patient Position: Sitting, Cuff Size: Normal)   Pulse 95   Resp 13   Ht _0  (1.549 m)   Wt 170 lb 6.4 oz (77.3 kg)   BMI 32.20 kg/m    Physical Exam Vitals signs and nursing note reviewed.  Constitutional:      Appearance: She is well-developed.  HENT:     Head: Normocephalic and atraumatic.  Eyes:     Conjunctiva/sclera: Conjunctivae normal.  Neck:     Musculoskeletal: Normal range of motion.  Cardiovascular:     Rate and Rhythm: Normal rate and regular rhythm.     Heart  sounds: Normal heart sounds.  Pulmonary:     Effort: Pulmonary effort is normal.     Breath sounds: Normal breath sounds.  Abdominal:     General: Bowel sounds are normal.     Palpations: Abdomen is soft.  Lymphadenopathy:     Cervical: No cervical adenopathy.  Skin:    General: Skin is warm and dry.     Capillary Refill: Capillary refill takes less than 2 seconds.  Neurological:     Mental Status: She is alert and oriented to person, place, and time.  Psychiatric:        Behavior: Behavior normal.      Musculoskeletal Exam: C-spine thoracic and lumbar spine were in good range of motion.  Shoulder joints, elbow joints, wrist joints, MCPs and PIPs with good range of motion with no synovitis.  Hip joints, knee joints, ankles MTPs PIPs DIPs with good range of motion.  Her  right foot has hammertoes and right second MTP is prominent on the plantar aspect which causes pressure symptoms.  CDAI Exam: CDAI Score: - Patient Global: -; Provider Global: - Swollen: -; Tender: - Joint Exam   No joint exam has been documented for this visit   There is currently no information documented on the homunculus. Go to the Rheumatology activity and complete the homunculus joint exam.  Investigation: No additional findings.  Imaging: No results found.  Recent Labs: Lab Results  Component Value Date   WBC 5.9 03/23/2019   HGB 12.9 03/23/2019   PLT 231 03/23/2019   NA 140 03/23/2019   K 4.2 03/23/2019   CL 109 03/23/2019   CO2 23 03/23/2019   GLUCOSE 109 (H) 03/23/2019   BUN 24 03/23/2019   CREATININE 1.04 (H) 03/23/2019   BILITOT 0.3 03/23/2019   ALKPHOS 77 02/24/2013   AST 15 03/23/2019   ALT 11 03/23/2019   PROT 6.2 03/23/2019   ALBUMIN 3.5 02/24/2013   CALCIUM 9.1 03/23/2019   GFRAA 62 03/23/2019    Speciality Comments: Osteoporosis therapy: Prolia started in April 2019  Procedures:  No procedures performed Allergies: Codeine and Hydrocodone   Assessment / Plan:     Visit  Diagnoses: Age-related osteoporosis without current pathological fracture - Prolia every 6 months (started in April 2019). Previously treated with Fosamax for unknown duration. DEXA 10/18/17: T-score -2.9. -Patient had 2 doses of Prolia and did not come back due to the pandemic.  I discussed with her the risk of deteriorating bone density by prolonging the dose of Prolia.  She is ready to proceed with it.  We will obtain lab work today.  She is also due to have bone density in December.  Plan: DG BONE DENSITY (DXA), Vitamin D  Vitamin D deficiency-I will check vitamin D level today.  Primary osteoarthritis of left knee-chronic pain.  Status post total right knee replacement-she has limited extension.  Primary osteoarthritis of both feet-she has discomfort in her right foot due to hammertoes.  Proper fitting shoes with orthotics was emphasized.  Trochanteric bursitis of both hips-she has chronic discomfort but is tolerable at this time.  Fibromyalgia-she continues to have some generalized pain discomfort insomnia and fatigue.  Other fatigue  Primary insomnia - Topamax 50 mg 3 tablets by mouth at bedtime.  She continues takes Zanaflex 4 mg by mouth at bedtime   Positive ANA (antinuclear antibody) -she has been experiencing increased fatigue.  She had positive ANA and positive double-stranded DNA in the past.  She has no new clinical features of autoimmune disease.  I will obtain labs today.  Plan: ESR, ANA, RNP Antibody, Anti-Smith antibody, Ro, La, Ds DNA, Scl-70, C3 and C4  Medication monitoring encounter -patient is on tramadol.  Plan: UDS, Tramadol, CBC with diff, CMP with GFR  History of depression-patient states his symptoms are controlled.  History of hyperlipidemia  History of anxiety  History of macular degeneration  History of hypothyroidism  History of hypertension Orders: Orders Placed This Encounter  Procedures  . DG BONE DENSITY (DXA)  . UDS  . Tramadol  . CBC with  diff  . CMP with GFR  . Vitamin D  . ESR  . ANA  . RNP Antibody  . Anti-Smith antibody  . Ro  . La  . Ds DNA  . Scl-70  . C3 and C4   No orders of the defined types were placed in this encounter.   Face-to-face time spent with patient  was 25 minutes. Greater than 50% of time was spent in counseling and coordination of care.  Follow-Up Instructions: Return in about 2 months (around 11/24/2019).   Bo Merino, MD  Note - This record has been created using Editor, commissioning.  Chart creation errors have been sought, but may not always  have been located. Such creation errors do not reflect on  the standard of medical care.

## 2019-09-24 ENCOUNTER — Ambulatory Visit: Payer: Medicare Other | Admitting: Rheumatology

## 2019-09-24 ENCOUNTER — Other Ambulatory Visit: Payer: Self-pay

## 2019-09-24 ENCOUNTER — Encounter: Payer: Self-pay | Admitting: Physician Assistant

## 2019-09-24 ENCOUNTER — Other Ambulatory Visit: Payer: Self-pay | Admitting: *Deleted

## 2019-09-24 VITALS — BP 134/84 | HR 95 | Resp 13 | Ht 61.0 in | Wt 170.4 lb

## 2019-09-24 DIAGNOSIS — Z8669 Personal history of other diseases of the nervous system and sense organs: Secondary | ICD-10-CM

## 2019-09-24 DIAGNOSIS — R768 Other specified abnormal immunological findings in serum: Secondary | ICD-10-CM

## 2019-09-24 DIAGNOSIS — M810 Age-related osteoporosis without current pathological fracture: Secondary | ICD-10-CM

## 2019-09-24 DIAGNOSIS — M7062 Trochanteric bursitis, left hip: Secondary | ICD-10-CM

## 2019-09-24 DIAGNOSIS — M797 Fibromyalgia: Secondary | ICD-10-CM

## 2019-09-24 DIAGNOSIS — Z8659 Personal history of other mental and behavioral disorders: Secondary | ICD-10-CM

## 2019-09-24 DIAGNOSIS — E559 Vitamin D deficiency, unspecified: Secondary | ICD-10-CM

## 2019-09-24 DIAGNOSIS — Z5181 Encounter for therapeutic drug level monitoring: Secondary | ICD-10-CM

## 2019-09-24 DIAGNOSIS — Z8679 Personal history of other diseases of the circulatory system: Secondary | ICD-10-CM

## 2019-09-24 DIAGNOSIS — R5383 Other fatigue: Secondary | ICD-10-CM

## 2019-09-24 DIAGNOSIS — M19071 Primary osteoarthritis, right ankle and foot: Secondary | ICD-10-CM

## 2019-09-24 DIAGNOSIS — F5101 Primary insomnia: Secondary | ICD-10-CM

## 2019-09-24 DIAGNOSIS — M7061 Trochanteric bursitis, right hip: Secondary | ICD-10-CM

## 2019-09-24 DIAGNOSIS — M1712 Unilateral primary osteoarthritis, left knee: Secondary | ICD-10-CM

## 2019-09-24 DIAGNOSIS — Z8639 Personal history of other endocrine, nutritional and metabolic disease: Secondary | ICD-10-CM

## 2019-09-24 DIAGNOSIS — Z96651 Presence of right artificial knee joint: Secondary | ICD-10-CM

## 2019-09-26 LAB — CBC WITH DIFFERENTIAL/PLATELET
Absolute Monocytes: 324 cells/uL (ref 200–950)
Basophils Absolute: 28 cells/uL (ref 0–200)
Basophils Relative: 0.6 %
Eosinophils Absolute: 174 cells/uL (ref 15–500)
Eosinophils Relative: 3.7 %
HCT: 39.3 % (ref 35.0–45.0)
Hemoglobin: 12.6 g/dL (ref 11.7–15.5)
Lymphs Abs: 1838 cells/uL (ref 850–3900)
MCH: 30.5 pg (ref 27.0–33.0)
MCHC: 32.1 g/dL (ref 32.0–36.0)
MCV: 95.2 fL (ref 80.0–100.0)
MPV: 11.4 fL (ref 7.5–12.5)
Monocytes Relative: 6.9 %
Neutro Abs: 2336 cells/uL (ref 1500–7800)
Neutrophils Relative %: 49.7 %
Platelets: 164 10*3/uL (ref 140–400)
RBC: 4.13 10*6/uL (ref 3.80–5.10)
RDW: 12.4 % (ref 11.0–15.0)
Total Lymphocyte: 39.1 %
WBC: 4.7 10*3/uL (ref 3.8–10.8)

## 2019-09-26 LAB — COMPLETE METABOLIC PANEL WITH GFR
AG Ratio: 1.7 (calc) (ref 1.0–2.5)
ALT: 13 U/L (ref 6–29)
AST: 15 U/L (ref 10–35)
Albumin: 3.8 g/dL (ref 3.6–5.1)
Alkaline phosphatase (APISO): 58 U/L (ref 37–153)
BUN/Creatinine Ratio: 17 (calc) (ref 6–22)
BUN: 16 mg/dL (ref 7–25)
CO2: 27 mmol/L (ref 20–32)
Calcium: 8.6 mg/dL (ref 8.6–10.4)
Chloride: 109 mmol/L (ref 98–110)
Creat: 0.94 mg/dL — ABNORMAL HIGH (ref 0.60–0.93)
GFR, Est African American: 70 mL/min/{1.73_m2} (ref >=60)
GFR, Est Non African American: 61 mL/min/{1.73_m2} (ref >=60)
Globulin: 2.2 g/dL (calc) (ref 1.9–3.7)
Glucose, Bld: 101 mg/dL — ABNORMAL HIGH (ref 65–99)
Potassium: 3.8 mmol/L (ref 3.5–5.3)
Sodium: 142 mmol/L (ref 135–146)
Total Bilirubin: 0.3 mg/dL (ref 0.2–1.2)
Total Protein: 6 g/dL — ABNORMAL LOW (ref 6.1–8.1)

## 2019-09-26 LAB — PAIN MGMT, PROFILE 5 W/CONF, U
Amphetamines: NEGATIVE ng/mL
Barbiturates: NEGATIVE ng/mL
Benzodiazepines: NEGATIVE ng/mL
Cocaine Metabolite: NEGATIVE ng/mL
Creatinine: 215.3 mg/dL
Marijuana Metabolite: NEGATIVE ng/mL
Methadone Metabolite: NEGATIVE ng/mL
Opiates: NEGATIVE ng/mL
Oxidant: NEGATIVE ug/mL
Oxycodone: NEGATIVE ng/mL
pH: 6.6 (ref 4.5–9.0)

## 2019-09-26 LAB — SEDIMENTATION RATE: Sed Rate: 6 mm/h (ref 0–30)

## 2019-09-26 LAB — ANTI-SCLERODERMA ANTIBODY: Scleroderma (Scl-70) (ENA) Antibody, IgG: <1 AI

## 2019-09-26 LAB — PAIN MGMT, TRAMADOL W/MEDMATCH, U
Desmethyltramadol: >10000 ng/mL
Tramadol: >10000 ng/mL

## 2019-09-26 LAB — SJOGRENS SYNDROME-A EXTRACTABLE NUCLEAR ANTIBODY: SSA (Ro) (ENA) Antibody, IgG: <1 AI

## 2019-09-26 LAB — RNP ANTIBODY: Ribonucleic Protein(ENA) Antibody, IgG: <1 AI

## 2019-09-26 LAB — VITAMIN D 25 HYDROXY (VIT D DEFICIENCY, FRACTURES): Vit D, 25-Hydroxy: 35 ng/mL (ref 30–100)

## 2019-09-26 LAB — C3 AND C4
C3 Complement: 117 mg/dL (ref 83–193)
C4 Complement: 26 mg/dL (ref 15–57)

## 2019-09-26 LAB — SJOGRENS SYNDROME-B EXTRACTABLE NUCLEAR ANTIBODY: SSB (La) (ENA) Antibody, IgG: <1 AI

## 2019-09-26 LAB — ANTI-SMITH ANTIBODY: ENA SM Ab Ser-aCnc: <1 AI

## 2019-09-26 LAB — ANA: Anti Nuclear Antibody (ANA): NEGATIVE

## 2019-09-26 LAB — ANTI-DNA ANTIBODY, DOUBLE-STRANDED: ds DNA Ab: 59 IU/mL — ABNORMAL HIGH

## 2019-09-28 NOTE — Progress Notes (Signed)
Her double-stranded DNA is a still positive as before.  The complements and ESR are normal.  It does not indicate active disease.  If patient develops any new symptoms she should contact us.

## 2019-10-06 ENCOUNTER — Other Ambulatory Visit: Payer: Self-pay | Admitting: Rheumatology

## 2019-10-07 NOTE — Telephone Encounter (Signed)
Last Visit: 09/24/2019 Next Visit: 12/01/2019 UDS: 09/24/2019  Narc Agreement: 09/24/2019? Not scanned in yet.   Last fill: 09/10/2019 tramadol 04/27/2019 xanax (30 tablets)   Okay to refill tramadol and xanax?

## 2019-10-12 ENCOUNTER — Other Ambulatory Visit: Payer: Self-pay

## 2019-10-12 ENCOUNTER — Ambulatory Visit (HOSPITAL_COMMUNITY)
Admission: RE | Admit: 2019-10-12 | Discharge: 2019-10-12 | Disposition: A | Discharge status: Home / Self Care | Payer: Medicare Other | Source: Ambulatory Visit | Attending: Rheumatology | Admitting: Rheumatology

## 2019-10-12 DIAGNOSIS — M810 Age-related osteoporosis without current pathological fracture: Secondary | ICD-10-CM | POA: Insufficient documentation

## 2019-10-12 MED ORDER — DENOSUMAB 60 MG/ML ~~LOC~~ SOSY
60.0000 mg | PREFILLED_SYRINGE | Freq: Once | SUBCUTANEOUS | Status: AC
  Administered 2019-10-12: 60 mg via SUBCUTANEOUS

## 2019-10-12 MED ORDER — DENOSUMAB 60 MG/ML ~~LOC~~ SOSY
PREFILLED_SYRINGE | SUBCUTANEOUS | Status: AC
  Filled 2019-10-12: qty 1

## 2019-10-22 ENCOUNTER — Telehealth: Payer: Self-pay

## 2019-10-22 NOTE — Telephone Encounter (Signed)
DEXA scan  10/20/2019  T-score: -2.9 BMD: 0.525  Dr. Estanislado Pandy reviewed and DEXA is stable,continue treatment.   Attempted to contact patient and left message on machine to advise.

## 2019-11-25 NOTE — Progress Notes (Signed)
Office Visit Note  Patient: Paula Murphy             Date of Birth: 25-Apr-1946           MRN: [id number]             PCP: Arlyss Repress, MD Referring: Arlyss Repress, MD Visit Date: 12/03/2019 Occupation: @GUAROCC @  Subjective:  Trochanteric bursitis bilaterally   History of Present Illness: Paula Murphy is a 74 y.o. female with history of osteoporosis, fibromyalgia, and DDD. She presents today with trochanteric bursitis bilaterally.  She has been going to physical therapy, which has been helping but she would like cortisone injections today.  She states she has been having thoracic spinal pain and uses a heating pad on a daily basis but the discomfort has been worsening.  She would like to be referred to the lymphedema clinic for bilateral lower extremity edema.   Activities of Daily Living:  Patient reports joint stiffness all day  Patient Reports nocturnal pain.  Difficulty dressing/grooming: Denies Difficulty climbing stairs: Reports Difficulty getting out of chair: Denies Difficulty using hands for taps, buttons, cutlery, and/or writing: Reports  Review of Systems  Constitutional: Positive for fatigue.  HENT: Positive for mouth dryness. Negative for mouth sores and nose dryness.   Eyes: Negative for itching and dryness.  Respiratory: Negative for shortness of breath, wheezing and difficulty breathing.   Cardiovascular: Negative for chest pain and palpitations.  Gastrointestinal: Negative for blood in stool, constipation and diarrhea.  Endocrine: Negative for increased urination.  Genitourinary: Negative for difficulty urinating and painful urination.  Musculoskeletal: Positive for arthralgias, joint pain, muscle weakness, morning stiffness and muscle tenderness. Negative for joint swelling.  Skin: Negative for rash and hair loss.  Allergic/Immunologic: Negative for susceptible to infections.  Neurological: Positive for headaches and weakness. Negative for dizziness,  numbness and memory loss.  Hematological: Negative for bruising/bleeding tendency.  Psychiatric/Behavioral: Negative for confusion and sleep disturbance.    PMFS History:  Patient Active Problem List   Diagnosis Date Noted  . Ingrown toenail 02/02/2019  . Primary osteoarthritis of both feet 03/21/2017  . History of macular degeneration 03/21/2017  . Other fatigue 03/21/2017  . Age-related osteoporosis without current pathological fracture 03/21/2017  . Esophageal reflux 06/29/2013  . Actinic keratosis 04/11/2013  . Systemic lupus erythematosus ([place]) 04/11/2013  . OA (osteoarthritis) of knee 03/02/2013  . DEPRESSION 07/24/2010  . Major depressive disorder, single episode, unspecified 07/24/2010  . UTI 07/04/2010  . ABDOMINAL BLOATING 01/20/2010  . Fibromyalgia 10/04/2009  . INSOMNIA, CHRONIC 10/04/2009  . Autoimmune disease ([place]) 10/04/2009  . Vitamin D deficiency 10/26/2008  . HYPOTHYROIDISM 08/13/2008  . HYPERLIPIDEMIA 08/13/2008  . HYPERTENSION 08/13/2008  . HEADACHE 08/13/2008  . Muscle pain 08/12/2008    Past Medical History:  Diagnosis Date  . Arthritis    OA AND PAIN RT KNEE  . Autoimmune disease ([place])    + ANA, and DS DNA--PT STATES SHE WAS TOLD SHE DOES NOT HAVE LUPUS AS PREVIOUSLY THOUGHT  . Fibromyalgia    followed by Dr. Estanislado Pandy  . Hemorrhoids   . Hyperlipidemia   . Hypertension    PAST HX OF HYPERTENSION - TOOK MEDICATION--BUT OFF MEDICATION FOR YEARS-NO LONGER A PROBLEM  . Hypothyroidism   . Macular degeneration    BOTH EYES  . PONV (postoperative nausea and vomiting)   . Thyroid disease    Hypothyroidism    Family History  Problem Relation Age of Onset  . Stroke Mother   .  Hypertension Mother   . Hyperlipidemia Mother   . Cancer Mother        Lung  . Alzheimer's disease Mother   . Stroke Father   . Hypertension Father   . Diabetes Father        Type II  . Hyperlipidemia Father   . Cancer Father        Lung  . Arthritis Father         Rheumatoid  . Thyroid disease Daughter    Past Surgical History:  Procedure Laterality Date  . ABDOMINAL HYSTERECTOMY  1989  . BUNIONECTOMY Left 2012  . CHOLECYSTECTOMY  1996  . EYE SURGERY     BILATERAL CATARACT EXTRACTION  . KNEE ARTHROSCOPY  1994   Right Knee  . KNEE SURGERY    . NECK SURGERY  2003  . TONSILLECTOMY  1953  . TOTAL KNEE ARTHROPLASTY Right 03/02/2013   Procedure: RIGHT TOTAL KNEE ARTHROPLASTY;  Surgeon: Gearlean Alf, MD;  Location: [facility];  Service: Orthopedics;  Laterality: Right;   Social History   Social History Narrative   Retired   Married   1 daughter/1 grandson   Never Smoked   Alcohol use - yes (social)   Immunization History  Administered Date(s) Administered  . Influenza Whole 08/12/2008, 08/30/2009, 08/23/2010  . Pneumococcal Polysaccharide-23 03/29/2009  . Td 07/22/2007     Objective: Vital Signs: BP 127/75 (BP Location: Left Arm, Patient Position: Sitting, Cuff Size: Normal)   Pulse 75   Resp 14   Ht 5' 0.5" (1.537 m)   Wt 165 lb 3.2 oz (74.9 kg)   BMI 31.73 kg/m    Physical Exam Vitals and nursing note reviewed.  Constitutional:      Appearance: She is well-developed.  HENT:     Head: Normocephalic and atraumatic.  Eyes:     Conjunctiva/sclera: Conjunctivae normal.  Cardiovascular:     Rate and Rhythm: Normal rate and regular rhythm.     Heart sounds: Normal heart sounds.  Pulmonary:     Effort: Pulmonary effort is normal.     Breath sounds: Normal breath sounds.  Abdominal:     General: Bowel sounds are normal.     Palpations: Abdomen is soft.  Musculoskeletal:     Cervical back: Normal range of motion.  Lymphadenopathy:     Cervical: No cervical adenopathy.  Skin:    General: Skin is warm and dry.     Capillary Refill: Capillary refill takes less than 2 seconds.  Neurological:     Mental Status: She is alert and oriented to person, place, and time.  Psychiatric:        Behavior: Behavior normal.       Musculoskeletal Exam: C-spine good ROM.  Thoracic kyphosis.  Right trapezius muscle tension and tenderness.  Shoulder joints, elbow joints, wrist joints, MCPs, PIPs, and DIPs good ROM with no synovitis.  Complete fist formation bilaterally.  Hip joints, knee joints, ankle joints, MCPs, PIPs, and DIPs good ROM with no synovitis.  No warmth or effusion of knee joints.  Tenderness over trochanteric bursa bilaterally.  Pedal edema noted bilaterally.    CDAI Exam: CDAI Score: -- Patient Global: --; Provider Global: -- Swollen: --; Tender: -- Joint Exam 12/03/2019   No joint exam has been documented for this visit   There is currently no information documented on the homunculus. Go to the Rheumatology activity and complete the homunculus joint exam.  Investigation: No additional findings.  Imaging: No results found.  Recent Labs: Lab Results  Component Value Date   WBC 4.7 09/24/2019   HGB 12.6 09/24/2019   PLT 164 09/24/2019   NA 142 09/24/2019   K 3.8 09/24/2019   CL 109 09/24/2019   CO2 27 09/24/2019   GLUCOSE 101 (H) 09/24/2019   BUN 16 09/24/2019   CREATININE 0.94 (H) 09/24/2019   BILITOT 0.3 09/24/2019   ALKPHOS 77 02/24/2013   AST 15 09/24/2019   ALT 13 09/24/2019   PROT 6.0 (L) 09/24/2019   ALBUMIN 3.5 02/24/2013   CALCIUM 8.6 09/24/2019   GFRAA 70 09/24/2019    Speciality Comments: Osteoporosis therapy: Prolia started in April 2019  Procedures:  Large Joint Inj: bilateral greater trochanter on 12/03/2019 2:29 PM Indications: pain Details: 27 G 1.5 in needle, lateral approach  Arthrogram: No  Medications (Right): 1.5 mL lidocaine 1 %; 40 mg triamcinolone acetonide 40 MG/ML Aspirate (Right): 0 mL Medications (Left): 1.5 mL lidocaine 1 %; 40 mg triamcinolone acetonide 40 MG/ML Aspirate (Left): 0 mL Outcome: tolerated well, no immediate complications Procedure, treatment alternatives, risks and benefits explained, specific risks discussed. Consent was given by  the patient. Immediately prior to procedure a time out was called to verify the correct patient, procedure, equipment, support staff and site/side marked as required. Patient was prepped and draped in the usual sterile fashion.     Allergies: Codeine and Hydrocodone   Assessment / Plan:     Visit Diagnoses: Age-related osteoporosis without current pathological fracture -DEXA on 10/18/2017 right femoral neck BMD 0.527 with T score -2.9.  She had a -5% change in BMD in the right femoral neck and -7% change in the left femoral neck.  Her most recent DEXA was performed on 10/22/19 BMD 0.525 with T-score -2.9.  She was previously taking Fosamax 70 mg 1 tablet by mouth once weekly since April 2017 but was then started on Prolia in April 2019.  She had a gap in therapy but her most recent injection was in December 2020. She continues to take a calcium supplement.   She will follow up in 6 months.   Vitamin D deficiency: Vitamin D was 35 on 09/24/2019.  She is taking a vitamin D supplement.  Primary osteoarthritis of left knee: She has good range of motion with no discomfort.  No warmth or effusion was noted.  Status post total right knee replacement: Doing well.  She has good range of motion with no discomfort.  Primary osteoarthritis of both feet: She is not having any pain in her feet at this time.  She has been noticing worsening edema related to lymphedema.  She requested a referral to lymphedema clinic.  Trochanteric bursitis of both hips: She presents today with trochanter bursitis bilaterally.  She has significant tenderness to palpation.  She requested bilateral cortisone injections.  She tolerated procedure well.  The procedure notes were completed above.  She has been going to physical therapy which has been helping her discomfort.  Medication monitoring encounter - Tramadol 50 mg 1 to 2 tablets twice daily as needed for pain relief UDS: 11/19/2020Narc agreement: 09/24/2019  Fibromyalgia: She  has generalized muscle aches and muscle tenderness due to fibromyalgia.  She continues have chronic fatigue related to insomnia.  She experiences nocturnal pain at night.  She presents today with trochanter bursitis bilaterally.  She requested trochanter bursa cortisone injections today.  She has been going to physical therapy which has been improving her symptoms.  She continues to have trapezius muscle tension and  muscle tenderness bilaterally.  She is having muscle spasms on the right side today.  She is advised to notify us if she would like to return for trigger point injections in the future.  We discussed the importance of working on her posture and possibly getting a stand-up desk to use at work.  Other fatigue: She has chronic fatigue related to insomnia.   Primary insomnia - She is taking Topamax 50 mg 3 tablets by mouth at bedtime.  She continues takes Zanaflex 4 mg by mouth at bedtime   Positive ANA (antinuclear antibody) -ANA negative, double-stranded DNA 59, SCL 70 -, Ro negative, La negative, Smith negative, RNP negative, sed rate 6, complements within normal limits on 09/24/2019.  She has no clinical features of autoimmune disease at this time.  Lymphedema: She requested a referral to the lymphedema clinic at [facility].  A referral will be placed today.  Other medical conditions are listed as follows:  History of hyperlipidemia  History of depression  History of macular degeneration  History of hypertension  History of hypothyroidism  History of anxiety  Orders: Orders Placed This Encounter  Procedures  . Large Joint Inj   No orders of the defined types were placed in this encounter.   Face-to-face time spent with patient was 30 minutes. Greater than 50% of time was spent in counseling and coordination of care.  Follow-Up Instructions: Return in about 6 months (around 06/01/2020) for Osteoporosis, Osteoarthritis, Fibromyalgia.   Ofilia Neas, PA-C   I examined and  evaluated the patient with Hazel Sams PA.  Patient had light trapezius spasm on my examination.  She also had tenderness over bilateral trochanteric bursa.  Per her request bilateral trochanteric bursae were injected with cortisone as described above.  The plan of care was discussed as noted above.  Bo Merino, MD  Note - This record has been created using Editor, commissioning.  Chart creation errors have been sought, but may not always  have been located. Such creation errors do not reflect on  the standard of medical care.

## 2019-12-01 ENCOUNTER — Ambulatory Visit: Payer: Medicare Other | Admitting: Rheumatology

## 2019-12-03 ENCOUNTER — Encounter: Payer: Self-pay | Admitting: Rheumatology

## 2019-12-03 ENCOUNTER — Other Ambulatory Visit: Payer: Self-pay

## 2019-12-03 ENCOUNTER — Telehealth: Payer: Self-pay | Admitting: Pharmacist

## 2019-12-03 ENCOUNTER — Ambulatory Visit: Payer: Medicare Other | Admitting: Rheumatology

## 2019-12-03 VITALS — BP 127/75 | HR 75 | Resp 14 | Ht 60.5 in | Wt 165.2 lb

## 2019-12-03 DIAGNOSIS — R5383 Other fatigue: Secondary | ICD-10-CM

## 2019-12-03 DIAGNOSIS — Z8679 Personal history of other diseases of the circulatory system: Secondary | ICD-10-CM

## 2019-12-03 DIAGNOSIS — M1712 Unilateral primary osteoarthritis, left knee: Secondary | ICD-10-CM | POA: Diagnosis not present

## 2019-12-03 DIAGNOSIS — R768 Other specified abnormal immunological findings in serum: Secondary | ICD-10-CM

## 2019-12-03 DIAGNOSIS — M810 Age-related osteoporosis without current pathological fracture: Secondary | ICD-10-CM

## 2019-12-03 DIAGNOSIS — M7061 Trochanteric bursitis, right hip: Secondary | ICD-10-CM

## 2019-12-03 DIAGNOSIS — Z8659 Personal history of other mental and behavioral disorders: Secondary | ICD-10-CM

## 2019-12-03 DIAGNOSIS — F5101 Primary insomnia: Secondary | ICD-10-CM

## 2019-12-03 DIAGNOSIS — E559 Vitamin D deficiency, unspecified: Secondary | ICD-10-CM | POA: Diagnosis not present

## 2019-12-03 DIAGNOSIS — M19072 Primary osteoarthritis, left ankle and foot: Secondary | ICD-10-CM

## 2019-12-03 DIAGNOSIS — M7062 Trochanteric bursitis, left hip: Secondary | ICD-10-CM | POA: Diagnosis not present

## 2019-12-03 DIAGNOSIS — Z5181 Encounter for therapeutic drug level monitoring: Secondary | ICD-10-CM

## 2019-12-03 DIAGNOSIS — M19071 Primary osteoarthritis, right ankle and foot: Secondary | ICD-10-CM

## 2019-12-03 DIAGNOSIS — Z96651 Presence of right artificial knee joint: Secondary | ICD-10-CM | POA: Diagnosis not present

## 2019-12-03 DIAGNOSIS — I890 Lymphedema, not elsewhere classified: Secondary | ICD-10-CM

## 2019-12-03 DIAGNOSIS — Z8669 Personal history of other diseases of the nervous system and sense organs: Secondary | ICD-10-CM

## 2019-12-03 DIAGNOSIS — Z8639 Personal history of other endocrine, nutritional and metabolic disease: Secondary | ICD-10-CM

## 2019-12-03 DIAGNOSIS — M797 Fibromyalgia: Secondary | ICD-10-CM

## 2019-12-03 MED ORDER — TRIAMCINOLONE ACETONIDE 40 MG/ML IJ SUSP
40.0000 mg | INTRAMUSCULAR | Status: AC | PRN
  Administered 2019-12-03: 40 mg via INTRA_ARTICULAR

## 2019-12-03 MED ORDER — LIDOCAINE HCL 1 % IJ SOLN
1.5000 mL | INTRAMUSCULAR | Status: AC | PRN
  Administered 2019-12-03: 1.5 mL

## 2019-12-03 NOTE — Telephone Encounter (Signed)
Attempted PA for Prolia, but received this response from insurance: This medication or product is on your plan's list of covered drugs. Prior authorization is not required at this time. [facility] pharmacy help desk at [phone].  PM:[id number]  Ran test claim: Patient's copay for Prolia is $395.00. Process patient's [place] information and patient was Approved  for [employer] [place] osteoporosis grant for copay assistance totaling $1,000- Approval dates are 11/03/19 to 11/01/20.  Pharmacy Card [id number] [phone] [id number]  4:10 PM Beatriz Chancellor, CPhT

## 2019-12-03 NOTE — Telephone Encounter (Signed)
Patient received her Prolia injection at [facility] short stay in December.  Patient states the price has dropped from [phone].  Patient states she is unable to continue to afford.  She has been receiving Prolia through her medical benefit as we have not been successful in gaining approval through the pharmacy benefit in the past.  Advised that we will check into filling Prolia through the pharmacy benefit.  Also advised that we can look into grants to help with co-pay.  Patient annual gross income is 39,000 a year for a household of 1.  Please start BIV for Prolia.   Mariella Saa, PharmD, [place], [place] Clinical Specialty Pharmacist [phone]  12/03/2019 2:51 PM

## 2019-12-08 ENCOUNTER — Other Ambulatory Visit: Payer: Self-pay | Admitting: Rheumatology

## 2019-12-08 NOTE — Telephone Encounter (Signed)
Last Visit: 11/26/19 Next visit: 06/01/20  Okay to refill per Dr. Estanislado Pandy

## 2019-12-08 NOTE — Telephone Encounter (Signed)
Called to notify patient but no answer.  Left message notifying patient that she was approved for grant and she would be able to receive the medication in the office in the future.  She is not due for her next Prolia injection until June and we will touch base prior to discuss proceeding scheduling Prolia.  Mariella Saa, PharmD, [place], [place] Clinical Specialty Pharmacist [phone]  12/08/2019 1:55 PM

## 2020-01-18 ENCOUNTER — Other Ambulatory Visit: Payer: Self-pay | Admitting: Rheumatology

## 2020-01-18 ENCOUNTER — Telehealth: Payer: Self-pay | Admitting: Rheumatology

## 2020-01-18 MED ORDER — TRAMADOL HCL 50 MG PO TABS: ORAL_TABLET | ORAL | 0 refills | Status: DC

## 2020-01-18 MED ORDER — ALPRAZOLAM 0.5 MG PO TABS: 0.5000 mg | ORAL_TABLET | Freq: Every evening | ORAL | 0 refills | Status: DC | PRN

## 2020-01-18 NOTE — Telephone Encounter (Signed)
Last Visit: 11/26/19 Next visit: 06/01/20  Okay to refill per Dr. Estanislado Pandy

## 2020-01-18 NOTE — Telephone Encounter (Signed)
Last Visit: 11/26/19 Next visit: 06/01/20 UDS: 09/24/19 Narc Agreement: 09/24/19  Last Fill: 10/07/19  Okay to refill Tramadol and Xanax?

## 2020-01-18 NOTE — Telephone Encounter (Signed)
Patient called requesting prescription refills of Alprazolam and Tramadol to be sent to [facility].

## 2020-01-26 ENCOUNTER — Telehealth: Payer: Self-pay | Admitting: *Deleted

## 2020-01-26 NOTE — Telephone Encounter (Signed)
Received retrospective drug utilization review from [employer]. Reviewed by Hazel Sams, PA-C  Patient receiving prescriptions for Tramadol, Escitalopram and Alprazolam.   Per Hazel Sams, PA-C: Please advise patient to only take Tramadol as needed due to risk of Serotonin Syndrome.    Attempted to contact the patient and left message for patient to call the office.

## 2020-01-26 NOTE — Telephone Encounter (Signed)
Patient advised of recommendations and verbalized understanding.  

## 2020-03-05 ENCOUNTER — Other Ambulatory Visit: Payer: Self-pay | Admitting: Physician Assistant

## 2020-03-07 NOTE — Telephone Encounter (Signed)
Last Visit: 12/03/2019 Next Visit: 06/01/2020 UDS: 09/24/2019  Narc Agreement: 09/24/2019   Last fill: 01/18/2020   Okay to refill tramadol?

## 2020-03-15 ENCOUNTER — Telehealth: Payer: Self-pay | Admitting: Rheumatology

## 2020-03-15 DIAGNOSIS — G8929 Other chronic pain: Secondary | ICD-10-CM

## 2020-03-15 DIAGNOSIS — Z5181 Encounter for therapeutic drug level monitoring: Secondary | ICD-10-CM

## 2020-03-15 NOTE — Telephone Encounter (Signed)
Patient advised she is due to update UDS and narcotic agreement this month. Patient states she is also due for Prolia in June. Patient states Amber advised her to contact the office 1 month prior as she qualifies for the grant. Patient is requesting a call back from [employer].

## 2020-03-15 NOTE — Telephone Encounter (Signed)
Patient called stating she was returning a call to the office.  Patient states it could be regarding her Prolia injection which is due in June.  Patient requested a return call.

## 2020-03-17 ENCOUNTER — Other Ambulatory Visit: Payer: Self-pay

## 2020-03-17 DIAGNOSIS — Z5181 Encounter for therapeutic drug level monitoring: Secondary | ICD-10-CM

## 2020-03-17 DIAGNOSIS — G8929 Other chronic pain: Secondary | ICD-10-CM

## 2020-03-19 LAB — DM TEMPLATE

## 2020-03-19 LAB — DRUG MONITOR, PANEL 5, W/CONF, URINE
Amphetamines: NEGATIVE ng/mL (ref <500)
Barbiturates: NEGATIVE ng/mL (ref <300)
Benzodiazepines: NEGATIVE ng/mL (ref <100)
Cocaine Metabolite: NEGATIVE ng/mL (ref <150)
Creatinine: 195.6 mg/dL
Marijuana Metabolite: NEGATIVE ng/mL (ref <20)
Methadone Metabolite: NEGATIVE ng/mL (ref <100)
Opiates: NEGATIVE ng/mL (ref <100)
Oxidant: NEGATIVE ug/mL
Oxycodone: NEGATIVE ng/mL (ref <100)
pH: 7.1 (ref 4.5–9.0)

## 2020-03-19 LAB — DRUG MONITOR, TRAMADOL,QN, URINE
Desmethyltramadol: 2855 ng/mL — ABNORMAL HIGH (ref <100)
Tramadol: 1602 ng/mL — ABNORMAL HIGH (ref <100)

## 2020-03-20 ENCOUNTER — Other Ambulatory Visit: Payer: Self-pay | Admitting: Rheumatology

## 2020-03-20 NOTE — Progress Notes (Signed)
UDS is consistent with tramadol use.

## 2020-03-21 NOTE — Telephone Encounter (Signed)
Last Visit: 12/03/2019 Next Visit: 06/01/2020  Okay to refill per Dr. Estanislado Pandy

## 2020-03-21 NOTE — Telephone Encounter (Signed)
Patient's grant is still active. Approve through 11/01/20

## 2020-03-22 NOTE — Telephone Encounter (Signed)
Returned patient call.  Advised that we were able to get patient approved for grant ~11/01/2020.  She is due for her next injection on or after June 7.  Instructed patient to come for labs next week and prescription will be sent to pharmacy pending results.  Pharmacy will send prescription to the office and patient was scheduled an appointment for administration on 04/13/2020 at 10 AM.  All questions encouraged and answered.  Instructed patient to call with any further questions or concerns.  Mariella Saa, PharmD, [place], [place] Clinical Specialty Pharmacist [phone]  03/22/2020 10:50 AM

## 2020-03-22 NOTE — Addendum Note (Signed)
Addended by: Mariella Saa C on: 03/22/2020 10:53 AM   Modules accepted: Orders

## 2020-03-30 ENCOUNTER — Other Ambulatory Visit: Payer: Self-pay | Admitting: *Deleted

## 2020-03-30 DIAGNOSIS — M810 Age-related osteoporosis without current pathological fracture: Secondary | ICD-10-CM

## 2020-03-30 DIAGNOSIS — Z5181 Encounter for therapeutic drug level monitoring: Secondary | ICD-10-CM

## 2020-03-31 LAB — CBC
HCT: 38.4 % (ref 35.0–45.0)
Hemoglobin: 12.1 g/dL (ref 11.7–15.5)
MCH: 30.9 pg (ref 27.0–33.0)
MCHC: 31.5 g/dL — ABNORMAL LOW (ref 32.0–36.0)
MCV: 98.2 fL (ref 80.0–100.0)
MPV: 11.2 fL (ref 7.5–12.5)
Platelets: 226 10*3/uL (ref 140–400)
RBC: 3.91 10*6/uL (ref 3.80–5.10)
RDW: 12.3 % (ref 11.0–15.0)
WBC: 5 10*3/uL (ref 3.8–10.8)

## 2020-03-31 LAB — COMPLETE METABOLIC PANEL WITH GFR
AG Ratio: 1.6 (calc) (ref 1.0–2.5)
ALT: 12 U/L (ref 6–29)
AST: 15 U/L (ref 10–35)
Albumin: 3.5 g/dL — ABNORMAL LOW (ref 3.6–5.1)
Alkaline phosphatase (APISO): 59 U/L (ref 37–153)
BUN/Creatinine Ratio: 20 (calc) (ref 6–22)
BUN: 19 mg/dL (ref 7–25)
CO2: 24 mmol/L (ref 20–32)
Calcium: 8.3 mg/dL — ABNORMAL LOW (ref 8.6–10.4)
Chloride: 111 mmol/L — ABNORMAL HIGH (ref 98–110)
Creat: 0.94 mg/dL — ABNORMAL HIGH (ref 0.60–0.93)
GFR, Est African American: 70 mL/min/{1.73_m2} (ref >=60)
GFR, Est Non African American: 60 mL/min/{1.73_m2} (ref >=60)
Globulin: 2.2 g/dL (calc) (ref 1.9–3.7)
Glucose, Bld: 126 mg/dL — ABNORMAL HIGH (ref 65–99)
Potassium: 4.1 mmol/L (ref 3.5–5.3)
Sodium: 140 mmol/L (ref 135–146)
Total Bilirubin: 0.3 mg/dL (ref 0.2–1.2)
Total Protein: 5.7 g/dL — ABNORMAL LOW (ref 6.1–8.1)

## 2020-03-31 NOTE — Progress Notes (Signed)
CBC within normal limits.  CMP showed low creatinine, calcium, and albumin.  Patient corrected calcium level 8.7.  She is due for Prolia inject.  Okay to proceed with Prolia?

## 2020-03-31 NOTE — Progress Notes (Signed)
Yes, okay to proceed with Prolia.

## 2020-04-01 MED ORDER — DENOSUMAB 60 MG/ML ~~LOC~~ SOSY: 60.0000 mg | PREFILLED_SYRINGE | Freq: Once | SUBCUTANEOUS | 0 refills | Status: DC

## 2020-04-01 NOTE — Progress Notes (Signed)
Patient notified and prescription sent to [facility] to courier to clinic.   Mariella Saa, PharmD, [place], [facility] Clinical Specialty Pharmacist (Rheumatology and Pulmonology)  04/01/2020 8:39 AM

## 2020-04-05 ENCOUNTER — Telehealth: Payer: Self-pay | Admitting: Rheumatology

## 2020-04-05 NOTE — Telephone Encounter (Signed)
Patient called stating she called [facility] to refill her prescriptions of Tramadol and Alprazolam and was told there are no refills remaining and needed to contact Dr. Estanislado Pandy.  Patient states she is out of Alprazolam and requesting the refills be sent ASAP.

## 2020-04-05 NOTE — Telephone Encounter (Signed)
Last Visit: 12/03/2019 Next Visit: 04/13/2020 UDS:03/17/2020 Narc Agreement: 03/17/2020  Last Fill: Tramadol 03/07/2020, Xanax 01/18/2020  Okay to refill Tramadol and Xanax?

## 2020-04-06 MED ORDER — ALPRAZOLAM 0.5 MG PO TABS: 0.5000 mg | ORAL_TABLET | Freq: Every evening | ORAL | 0 refills | Status: DC | PRN

## 2020-04-06 MED ORDER — TRAMADOL HCL 50 MG PO TABS: ORAL_TABLET | ORAL | 0 refills | Status: DC

## 2020-04-12 NOTE — Progress Notes (Signed)
Pharmacy Note  Subjective:   Patient presents to clinic today to receive biannual dose of Prolia.  She is taking vitamin D 5,000 units daily and eats 2-3 servings of dairy daily.  Patient running a fever or have signs/symptoms of infection? No  Patient currently on antibiotics for the treatment of infection? No  Objective: CMP     Component Value Date/Time   NA 140 03/30/2020 1141   K 4.1 03/30/2020 1141   CL 111 (H) 03/30/2020 1141   CO2 24 03/30/2020 1141   GLUCOSE 126 (H) 03/30/2020 1141   BUN 19 03/30/2020 1141   CREATININE 0.94 (H) 03/30/2020 1141   CALCIUM 8.3 (L) 03/30/2020 1141   PROT 5.7 (L) 03/30/2020 1141   ALBUMIN 3.5 02/24/2013 1215   AST 15 03/30/2020 1141   ALT 12 03/30/2020 1141   ALKPHOS 77 02/24/2013 1215   BILITOT 0.3 03/30/2020 1141   GFRNONAA 60 03/30/2020 1141   GFRAA 70 03/30/2020 1141    CBC    Component Value Date/Time   WBC 5.0 03/30/2020 1141   RBC 3.91 03/30/2020 1141   HGB 12.1 03/30/2020 1141   HCT 38.4 03/30/2020 1141   PLT 226 03/30/2020 1141   MCV 98.2 03/30/2020 1141   MCH 30.9 03/30/2020 1141   MCHC 31.5 (L) 03/30/2020 1141   RDW 12.3 03/30/2020 1141   LYMPHSABS 1,838 09/24/2019 1010   MONOABS 0.3 09/27/2009 2128   EOSABS 174 09/24/2019 1010   BASOSABS 28 09/24/2019 1010    Assessment/Plan:   Administrations This Visit    denosumab (PROLIA) injection 60 mg    Admin Date 04/13/2020 Action Given Dose 60 mg Route Subcutaneous Administered By Deboraha Sprang, RPH-CPP         Patient tolerated well without issue.    Patient is to return in 10-14 days for labs to monitor for hypocalcemia.    All questions encouraged and answered.  Instructed patient to call with any further questions or concerns.  Mariella Saa, PharmD, [facility] Rheumatology Clinical Pharmacist  04/12/2020 5:04 PM

## 2020-04-13 ENCOUNTER — Other Ambulatory Visit: Payer: Self-pay

## 2020-04-13 ENCOUNTER — Ambulatory Visit (INDEPENDENT_AMBULATORY_CARE_PROVIDER_SITE_OTHER): Payer: Medicare Other | Admitting: Pharmacist

## 2020-04-13 VITALS — BP 108/67

## 2020-04-13 DIAGNOSIS — M810 Age-related osteoporosis without current pathological fracture: Secondary | ICD-10-CM | POA: Diagnosis not present

## 2020-04-13 MED ORDER — DENOSUMAB 60 MG/ML ~~LOC~~ SOSY
60.0000 mg | PREFILLED_SYRINGE | Freq: Once | SUBCUTANEOUS | Status: AC
  Administered 2020-04-13: 60 mg via SUBCUTANEOUS
  Filled 2020-04-13: qty 1

## 2020-04-19 ENCOUNTER — Other Ambulatory Visit: Payer: Self-pay | Admitting: Internal Medicine

## 2020-04-19 DIAGNOSIS — Z1231 Encounter for screening mammogram for malignant neoplasm of breast: Secondary | ICD-10-CM

## 2020-05-03 ENCOUNTER — Telehealth: Payer: Self-pay | Admitting: *Deleted

## 2020-05-03 NOTE — Telephone Encounter (Signed)
Received a Drug-Drug interaction from [employer] regarding Escitalopram and Tramadol.  Reviewed by Hazel Sams, PA-C  Recommendation: Please advise the patient to take Tramadol as needed and to avoid taking it concurrently with Escitalopram.

## 2020-05-06 NOTE — Telephone Encounter (Signed)
She can continue on the current regimen if she continues to find it to be effective and is taking the reduced dose of tramadol.  Patient advised to monitor for side effects.

## 2020-05-06 NOTE — Telephone Encounter (Signed)
Patient advised  to take Tramadol as needed and to avoid taking it concurrently with Escitalopram. Patient states she needs the tramadol everyday and states she is only taking the Tramadol 2 daily. Patient states she rarely take 3 or 4 times daily. Patient states she is taking the Escitalopram daily. Patient would like to know if she should change the  Escitalopram to something different. Patient states she has not ever had any trouble or side effects.

## 2020-05-06 NOTE — Telephone Encounter (Signed)
Patient left a message returning your call 

## 2020-05-06 NOTE — Telephone Encounter (Signed)
Attempted to contact the patient and left message for patient to call the office.  

## 2020-05-06 NOTE — Telephone Encounter (Signed)
She can continue on the current regimen if she continues to find it to be effective and is taking the reduced dose of tramadol.  Please advise the patient to monitor for side effects.

## 2020-05-19 NOTE — Progress Notes (Signed)
Office Visit Note  Patient: Paula Murphy             Date of Birth: August 31, 1946           MRN: [id number]             PCP: Arlyss Repress, MD Referring: Arlyss Repress, MD Visit Date: 06/01/2020 Occupation: @GUAROCC @  Subjective:  Other (bilateral thumb pain )   History of Present Illness: Paula Murphy is a 74 y.o. female with history of osteoarthritis, fibromyalgia and osteoporosis.  She states she has been having pain and discomfort in her bilateral CMC joints.  She had right total knee replacement in the past.  She has been having pain and discomfort in her left knee joint.  She had recent cortisone injection by Dr. Anne Fu PA.  She had no benefit so far.  She will be getting Visco supplement injections.  She continues to have a lot of pain and discomfort from osteoarthritis and fibromyalgia.  She states her pain level on the scale of 0-10 is about a 10 with tramadol it comes down to about 3-4.  She has been taking Xanax and muscle relaxers only occasionally.  She does not wish to take them now.  She has been taking Topamax at bedtime which helps her with the pain relief.  She states she missed a dose one time and it caused increased pain.  She will try to taper it again.  She is on Prolia injections and has been tolerating them well.  She is also taking calcium in diet and vitamin D.  Activities of Daily Living:  Patient reports morning stiffness for  0 minutes.   Patient Reports nocturnal pain.  Difficulty dressing/grooming: Denies Difficulty climbing stairs: Denies Difficulty getting out of chair: Denies Difficulty using hands for taps, buttons, cutlery, and/or writing: Reports  Review of Systems  Constitutional: Positive for fatigue.  HENT: Negative for mouth sores, mouth dryness and nose dryness.   Eyes: Negative for itching and dryness.  Respiratory: Negative for shortness of breath and difficulty breathing.   Cardiovascular: Negative for chest pain and palpitations.    Gastrointestinal: Negative for blood in stool, constipation and diarrhea.  Endocrine: Negative for increased urination.  Genitourinary: Negative for difficulty urinating.  Musculoskeletal: Positive for arthralgias, joint pain, myalgias, morning stiffness, muscle tenderness and myalgias. Negative for joint swelling.  Skin: Negative for color change, rash and redness.  Allergic/Immunologic: Negative for susceptible to infections.  Neurological: Negative for dizziness, numbness, headaches, memory loss and weakness.  Hematological: Negative for bruising/bleeding tendency.  Psychiatric/Behavioral: Negative for confusion and sleep disturbance.    PMFS History:  Patient Active Problem List   Diagnosis Date Noted  . Ingrown toenail 02/02/2019  . Primary osteoarthritis of both feet 03/21/2017  . History of macular degeneration 03/21/2017  . Other fatigue 03/21/2017  . Age-related osteoporosis without current pathological fracture 03/21/2017  . Esophageal reflux 06/29/2013  . Actinic keratosis 04/11/2013  . Systemic lupus erythematosus ([place]) 04/11/2013  . OA (osteoarthritis) of knee 03/02/2013  . DEPRESSION 07/24/2010  . Major depressive disorder, single episode, unspecified 07/24/2010  . UTI 07/04/2010  . ABDOMINAL BLOATING 01/20/2010  . Fibromyalgia 10/04/2009  . INSOMNIA, CHRONIC 10/04/2009  . Autoimmune disease ([place]) 10/04/2009  . Vitamin D deficiency 10/26/2008  . HYPOTHYROIDISM 08/13/2008  . HYPERLIPIDEMIA 08/13/2008  . HYPERTENSION 08/13/2008  . HEADACHE 08/13/2008  . Muscle pain 08/12/2008    Past Medical History:  Diagnosis Date  . Arthritis    OA AND  PAIN RT KNEE  . Autoimmune disease ([place])    + ANA, and DS DNA--PT STATES SHE WAS TOLD SHE DOES NOT HAVE LUPUS AS PREVIOUSLY THOUGHT  . Fibromyalgia    followed by Dr. Estanislado Pandy  . Hemorrhoids   . Hyperlipidemia   . Hypertension    PAST HX OF HYPERTENSION - TOOK MEDICATION--BUT OFF MEDICATION FOR YEARS-NO LONGER A PROBLEM   . Hypothyroidism   . Macular degeneration    BOTH EYES  . PONV (postoperative nausea and vomiting)   . Thyroid disease    Hypothyroidism    Family History  Problem Relation Age of Onset  . Stroke Mother   . Hypertension Mother   . Hyperlipidemia Mother   . Cancer Mother        Lung  . Alzheimer's disease Mother   . Stroke Father   . Hypertension Father   . Diabetes Father        Type II  . Hyperlipidemia Father   . Cancer Father        Lung  . Arthritis Father        Rheumatoid  . Thyroid disease Daughter    Past Surgical History:  Procedure Laterality Date  . ABDOMINAL HYSTERECTOMY  1989  . BUNIONECTOMY Left 2012  . CHOLECYSTECTOMY  1996  . EYE SURGERY     BILATERAL CATARACT EXTRACTION  . KNEE ARTHROSCOPY  1994   Right Knee  . KNEE SURGERY    . NECK SURGERY  2003  . TONSILLECTOMY  1953  . TOTAL KNEE ARTHROPLASTY Right 03/02/2013   Procedure: RIGHT TOTAL KNEE ARTHROPLASTY;  Surgeon: Gearlean Alf, MD;  Location: [facility];  Service: Orthopedics;  Laterality: Right;   Social History   Social History Narrative   Retired   Married   1 daughter/1 grandson   Never Smoked   Alcohol use - yes (social)   Immunization History  Administered Date(s) Administered  . Influenza Whole 08/12/2008, 08/30/2009, 08/23/2010  . Pneumococcal Polysaccharide-23 03/29/2009  . Td 07/22/2007     Objective: Vital Signs: BP 118/77 (BP Location: Left Arm, Patient Position: Sitting, Cuff Size: Normal)   Pulse 86   Resp 15   Ht 5' 0.75" (1.543 m)   Wt 165 lb (74.8 kg)   BMI 31.43 kg/m    Physical Exam Vitals and nursing note reviewed.  Constitutional:      Appearance: She is well-developed.  HENT:     Head: Normocephalic and atraumatic.  Eyes:     Conjunctiva/sclera: Conjunctivae normal.  Cardiovascular:     Rate and Rhythm: Normal rate and regular rhythm.     Heart sounds: Normal heart sounds.  Pulmonary:     Effort: Pulmonary effort is normal.     Breath sounds:  Normal breath sounds.  Abdominal:     General: Bowel sounds are normal.     Palpations: Abdomen is soft.  Musculoskeletal:     Cervical back: Normal range of motion.  Lymphadenopathy:     Cervical: No cervical adenopathy.  Skin:    General: Skin is warm and dry.     Capillary Refill: Capillary refill takes less than 2 seconds.  Neurological:     Mental Status: She is alert and oriented to person, place, and time.  Psychiatric:        Behavior: Behavior normal.      Musculoskeletal Exam: C-spine was in good range of motion.  She has thoracic kyphosis.  She has some discomfort range of motion lumbar spine.  Shoulder joints, elbow joints, wrist joints with good range of motion.  She has bilateral CMC prominence and tenderness.  No MCP PIP or DIP thickening was noted.  She has good range of motion of bilateral hip joints.  Her right knee joint is replaced.  She had discomfort range of motion of her left knee joint without any warmth swelling or effusion.  She has osteoarthritic changes in her bilateral first MTPs.  CDAI Exam: CDAI Score: -- Patient Global: --; Provider Global: -- Swollen: --; Tender: -- Joint Exam 06/01/2020   No joint exam has been documented for this visit   There is currently no information documented on the homunculus. Go to the Rheumatology activity and complete the homunculus joint exam.  Investigation: No additional findings.  Imaging: MM 3D SCREEN BREAST BILATERAL  Result Date: 05/30/2020 CLINICAL DATA:  Screening. EXAM: DIGITAL SCREENING BILATERAL MAMMOGRAM WITH TOMO AND CAD COMPARISON:  Previous exam(s). ACR Breast Density Category b: There are scattered areas of fibroglandular density. FINDINGS: There are no findings suspicious for malignancy. Images were processed with CAD. IMPRESSION: No mammographic evidence of malignancy. A result letter of this screening mammogram will be mailed directly to the patient. RECOMMENDATION: Screening mammogram in one year.  (Code:SM-B-01Y) BI-RADS CATEGORY  1: Negative. Electronically Signed   By: Lovey Newcomer M.D.   On: 05/30/2020 14:25    Recent Labs: Lab Results  Component Value Date   WBC 5.0 03/30/2020   HGB 12.1 03/30/2020   PLT 226 03/30/2020   NA 140 03/30/2020   K 4.1 03/30/2020   CL 111 (H) 03/30/2020   CO2 24 03/30/2020   GLUCOSE 126 (H) 03/30/2020   BUN 19 03/30/2020   CREATININE 0.94 (H) 03/30/2020   BILITOT 0.3 03/30/2020   ALKPHOS 77 02/24/2013   AST 15 03/30/2020   ALT 12 03/30/2020   PROT 5.7 (L) 03/30/2020   ALBUMIN 3.5 02/24/2013   CALCIUM 8.3 (L) 03/30/2020   GFRAA 70 03/30/2020    Speciality Comments: Osteoporosis therapy: Prolia started in April 2019  Procedures:  No procedures performed Allergies: Codeine and Hydrocodone   Assessment / Plan:     Visit Diagnoses: Age-related osteoporosis without current pathological fracture - DEXA 10/22/19 BMD 0.525 with T-score -2.9. previously on fosamax, started on prolia in April 2019. Last prolia injection: 04/13/2020.  Vitamin D deficiency-she takes vitamin D supplement.  Primary osteoarthritis of left knee - followed by Dr. Maureen Ralphs.  Patient had an adequate response to cortisone injections.  She will be getting Visco supplement injections.  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  Primary osteoarthritis of both feet-proper fitting shoes were emphasized.  Trochanteric bursitis of both hips-IT band stretches were discussed.  Medication monitoring encounter -we had detailed discussion regarding tramadol use.  She states she takes that she takes 2 tablets of tramadol in the morning which helps her to function through the day.  Occasionally she will take 1 or 2 tablet in the afternoon.. UDS: 03/17/2020, narcotic agreement: 09/24/2019   Fibromyalgia-she continues to have generalized pain discomfort and positive tender points.  Other fatigue-persists.  Primary insomnia - Topamax 50 mg 3 tablets by mouth at  bedtime.  She has discontinued Zanaflex and Xanax per our recommendations.  I also discussed tapering Topamax gradually.  Positive ANA (antinuclear antibody) - ANA negative, double-stranded DNA 59, SCL 70 -, Ro negative, La negative, Smith negative, RNP negative, sed rate 6, complements within normal limits on 09/24/19.  Patient has no clinical features of autoimmune  disease.  History of hypothyroidism  History of hypertension  History of depression  History of hyperlipidemia  History of anxiety  History of macular degeneration  Lymphedema  Patient had COVID-19 vaccine.  She was advised to continue wearing mask and follow precautions.  I also discussed that in case she develops COVID-19 infection she will be a candidate for Covid antibody infusion based on her age.  Orders: No orders of the defined types were placed in this encounter.  No orders of the defined types were placed in this encounter.     Follow-Up Instructions: Return in about 6 months (around 12/02/2020) for Osteoporosis, Osteoarthritis.   Bo Merino, MD  Note - This record has been created using Editor, commissioning.  Chart creation errors have been sought, but may not always  have been located. Such creation errors do not reflect on  the standard of medical care.

## 2020-05-27 ENCOUNTER — Other Ambulatory Visit: Payer: Self-pay

## 2020-05-27 ENCOUNTER — Ambulatory Visit
Admission: RE | Admit: 2020-05-27 | Discharge: 2020-05-27 | Disposition: A | Discharge status: Home / Self Care | Payer: Medicare Other | Source: Ambulatory Visit | Attending: Internal Medicine | Admitting: Internal Medicine

## 2020-05-27 DIAGNOSIS — Z1231 Encounter for screening mammogram for malignant neoplasm of breast: Secondary | ICD-10-CM

## 2020-06-01 ENCOUNTER — Ambulatory Visit: Payer: Medicare Other | Admitting: Rheumatology

## 2020-06-01 ENCOUNTER — Encounter: Payer: Self-pay | Admitting: Rheumatology

## 2020-06-01 ENCOUNTER — Other Ambulatory Visit: Payer: Self-pay

## 2020-06-01 VITALS — BP 118/77 | HR 86 | Resp 15 | Ht 60.75 in | Wt 165.0 lb

## 2020-06-01 DIAGNOSIS — Z8659 Personal history of other mental and behavioral disorders: Secondary | ICD-10-CM

## 2020-06-01 DIAGNOSIS — R768 Other specified abnormal immunological findings in serum: Secondary | ICD-10-CM

## 2020-06-01 DIAGNOSIS — M1712 Unilateral primary osteoarthritis, left knee: Secondary | ICD-10-CM

## 2020-06-01 DIAGNOSIS — I890 Lymphedema, not elsewhere classified: Secondary | ICD-10-CM

## 2020-06-01 DIAGNOSIS — Z96651 Presence of right artificial knee joint: Secondary | ICD-10-CM

## 2020-06-01 DIAGNOSIS — M810 Age-related osteoporosis without current pathological fracture: Secondary | ICD-10-CM | POA: Diagnosis not present

## 2020-06-01 DIAGNOSIS — Z8639 Personal history of other endocrine, nutritional and metabolic disease: Secondary | ICD-10-CM

## 2020-06-01 DIAGNOSIS — M19072 Primary osteoarthritis, left ankle and foot: Secondary | ICD-10-CM

## 2020-06-01 DIAGNOSIS — M7062 Trochanteric bursitis, left hip: Secondary | ICD-10-CM

## 2020-06-01 DIAGNOSIS — E559 Vitamin D deficiency, unspecified: Secondary | ICD-10-CM

## 2020-06-01 DIAGNOSIS — R5383 Other fatigue: Secondary | ICD-10-CM

## 2020-06-01 DIAGNOSIS — Z8669 Personal history of other diseases of the nervous system and sense organs: Secondary | ICD-10-CM

## 2020-06-01 DIAGNOSIS — M7061 Trochanteric bursitis, right hip: Secondary | ICD-10-CM

## 2020-06-01 DIAGNOSIS — M797 Fibromyalgia: Secondary | ICD-10-CM

## 2020-06-01 DIAGNOSIS — F5101 Primary insomnia: Secondary | ICD-10-CM

## 2020-06-01 DIAGNOSIS — M19071 Primary osteoarthritis, right ankle and foot: Secondary | ICD-10-CM

## 2020-06-01 DIAGNOSIS — Z5181 Encounter for therapeutic drug level monitoring: Secondary | ICD-10-CM

## 2020-06-01 DIAGNOSIS — Z8679 Personal history of other diseases of the circulatory system: Secondary | ICD-10-CM

## 2020-07-04 ENCOUNTER — Other Ambulatory Visit: Payer: Self-pay | Admitting: Rheumatology

## 2020-07-04 NOTE — Telephone Encounter (Signed)
Last Visit: 06/01/2020 Next Visit 11/30/2020  Okay to refill per Dr. Estanislado Pandy

## 2020-08-23 ENCOUNTER — Other Ambulatory Visit: Payer: Self-pay | Admitting: *Deleted

## 2020-08-23 MED ORDER — TRAMADOL HCL 50 MG PO TABS
ORAL_TABLET | ORAL | 0 refills | Status: DC
Start: 2020-08-23 — End: 2020-09-20

## 2020-08-23 NOTE — Telephone Encounter (Signed)
Refill request received via fax  Last Visit: 06/01/2020 Next Visit 11/30/2020 UDS: 03/17/2020 Narc Agreement: 09/24/2019  Last Fill: 04/06/2020  Okay to refill Tramadol?

## 2020-08-25 ENCOUNTER — Telehealth: Payer: Self-pay

## 2020-08-25 NOTE — Telephone Encounter (Signed)
Patient left a voicemail requesting a return call to discuss her Prolia injection.

## 2020-08-26 NOTE — Telephone Encounter (Signed)
He does not have to postpone Prolia injection.  Although it will be her choice to delay the injection.

## 2020-08-26 NOTE — Telephone Encounter (Signed)
Patient states she is due for her Prolia injection in December 2021. Patient is scheduled for knee surgery on 10/10/2020. Patient would like to know if she should postpone her Prolia injection. Please advise.

## 2020-08-26 NOTE — Telephone Encounter (Signed)
Patient advised she does not have to postpone Prolia injection.  Although it will be her choice to delay the injection. Patient states she will delay as her Prolia is due 10/13/2020 and she will have just had surgery. Patient will contact the office office prior to her scheduled appointment in January 2022 so she may come to have her labs before Prolia.

## 2020-08-26 NOTE — Telephone Encounter (Signed)
Attempted to contact the patient and left message for patient to call the office.  

## 2020-09-19 ENCOUNTER — Other Ambulatory Visit: Payer: Self-pay | Admitting: Rheumatology

## 2020-09-20 NOTE — Telephone Encounter (Signed)
Last OV 06/01/2020 Last RF 08/23/2020   # 120 UDS       03/17/2020  Patient is having TKR w/Dr. Wynelle Link 10/10/2020

## 2020-10-03 NOTE — Patient Instructions (Addendum)
DUE TO COVID-19 ONLY ONE VISITOR IS ALLOWED TO COME WITH YOU AND STAY IN THE WAITING ROOM ONLY DURING PRE OP AND PROCEDURE DAY OF SURGERY. THE 1 VISITOR  MAY VISIT WITH YOU AFTER SURGERY IN YOUR PRIVATE ROOM DURING VISITING HOURS ONLY!  YOU NEED TO HAVE A COVID 19 TEST ON__12/2_____ @_11 :15______, THIS TEST MUST BE DONE BEFORE SURGERY,  COVID TESTING SITE [place] [id number], IT IS ON THE RIGHT GOING OUT [address] APPROXIMATELY  2 MINUTES PAST [employer] ON THE RIGHT. ONCE YOUR COVID TEST IS COMPLETED,  PLEASE BEGIN THE QUARANTINE INSTRUCTIONS AS OUTLINED IN YOUR HANDOUT.                TANNAH DREYFUSS    Your procedure is scheduled on: 10/10/20   Report to [facility] Main  Entrance   Report to admitting at   10:10 AM     Call this number if you have problems the morning of surgery [place], NO CHEWING GUM [place].   No food after midnight.    You may have clear liquid until 9:30 AM.      CLEAR LIQUID DIET   Foods Allowed                                                                     Foods Excluded  Coffee and tea, regular and decaf                             liquids that you cannot  Plain Jell-O any favor except red or purple                                           see through such as: Fruit ices (not with fruit pulp)                                     milk, soups, orange juice  Iced Popsicles                                    All solid food Carbonated beverages, regular and diet                                    Cranberry, grape and apple juices Sports drinks like Gatorade Lightly seasoned clear broth or consume(fat free) Sugar, honey syrup      At 9:00 AM drink pre surgery drink  . Nothing by mouth after 9:30 AM.   Take these medicines the morning of surgery with A SIP OF WATER: Lexapro,Levothyroxine, pantoprazole  You may not have any metal on your body including hair pins and              piercings  Do not wear jewelry, make-up, lotions, powders or perfumes, deodorant             Do not wear nail polish on your fingernails.  Do not shave  48 hours prior to surgery.         Do not bring valuables to the hospital. [place].  Contacts, dentures or bridgework may not be worn into surgery.       Name and phone number of your driver:  Special Instructions: N/A              Please read over the following fact sheets you were given: _____________________________________________________________________             [facility] - Preparing for Surgery Before surgery, you can play an important role.   Because skin is not sterile, your skin needs to be as free of germs as possible.   You can reduce the number of germs on your skin by washing with CHG (chlorahexidine gluconate) soap before surgery.   CHG is an antiseptic cleaner which kills germs and bonds with the skin to continue killing germs even after washing. Please DO NOT use if you have an allergy to CHG or antibacterial soaps .  If your skin becomes reddened/irritated stop using the CHG and inform your nurse when you arrive at Short Stay. Do not shave (including legs and underarms) for at least 48 hours prior to the first CHG shower.    Please follow these instructions carefully:  1.  Shower with CHG Soap the night before surgery and the  morning of Surgery.  2.  If you choose to wash your hair, wash your hair first as usual with your  normal  shampoo.  3.  After you shampoo, rinse your hair and body thoroughly to remove the  shampoo.                                        4.  Use CHG as you would any other liquid soap.  You can apply chg directly  to the skin and wash                       Gently with a scrungie or clean washcloth.  5.  Apply the CHG Soap to your body ONLY FROM  THE NECK DOWN.   Do not use on face/ open                           Wound or open sores. Avoid contact with eyes, ears mouth and genitals (private parts).                       Wash face,  Genitals (private parts) with your normal soap.             6.  Wash thoroughly, paying special attention to the area where your surgery  will be performed.  7.  Thoroughly rinse your body with warm water from the neck down.  8.  DO NOT shower/wash with your normal soap  after using and rinsing off  the CHG Soap.             9.  Pat yourself dry with a clean towel.            10.  Wear clean pajamas.            11.  Place clean sheets on your bed the night of your first shower and do not  sleep with pets. Day of Surgery : Do not apply any lotions/deodorants the morning of surgery.  Please wear clean clothes to the hospital/surgery center.  FAILURE TO FOLLOW THESE INSTRUCTIONS MAY RESULT IN THE CANCELLATION OF YOUR SURGERY PATIENT SIGNATURE_________________________________  NURSE SIGNATURE__________________________________  ________________________________________________________________________   Adam Phenix  An incentive spirometer is a tool that can help keep your lungs clear and active. This tool measures how well you are filling your lungs with each breath. Taking long deep breaths may help reverse or decrease the chance of developing breathing (pulmonary) problems (especially infection) following:  A long period of time when you are unable to move or be active. BEFORE THE PROCEDURE   If the spirometer includes an indicator to show your best effort, your nurse or respiratory therapist will set it to a desired goal.  If possible, sit up straight or lean slightly forward. Try not to slouch.  Hold the incentive spirometer in an upright position. INSTRUCTIONS FOR USE  1. Sit on the edge of your bed if possible, or sit up as far as you can in bed or on a chair. 2. Hold the incentive  spirometer in an upright position. 3. Breathe out normally. 4. Place the mouthpiece in your mouth and seal your lips tightly around it. 5. Breathe in slowly and as deeply as possible, raising the piston or the ball toward the top of the column. 6. Hold your breath for 3-5 seconds or for as long as possible. Allow the piston or ball to fall to the bottom of the column. 7. Remove the mouthpiece from your mouth and breathe out normally. 8. Rest for a few seconds and repeat Steps 1 through 7 at least 10 times every 1-2 hours when you are awake. Take your time and take a few normal breaths between deep breaths. 9. The spirometer may include an indicator to show your best effort. Use the indicator as a goal to work toward during each repetition. 10. After each set of 10 deep breaths, practice coughing to be sure your lungs are clear. If you have an incision (the cut made at the time of surgery), support your incision when coughing by placing a pillow or rolled up towels firmly against it. Once you are able to get out of bed, walk around indoors and cough well. You may stop using the incentive spirometer when instructed by your caregiver.  RISKS AND COMPLICATIONS  Take your time so you do not get dizzy or light-headed.  If you are in pain, you may need to take or ask for pain medication before doing incentive spirometry. It is harder to take a deep breath if you are having pain. AFTER USE  Rest and breathe slowly and easily.  It can be helpful to keep track of a log of your progress. Your caregiver can provide you with a simple table to help with this. If you are using the spirometer at home, follow these instructions: [place] IF:   You are having difficultly using the spirometer.  You have trouble using the  spirometer as often as instructed.  Your pain medication is not giving enough relief while using the spirometer.  You develop fever of 100.5 F (38.1 C) or higher. SEEK  IMMEDIATE MEDICAL CARE IF:   You cough up bloody sputum that had not been present before.  You develop fever of 102 F (38.9 C) or greater.  You develop worsening pain at or near the incision site. MAKE SURE YOU:   Understand these instructions.  Will watch your condition.  Will get help right away if you are not doing well or get worse. Document Released: 03/04/2007 Document Revised: 01/14/2012 Document Reviewed: 05/05/2007 [facility] Patient Information 2014 [place], [place].   ________________________________________________________________________

## 2020-10-04 ENCOUNTER — Encounter (HOSPITAL_COMMUNITY)
Admission: RE | Admit: 2020-10-04 | Discharge: 2020-10-04 | Disposition: A | Discharge status: Home / Self Care | Payer: Medicare Other | Source: Ambulatory Visit | Attending: Orthopedic Surgery | Admitting: Orthopedic Surgery

## 2020-10-04 ENCOUNTER — Other Ambulatory Visit: Payer: Self-pay

## 2020-10-04 ENCOUNTER — Encounter (HOSPITAL_COMMUNITY): Payer: Self-pay

## 2020-10-04 DIAGNOSIS — I10 Essential (primary) hypertension: Secondary | ICD-10-CM | POA: Insufficient documentation

## 2020-10-04 DIAGNOSIS — Z01818 Encounter for other preprocedural examination: Secondary | ICD-10-CM | POA: Diagnosis present

## 2020-10-04 LAB — COMPREHENSIVE METABOLIC PANEL
ALT: 14 U/L (ref 0–44)
AST: 18 U/L (ref 15–41)
Albumin: 3.9 g/dL (ref 3.5–5.0)
Alkaline Phosphatase: 54 U/L (ref 38–126)
Anion gap: 5 (ref 5–15)
BUN: 17 mg/dL (ref 8–23)
CO2: 25 mmol/L (ref 22–32)
Calcium: 8.9 mg/dL (ref 8.9–10.3)
Chloride: 109 mmol/L (ref 98–111)
Creatinine, Ser: 0.89 mg/dL (ref 0.44–1.00)
GFR, Estimated: >60 mL/min (ref >60)
Glucose, Bld: 121 mg/dL — ABNORMAL HIGH (ref 70–99)
Potassium: 4.1 mmol/L (ref 3.5–5.1)
Sodium: 139 mmol/L (ref 135–145)
Total Bilirubin: 0.6 mg/dL (ref 0.3–1.2)
Total Protein: 6.8 g/dL (ref 6.5–8.1)

## 2020-10-04 LAB — SURGICAL PCR SCREEN
MRSA, PCR: NEGATIVE
Staphylococcus aureus: NEGATIVE

## 2020-10-04 LAB — CBC
HCT: 41.5 % (ref 36.0–46.0)
Hemoglobin: 13.2 g/dL (ref 12.0–15.0)
MCH: 31.2 pg (ref 26.0–34.0)
MCHC: 31.8 g/dL (ref 30.0–36.0)
MCV: 98.1 fL (ref 80.0–100.0)
Platelets: 220 10*3/uL (ref 150–400)
RBC: 4.23 MIL/uL (ref 3.87–5.11)
RDW: 13.4 % (ref 11.5–15.5)
WBC: 4.6 10*3/uL (ref 4.0–10.5)
nRBC: 0 % (ref 0.0–0.2)

## 2020-10-04 LAB — PROTIME-INR
INR: 1.1 (ref 0.8–1.2)
Prothrombin Time: 13.3 seconds (ref 11.4–15.2)

## 2020-10-04 LAB — APTT: aPTT: 27 seconds (ref 24–36)

## 2020-10-04 NOTE — Progress Notes (Signed)
COVID Vaccine Completed:Yes Date COVID Vaccine completed:12/17/19 COVID vaccine manufacturer: Pfizer     PCP - Dr. Coralie Keens Cardiologist - none  Chest x-ray - no EKG - no Stress Test -no ECHO - no Cardiac Cath - no Pacemaker/ICD device last checked:NA  Sleep Study - no CPAP -   Fasting Blood Sugar - NA Checks Blood Sugar _____ times a day  Blood Thinner Instructions:ASA/ [place] Aspirin Instructions:stop 5 days prior/ [place] Last Dose:10/04/20  Anesthesia review:   Patient denies shortness of breath, fever, cough and chest pain at PAT appointment yes   Patient verbalized understanding of instructions that were given to them at the PAT appointment. Patient was also instructed that they will need to review over the PAT instructions again at home before surgery. Yes Pt denies SOB climbing stairs, doing housework and ADLs

## 2020-10-06 ENCOUNTER — Other Ambulatory Visit (HOSPITAL_COMMUNITY)
Admission: RE | Admit: 2020-10-06 | Discharge: 2020-10-06 | Disposition: A | Discharge status: Home / Self Care | Payer: Medicare Other | Source: Ambulatory Visit | Attending: Orthopedic Surgery | Admitting: Orthopedic Surgery

## 2020-10-06 DIAGNOSIS — Z01812 Encounter for preprocedural laboratory examination: Secondary | ICD-10-CM | POA: Insufficient documentation

## 2020-10-06 DIAGNOSIS — Z20822 Contact with and (suspected) exposure to covid-19: Secondary | ICD-10-CM | POA: Diagnosis not present

## 2020-10-06 LAB — SARS CORONAVIRUS 2 (TAT 6-24 HRS): SARS Coronavirus 2: NEGATIVE

## 2020-10-07 NOTE — H&P (Signed)
TOTAL KNEE ADMISSION H&P  Patient is being admitted for left total knee arthroplasty.  Subjective:  Chief Complaint: Left knee pain.  HPI: Paula Murphy, 74 y.o. female has a history of pain and functional disability in the left knee due to arthritis and has failed non-surgical conservative treatments for greater than 12 weeks to include viscosupplementation injections and activity modification. Onset of symptoms was gradual, starting several years ago with gradually worsening course since that time. The patient noted no past surgery on the left knee.  Patient currently rates pain in the left knee at 8 out of 10 with activity. Patient has worsening of pain with activity and weight bearing, pain that interferes with activities of daily living and crepitus. Patient has evidence of bone-on-bone arthritis in the medial and patellofemoral compartments of the left knee with slight varus by imaging studies. There is no active infection.  Patient Active Problem List   Diagnosis Date Noted  . Ingrown toenail 02/02/2019  . Primary osteoarthritis of both feet 03/21/2017  . History of macular degeneration 03/21/2017  . Other fatigue 03/21/2017  . Age-related osteoporosis without current pathological fracture 03/21/2017  . Esophageal reflux 06/29/2013  . Actinic keratosis 04/11/2013  . Systemic lupus erythematosus ([place]) 04/11/2013  . OA (osteoarthritis) of knee 03/02/2013  . DEPRESSION 07/24/2010  . Major depressive disorder, single episode, unspecified 07/24/2010  . UTI 07/04/2010  . ABDOMINAL BLOATING 01/20/2010  . Fibromyalgia 10/04/2009  . INSOMNIA, CHRONIC 10/04/2009  . Autoimmune disease ([place]) 10/04/2009  . Vitamin D deficiency 10/26/2008  . HYPOTHYROIDISM 08/13/2008  . HYPERLIPIDEMIA 08/13/2008  . HYPERTENSION 08/13/2008  . HEADACHE 08/13/2008  . Muscle pain 08/12/2008    Past Medical History:  Diagnosis Date  . Arthritis    OA AND PAIN RT KNEE  . Autoimmune disease ([place])    +  ANA, and DS DNA--PT STATES SHE WAS TOLD SHE DOES NOT HAVE LUPUS AS PREVIOUSLY THOUGHT  . Fibromyalgia    followed by Dr. Estanislado Pandy  . Hemorrhoids   . Hyperlipidemia   . Hypothyroidism   . Macular degeneration    BOTH EYES  . PONV (postoperative nausea and vomiting)   . Thyroid disease    Hypothyroidism    Past Surgical History:  Procedure Laterality Date  . ABDOMINAL HYSTERECTOMY  1989  . BUNIONECTOMY Left 2012  . CHOLECYSTECTOMY  1996  . EYE SURGERY     BILATERAL CATARACT EXTRACTION  . KNEE ARTHROSCOPY  1994   Right Knee  . KNEE SURGERY    . NECK SURGERY  2003  . TONSILLECTOMY  1953  . TOTAL KNEE ARTHROPLASTY Right 03/02/2013   Procedure: RIGHT TOTAL KNEE ARTHROPLASTY;  Surgeon: Gearlean Alf, MD;  Location: [facility];  Service: Orthopedics;  Laterality: Right;    Prior to Admission medications   Medication Sig Start Date End Date Taking? Authorizing Provider  amoxicillin (AMOXIL) 500 MG capsule Take 2,000 mg by mouth once.   Yes [provider]  aspirin EC 81 MG tablet Take 81 mg by mouth every evening.   Yes [provider]  Bisacodyl (LAXATIVE PO) Take 2 tablets by mouth daily. Vegetable   Yes [provider]  Cholecalciferol (VITAMIN D3) 5000 units CAPS Take 5,000 Units by mouth daily.    Yes [provider]  diclofenac sodium (VOLTAREN) 1 % GEL Apply 2 to 4 grams to affected joint up to 4 times daily PRN. Patient taking differently: Apply 2 g topically daily as needed (pain).  01/01/19  Yes Quita Skye,  Geni Bers, PA-C  escitalopram (LEXAPRO) 10 MG tablet Take 10 mg by mouth daily.  04/06/16  Yes [provider]  ezetimibe (ZETIA) 10 MG tablet Take 10 mg by mouth daily.  12/18/16 09/28/20 Yes [provider]  fluticasone (FLONASE) 50 MCG/ACT nasal spray Place 1 spray into both nostrils daily as needed for allergies.  08/21/16  Yes [provider]  levothyroxine (SYNTHROID, LEVOTHROID) 125 MCG tablet Take 125 mcg by mouth  daily before breakfast.  01/21/17  Yes [provider]  Multiple Vitamins-Minerals (PRESERVISION AREDS) CAPS Take 1 capsule by mouth daily.   Yes [provider]  ondansetron (ZOFRAN) 4 MG tablet Take 4 mg by mouth daily as needed for nausea or vomiting.  03/18/17  Yes [provider]  pantoprazole (PROTONIX) 40 MG tablet Take 40 mg by mouth daily. 09/07/20  Yes [provider]  PROLIA 60 MG/ML SOSY injection Inject 60 mg into the skin every 6 (six) months.  04/11/20  Yes [provider]  simethicone (MYLICON) 242 MG chewable tablet 125 mg daily as needed for flatulence.  05/27/14  Yes [provider]  topiramate (TOPAMAX) 50 MG tablet TAKE 3 TABLETS BY MOUTH AT  BEDTIME Patient taking differently: Take 150 mg by mouth at bedtime.  07/04/20  Yes Deveshwar, Abel Presto, MD  traMADol (ULTRAM) 50 MG tablet TAKE 1 TO 2 TABLETS BY  MOUTH TWICE DAILY AS NEEDED Patient taking differently: Take 100 mg by mouth daily.  09/20/20  Yes Deveshwar, Abel Presto, MD  traZODone (DESYREL) 50 MG tablet Take 50 mg by mouth daily as needed for sleep.  09/16/20  Yes [provider]  valACYclovir (VALTREX) 500 MG tablet Take 500 mg by mouth daily as needed (fever blister).  05/26/14  Yes [provider]  vitamin B-12 (CYANOCOBALAMIN) 500 MCG tablet Take 500 mcg by mouth daily.   Yes [provider]    Allergies  Allergen Reactions  . Codeine Nausea And Vomiting    NAUSEA & VOMITING  . Hydrocodone Nausea And Vomiting    Social History   Socioeconomic History  . Marital status: Divorced    Spouse name: Tommy  . Number of children: 1  . Years of education: Not on file  . Highest education level: Not on file  Occupational History  . Occupation: Retired    Fish farm manager: RETIRED  Tobacco Use  . Smoking status: Never Smoker  . Smokeless tobacco: Never Used  Vaping Use  . Vaping Use: Never used  Substance and Sexual Activity  . Alcohol use: Not  Currently  . Drug use: No  . Sexual activity: Not on file  Other Topics Concern  . Not on file  Social History Narrative   Retired   Married   1 daughter/1 grandson   Never Smoked   Alcohol use - yes (social)   Social Determinants of Health   Financial Resource Strain:   . Difficulty of Paying Living Expenses: Not on file  Food Insecurity:   . Worried About Charity fundraiser in the Last Year: Not on file  . Ran Out of Food in the Last Year: Not on file  Transportation Needs:   . Lack of Transportation (Medical): Not on file  . Lack of Transportation (Non-Medical): Not on file  Physical Activity:   . Days of Exercise per Week: Not on file  . Minutes of Exercise per Session: Not on file  Stress:   . Feeling of Stress : Not on file  Social  Connections:   . Frequency of Communication with Friends and Family: Not on file  . Frequency of Social Gatherings with Friends and Family: Not on file  . Attends Religious Services: Not on file  . Active Member of Clubs or Organizations: Not on file  . Attends Archivist Meetings: Not on file  . Marital Status: Not on file  Intimate Partner Violence:   . Fear of Current or Ex-Partner: Not on file  . Emotionally Abused: Not on file  . Physically Abused: Not on file  . Sexually Abused: Not on file      Tobacco Use: Low Risk   . Smoking Tobacco Use: Never Smoker  . Smokeless Tobacco Use: Never Used   Social History   Substance and Sexual Activity  Alcohol Use Not Currently    Family History  Problem Relation Age of Onset  . Stroke Mother   . Hypertension Mother   . Hyperlipidemia Mother   . Cancer Mother        Lung  . Alzheimer's disease Mother   . Stroke Father   . Hypertension Father   . Diabetes Father        Type II  . Hyperlipidemia Father   . Cancer Father        Lung  . Arthritis Father        Rheumatoid  . Thyroid disease Daughter     Review of Systems  Constitutional: Negative for chills and  fever.  HENT: Negative for congestion, sore throat and tinnitus.   Eyes: Negative for double vision, photophobia and pain.  Respiratory: Negative for cough, shortness of breath and wheezing.   Cardiovascular: Negative for chest pain, palpitations and orthopnea.  Gastrointestinal: Negative for heartburn, nausea and vomiting.  Genitourinary: Negative for dysuria, frequency and urgency.  Musculoskeletal: Positive for joint pain.  Neurological: Negative for dizziness, weakness and headaches.    Objective:  Physical Exam: Well nourished and well developed.  General: Alert and oriented x3, cooperative and pleasant, no acute distress.  Head: normocephalic, atraumatic, neck supple.  Eyes: EOMI.  Respiratory: breath sounds clear in all fields, no wheezing, rales, or rhonchi. Cardiovascular: Regular rate and rhythm, no murmurs, gallops or rubs.  Abdomen: non-tender to palpation and soft, normoactive bowel sounds. Musculoskeletal:  Left Knee Exam:  No effusion present. No swelling present.  Slight varus deformity.  The range of motion is: 5 to 130 degrees.  Moderate crepitus on range of motion of the knee.  Positive medial joint line tenderness.  No lateral joint line tenderness.  There is no instability noted.   Calves soft and nontender. Motor function intact in LE. Strength 5/5 LE bilaterally. Neuro: Distal pulses 2+. Sensation to light touch intact in LE.  Imaging Review Plain radiographs demonstrate severe degenerative joint disease of the left knee. The overall alignment is mild varus. The bone quality appears to be adequate for age and reported activity level.  Assessment/Plan:  End stage arthritis, left knee   The patient history, physical examination, clinical judgment of the provider and imaging studies are consistent with end stage degenerative joint disease of the left knee and total knee arthroplasty is deemed medically necessary. The treatment options including medical  management, injection therapy arthroscopy and arthroplasty were discussed at length. The risks and benefits of total knee arthroplasty were presented and reviewed. The risks due to aseptic loosening, infection, stiffness, patella tracking problems, thromboembolic complications and other imponderables were discussed. The patient acknowledged the explanation, agreed to proceed with  the plan and consent was signed. Patient is being admitted for inpatient treatment for surgery, pain control, PT, OT, prophylactic antibiotics, VTE prophylaxis, progressive ambulation and ADLs and discharge planning. The patient is planning to be discharged home.   Patient's anticipated LOS is less than 2 midnights, meeting these requirements: - Younger than 35 - Lives within 1 hour of care - Has a competent adult at home to recover with post-op recover - NO history of  - Chronic pain requiring opiods  - Diabetes  - Coronary Artery Disease  - Heart failure  - Heart attack  - Stroke  - DVT/VTE  - Cardiac arrhythmia  - Respiratory Failure/COPD  - Renal failure  - Anemia  - Advanced Liver disease  Therapy Plans: Outpatient therapy with [facility] Disposition: Home with daughter Planned DVT Prophylaxis: Aspirin 325 mg BID DME Needed: None PCP: Arlyss Repress, MD TXA: IV Allergies: Codeine (vomiting) Anesthesia Concerns: Nausea/vomiting BMI: 31.7 Last HgbA1c: Not diabetic. [facility]acy: [facility])  Other: - Appointment on 12/2 - Dilaudid, gabapentin, already takes tramadol  - Patient was instructed on what medications to stop prior to surgery. - Follow-up visit in 2 weeks with Dr. Wynelle Link - Begin physical therapy following surgery - Pre-operative lab work as pre-surgical testing - Prescriptions will be provided in hospital at time of discharge  Theresa Duty, PA-C Orthopedic Surgery [facility] [place]

## 2020-10-09 MED ORDER — BUPIVACAINE LIPOSOME 1.3 % IJ SUSP
20.0000 mL | Freq: Once | INTRAMUSCULAR | Status: AC
  Filled 2020-10-09: qty 20

## 2020-10-10 ENCOUNTER — Encounter (HOSPITAL_COMMUNITY)
Admission: RE | Disposition: A | Discharge status: Home / Self Care | Payer: Self-pay | Source: Home / Self Care | Attending: Orthopedic Surgery

## 2020-10-10 ENCOUNTER — Ambulatory Visit (HOSPITAL_COMMUNITY): Payer: Medicare Other | Admitting: Anesthesiology

## 2020-10-10 ENCOUNTER — Encounter (HOSPITAL_COMMUNITY): Payer: Self-pay | Admitting: Orthopedic Surgery

## 2020-10-10 ENCOUNTER — Other Ambulatory Visit: Payer: Self-pay

## 2020-10-10 ENCOUNTER — Observation Stay (HOSPITAL_COMMUNITY)
Admission: RE | Admit: 2020-10-10 | Discharge: 2020-10-11 | Disposition: A | Discharge status: Home / Self Care | Payer: Medicare Other | Attending: Orthopedic Surgery | Admitting: Orthopedic Surgery

## 2020-10-10 DIAGNOSIS — M1710 Unilateral primary osteoarthritis, unspecified knee: Secondary | ICD-10-CM

## 2020-10-10 DIAGNOSIS — M179 Osteoarthritis of knee, unspecified: Secondary | ICD-10-CM

## 2020-10-10 DIAGNOSIS — Z96651 Presence of right artificial knee joint: Secondary | ICD-10-CM | POA: Diagnosis not present

## 2020-10-10 DIAGNOSIS — Z79899 Other long term (current) drug therapy: Secondary | ICD-10-CM | POA: Diagnosis not present

## 2020-10-10 DIAGNOSIS — I10 Essential (primary) hypertension: Secondary | ICD-10-CM | POA: Insufficient documentation

## 2020-10-10 DIAGNOSIS — E039 Hypothyroidism, unspecified: Secondary | ICD-10-CM | POA: Insufficient documentation

## 2020-10-10 DIAGNOSIS — M1712 Unilateral primary osteoarthritis, left knee: Principal | ICD-10-CM | POA: Diagnosis present

## 2020-10-10 HISTORY — PX: TOTAL KNEE ARTHROPLASTY: SHX125

## 2020-10-10 LAB — TYPE AND SCREEN
ABO/RH(D): A POS
Antibody Screen: NEGATIVE

## 2020-10-10 SURGERY — ARTHROPLASTY, KNEE, TOTAL
Anesthesia: Monitor Anesthesia Care | Site: Knee | Laterality: Left

## 2020-10-10 MED ORDER — TRANEXAMIC ACID-NACL 1000-0.7 MG/100ML-% IV SOLN
1000.0000 mg | INTRAVENOUS | Status: AC
  Administered 2020-10-10: 1000 mg via INTRAVENOUS
  Filled 2020-10-10: qty 100

## 2020-10-10 MED ORDER — HYDROMORPHONE HCL 2 MG PO TABS
2.0000 mg | ORAL_TABLET | ORAL | Status: DC | PRN
  Administered 2020-10-10 – 2020-10-11 (×3): 2 mg via ORAL
  Filled 2020-10-10 (×3): qty 1

## 2020-10-10 MED ORDER — PHENYLEPHRINE 40 MCG/ML (10ML) SYRINGE FOR IV PUSH (FOR BLOOD PRESSURE SUPPORT)
PREFILLED_SYRINGE | INTRAVENOUS | Status: AC
  Filled 2020-10-10: qty 10

## 2020-10-10 MED ORDER — SODIUM CHLORIDE (PF) 0.9 % IJ SOLN
INTRAMUSCULAR | Status: AC
  Filled 2020-10-10: qty 10

## 2020-10-10 MED ORDER — PHENYLEPHRINE HCL (PRESSORS) 10 MG/ML IV SOLN
INTRAVENOUS | Status: AC
  Filled 2020-10-10: qty 1

## 2020-10-10 MED ORDER — PROPOFOL 500 MG/50ML IV EMUL
INTRAVENOUS | Status: DC | PRN
  Administered 2020-10-10: 100 ug/kg/min via INTRAVENOUS

## 2020-10-10 MED ORDER — DEXAMETHASONE SODIUM PHOSPHATE 10 MG/ML IJ SOLN
8.0000 mg | Freq: Once | INTRAMUSCULAR | Status: AC
  Administered 2020-10-10: 8 mg via INTRAVENOUS

## 2020-10-10 MED ORDER — METHOCARBAMOL 1000 MG/10ML IJ SOLN
500.0000 mg | Freq: Four times a day (QID) | INTRAVENOUS | Status: DC | PRN
  Filled 2020-10-10: qty 5

## 2020-10-10 MED ORDER — LEVOTHYROXINE SODIUM 125 MCG PO TABS
125.0000 ug | ORAL_TABLET | Freq: Every day | ORAL | Status: DC
  Administered 2020-10-11: 125 ug via ORAL
  Filled 2020-10-10: qty 1

## 2020-10-10 MED ORDER — SODIUM CHLORIDE (PF) 0.9 % IJ SOLN
INTRAMUSCULAR | Status: DC | PRN
  Administered 2020-10-10: 60 mL

## 2020-10-10 MED ORDER — FLEET ENEMA 7-19 GM/118ML RE ENEM: 1.0000 | ENEMA | Freq: Once | RECTAL | Status: DC | PRN

## 2020-10-10 MED ORDER — LIDOCAINE HCL (CARDIAC) PF 100 MG/5ML IV SOSY
PREFILLED_SYRINGE | INTRAVENOUS | Status: DC | PRN
  Administered 2020-10-10: 60 mg via INTRAVENOUS

## 2020-10-10 MED ORDER — ESCITALOPRAM OXALATE 10 MG PO TABS
10.0000 mg | ORAL_TABLET | Freq: Every day | ORAL | Status: DC
  Administered 2020-10-11: 10 mg via ORAL
  Filled 2020-10-10: qty 1

## 2020-10-10 MED ORDER — FENTANYL CITRATE (PF) 100 MCG/2ML IJ SOLN
50.0000 ug | INTRAMUSCULAR | Status: DC
  Filled 2020-10-10: qty 2

## 2020-10-10 MED ORDER — TRAMADOL HCL 50 MG PO TABS: 50.0000 mg | ORAL_TABLET | Freq: Four times a day (QID) | ORAL | Status: DC | PRN

## 2020-10-10 MED ORDER — MORPHINE SULFATE (PF) 2 MG/ML IV SOLN: 0.5000 mg | INTRAVENOUS | Status: DC | PRN

## 2020-10-10 MED ORDER — DEXAMETHASONE SODIUM PHOSPHATE 10 MG/ML IJ SOLN
10.0000 mg | Freq: Once | INTRAMUSCULAR | Status: AC
  Administered 2020-10-11: 10 mg via INTRAVENOUS
  Filled 2020-10-10: qty 1

## 2020-10-10 MED ORDER — BISACODYL 10 MG RE SUPP: 10.0000 mg | Freq: Every day | RECTAL | Status: DC | PRN

## 2020-10-10 MED ORDER — ONDANSETRON HCL 4 MG/2ML IJ SOLN
INTRAMUSCULAR | Status: AC
  Filled 2020-10-10: qty 2

## 2020-10-10 MED ORDER — STERILE WATER FOR IRRIGATION IR SOLN
Status: DC | PRN
  Administered 2020-10-10: 2000 mL

## 2020-10-10 MED ORDER — TOPIRAMATE 25 MG PO TABS: 150.0000 mg | ORAL_TABLET | Freq: Every evening | ORAL | Status: DC | PRN

## 2020-10-10 MED ORDER — MENTHOL 3 MG MT LOZG: 1.0000 | LOZENGE | OROMUCOSAL | Status: DC | PRN

## 2020-10-10 MED ORDER — DIPHENHYDRAMINE HCL 12.5 MG/5ML PO ELIX
12.5000 mg | ORAL_SOLUTION | ORAL | Status: DC | PRN
  Administered 2020-10-10 – 2020-10-11 (×2): 25 mg via ORAL
  Filled 2020-10-10 (×2): qty 10

## 2020-10-10 MED ORDER — DEXAMETHASONE SODIUM PHOSPHATE 10 MG/ML IJ SOLN
INTRAMUSCULAR | Status: AC
  Filled 2020-10-10: qty 1

## 2020-10-10 MED ORDER — POLYETHYLENE GLYCOL 3350 17 G PO PACK: 17.0000 g | PACK | Freq: Every day | ORAL | Status: DC | PRN

## 2020-10-10 MED ORDER — ASPIRIN EC 325 MG PO TBEC
325.0000 mg | DELAYED_RELEASE_TABLET | Freq: Two times a day (BID) | ORAL | Status: DC
  Administered 2020-10-11: 325 mg via ORAL
  Filled 2020-10-10: qty 1

## 2020-10-10 MED ORDER — ACETAMINOPHEN 10 MG/ML IV SOLN
INTRAVENOUS | Status: DC | PRN
  Administered 2020-10-10: 1000 mg via INTRAVENOUS

## 2020-10-10 MED ORDER — LACTATED RINGERS IV SOLN: INTRAVENOUS | Status: DC

## 2020-10-10 MED ORDER — ONDANSETRON HCL 4 MG/2ML IJ SOLN
4.0000 mg | Freq: Four times a day (QID) | INTRAMUSCULAR | Status: DC | PRN
  Administered 2020-10-10: 4 mg via INTRAVENOUS
  Filled 2020-10-10: qty 2

## 2020-10-10 MED ORDER — 0.9 % SODIUM CHLORIDE (POUR BTL) OPTIME
TOPICAL | Status: DC | PRN
  Administered 2020-10-10: 1000 mL

## 2020-10-10 MED ORDER — ORAL CARE MOUTH RINSE: 15.0000 mL | Freq: Once | OROMUCOSAL | Status: AC

## 2020-10-10 MED ORDER — SODIUM CHLORIDE 0.9 % IR SOLN
Status: DC | PRN
  Administered 2020-10-10: 1000 mL

## 2020-10-10 MED ORDER — POVIDONE-IODINE 10 % EX SWAB
2.0000 "application " | Freq: Once | CUTANEOUS | Status: AC
  Administered 2020-10-10: 2 via TOPICAL

## 2020-10-10 MED ORDER — CHLORHEXIDINE GLUCONATE 0.12 % MT SOLN
15.0000 mL | Freq: Once | OROMUCOSAL | Status: AC
  Administered 2020-10-10: 15 mL via OROMUCOSAL

## 2020-10-10 MED ORDER — LACTATED RINGERS IV SOLN: INTRAVENOUS | Status: DC | PRN

## 2020-10-10 MED ORDER — ONDANSETRON HCL 4 MG/2ML IJ SOLN
INTRAMUSCULAR | Status: DC | PRN
  Administered 2020-10-10: 4 mg via INTRAVENOUS

## 2020-10-10 MED ORDER — BUPIVACAINE LIPOSOME 1.3 % IJ SUSP
INTRAMUSCULAR | Status: DC | PRN
  Administered 2020-10-10: 20 mL

## 2020-10-10 MED ORDER — MIDAZOLAM HCL 2 MG/2ML IJ SOLN
1.0000 mg | INTRAMUSCULAR | Status: DC
  Administered 2020-10-10: 1 mg via INTRAVENOUS
  Filled 2020-10-10: qty 2

## 2020-10-10 MED ORDER — CEFAZOLIN SODIUM-DEXTROSE 2-4 GM/100ML-% IV SOLN
2.0000 g | Freq: Four times a day (QID) | INTRAVENOUS | Status: AC
  Administered 2020-10-10 – 2020-10-11 (×2): 2 g via INTRAVENOUS
  Filled 2020-10-10 (×2): qty 100

## 2020-10-10 MED ORDER — METOCLOPRAMIDE HCL 5 MG PO TABS: 5.0000 mg | ORAL_TABLET | Freq: Three times a day (TID) | ORAL | Status: DC | PRN

## 2020-10-10 MED ORDER — PHENOL 1.4 % MT LIQD: 1.0000 | OROMUCOSAL | Status: DC | PRN

## 2020-10-10 MED ORDER — DOCUSATE SODIUM 100 MG PO CAPS
100.0000 mg | ORAL_CAPSULE | Freq: Two times a day (BID) | ORAL | Status: DC
  Administered 2020-10-10 – 2020-10-11 (×2): 100 mg via ORAL
  Filled 2020-10-10 (×2): qty 1

## 2020-10-10 MED ORDER — SODIUM CHLORIDE 0.9 % IV SOLN: INTRAVENOUS | Status: DC

## 2020-10-10 MED ORDER — PHENYLEPHRINE 40 MCG/ML (10ML) SYRINGE FOR IV PUSH (FOR BLOOD PRESSURE SUPPORT)
PREFILLED_SYRINGE | INTRAVENOUS | Status: DC | PRN
  Administered 2020-10-10 (×3): 80 ug via INTRAVENOUS

## 2020-10-10 MED ORDER — BUPIVACAINE IN DEXTROSE 0.75-8.25 % IT SOLN
INTRATHECAL | Status: DC | PRN
  Administered 2020-10-10: 1.6 mL via INTRATHECAL

## 2020-10-10 MED ORDER — SODIUM CHLORIDE (PF) 0.9 % IJ SOLN
INTRAMUSCULAR | Status: AC
  Filled 2020-10-10: qty 50

## 2020-10-10 MED ORDER — PROPOFOL 10 MG/ML IV BOLUS
INTRAVENOUS | Status: DC | PRN
  Administered 2020-10-10: 10 mg via INTRAVENOUS
  Administered 2020-10-10: 20 mg via INTRAVENOUS

## 2020-10-10 MED ORDER — PROPOFOL 500 MG/50ML IV EMUL
INTRAVENOUS | Status: AC
  Filled 2020-10-10: qty 50

## 2020-10-10 MED ORDER — GABAPENTIN 300 MG PO CAPS
300.0000 mg | ORAL_CAPSULE | Freq: Three times a day (TID) | ORAL | Status: DC
  Administered 2020-10-10 – 2020-10-11 (×3): 300 mg via ORAL
  Filled 2020-10-10 (×3): qty 1

## 2020-10-10 MED ORDER — CEFAZOLIN SODIUM-DEXTROSE 2-4 GM/100ML-% IV SOLN
2.0000 g | INTRAVENOUS | Status: AC
  Administered 2020-10-10: 2 g via INTRAVENOUS
  Filled 2020-10-10: qty 100

## 2020-10-10 MED ORDER — PANTOPRAZOLE SODIUM 40 MG PO TBEC
40.0000 mg | DELAYED_RELEASE_TABLET | Freq: Every day | ORAL | Status: DC
  Administered 2020-10-11: 40 mg via ORAL
  Filled 2020-10-10: qty 1

## 2020-10-10 MED ORDER — TRAZODONE HCL 50 MG PO TABS: 50.0000 mg | ORAL_TABLET | Freq: Every day | ORAL | Status: DC | PRN

## 2020-10-10 MED ORDER — ONDANSETRON HCL 4 MG PO TABS
4.0000 mg | ORAL_TABLET | Freq: Four times a day (QID) | ORAL | Status: DC | PRN
  Administered 2020-10-11: 4 mg via ORAL
  Filled 2020-10-10: qty 1

## 2020-10-10 MED ORDER — METOCLOPRAMIDE HCL 5 MG/ML IJ SOLN: 5.0000 mg | Freq: Three times a day (TID) | INTRAMUSCULAR | Status: DC | PRN

## 2020-10-10 MED ORDER — ACETAMINOPHEN 500 MG PO TABS
1000.0000 mg | ORAL_TABLET | Freq: Four times a day (QID) | ORAL | Status: AC
  Administered 2020-10-10 – 2020-10-11 (×4): 1000 mg via ORAL
  Filled 2020-10-10 (×3): qty 2

## 2020-10-10 MED ORDER — BUPIVACAINE-EPINEPHRINE (PF) 0.5% -1:200000 IJ SOLN
INTRAMUSCULAR | Status: DC | PRN
  Administered 2020-10-10: 20 mL via PERINEURAL

## 2020-10-10 MED ORDER — METHOCARBAMOL 500 MG PO TABS: 500.0000 mg | ORAL_TABLET | Freq: Four times a day (QID) | ORAL | Status: DC | PRN

## 2020-10-10 MED ORDER — ACETAMINOPHEN 10 MG/ML IV SOLN
INTRAVENOUS | Status: AC
  Filled 2020-10-10: qty 100

## 2020-10-10 MED ORDER — LIDOCAINE HCL (PF) 2 % IJ SOLN
INTRAMUSCULAR | Status: AC
  Filled 2020-10-10: qty 5

## 2020-10-10 SURGICAL SUPPLY — 54 items
ATTUNE PSFEM LTSZ5 NARCEM KNEE (Femur) ×1 IMPLANT
ATTUNE PSRP INSR SZ5 8 KNEE (Insert) ×1 IMPLANT
BAG SPEC THK2 15X12 ZIP CLS (MISCELLANEOUS) ×1
BAG ZIPLOCK 12X15 (MISCELLANEOUS) ×2 IMPLANT
BASEPLATE TIBIAL ROTATING SZ 4 (Knees) ×1 IMPLANT
BLADE SAG 18X100X1.27 (BLADE) ×2 IMPLANT
BLADE SAW SGTL 11.0X1.19X90.0M (BLADE) ×2 IMPLANT
BLADE SURG SZ10 CARB STEEL (BLADE) ×4 IMPLANT
BNDG ELASTIC 6X5.8 VLCR STR LF (GAUZE/BANDAGES/DRESSINGS) ×2 IMPLANT
BOWL SMART MIX CTS (DISPOSABLE) ×2 IMPLANT
BSPLAT TIB 4 CMNT ROT PLAT STR (Knees) ×1 IMPLANT
CEMENT HV SMART SET (Cement) ×4 IMPLANT
COVER SURGICAL LIGHT HANDLE (MISCELLANEOUS) ×2 IMPLANT
COVER WAND RF STERILE (DRAPES) IMPLANT
CUFF TOURN SGL QUICK 34 (TOURNIQUET CUFF) ×2
CUFF TRNQT CYL 34X4.125X (TOURNIQUET CUFF) ×1 IMPLANT
DECANTER SPIKE VIAL GLASS SM (MISCELLANEOUS) ×2 IMPLANT
DRAPE U-SHAPE 47X51 STRL (DRAPES) ×2 IMPLANT
DRESSING AQUACEL AG SP 3.5X10 (GAUZE/BANDAGES/DRESSINGS) IMPLANT
DRSG AQUACEL AG ADV 3.5X10 (GAUZE/BANDAGES/DRESSINGS) ×2 IMPLANT
DRSG AQUACEL AG SP 3.5X10 (GAUZE/BANDAGES/DRESSINGS) ×2
DURAPREP 26ML APPLICATOR (WOUND CARE) ×2 IMPLANT
ELECT REM PT RETURN 15FT ADLT (MISCELLANEOUS) ×2 IMPLANT
GLOVE BIO SURGEON STRL SZ7 (GLOVE) ×2 IMPLANT
GLOVE BIO SURGEON STRL SZ8 (GLOVE) ×2 IMPLANT
GLOVE BIOGEL PI IND STRL 7.0 (GLOVE) ×1 IMPLANT
GLOVE BIOGEL PI IND STRL 8 (GLOVE) ×1 IMPLANT
GLOVE BIOGEL PI INDICATOR 7.0 (GLOVE) ×1
GLOVE BIOGEL PI INDICATOR 8 (GLOVE) ×1
GOWN STRL REUS W/TWL LRG LVL3 (GOWN DISPOSABLE) ×4 IMPLANT
HANDPIECE INTERPULSE COAX TIP (DISPOSABLE) ×2
HOLDER FOLEY CATH W/STRAP (MISCELLANEOUS) ×1 IMPLANT
IMMOBILIZER KNEE 20 (SOFTGOODS) ×2
IMMOBILIZER KNEE 20 THIGH 36 (SOFTGOODS) ×1 IMPLANT
KIT TURNOVER KIT A (KITS) IMPLANT
MANIFOLD NEPTUNE II (INSTRUMENTS) ×2 IMPLANT
NS IRRIG 1000ML POUR BTL (IV SOLUTION) ×2 IMPLANT
PACK TOTAL KNEE CUSTOM (KITS) ×2 IMPLANT
PADDING CAST COTTON 6X4 STRL (CAST SUPPLIES) ×3 IMPLANT
PATELLA MEDIAL ATTUN 35MM KNEE (Knees) ×1 IMPLANT
PENCIL SMOKE EVACUATOR (MISCELLANEOUS) ×2 IMPLANT
PIN DRILL FIX HALF THREAD (BIT) ×1 IMPLANT
PIN STEINMAN FIXATION KNEE (PIN) ×1 IMPLANT
PROTECTOR NERVE ULNAR (MISCELLANEOUS) ×2 IMPLANT
SET HNDPC FAN SPRY TIP SCT (DISPOSABLE) ×1 IMPLANT
STRIP CLOSURE SKIN 1/2X4 (GAUZE/BANDAGES/DRESSINGS) ×4 IMPLANT
SUT MNCRL AB 4-0 PS2 18 (SUTURE) ×2 IMPLANT
SUT STRATAFIX 0 PDS 27 VIOLET (SUTURE) ×2
SUT VIC AB 2-0 CT1 27 (SUTURE) ×6
SUT VIC AB 2-0 CT1 TAPERPNT 27 (SUTURE) ×3 IMPLANT
SUTURE STRATFX 0 PDS 27 VIOLET (SUTURE) ×1 IMPLANT
TRAY FOLEY MTR SLVR 14FR STAT (SET/KITS/TRAYS/PACK) ×1 IMPLANT
WATER STERILE IRR 1000ML POUR (IV SOLUTION) ×4 IMPLANT
WRAP KNEE MAXI GEL POST OP (GAUZE/BANDAGES/DRESSINGS) ×2 IMPLANT

## 2020-10-10 NOTE — Anesthesia Postprocedure Evaluation (Signed)
Anesthesia Post Note  Patient: BRYANNE RIQUELME  Procedure(s) Performed: TOTAL KNEE ARTHROPLASTY (Left Knee)     Patient location during evaluation: PACU Anesthesia Type: MAC, Spinal and Regional Level of consciousness: oriented and awake and alert Pain management: pain level controlled Vital Signs Assessment: post-procedure vital signs reviewed and stable Respiratory status: spontaneous breathing and respiratory function stable Cardiovascular status: blood pressure returned to baseline and stable Postop Assessment: no headache, no backache, no apparent nausea or vomiting, spinal receding and patient able to bend at knees Anesthetic complications: no   No complications documented.  Last Vitals:  Vitals:   10/10/20 1545 10/10/20 1600  BP: 140/79 (!) 148/71  Pulse: 68 65  Resp: 14 11  Temp:    SpO2: 100% 99%    Last Pain:  Vitals:   10/10/20 1545  TempSrc:   PainSc: 0-No pain                 Naima Veldhuizen,W. EDMOND

## 2020-10-10 NOTE — Transfer of Care (Signed)
Immediate Anesthesia Transfer of Care Note  Patient: Paula Murphy  Procedure(s) Performed: TOTAL KNEE ARTHROPLASTY (Left Knee)  Patient Location: PACU  Anesthesia Type:Spinal  Level of Consciousness: awake, alert , oriented and patient cooperative  Airway & Oxygen Therapy: Patient Spontanous Breathing and Patient connected to face mask oxygen  Post-op Assessment: Report given to RN and Post -op Vital signs reviewed and stable  Post vital signs: Reviewed and stable  Last Vitals:  Vitals Value Taken Time  BP 116/63 10/10/20 1515  Temp    Pulse 71 10/10/20 1517  Resp 16 10/10/20 1517  SpO2 100 % 10/10/20 1517  Vitals shown include unvalidated device data.  Last Pain:  Vitals:   10/10/20 1025  TempSrc: Oral      Patients Stated Pain Goal: 4 (48/54/62 7035)  Complications: No complications documented.

## 2020-10-10 NOTE — Anesthesia Procedure Notes (Signed)
Spinal  Patient location during procedure: OR Start time: 10/10/2020 1:10 PM End time: 10/10/2020 1:15 PM Staffing Performed: anesthesiologist  Anesthesiologist: Roderic Palau, MD Preanesthetic Checklist Completed: patient identified, IV checked, risks and benefits discussed, surgical consent, monitors and equipment checked, pre-op evaluation and timeout performed Spinal Block Patient position: sitting Prep: DuraPrep Patient monitoring: cardiac monitor, continuous pulse ox and blood pressure Approach: midline Location: L3-4 Injection technique: single-shot Needle Needle type: Pencan  Needle gauge: 24 G Needle length: 9 cm Assessment Sensory level: T8 Additional Notes Functioning IV was confirmed and monitors were applied. Sterile prep and drape, including hand hygiene and sterile gloves were used. The patient was positioned and the spine was prepped. The skin was anesthetized with lidocaine.  Free flow of clear CSF was obtained prior to injecting local anesthetic into the CSF.  The spinal needle aspirated freely following injection.  The needle was carefully withdrawn.  The patient tolerated the procedure well.

## 2020-10-10 NOTE — Op Note (Signed)
OPERATIVE REPORT-TOTAL KNEE ARTHROPLASTY   Pre-operative diagnosis- Osteoarthritis  Left knee(s)  Post-operative diagnosis- Osteoarthritis Left knee(s)  Procedure-  Left  Total Knee Arthroplasty  Surgeon- Dione Plover. Vanette Noguchi, MD  Assistant- Molli Barrows, PA-C   Anesthesia-  Adductor canal block and spinal  EBL-25 mL   Drains None  Tourniquet time- 30 minutes @ 035 mmHg  Complications- None  Condition-PACU - hemodynamically stable.   Brief Clinical Note  Paula Murphy is a 74 y.o. year old female with end stage OA of her left knee with progressively worsening pain and dysfunction. She has constant pain, with activity and at rest and significant functional deficits with difficulties even with ADLs. She has had extensive non-op management including analgesics, injections of cortisone and viscosupplements, and home exercise program, but remains in significant pain with significant dysfunction. Radiographs show bone on bone arthritis medial and patellofemoral. She presents now for left Total Knee Arthroplasty.     Procedure in detail---   The patient is brought into the operating room and positioned supine on the operating table. After successful administration of  Adductor canal block and spinal,   a tourniquet is placed high on the  Left thigh(s) and the lower extremity is prepped and draped in the usual sterile fashion. Time out is performed by the operating team and then the  Left lower extremity is wrapped in Esmarch, knee flexed and the tourniquet inflated to 300 mmHg.       A midline incision is made with a ten blade through the subcutaneous tissue to the level of the extensor mechanism. A fresh blade is used to make a medial parapatellar arthrotomy. Soft tissue over the proximal medial tibia is subperiosteally elevated to the joint line with a knife and into the semimembranosus bursa with a Cobb elevator. Soft tissue over the proximal lateral tibia is elevated with attention  being paid to avoiding the patellar tendon on the tibial tubercle. The patella is everted, knee flexed 90 degrees and the ACL and PCL are removed. Findings are bone on bone medial and patellofemoral with large global osteophytes.        The drill is used to create a starting hole in the distal femur and the canal is thoroughly irrigated with sterile saline to remove the fatty contents. The 5 degree Left  valgus alignment guide is placed into the femoral canal and the distal femoral cutting block is pinned to remove 9 mm off the distal femur. Resection is made with an oscillating saw.      The tibia is subluxed forward and the menisci are removed. The extramedullary alignment guide is placed referencing proximally at the medial aspect of the tibial tubercle and distally along the second metatarsal axis and tibial crest. The block is pinned to remove 28mm off the more deficient medial  side. Resection is made with an oscillating saw. Size 4is the most appropriate size for the tibia and the proximal tibia is prepared with the modular drill and keel punch for that size.      The femoral sizing guide is placed and size 5 is most appropriate. Rotation is marked off the epicondylar axis and confirmed by creating a rectangular flexion gap at 90 degrees. The size 5 cutting block is pinned in this rotation and the anterior, posterior and chamfer cuts are made with the oscillating saw. The intercondylar block is then placed and that cut is made.      Trial size 4 tibial component, trial size 5 narrow  posterior stabilized femur and a 8  mm posterior stabilized rotating platform insert trial is placed. Full extension is achieved with excellent varus/valgus and anterior/posterior balance throughout full range of motion. The patella is everted and thickness measured to be 22  mm. Free hand resection is taken to 12 mm, a 35 template is placed, lug holes are drilled, trial patella is placed, and it tracks normally. Osteophytes  are removed off the posterior femur with the trial in place. All trials are removed and the cut bone surfaces prepared with pulsatile lavage. Cement is mixed and once ready for implantation, the size 4 tibial implant, size  5 narrow posterior stabilized femoral component, and the size 35 patella are cemented in place and the patella is held with the clamp. The trial insert is placed and the knee held in full extension. The Exparel (20 ml mixed with 60 ml saline) is injected into the extensor mechanism, posterior capsule, medial and lateral gutters and subcutaneous tissues.  All extruded cement is removed and once the cement is hard the permanent 8 mm posterior stabilized rotating platform insert is placed into the tibial tray.      The wound is copiously irrigated with saline solution and the extensor mechanism closed with # 0 Stratofix suture. The tourniquet is released for a total tourniquet time of 30  minutes. Flexion against gravity is 140 degrees and the patella tracks normally. Subcutaneous tissue is closed with 2.0 vicryl and subcuticular with running 4.0 Monocryl. The incision is cleaned and dried and steri-strips and a bulky sterile dressing are applied. The limb is placed into a knee immobilizer and the patient is awakened and transported to recovery in stable condition.      Please note that a surgical assistant was a medical necessity for this procedure in order to perform it in a safe and expeditious manner. Surgical assistant was necessary to retract the ligaments and vital neurovascular structures to prevent injury to them and also necessary for proper positioning of the limb to allow for anatomic placement of the prosthesis.   Dione Plover Nunzio Banet, MD    10/10/2020, 2:17 PM

## 2020-10-10 NOTE — Anesthesia Procedure Notes (Signed)
Anesthesia Regional Block: Adductor canal block   Pre-Anesthetic Checklist: ,, timeout performed, Correct Patient, Correct Site, Correct Laterality, Correct Procedure, Correct Position, site marked, Risks and benefits discussed, pre-op evaluation,  At surgeon's request and post-op pain management  Laterality: Left  Prep: Maximum Sterile Barrier Precautions used, chloraprep       Needles:  Injection technique: Single-shot  Needle Type: Echogenic Stimulator Needle     Needle Length: 9cm  Needle Gauge: 21     Additional Needles:   Procedures:,,,, ultrasound used (permanent image in chart),,,,  Narrative:  Start time: 10/10/2020 12:17 PM End time: 10/10/2020 12:27 PM Injection made incrementally with aspirations every 5 mL.  Performed by: Personally  Anesthesiologist: Roderic Palau, MD  Additional Notes: 2% Lidocaine skin wheel.

## 2020-10-10 NOTE — Evaluation (Signed)
Physical Therapy Evaluation Patient Details Name: Paula Murphy MRN: [id number] DOB: [birth year] Today's Date: 10/10/2020   History of Present Illness  Patient is 74 y.o. female s/p Lt TKA on 10/10/20 with PMH significant for HLD, hypothyroidism, fibromyalgia, macular degeneration, OA, Rt TKA in 2014.    Clinical Impression  JAVEN RIDINGS is a 74 y.o. female POD 0 s/p Lt TKA. Patient reports independence with mobility at baseline. Patient is now limited by functional impairments (see PT problem list below) and requires min assist for transfers and gait with RW. Patient was able to ambulate ~40 feet with RW and min assist. Patient instructed in exercise to facilitate circulation. Patient will benefit from continued skilled PT interventions to address impairments and progress towards PLOF. Acute PT will follow to progress mobility and stair training in preparation for safe discharge home.     Follow Up Recommendations Follow surgeon's recommendation for DC plan and follow-up therapies    Equipment Recommendations  Rolling walker with 5" wheels    Recommendations for Other Services       Precautions / Restrictions Precautions Precautions: Fall Restrictions Weight Bearing Restrictions: No Other Position/Activity Restrictions: WBAT      Mobility  Bed Mobility Overal bed mobility: Needs Assistance Bed Mobility: Supine to Sit     Supine to sit: Min assist;HOB elevated     General bed mobility comments: cues for use of bed rail, light assist to bring Lt LE off EOB.    Transfers Overall transfer level: Needs assistance Equipment used: Rolling walker (2 wheeled) Transfers: Sit to/from Stand Sit to Stand: Min assist         General transfer comment: cues for technique, assist for power up to RW. pt with slight anterior lean in standing.  Ambulation/Gait Ambulation/Gait assistance: Min assist Gait Distance (Feet): 40 Feet Assistive device: Rolling walker (2 wheeled) Gait  Pattern/deviations: Step-to pattern;Decreased stride length;Decreased weight shift to left Gait velocity: decr   General Gait Details: cues for step pattern and proximity to RW, min assist throughout to steady secondary to weak hip extensors.   Stairs            Wheelchair Mobility    Modified Rankin (Stroke Patients Only)       Balance Overall balance assessment: Needs assistance Sitting-balance support: Feet supported Sitting balance-Leahy Scale: Good     Standing balance support: During functional activity;Bilateral upper extremity supported Standing balance-Leahy Scale: Poor                               Pertinent Vitals/Pain Pain Assessment: 0-10 Pain Score: 3  Pain Location: Lt knee Pain Descriptors / Indicators: Aching;Discomfort Pain Intervention(s): Limited activity within patient's tolerance;Monitored during session;Repositioned;Ice applied    Home Living Family/patient expects to be discharged to:: Private residence Living Arrangements: Alone Available Help at Discharge: Family Type of Home: House Home Access: Stairs to enter Entrance Stairs-Rails: Left Entrance Stairs-Number of Steps: 1+2 Home Layout: Two level;Bed/bath upstairs;1/2 bath on main level;Able to live on main level with bedroom/bathroom Home Equipment: Gilford Rile - 2 wheels;Cane - single point;Bedside commode;Shower seat      Prior Function Level of Independence: Independent               Hand Dominance   Dominant Hand: Right    Extremity/Trunk Assessment   Upper Extremity Assessment Upper Extremity Assessment: Overall WFL for tasks assessed    Lower Extremity Assessment Lower Extremity Assessment: LLE  deficits/detail LLE Deficits / Details: no extensor lag with SLR, 3/5 LLE Sensation: WNL LLE Coordination: WNL    Cervical / Trunk Assessment Cervical / Trunk Assessment: Normal  Communication   Communication: No difficulties  Cognition Arousal/Alertness:  Awake/alert Behavior During Therapy: WFL for tasks assessed/performed Overall Cognitive Status: Within Functional Limits for tasks assessed                                        General Comments      Exercises Total Joint Exercises Ankle Circles/Pumps: Both;AROM;20 reps;Seated   Assessment/Plan    PT Assessment Patient needs continued PT services  PT Problem List Decreased range of motion;Decreased strength;Decreased activity tolerance;Decreased balance;Decreased mobility;Decreased knowledge of use of DME;Decreased knowledge of precautions;Pain       PT Treatment Interventions DME instruction;Gait training;Stair training;Functional mobility training;Therapeutic activities;Therapeutic exercise;Balance training;Patient/family education    PT Goals (Current goals can be found in the Care Plan section)  Acute Rehab PT Goals Patient Stated Goal: get to the beach PT Goal Formulation: With patient Time For Goal Achievement: 10/17/20 Potential to Achieve Goals: Good    Frequency 7X/week   Barriers to discharge        Co-evaluation               AM-PAC PT "6 Clicks" Mobility  Outcome Measure Help needed turning from your back to your side while in a flat bed without using bedrails?: A Little Help needed moving from lying on your back to sitting on the side of a flat bed without using bedrails?: A Little Help needed moving to and from a bed to a chair (including a wheelchair)?: A Little Help needed standing up from a chair using your arms (e.g., wheelchair or bedside chair)?: A Little Help needed to walk in hospital room?: A Little Help needed climbing 3-5 steps with a railing? : A Lot 6 Click Score: 17    End of Session Equipment Utilized During Treatment: Gait belt Activity Tolerance: Patient tolerated treatment well Patient left: in chair;with call bell/phone within reach;with chair alarm set;with family/visitor present Nurse Communication: Mobility  status PT Visit Diagnosis: Muscle weakness (generalized) (M62.81);Difficulty in walking, not elsewhere classified (R26.2)    Time: 6294-7654 PT Time Calculation (min) (ACUTE ONLY): 29 min   Charges:   PT Evaluation $PT Eval Low Complexity: 1 Low PT Treatments $Gait Training: 8-22 mins        Verner Mould, DPT Acute Rehabilitation Services  Office [phone] Pager [phone]  10/10/2020 7:28 PM

## 2020-10-10 NOTE — Interval H&P Note (Signed)
History and Physical Interval Note:  10/10/2020 10:49 AM  Paula Murphy  has presented today for surgery, with the diagnosis of Left knee osteoarthritis.  The various methods of treatment have been discussed with the patient and family. After consideration of risks, benefits and other options for treatment, the patient has consented to  Procedure(s) with comments: TOTAL KNEE ARTHROPLASTY (Left) - 43min as a surgical intervention.  The patient's history has been reviewed, patient examined, no change in status, stable for surgery.  I have reviewed the patient's chart and labs.  Questions were answered to the patient's satisfaction.     Pilar Plate Cordale Manera

## 2020-10-10 NOTE — Discharge Instructions (Signed)
 Frank Aluisio, MD Total Joint Specialist [facility] [place] [address] [place], [place] [address] [phone]  TOTAL KNEE REPLACEMENT POSTOPERATIVE DIRECTIONS    Knee Rehabilitation, Guidelines Following Surgery  Results after knee surgery are often greatly improved when you follow the exercise, range of motion and muscle strengthening exercises prescribed by your doctor. Safety measures are also important to protect the knee from further injury. If any of these exercises cause you to have increased pain or swelling in your knee joint, decrease the amount until you are comfortable again and slowly increase them. If you have problems or questions, call your caregiver or physical therapist for advice.   BLOOD CLOT PREVENTION . Take a 325 mg Aspirin two times a day for three weeks following surgery. Then resume one 81 mg Aspirin once a day. . You may resume your vitamins/supplements upon discharge from the hospital. . Do not take any NSAIDs (Advil, Aleve, Ibuprofen, Meloxicam, etc.) until you have discontinued the 325 mg Aspirin.  HOME CARE INSTRUCTIONS  . Remove items at home which could result in a fall. This includes throw rugs or furniture in walking pathways.  . ICE to the affected knee as much as tolerated. Icing helps control swelling. If the swelling is well controlled you will be more comfortable and rehab easier. Continue to use ice on the knee for pain and swelling from surgery. You may notice swelling that will progress down to the foot and ankle. This is normal after surgery. Elevate the leg when you are not up walking on it.    . Continue to use the breathing machine which will help keep your temperature down. It is common for your temperature to cycle up and down following surgery, especially at night when you are not up moving around and exerting yourself. The breathing machine keeps your lungs expanded and your temperature down. . Do not place pillow  under the operative knee, focus on keeping the knee straight while resting  DIET You may resume your previous home diet once you are discharged from the hospital.  DRESSING / WOUND CARE / SHOWERING . Keep your bulky bandage on for 2 days. On the third post-operative day you may remove the Ace bandage and gauze. There is a waterproof adhesive bandage on your skin which will stay in place until your first follow-up appointment. Once you remove this you will not need to place another bandage . You may begin showering 3 days following surgery, but do not submerge the incision under water.  ACTIVITY For the first 5 days, the key is rest and control of pain and swelling . Do your home exercises twice a day starting on post-operative day 3. On the days you go to physical therapy, just do the home exercises once that day. . You should rest, ice and elevate the leg for 50 minutes out of every hour. Get up and walk/stretch for 10 minutes per hour. After 5 days you can increase your activity slowly as tolerated. . Walk with your walker as instructed. Use the walker until you are comfortable transitioning to a cane. Walk with the cane in the opposite hand of the operative leg. You may discontinue the cane once you are comfortable and walking steadily. . Avoid periods of inactivity such as sitting longer than an hour when not asleep. This helps prevent blood clots.  . You may discontinue the knee immobilizer once you are able to perform a straight leg raise while lying down. .   You may resume a sexual relationship in one month or when given the OK by your doctor.  . You may return to work once you are cleared by your doctor.  . Do not drive a car for 6 weeks or until released by your surgeon.  . Do not drive while taking narcotics.  TED HOSE STOCKINGS Wear the elastic stockings on both legs for three weeks following surgery during the day. You may remove them at night for sleeping.  WEIGHT BEARING Weight  bearing as tolerated with assist device (walker, cane, etc) as directed, use it as long as suggested by your surgeon or therapist, typically at least 4-6 weeks.  POSTOPERATIVE CONSTIPATION PROTOCOL Constipation - defined medically as fewer than three stools per week and severe constipation as less than one stool per week.  One of the most common issues patients have following surgery is constipation.  Even if you have a regular bowel pattern at home, your normal regimen is likely to be disrupted due to multiple reasons following surgery.  Combination of anesthesia, postoperative narcotics, change in appetite and fluid intake all can affect your bowels.  In order to avoid complications following surgery, here are some recommendations in order to help you during your recovery period.  . Colace (docusate) - Pick up an over-the-counter form of Colace or another stool softener and take twice a day as long as you are requiring postoperative pain medications.  Take with a full glass of water daily.  If you experience loose stools or diarrhea, hold the colace until you stool forms back up. If your symptoms do not get better within 1 week or if they get worse, check with your doctor. . Dulcolax (bisacodyl) - Pick up over-the-counter and take as directed by the product packaging as needed to assist with the movement of your bowels.  Take with a full glass of water.  Use this product as needed if not relieved by Colace only.  . MiraLax (polyethylene glycol) - Pick up over-the-counter to have on hand. MiraLax is a solution that will increase the amount of water in your bowels to assist with bowel movements.  Take as directed and can mix with a glass of water, juice, soda, coffee, or tea. Take if you go more than two days without a movement. Do not use MiraLax more than once per day. Call your doctor if you are still constipated or irregular after using this medication for 7 days in a row.  If you continue to have  problems with postoperative constipation, please contact the office for further assistance and recommendations.  If you experience "the worst abdominal pain ever" or develop nausea or vomiting, please contact the office immediatly for further recommendations for treatment.  ITCHING If you experience itching with your medications, try taking only a single pain pill, or even half a pain pill at a time.  You can also use Benadryl over the counter for itching or also to help with sleep.   MEDICATIONS See your medication summary on the "After Visit Summary" that the nursing staff will review with you prior to discharge.  You may have some home medications which will be placed on hold until you complete the course of blood thinner medication.  It is important for you to complete the blood thinner medication as prescribed by your surgeon.  Continue your approved medications as instructed at time of discharge.  PRECAUTIONS . If you experience chest pain or shortness of breath - call 911 immediately for   transfer to the hospital emergency department.  . If you develop a fever greater that 101 F, purulent drainage from wound, increased redness or drainage from wound, foul odor from the wound/dressing, or calf pain - CONTACT YOUR SURGEON.                                                   FOLLOW-UP APPOINTMENTS Make sure you keep all of your appointments after your operation with your surgeon and caregivers. You should call the office at the above phone number and make an appointment for approximately two weeks after the date of your surgery or on the date instructed by your surgeon outlined in the "After Visit Summary".  RANGE OF MOTION AND STRENGTHENING EXERCISES  Rehabilitation of the knee is important following a knee injury or an operation. After just a few days of immobilization, the muscles of the thigh which control the knee become weakened and shrink (atrophy). Knee exercises are designed to build up the  tone and strength of the thigh muscles and to improve knee motion. Often times heat used for twenty to thirty minutes before working out will loosen up your tissues and help with improving the range of motion but do not use heat for the first two weeks following surgery. These exercises can be done on a training (exercise) mat, on the floor, on a table or on a bed. Use what ever works the best and is most comfortable for you Knee exercises include:  . Leg Lifts - While your knee is still immobilized in a splint or cast, you can do straight leg raises. Lift the leg to 60 degrees, hold for 3 sec, and slowly lower the leg. Repeat 10-20 times 2-3 times daily. Perform this exercise against resistance later as your knee gets better.  . Quad and Hamstring Sets - Tighten up the muscle on the front of the thigh (Quad) and hold for 5-10 sec. Repeat this 10-20 times hourly. Hamstring sets are done by pushing the foot backward against an object and holding for 5-10 sec. Repeat as with quad sets.   Leg Slides: Lying on your back, slowly slide your foot toward your buttocks, bending your knee up off the floor (only go as far as is comfortable). Then slowly slide your foot back down until your leg is flat on the floor again.  Angel Wings: Lying on your back spread your legs to the side as far apart as you can without causing discomfort.  A rehabilitation program following serious knee injuries can speed recovery and prevent re-injury in the future due to weakened muscles. Contact your doctor or a physical therapist for more information on knee rehabilitation.   IF YOU ARE TRANSFERRED TO A SKILLED REHAB FACILITY If the patient is transferred to a skilled rehab facility following release from the hospital, a list of the current medications will be sent to the facility for the patient to continue.  When discharged from the skilled rehab facility, please have the facility set up the patient's Home Health Physical Therapy  prior to being released. Also, the skilled facility will be responsible for providing the patient with their medications at time of release from the facility to include their pain medication, the muscle relaxants, and their blood thinner medication. If the patient is still at the rehab facility at time of the   two week follow up appointment, the skilled rehab facility will also need to assist the patient in arranging follow up appointment in our office and any transportation needs.  MAKE SURE YOU:  . Understand these instructions.  . Get help right away if you are not doing well or get worse.   DENTAL ANTIBIOTICS:  In most cases prophylactic antibiotics for Dental procdeures after total joint surgery are not necessary.  Exceptions are as follows:  1. History of prior total joint infection  2. Severely immunocompromised (Organ Transplant, cancer chemotherapy, Rheumatoid biologic meds such as Humera)  3. Poorly controlled diabetes (A1C &gt; 8.0, blood glucose over 200)  If you have one of these conditions, contact your surgeon for an antibiotic prescription, prior to your dental procedure.    Pick up stool softner and laxative for home use following surgery while on pain medications. Do not submerge incision under water. Please use good hand washing techniques while changing dressing each day. May shower starting three days after surgery. Please use a clean towel to pat the incision dry following showers. Continue to use ice for pain and swelling after surgery. Do not use any lotions or creams on the incision until instructed by your surgeon.  

## 2020-10-10 NOTE — Anesthesia Preprocedure Evaluation (Addendum)
Anesthesia Evaluation  Patient identified by MRN, date of birth, ID band Patient awake    Reviewed: Allergy & Precautions, H&P , NPO status , Patient's Chart, lab work & pertinent test results  History of Anesthesia Complications (+) PONV  Airway Mallampati: II  TM Distance: >3 FB Neck ROM: Full    Dental no notable dental hx. (+) Edentulous Upper, Dental Advisory Given   Pulmonary neg pulmonary ROS,    Pulmonary exam normal breath sounds clear to auscultation       Cardiovascular negative cardio ROS   Rhythm:Regular Rate:Normal     Neuro/Psych  Headaches, Depression    GI/Hepatic Neg liver ROS, GERD  Medicated,  Endo/Other  Hypothyroidism   Renal/GU negative Renal ROS  negative genitourinary   Musculoskeletal  (+) Arthritis , Osteoarthritis,  Fibromyalgia -  Abdominal   Peds  Hematology negative hematology ROS (+)   Anesthesia Other Findings   Reproductive/Obstetrics negative OB ROS                            Anesthesia Physical Anesthesia Plan  ASA: II  Anesthesia Plan: Spinal and MAC   Post-op Pain Management:  Regional for Post-op pain   Induction: Intravenous  PONV Risk Score and Plan: 4 or greater and Ondansetron, Dexamethasone, Propofol infusion and Midazolam  Airway Management Planned: Simple Face Mask  Additional Equipment:   Intra-op Plan:   Post-operative Plan:   Informed Consent: I have reviewed the patients History and Physical, chart, labs and discussed the procedure including the risks, benefits and alternatives for the proposed anesthesia with the patient or authorized representative who has indicated his/her understanding and acceptance.     Dental advisory given  Plan Discussed with: CRNA  Anesthesia Plan Comments:         Anesthesia Quick Evaluation

## 2020-10-10 NOTE — Progress Notes (Signed)
Assisted Dr. Edmond Fitzgerald with left, ultrasound guided, adductor canal block. Side rails up, monitors on throughout procedure. See vital signs in flow sheet. Tolerated Procedure well. 

## 2020-10-11 ENCOUNTER — Encounter (HOSPITAL_COMMUNITY): Payer: Self-pay | Admitting: Orthopedic Surgery

## 2020-10-11 DIAGNOSIS — M1712 Unilateral primary osteoarthritis, left knee: Secondary | ICD-10-CM | POA: Diagnosis not present

## 2020-10-11 LAB — BASIC METABOLIC PANEL
Anion gap: 7 (ref 5–15)
BUN: 11 mg/dL (ref 8–23)
CO2: 22 mmol/L (ref 22–32)
Calcium: 7.7 mg/dL — ABNORMAL LOW (ref 8.9–10.3)
Chloride: 110 mmol/L (ref 98–111)
Creatinine, Ser: 0.74 mg/dL (ref 0.44–1.00)
GFR, Estimated: >60 mL/min (ref >60)
Glucose, Bld: 140 mg/dL — ABNORMAL HIGH (ref 70–99)
Potassium: 3.6 mmol/L (ref 3.5–5.1)
Sodium: 139 mmol/L (ref 135–145)

## 2020-10-11 LAB — CBC
HCT: 34.2 % — ABNORMAL LOW (ref 36.0–46.0)
Hemoglobin: 10.9 g/dL — ABNORMAL LOW (ref 12.0–15.0)
MCH: 31.1 pg (ref 26.0–34.0)
MCHC: 31.9 g/dL (ref 30.0–36.0)
MCV: 97.7 fL (ref 80.0–100.0)
Platelets: 202 10*3/uL (ref 150–400)
RBC: 3.5 MIL/uL — ABNORMAL LOW (ref 3.87–5.11)
RDW: 13.2 % (ref 11.5–15.5)
WBC: 9.4 10*3/uL (ref 4.0–10.5)
nRBC: 0 % (ref 0.0–0.2)

## 2020-10-11 MED ORDER — HYDROMORPHONE HCL 2 MG PO TABS
2.0000 mg | ORAL_TABLET | Freq: Four times a day (QID) | ORAL | 0 refills | Status: DC | PRN
Start: 2020-10-11 — End: 2020-10-11

## 2020-10-11 MED ORDER — GABAPENTIN 300 MG PO CAPS: ORAL_CAPSULE | ORAL | 0 refills | Status: DC

## 2020-10-11 MED ORDER — ONDANSETRON 4 MG PO TBDP: 4.0000 mg | ORAL_TABLET | Freq: Three times a day (TID) | ORAL | 0 refills | Status: DC | PRN

## 2020-10-11 MED ORDER — METHOCARBAMOL 500 MG PO TABS
500.0000 mg | ORAL_TABLET | Freq: Four times a day (QID) | ORAL | 0 refills | Status: DC | PRN
Start: 2020-10-11 — End: 2021-05-30

## 2020-10-11 MED ORDER — HYDROMORPHONE HCL 2 MG PO TABS
2.0000 mg | ORAL_TABLET | Freq: Four times a day (QID) | ORAL | 0 refills | Status: DC | PRN
Start: 2020-10-11 — End: 2020-11-30

## 2020-10-11 MED ORDER — ASPIRIN 325 MG PO TBEC: 325.0000 mg | DELAYED_RELEASE_TABLET | Freq: Two times a day (BID) | ORAL | 0 refills | Status: AC

## 2020-10-11 NOTE — Progress Notes (Signed)
Physical Therapy Discharge Patient Details Name: Paula Murphy MRN: [id number] DOB: February 15, 1946 Today's Date: 10/11/2020 Time: 1315-1340 PT Time Calculation (min) (ACUTE ONLY): 25 min  Patient discharged from PT services secondary to goals met and no further PT needs identified.  Please see latest therapy progress note for current level of functioning and progress toward goals.    Progress and discharge plan discussed with patient and/or caregiver: Patient/Caregiver agrees with plan  GP     [name] 10/11/2020, 1:44 PM

## 2020-10-11 NOTE — Progress Notes (Signed)
Subjective: 1 Day Post-Op Procedure(s) (LRB): TOTAL KNEE ARTHROPLASTY (Left) Patient reports pain as mild.   Patient seen in rounds by Dr. Wynelle Link. Patient is well, and has had no acute complaints or problems other than nausea. Denies chest pain, SOB, or calf pain. No issues overnight. Foley catheter removed this AM. We will continue therapy today, ambulated 40' yesterday.  Objective: Vital signs in last 24 hours: Temp:  [97.5 F (36.4 C)-99.3 F (37.4 C)] 98.4 F (36.9 C) (12/07 0405) Pulse Rate:  [63-86] 77 (12/07 0405) Resp:  [9-18] 18 (12/07 0405) BP: (106-165)/(63-92) 106/67 (12/07 0405) SpO2:  [97 %-100 %] 97 % (12/07 0405) Weight:  [74.8 kg] 74.8 kg (12/06 1038)  Intake/Output from previous day:  Intake/Output Summary (Last 24 hours) at 10/11/2020 0727 Last data filed at 10/11/2020 6767 Gross per 24 hour  Intake 3690.16 ml  Output 1775 ml  Net 1915.16 ml     Intake/Output this shift: No intake/output data recorded.  Labs: Recent Labs    10/11/20 0346  HGB 10.9*   Recent Labs    10/11/20 0346  WBC 9.4  RBC 3.50*  HCT 34.2*  PLT 202   Recent Labs    10/11/20 0346  NA 139  K 3.6  CL 110  CO2 22  BUN 11  CREATININE 0.74  GLUCOSE 140*  CALCIUM 7.7*   No results for input(s): LABPT, INR in the last 72 hours.  Exam: General - Patient is Alert and Oriented Extremity - Neurologically intact Neurovascular intact Sensation intact distally Dorsiflexion/Plantar flexion intact Dressing - dressing C/D/I Motor Function - intact, moving foot and toes well on exam.   Past Medical History:  Diagnosis Date  . Arthritis    OA AND PAIN RT KNEE  . 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 Fibromyalgia    followed by Dr. Estanislado Pandy  . Hemorrhoids   . Hyperlipidemia   . Hypothyroidism   . Macular degeneration    BOTH EYES  . PONV (postoperative nausea and vomiting)   .  Thyroid disease    Hypothyroidism    Assessment/Plan: 1 Day Post-Op Procedure(s) (LRB): TOTAL KNEE ARTHROPLASTY (Left) Principal Problem:   OA (osteoarthritis) of knee Active Problems:   Primary osteoarthritis of left knee  Estimated body mass index is 31.18 kg/m as calculated from the following:   Height as of this encounter: 5\' 1"  (1.549 m).   Weight as of this encounter: 74.8 kg. Advance diet Up with therapy D/C IV fluids   Patient's anticipated LOS is less than 2 midnights, meeting these requirements: - Lives within 1 hour of care - Has a competent adult at home to recover with post-op recover - NO history of  - Chronic pain requiring opiods  - Diabetes  - Coronary Artery Disease  - Heart failure  - Heart attack  - Stroke  - DVT/VTE  - Cardiac arrhythmia  - Respiratory Failure/COPD  - Renal failure  - Anemia  - Advanced Liver disease  DVT Prophylaxis - Aspirin Weight bearing as tolerated. Continue therapy.  Sending with dissolvable zofran script (can tolerate better than tablets).  Plan is to go Home after hospital stay. Plan for discharge once meeting goals with physical therapy. Scheduled for OPPT at [facility]. Follow-up in the office in 2 weeks.   The PDMP database was reviewed today prior to any opioid medications being prescribed to this patient.  Theresa Duty, PA-C Orthopedic Surgery [phone] 10/11/2020, 7:27 AM

## 2020-10-11 NOTE — TOC Initial Note (Signed)
Transition of Care [facility]) - Initial/Assessment Note    Patient Details  Name: Paula Murphy MRN: [id number] Date of Birth: 04-21-1946  Transition of Care [facility]) CM/SW Contact:    Joaquin Courts, RN Phone Number: 10/11/2020, 10:01 AM  Clinical Narrative:                 CM spoke with patient who confirms plan for OPPT and reports has rolling walker and 3in1 at home.  Expected Discharge Plan: Home/Self Care Barriers to Discharge: No Barriers Identified   Patient Goals and CMS Choice Patient states their goals for this hospitalization and ongoing recovery are:: to go home with outpatient physical therapy      Expected Discharge Plan and Services Expected Discharge Plan: Home/Self Care   Discharge Planning Services: CM Consult     Expected Discharge Date: 10/11/20               DME Arranged: N/A DME Agency: NA       HH Arranged: NA          Prior Living Arrangements/Services     Patient language and need for interpreter reviewed:: Yes Do you feel safe going back to the place where you live?: Yes      Need for Family Participation in Patient Care: Yes (Comment) Care giver support system in place?: Yes (comment)   Criminal Activity/Legal Involvement Pertinent to Current Situation/Hospitalization: No - Comment as needed  Activities of Daily Living Home Assistive Devices/Equipment: Dentures (specify type), Eyeglasses, Shower chair with back, Raised toilet seat with rails, Cane (specify quad or straight), Walker (specify type) ADL Screening (condition at time of admission) Patient's cognitive ability adequate to safely complete daily activities?: Yes Is the patient deaf or have difficulty hearing?: No Does the patient have difficulty seeing, even when wearing glasses/contacts?: No Does the patient have difficulty concentrating, remembering, or making decisions?: No Patient able to express need for assistance with ADLs?: Yes Does the patient have difficulty  dressing or bathing?: No Independently performs ADLs?: Yes (appropriate for developmental age) Does the patient have difficulty walking or climbing stairs?: No Weakness of Legs: Left Weakness of Arms/Hands: None  Permission Sought/Granted                  Emotional Assessment Appearance:: Appears stated age Attitude/Demeanor/Rapport: Engaged Affect (typically observed): Accepting Orientation: : Oriented to Self, Oriented to Place, Oriented to  Time, Oriented to Situation   Psych Involvement: No (comment)  Admission diagnosis:  Primary osteoarthritis of left knee [M17.12] Patient Active Problem List   Diagnosis Date Noted  . Primary osteoarthritis of left knee 10/10/2020  . Ingrown toenail 02/02/2019  . Primary osteoarthritis of both feet 03/21/2017  . History of macular degeneration 03/21/2017  . Other fatigue 03/21/2017  . Age-related osteoporosis without current pathological fracture 03/21/2017  . Esophageal reflux 06/29/2013  . Actinic keratosis 04/11/2013  . Systemic lupus erythematosus ([place]) 04/11/2013  . OA (osteoarthritis) of knee 03/02/2013  . DEPRESSION 07/24/2010  . Major depressive disorder, single episode, unspecified 07/24/2010  . UTI 07/04/2010  . ABDOMINAL BLOATING 01/20/2010  . Fibromyalgia 10/04/2009  . INSOMNIA, CHRONIC 10/04/2009  . Autoimmune disease ([place]) 10/04/2009  . Vitamin D deficiency 10/26/2008  . HYPOTHYROIDISM 08/13/2008  . HYPERLIPIDEMIA 08/13/2008  . HYPERTENSION 08/13/2008  . HEADACHE 08/13/2008  . Muscle pain 08/12/2008   PCP:  Arlyss Repress, MD Pharmacy:   [facility] [phone] - [place], [address]  [place] [address] Phone: [phone] Fax: [place], [place], [address] [place], [place] [address] Phone: [phone] Fax: [phone]     Social Determinants  of Health (SDOH) Interventions    Readmission Risk Interventions No flowsheet data found.

## 2020-10-11 NOTE — Progress Notes (Signed)
Physical Therapy Treatment Patient Details Name: Paula Murphy MRN: [id number] DOB: 1945/12/20 Today's Date: 10/11/2020    History of Present Illness Patient is 74 y.o. female s/p Lt TKA on 10/10/20 with PMH significant for HLD, hypothyroidism, fibromyalgia, macular degeneration, OA, Rt TKA in 2014.    PT Comments    Pt felt much better this afternoon and completed stair training and mobilized with RW 150 ft. Pt and daughter demonstrated clear understanding of safety with RW and educated on HEP. Pt has met all PT goals for mobility and is ready for d/c home.   Follow Up Recommendations  Follow surgeon's recommendation for DC plan and follow-up therapies     Equipment Recommendations  Rolling walker with 5" wheels    Recommendations for Other Services       Precautions / Restrictions Precautions Precautions: Fall Restrictions Other Position/Activity Restrictions: WBAT    Mobility  Bed Mobility Overal bed mobility: Modified Independent Bed Mobility: Supine to Sit     Supine to sit: HOB elevated        Transfers Overall transfer level: Modified independent Equipment used: Rolling walker (2 wheeled) Transfers: Sit to/from Stand Sit to Stand: Modified independent (Device/Increase time)         General transfer comment: cues for hand placement for improved safety with RW  Ambulation/Gait Ambulation/Gait assistance: Supervision Gait Distance (Feet): 150 Feet Assistive device: Rolling walker (2 wheeled) Gait Pattern/deviations: Step-through pattern;Decreased stride length Gait velocity: decr   General Gait Details: pt reported feeling dizzy, recliner brough over by nursing tech and pt returned to room. Pt reported she had just taken a bunch of medication and gets nauseated if she doesn't eat right when she takes midication. Pt given graham crackers and felt better.   Stairs Stairs: Yes Stairs assistance: Min guard Stair Management: One rail  Left;Sideways Number of Stairs: 4 General stair comments: daughter was present and verbalized understanding of technique   Wheelchair Mobility    Modified Rankin (Stroke Patients Only)       Balance Overall balance assessment: Needs assistance Sitting-balance support: Feet supported Sitting balance-Leahy Scale: Normal     Standing balance support: Bilateral upper extremity supported;During functional activity Standing balance-Leahy Scale: Fair                              Cognition Arousal/Alertness: Awake/alert Behavior During Therapy: WFL for tasks assessed/performed Overall Cognitive Status: Within Functional Limits for tasks assessed                                        Exercises Total Joint Exercises Ankle Circles/Pumps: Both;AROM;20 reps;Supine Quad Sets: AROM;Strengthening;Left;10 reps;Supine Heel Slides: AROM;Strengthening;Left;10 reps;Supine Hip ABduction/ADduction: AROM;Strengthening;Left;10 reps;Supine Straight Leg Raises: AROM;Strengthening;Left;10 reps;Supine Long Arc Quad: AROM;Strengthening;Left;10 reps;Supine Knee Flexion: AROM;Strengthening;Left;10 reps;Seated Goniometric ROM: 90*    General Comments General comments (skin integrity, edema, etc.): Pt felt better this afternoon and verbalized she wanted to d/c home. Pt's daughter was present and will assist pt at home with mobility as needed.      Pertinent Vitals/Pain Pain Assessment: 0-10 Pain Score: 3  Pain Location: Lt knee Pain Descriptors / Indicators: Aching;Discomfort Pain Intervention(s): Limited activity within patient's tolerance;Repositioned;Monitored during session    Home Living                      Prior Function  PT Goals (current goals can now be found in the care plan section) Progress towards PT goals: Goals met/education completed, patient discharged from PT    Frequency    7X/week      PT Plan Current plan remains  appropriate    Co-evaluation              AM-PAC PT "6 Clicks" Mobility   Outcome Measure  Help needed turning from your back to your side while in a flat bed without using bedrails?: None Help needed moving from lying on your back to sitting on the side of a flat bed without using bedrails?: None Help needed moving to and from a bed to a chair (including a wheelchair)?: None Help needed standing up from a chair using your arms (e.g., wheelchair or bedside chair)?: None Help needed to walk in hospital room?: A Little Help needed climbing 3-5 steps with a railing? : A Little 6 Click Score: 22    End of Session Equipment Utilized During Treatment: Gait belt Activity Tolerance: Patient tolerated treatment well Patient left: in bed;with call bell/phone within reach;with family/visitor present Nurse Communication: Mobility status PT Visit Diagnosis: Muscle weakness (generalized) (M62.81);Difficulty in walking, not elsewhere classified (R26.2)     Time: 1315-1340 PT Time Calculation (min) (ACUTE ONLY): 25 min  Charges:  $Gait Training: 8-22 mins $Therapeutic Exercise: 8-22 mins $Therapeutic Activity: 8-22 mins                      [name] 10/11/2020, 1:41 PM

## 2020-10-11 NOTE — Progress Notes (Signed)
Physical Therapy Treatment Patient Details Name: Paula Murphy MRN: [id number] DOB: 1946-04-08 Today's Date: 10/11/2020    History of Present Illness Patient is 74 y.o. female s/p Lt TKA on 10/10/20 with PMH significant for HLD, hypothyroidism, fibromyalgia, macular degeneration, OA, Rt TKA in 2014.    PT Comments    Pt is making good progress with mobility. Pt given HEP handout and demonstrated good understanding of exercises. Pt with full knee flexion and Independent with SLR. Pt ambulated 45 ft with RW min guard assist and reported feeling dizzy and nausea. Pt sat down in a recliner and brought back to her room. Pt requested crackers stating she gets nauseated when taking medications without food. Pt will benefit from BID treatment in anticipation of d/c home later today. Pt will benefit from continued acute skilled PT to maximize mobility and Independence for d/c home with support.   Follow Up Recommendations  Follow surgeon's recommendation for DC plan and follow-up therapies     Equipment Recommendations  Rolling walker with 5" wheels    Recommendations for Other Services       Precautions / Restrictions Precautions Precautions: Fall Restrictions Weight Bearing Restrictions: No Other Position/Activity Restrictions: WBAT    Mobility  Bed Mobility Overal bed mobility: Modified Independent Bed Mobility: Supine to Sit     Supine to sit: HOB elevated        Transfers Overall transfer level: Needs assistance Equipment used: Rolling walker (2 wheeled) Transfers: Sit to/from Stand Sit to Stand: Min guard         General transfer comment: cues for hand placement for improved safety with RW  Ambulation/Gait Ambulation/Gait assistance: Min assist Gait Distance (Feet): 45 Feet Assistive device: Rolling walker (2 wheeled) Gait Pattern/deviations: Step-through pattern;Decreased stride length Gait velocity: decr   General Gait Details: pt reported feeling dizzy,  recliner brough over by nursing tech and pt returned to room. Pt reported she had just taken a bunch of medication and gets nauseated if she doesn't eat right when she takes midication. Pt given graham crackers and felt better.   Stairs             Wheelchair Mobility    Modified Rankin (Stroke Patients Only)       Balance Overall balance assessment: Needs assistance Sitting-balance support: Feet supported Sitting balance-Leahy Scale: Normal     Standing balance support: During functional activity;Bilateral upper extremity supported Standing balance-Leahy Scale: Fair                              Cognition Arousal/Alertness: Awake/alert Behavior During Therapy: WFL for tasks assessed/performed Overall Cognitive Status: Within Functional Limits for tasks assessed                                        Exercises Total Joint Exercises Ankle Circles/Pumps: Both;AROM;20 reps;Supine Quad Sets: AROM;Strengthening;Left;10 reps;Supine Heel Slides: AROM;Strengthening;Left;10 reps;Supine Hip ABduction/ADduction: AROM;Strengthening;Left;10 reps;Supine Straight Leg Raises: AROM;Strengthening;Left;10 reps;Supine Long Arc Quad: AROM;Strengthening;Left;10 reps;Supine Knee Flexion: AROM;Strengthening;Left;10 reps;Seated Goniometric ROM: 90*    General Comments        Pertinent Vitals/Pain Pain Assessment: 0-10 Pain Score: 4  Pain Location: Lt knee Pain Descriptors / Indicators: Aching;Discomfort Pain Intervention(s): Limited activity within patient's tolerance;Repositioned;Monitored during session    Home Living  Prior Function            PT Goals (current goals can now be found in the care plan section) Progress towards PT goals: Progressing toward goals    Frequency    7X/week      PT Plan Current plan remains appropriate    Co-evaluation              AM-PAC PT "6 Clicks" Mobility   Outcome  Measure  Help needed turning from your back to your side while in a flat bed without using bedrails?: None Help needed moving from lying on your back to sitting on the side of a flat bed without using bedrails?: None Help needed moving to and from a bed to a chair (including a wheelchair)?: A Little Help needed standing up from a chair using your arms (e.g., wheelchair or bedside chair)?: A Little Help needed to walk in hospital room?: A Little Help needed climbing 3-5 steps with a railing? : A Little 6 Click Score: 20    End of Session Equipment Utilized During Treatment: Gait belt Activity Tolerance: Patient tolerated treatment well Patient left: in chair;with call bell/phone within reach;with chair alarm set Nurse Communication: Mobility status PT Visit Diagnosis: Muscle weakness (generalized) (M62.81);Difficulty in walking, not elsewhere classified (R26.2)     Time: 2909-0301 PT Time Calculation (min) (ACUTE ONLY): 23 min  Charges:  $Gait Training: 8-22 mins $Therapeutic Exercise: 8-22 mins                      [name] 10/11/2020, 10:48 AM

## 2020-10-11 NOTE — Plan of Care (Signed)
  Problem: Pain Managment: Goal: General experience of comfort will improve Outcome: Progressing   Problem: Safety: Goal: Ability to remain free from injury will improve Outcome: Progressing   Problem: Skin Integrity: Goal: Risk for impaired skin integrity will decrease Outcome: Progressing   Problem: Education: Goal: Knowledge of the prescribed therapeutic regimen will improve Outcome: Progressing   Problem: Activity: Goal: Ability to avoid complications of mobility impairment will improve Outcome: Progressing Goal: Range of joint motion will improve Outcome: Progressing

## 2020-10-17 NOTE — Discharge Summary (Signed)
Physician Discharge Summary   Patient ID: Paula Murphy MRN: [id number] DOB/AGE: 73/09/1946 73 y.o.  Admit date: 10/10/2020 Discharge date: 10/11/2020  Primary Diagnosis: Osteoarthritis, left knee   Admission Diagnoses:  Past Medical History:  Diagnosis Date  . Arthritis    OA AND PAIN RT KNEE  . Autoimmune disease ([place])    + ANA, and DS DNA--PT STATES SHE WAS TOLD SHE DOES NOT HAVE LUPUS AS PREVIOUSLY THOUGHT  . Fibromyalgia    followed by Dr. Estanislado Pandy  . Hemorrhoids   . Hyperlipidemia   . Hypothyroidism   . Macular degeneration    BOTH EYES  . PONV (postoperative nausea and vomiting)   . Thyroid disease    Hypothyroidism   Discharge Diagnoses:   Principal Problem:   OA (osteoarthritis) of knee Active Problems:   Primary osteoarthritis of left knee  Estimated body mass index is 31.18 kg/m as calculated from the following:   Height as of this encounter: 5\' 1"  (1.549 m).   Weight as of this encounter: 74.8 kg.  Procedure:  Procedure(s) (LRB): TOTAL KNEE ARTHROPLASTY (Left)   Consults: None  HPI: Paula Murphy is a 74 y.o. year old female with end stage OA of her left knee with progressively worsening pain and dysfunction. She has constant pain, with activity and at rest and significant functional deficits with difficulties even with ADLs. She has had extensive non-op management including analgesics, injections of cortisone and viscosupplements, and home exercise program, but remains in significant pain with significant dysfunction. Radiographs show bone on bone arthritis medial and patellofemoral. She presents now for left Total Knee Arthroplasty.   Laboratory Data: Admission on 10/10/2020, Discharged on 10/11/2020  Component Date Value Ref Range Status  . WBC 10/11/2020 9.4  4.0 - 10.5 K/uL Final  . RBC 10/11/2020 3.50* 3.87 - 5.11 MIL/uL Final  . Hemoglobin 10/11/2020 10.9* 12.0 - 15.0 g/dL Final  . HCT 10/11/2020 34.2* 36.0 - 46.0 % Final  . MCV 10/11/2020  97.7  80.0 - 100.0 fL Final  . MCH 10/11/2020 31.1  26.0 - 34.0 pg Final  . MCHC 10/11/2020 31.9  30.0 - 36.0 g/dL Final  . RDW 10/11/2020 13.2  11.5 - 15.5 % Final  . Platelets 10/11/2020 202  150 - 400 K/uL Final  . nRBC 10/11/2020 0.0  0.0 - 0.2 % Final   Performed at [facility], [place] [address] [place], [place] [address]  . Sodium 10/11/2020 139  135 - 145 mmol/L Final  . Potassium 10/11/2020 3.6  3.5 - 5.1 mmol/L Final  . Chloride 10/11/2020 110  98 - 111 mmol/L Final  . CO2 10/11/2020 22  22 - 32 mmol/L Final  . Glucose, Bld 10/11/2020 140* 70 - 99 mg/dL Final   Glucose reference range applies only to samples taken after fasting for at least 8 hours.  . BUN 10/11/2020 11  8 - 23 mg/dL Final  . Creatinine, Ser 10/11/2020 0.74  0.44 - 1.00 mg/dL Final  . Calcium 10/11/2020 7.7* 8.9 - 10.3 mg/dL Final  . GFR, Estimated 10/11/2020 >60  >60 mL/min Final   Comment: (NOTE) Calculated using the CKD-EPI Creatinine Equation (2021)   . Anion gap 10/11/2020 7  5 - 15 Final   Performed at [facility], [place] [address], [place], [place] [address]  Hospital Outpatient Visit on 10/06/2020  Component Date Value Ref Range Status  . SARS Coronavirus 2 10/06/2020 NEGATIVE  NEGATIVE Final   Comment: (NOTE) SARS-CoV-2 target  nucleic acids are NOT DETECTED.  The SARS-CoV-2 RNA is generally detectable in upper and lower respiratory specimens during the acute phase of infection. Negative results do not preclude SARS-CoV-2 infection, do not rule out co-infections with other pathogens, and should not be used as the sole basis for treatment or other patient management decisions. Negative results must be combined with clinical observations, patient history, and epidemiological information. The expected result is Negative.  Fact Sheet for Patients: SugarRoll.be  Fact Sheet for Healthcare  Providers: https://www.woods-mathews.com/  This test is not yet approved or cleared by the [place] FDA and  has been authorized for detection and/or diagnosis of SARS-CoV-2 by FDA under an Emergency Use Authorization (EUA). This EUA will remain  in effect (meaning this test can be used) for the duration of the COVID-19 declaration under Se                          ction 564(b)(1) of the Act, 21 U.S.C. section 360bbb-3(b)(1), unless the authorization is terminated or revoked sooner.  Performed at [facility], [place] [address], [place], [place] [address]   Hospital Outpatient Visit on 10/04/2020  Component Date Value Ref Range Status  . aPTT 10/04/2020 27  24 - 36 seconds Final   Performed at [facility], [place] [address] [place], [place] [address]  . WBC 10/04/2020 4.6  4.0 - 10.5 K/uL Final  . RBC 10/04/2020 4.23  3.87 - 5.11 MIL/uL Final  . Hemoglobin 10/04/2020 13.2  12.0 - 15.0 g/dL Final  . HCT 10/04/2020 41.5  36.0 - 46.0 % Final  . MCV 10/04/2020 98.1  80.0 - 100.0 fL Final  . MCH 10/04/2020 31.2  26.0 - 34.0 pg Final  . MCHC 10/04/2020 31.8  30.0 - 36.0 g/dL Final  . RDW 10/04/2020 13.4  11.5 - 15.5 % Final  . Platelets 10/04/2020 220  150 - 400 K/uL Final  . nRBC 10/04/2020 0.0  0.0 - 0.2 % Final   Performed at [facility], [place] [address] [place], [place] [address]  . Sodium 10/04/2020 139  135 - 145 mmol/L Final  . Potassium 10/04/2020 4.1  3.5 - 5.1 mmol/L Final  . Chloride 10/04/2020 109  98 - 111 mmol/L Final  . CO2 10/04/2020 25  22 - 32 mmol/L Final  . Glucose, Bld 10/04/2020 121* 70 - 99 mg/dL Final   Glucose reference range applies only to samples taken after fasting for at least 8 hours.  . BUN 10/04/2020 17  8 - 23 mg/dL Final  . Creatinine, Ser 10/04/2020 0.89  0.44 - 1.00 mg/dL Final  . Calcium 10/04/2020 8.9  8.9 - 10.3 mg/dL Final  . Total Protein 10/04/2020 6.8  6.5 - 8.1 g/dL Final  . Albumin  10/04/2020 3.9  3.5 - 5.0 g/dL Final  . AST 10/04/2020 18  15 - 41 U/L Final  . ALT 10/04/2020 14  0 - 44 U/L Final  . Alkaline Phosphatase 10/04/2020 54  38 - 126 U/L Final  . Total Bilirubin 10/04/2020 0.6  0.3 - 1.2 mg/dL Final  . GFR, Estimated 10/04/2020 >60  >60 mL/min Final   Comment: (NOTE) Calculated using the CKD-EPI Creatinine Equation (2021)   . Anion gap 10/04/2020 5  5 - 15 Final   Performed at [facility], [place] [address], [place], [place] [address]  . Prothrombin Time 10/04/2020 13.3  11.4 - 15.2 seconds Final  .  INR 10/04/2020 1.1  0.8 - 1.2 Final   Comment: (NOTE) INR goal varies based on device and disease states. Performed at [facility], [place] [address], [place], [place] [address]   . ABO/RH(D) 10/04/2020 A POS   Final  . Antibody Screen 10/04/2020 NEG   Final  . Sample Expiration 10/04/2020 10/13/2020,2359   Final  . Extend sample reason 10/04/2020    Final                   Value:NO TRANSFUSIONS OR PREGNANCY IN THE PAST 3 MONTHS Performed at [facility], [place] [address], [place], [place] [address]   . MRSA, PCR 10/04/2020 NEGATIVE  NEGATIVE Final  . Staphylococcus aureus 10/04/2020 NEGATIVE  NEGATIVE Final   Comment: (NOTE) The Xpert SA Assay (FDA approved for NASAL specimens in patients 44 years of age and older), is one component of a comprehensive surveillance program. It is not intended to diagnose infection nor to guide or monitor treatment. Performed at [facility], [place] [address] [place], [place] [address]      X-Rays:No results found.  EKG:No orders found for this or any previous visit.   Hospital Course: QUANA CHAMBERLAIN is a 74 y.o. who was admitted to [facility]. They were brought to the operating room on 10/10/2020 and underwent Procedure(s): TOTAL KNEE ARTHROPLASTY.  Patient tolerated the procedure well and was later transferred to the recovery room and  then to the orthopaedic floor for postoperative care. They were given PO and IV analgesics for pain control following their surgery. They were given 24 hours of postoperative antibiotics of  Anti-infectives (From admission, onward)   Start     Dose/Rate Route Frequency Ordered Stop   10/10/20 1930  ceFAZolin (ANCEF) IVPB 2g/100 mL premix        2 g 200 mL/hr over 30 Minutes Intravenous Every 6 hours 10/10/20 1628 10/11/20 0101   10/10/20 1030  ceFAZolin (ANCEF) IVPB 2g/100 mL premix        2 g 200 mL/hr over 30 Minutes Intravenous On call to O.R. 10/10/20 1016 10/10/20 1312     and started on DVT prophylaxis in the form of Aspirin.   PT and OT were ordered for total joint protocol. Discharge planning consulted to help with postop disposition and equipment needs.  Patient had a good night on the evening of surgery. They started to get up OOB with therapy on POD #0. Pt was seen during rounds and was ready to go home pending progress with therapy. She worked with therapy on POD #1 and was meeting her goals. Pt was discharged to home later that day in stable condition.  Diet: Regular diet Activity: WBAT Follow-up: in 2 weeks Disposition: Home with outpatient physical therapy at [facility] Discharged Condition: stable   Discharge Instructions    Call MD / Call 911   Complete by: As directed    If you experience chest pain or shortness of breath, CALL 911 and be transported to the hospital emergency room.  If you develope a fever above 101 F, pus (white drainage) or increased drainage or redness at the wound, or calf pain, call your surgeon's office.   Change dressing   Complete by: As directed    You may remove the bulky bandage (ACE wrap and gauze) two days after surgery. You will have an adhesive waterproof bandage underneath. Leave this in place until your first follow-up appointment.   Constipation Prevention  Complete by: As directed    Drink plenty of fluids.  Prune juice may be helpful.  You  may use a stool softener, such as Colace (over the counter) 100 mg twice a day.  Use MiraLax (over the counter) for constipation as needed.   Diet - low sodium heart healthy   Complete by: As directed    Do not put a pillow under the knee. Place it under the heel.   Complete by: As directed    Driving restrictions   Complete by: As directed    No driving for two weeks   TED hose   Complete by: As directed    Use stockings (TED hose) for three weeks on both leg(s).  You may remove them at night for sleeping.   Weight bearing as tolerated   Complete by: As directed      Allergies as of 10/11/2020      Reactions   Codeine Nausea And Vomiting   NAUSEA & VOMITING   Hydrocodone Nausea And Vomiting      Medication List    STOP taking these medications   diclofenac sodium 1 % Gel Commonly known as: VOLTAREN   ondansetron 4 MG tablet Commonly known as: ZOFRAN     TAKE these medications   amoxicillin 500 MG capsule Commonly known as: AMOXIL Take 2,000 mg by mouth once.   aspirin 325 MG EC tablet Take 1 tablet (325 mg total) by mouth 2 (two) times daily for 20 days. Then resume one 81 mg aspirin once a day. What changed:   medication strength  how much to take  when to take this  additional instructions   escitalopram 10 MG tablet Commonly known as: LEXAPRO Take 10 mg by mouth daily.   ezetimibe 10 MG tablet Commonly known as: ZETIA Take 10 mg by mouth daily.   fluticasone 50 MCG/ACT nasal spray Commonly known as: FLONASE Place 1 spray into both nostrils daily as needed for allergies.   gabapentin 300 MG capsule Commonly known as: NEURONTIN Take a 300 mg capsule three times a day for two weeks following surgery.Then take a 300 mg capsule two times a day for two weeks. Then take a 300 mg capsule once a day for two weeks. Then discontinue.   HYDROmorphone 2 MG tablet Commonly known as: DILAUDID Take 1-2 tablets (2-4 mg total) by mouth every 6 (six) hours as needed  for severe pain.   LAXATIVE PO Take 2 tablets by mouth daily. Vegetable   levothyroxine 125 MCG tablet Commonly known as: SYNTHROID Take 125 mcg by mouth daily before breakfast.   methocarbamol 500 MG tablet Commonly known as: ROBAXIN Take 1 tablet (500 mg total) by mouth every 6 (six) hours as needed for muscle spasms.   ondansetron 4 MG disintegrating tablet Commonly known as: Zofran ODT Take 1 tablet (4 mg total) by mouth every 8 (eight) hours as needed for nausea or vomiting.   pantoprazole 40 MG tablet Commonly known as: PROTONIX Take 40 mg by mouth daily.   PreserVision AREDS Caps Take 1 capsule by mouth daily.   Prolia 60 MG/ML Sosy injection Generic drug: denosumab Inject 60 mg into the skin every 6 (six) months.   simethicone 125 MG chewable tablet Commonly known as: MYLICON 161 mg daily as needed for flatulence.   topiramate 50 MG tablet Commonly known as: TOPAMAX TAKE 3 TABLETS BY MOUTH AT  BEDTIME   traMADol 50 MG tablet Commonly known as: ULTRAM TAKE 1 TO 2  TABLETS BY  MOUTH TWICE DAILY AS NEEDED What changed: See the new instructions.   traZODone 50 MG tablet Commonly known as: DESYREL Take 50 mg by mouth daily as needed for sleep.   valACYclovir 500 MG tablet Commonly known as: VALTREX Take 500 mg by mouth daily as needed (fever blister).   vitamin B-12 500 MCG tablet Commonly known as: CYANOCOBALAMIN Take 500 mcg by mouth daily.   Vitamin D3 125 MCG (5000 UT) Caps Take 5,000 Units by mouth daily.            Discharge Care Instructions  (From admission, onward)         Start     Ordered   10/11/20 0000  Weight bearing as tolerated        10/11/20 0732   10/11/20 0000  Change dressing       Comments: You may remove the bulky bandage (ACE wrap and gauze) two days after surgery. You will have an adhesive waterproof bandage underneath. Leave this in place until your first follow-up appointment.   10/11/20 0732          Follow-up  Information    Gaynelle Arabian, MD. Schedule an appointment as soon as possible for a visit in 2 week(s).   Specialty: Orthopedic Surgery Contact information: [address] [place] [address] [phone]               Signed: Theresa Duty, PA-C Orthopedic Surgery 10/17/2020, 1:26 PM

## 2020-10-24 ENCOUNTER — Other Ambulatory Visit: Payer: Self-pay | Admitting: Rheumatology

## 2020-10-24 ENCOUNTER — Telehealth: Payer: Self-pay

## 2020-10-24 DIAGNOSIS — G8929 Other chronic pain: Secondary | ICD-10-CM

## 2020-10-24 DIAGNOSIS — Z5181 Encounter for therapeutic drug level monitoring: Secondary | ICD-10-CM

## 2020-10-24 NOTE — Telephone Encounter (Signed)
Patient advised she is due to update UDS and narcotic agreement. Patient states she will come to the office tomorrow to update. See refill request not for further details .

## 2020-10-24 NOTE — Telephone Encounter (Signed)
Last Visit: 06/01/2020 Next Visit: 11/30/2020 UDS: 03/17/2020 Narc Agreement: 03/17/2020  Last Fill: 09/20/2020  Patient recently had surgery and was given pain medication after surgery.   Patient advised she is due to update UDS and narcotic agreement. Patient states she will come to the office tomorrow to update.   Okay to refill Tramadol?

## 2020-10-24 NOTE — Telephone Encounter (Signed)
I called patient.  She states she is on Dilaudid currently because of knee replacement.  She states she stopped trazodone.  I advised her that I would not be able to give her tramadol while she is on Dilaudid.  She stated that she will call us back when she finishes that prescription of Dilaudid.

## 2020-10-24 NOTE — Telephone Encounter (Signed)
Patient called requesting prescription refill of Tramadol to be sent to [facility].

## 2020-11-08 ENCOUNTER — Other Ambulatory Visit: Payer: Self-pay | Admitting: Rheumatology

## 2020-11-08 NOTE — Telephone Encounter (Signed)
Last Visit:06/01/2020 Next Visit: 11/30/2020  Dose: Topamax 50 mg 3 tablets by mouth at bedtime  Okay to RF Topamax?

## 2020-11-16 NOTE — Progress Notes (Signed)
Office Visit Note  Patient: Paula Murphy             Date of Birth: [birth year]           MRN: [id number]             PCP: Arlyss Repress, MD Referring: Arlyss Repress, MD Visit Date: 11/30/2020 Occupation: @GUAROCC @  Subjective:  Left knee pain.   History of Present Illness: Paula Murphy is a 75 y.o. female history of osteoarthritis, osteoporosis and fibromyalgia syndrome.  She states she had left total knee replacement in December by Dr. Maureen Ralphs.  She is a still gradually recovering from that.  She states she has a lot of discomfort in her left knee.  Her right total knee replacement is doing well.  She continues to have some generalized pain from fibromyalgia.  She has been taking tramadol 2 to 4 tablets daily depending on the extent of pain.  She has been taking Prolia every 6 months and her Prolia dose was delayed by a month due to knee replacement surgery.  Activities of Daily Living:  Patient reports morning stiffness for 1 hour.   Patient Reports nocturnal pain.  Difficulty dressing/grooming: Denies Difficulty climbing stairs: Reports Difficulty getting out of chair: Denies Difficulty using hands for taps, buttons, cutlery, and/or writing: Reports  Review of Systems  Constitutional: Positive for fatigue.  HENT: Positive for mouth dryness. Negative for mouth sores and nose dryness.   Eyes: Negative for pain, itching and dryness.  Respiratory: Negative for shortness of breath and difficulty breathing.   Cardiovascular: Negative for chest pain and palpitations.  Gastrointestinal: Negative for blood in stool, constipation and diarrhea.  Endocrine: Negative for increased urination.  Genitourinary: Negative for difficulty urinating.  Musculoskeletal: Positive for arthralgias, joint pain, myalgias, morning stiffness, muscle tenderness and myalgias. Negative for joint swelling.  Skin: Negative for color change, rash and redness.  Allergic/Immunologic: Negative for susceptible  to infections.  Neurological: Positive for weakness. Negative for dizziness, numbness, headaches and memory loss.  Hematological: Negative for bruising/bleeding tendency.  Psychiatric/Behavioral: Negative for confusion.    PMFS History:  Patient Active Problem List   Diagnosis Date Noted  . Primary osteoarthritis of left knee 10/10/2020  . Ingrown toenail 02/02/2019  . Primary osteoarthritis of both feet 03/21/2017  . History of macular degeneration 03/21/2017  . Other fatigue 03/21/2017  . Age-related osteoporosis without current pathological fracture 03/21/2017  . Esophageal reflux 06/29/2013  . Actinic keratosis 04/11/2013  . Systemic lupus erythematosus ([place]) 04/11/2013  . OA (osteoarthritis) of knee 03/02/2013  . DEPRESSION 07/24/2010  . Major depressive disorder, single episode, unspecified 07/24/2010  . UTI 07/04/2010  . ABDOMINAL BLOATING 01/20/2010  . Fibromyalgia 10/04/2009  . INSOMNIA, CHRONIC 10/04/2009  . Autoimmune disease ([place]) 10/04/2009  . Vitamin D deficiency 10/26/2008  . HYPOTHYROIDISM 08/13/2008  . HYPERLIPIDEMIA 08/13/2008  . HYPERTENSION 08/13/2008  . HEADACHE 08/13/2008  . Muscle pain 08/12/2008    Past Medical History:  Diagnosis Date  . Arthritis    OA AND PAIN RT KNEE  . Autoimmune disease ([place])    + ANA, and DS DNA--PT STATES SHE WAS TOLD SHE DOES NOT HAVE LUPUS AS PREVIOUSLY THOUGHT  . Fibromyalgia    followed by Dr. Estanislado Pandy  . Hemorrhoids   . Hyperlipidemia   . Hypothyroidism   . Macular degeneration    BOTH EYES  . PONV (postoperative nausea and vomiting)   . Thyroid disease    Hypothyroidism    Family History  Problem Relation Age of Onset  . Stroke Mother   . Hypertension Mother   . Hyperlipidemia Mother   . Cancer Mother        Lung  . Alzheimer's disease Mother   . Stroke Father   . Hypertension Father   . Diabetes Father        Type II  . Hyperlipidemia Father   . Cancer Father        Lung  . Arthritis Father         Rheumatoid  . Thyroid disease Daughter    Past Surgical History:  Procedure Laterality Date  . ABDOMINAL HYSTERECTOMY  1989  . BUNIONECTOMY Left 2012  . CHOLECYSTECTOMY  1996  . EYE SURGERY     BILATERAL CATARACT EXTRACTION  . KNEE ARTHROSCOPY  1994   Right Knee  . KNEE SURGERY    . NECK SURGERY  2003  . TONSILLECTOMY  1953  . TOTAL KNEE ARTHROPLASTY Right 03/02/2013   Procedure: RIGHT TOTAL KNEE ARTHROPLASTY;  Surgeon: Gearlean Alf, MD;  Location: [facility];  Service: Orthopedics;  Laterality: Right;  . TOTAL KNEE ARTHROPLASTY Left 10/10/2020   Procedure: TOTAL KNEE ARTHROPLASTY;  Surgeon: Gaynelle Arabian, MD;  Location: [facility];  Service: Orthopedics;  Laterality: Left;  65min   Social History   Social History Narrative   Retired   Married   1 daughter/1 grandson   Never Smoked   Alcohol use - yes (social)   Immunization History  Administered Date(s) Administered  . Influenza Whole 08/12/2008, 08/30/2009, 08/23/2010  . PFIZER(Purple Top)SARS-COV-2 Vaccination 11/26/2019, 12/17/2019  . Pneumococcal Polysaccharide-23 03/29/2009  . Td 07/22/2007     Objective: Vital Signs: BP 127/78 (BP Location: Left Arm, Patient Position: Sitting, Cuff Size: Normal)   Pulse 88   Resp 16   Ht 5' 0.5" (1.537 m)   Wt 153 lb 12.8 oz (69.8 kg)   BMI 29.54 kg/m    Physical Exam Vitals and nursing note reviewed.  Constitutional:      Appearance: She is well-developed and well-nourished.  HENT:     Head: Normocephalic and atraumatic.  Eyes:     Extraocular Movements: EOM normal.     Conjunctiva/sclera: Conjunctivae normal.  Cardiovascular:     Rate and Rhythm: Normal rate and regular rhythm.     Pulses: Intact distal pulses.     Heart sounds: Normal heart sounds.  Pulmonary:     Effort: Pulmonary effort is normal.     Breath sounds: Normal breath sounds.  Abdominal:     General: Bowel sounds are normal.     Palpations: Abdomen is soft.  Musculoskeletal:     Cervical back:  Normal range of motion.  Lymphadenopathy:     Cervical: No cervical adenopathy.  Skin:    General: Skin is warm and dry.     Capillary Refill: Capillary refill takes less than 2 seconds.  Neurological:     Mental Status: She is alert and oriented to person, place, and time.  Psychiatric:        Mood and Affect: Mood and affect normal.        Behavior: Behavior normal.      Musculoskeletal Exam: The spine was in good range of motion without discomfort.  She had no tenderness over C-spine, thoracic or lumbar spine.  Shoulder joints, elbow joints, wrist joints, MCPs PIPs and DIPs with good range of motion with no synovitis.  She had good range of motion of her hip joints.  Right total knee replacement is doing well without any warmth.  Left knee joint has been replaced recently with limited extension and warmth.  She had no tenderness over ankles or MTPs.   CDAI Exam: CDAI Score: -- Patient Global: --; Provider Global: -- Swollen: --; Tender: -- Joint Exam 11/30/2020   No joint exam has been documented for this visit   There is currently no information documented on the homunculus. Go to the Rheumatology activity and complete the homunculus joint exam.  Investigation: No additional findings.  Imaging: No results found.  Recent Labs: Lab Results  Component Value Date   WBC 9.4 10/11/2020   HGB 10.9 (L) 10/11/2020   PLT 202 10/11/2020   NA 139 10/11/2020   K 3.6 10/11/2020   CL 110 10/11/2020   CO2 22 10/11/2020   GLUCOSE 140 (H) 10/11/2020   BUN 11 10/11/2020   CREATININE 0.74 10/11/2020   BILITOT 0.6 10/04/2020   ALKPHOS 54 10/04/2020   AST 18 10/04/2020   ALT 14 10/04/2020   PROT 6.8 10/04/2020   ALBUMIN 3.9 10/04/2020   CALCIUM 7.7 (L) 10/11/2020   GFRAA 70 03/30/2020    Speciality Comments: Osteoporosis therapy: Prolia started in April 2019  Procedures:  No procedures performed Allergies: Codeine and Hydrocodone   Assessment / Plan:     Visit Diagnoses:  Age-related osteoporosis without current pathological fracture - DEXA 10/22/19 BMD 0.525 with T-score -2.9. previously on fosamax, started on prolia in April 2019. Last prolia injection: 04/13/2020.  Her Prolia is delayed to because of total knee replacement.  We will discuss next Prolia injection and we will apply for test patient had change in her insurance..  Her last calcium level was low on October 11, 2021.  I could not correct the value is albumin is not available.  We will obtain CMP with GFR today.  Increase intake of calcium rich food was discussed.  She does not like taking calcium supplement.  Risk of hypocalcemia with Prolia was also discussed.  Vitamin D deficiency -she has been taking vitamin D supplement.  Will check vitamin D level today.  Plan: VITAMIN D 25 Hydroxy (Vit-D Deficiency, Fractures)  Medication monitoring encounter -she is on tramadol 50 mg 1 to 2 tablets p.o. twice daily.  She states she takes anywhere from 2 to 4 tablets depending on the pain level.. UDS: 03/17/20 c/w, narcotic agreement: 09/24/2019.  We will renew narcotic agreement.- Plan: DRUG MONITOR, TRAMADOL,QN, URINE, DRUG MONITOR, PANEL 5, W/CONF, URINE, CBC with Differential/Platelet, COMPLETE METABOLIC PANEL WITH GFR  Status post total bilateral knee replacement -  RTR-02/03/2013, and LTR-10/10/20 by Dr. Maureen Ralphs.  She has been experiencing pain in her left knee.  She has limited extension and warmth in her left knee.  She is going to PT.  Primary osteoarthritis of both feet-chronic discomfort.  Trochanteric bursitis of left hip-she has been experiencing increased left trochanteric bursa pain due to recent knee surgery.  Fibromyalgia-she continues to have pain and discomfort from fibromyalgia.  Generalized hyperalgesia and positive tender points were noted.  Other fatigue-related to fibromyalgia and insomnia.  Primary insomnia - Topamax 50 mg 3 tablets by mouth at bedtime.   Positive ANA (antinuclear antibody) -  ANA negative, double-stranded DNA 59, SCL 70 -, Ro negative, La negative, Smith negative, RNP negative, sed rate 6, complements within normal limits on 09/24/19.  She has no clinical features of autoimmune disease.  Other medical problems are listed as follows:  History of depression  History of  hypertension  History of hyperlipidemia  History of hypothyroidism  History of anxiety  History of macular degeneration  Lymphedema  Orders: Orders Placed This Encounter  Procedures  . DRUG MONITOR, TRAMADOL,QN, URINE  . DRUG MONITOR, PANEL 5, W/CONF, URINE  . CBC with Differential/Platelet  . COMPLETE METABOLIC PANEL WITH GFR  . VITAMIN D 25 Hydroxy (Vit-D Deficiency, Fractures)   No orders of the defined types were placed in this encounter.   Follow-Up Instructions: Return in about 6 months (around 05/30/2021) for Osteoarthritis, Osteoporosis.   Bo Merino, MD  Note - This record has been created using Editor, commissioning.  Chart creation errors have been sought, but may not always  have been located. Such creation errors do not reflect on  the standard of medical care.

## 2020-11-30 ENCOUNTER — Telehealth: Payer: Self-pay | Admitting: Pharmacist

## 2020-11-30 ENCOUNTER — Other Ambulatory Visit: Payer: Self-pay

## 2020-11-30 ENCOUNTER — Encounter: Payer: Self-pay | Admitting: Rheumatology

## 2020-11-30 ENCOUNTER — Ambulatory Visit: Payer: Medicare Other | Admitting: Rheumatology

## 2020-11-30 VITALS — BP 127/78 | HR 88 | Resp 16 | Ht 60.5 in | Wt 153.8 lb

## 2020-11-30 DIAGNOSIS — Z96653 Presence of artificial knee joint, bilateral: Secondary | ICD-10-CM | POA: Diagnosis not present

## 2020-11-30 DIAGNOSIS — M1712 Unilateral primary osteoarthritis, left knee: Secondary | ICD-10-CM

## 2020-11-30 DIAGNOSIS — Z8659 Personal history of other mental and behavioral disorders: Secondary | ICD-10-CM

## 2020-11-30 DIAGNOSIS — M7062 Trochanteric bursitis, left hip: Secondary | ICD-10-CM

## 2020-11-30 DIAGNOSIS — Z5181 Encounter for therapeutic drug level monitoring: Secondary | ICD-10-CM | POA: Diagnosis not present

## 2020-11-30 DIAGNOSIS — M19072 Primary osteoarthritis, left ankle and foot: Secondary | ICD-10-CM

## 2020-11-30 DIAGNOSIS — M810 Age-related osteoporosis without current pathological fracture: Secondary | ICD-10-CM | POA: Diagnosis not present

## 2020-11-30 DIAGNOSIS — M797 Fibromyalgia: Secondary | ICD-10-CM

## 2020-11-30 DIAGNOSIS — Z8679 Personal history of other diseases of the circulatory system: Secondary | ICD-10-CM

## 2020-11-30 DIAGNOSIS — M7061 Trochanteric bursitis, right hip: Secondary | ICD-10-CM

## 2020-11-30 DIAGNOSIS — R5383 Other fatigue: Secondary | ICD-10-CM

## 2020-11-30 DIAGNOSIS — E559 Vitamin D deficiency, unspecified: Secondary | ICD-10-CM

## 2020-11-30 DIAGNOSIS — Z8639 Personal history of other endocrine, nutritional and metabolic disease: Secondary | ICD-10-CM

## 2020-11-30 DIAGNOSIS — M19071 Primary osteoarthritis, right ankle and foot: Secondary | ICD-10-CM

## 2020-11-30 DIAGNOSIS — R768 Other specified abnormal immunological findings in serum: Secondary | ICD-10-CM

## 2020-11-30 DIAGNOSIS — Z96651 Presence of right artificial knee joint: Secondary | ICD-10-CM

## 2020-11-30 DIAGNOSIS — Z8669 Personal history of other diseases of the nervous system and sense organs: Secondary | ICD-10-CM

## 2020-11-30 DIAGNOSIS — I890 Lymphedema, not elsewhere classified: Secondary | ICD-10-CM

## 2020-11-30 DIAGNOSIS — F5101 Primary insomnia: Secondary | ICD-10-CM

## 2020-11-30 NOTE — Telephone Encounter (Signed)
Patient establised on Prolia. Patient reports that she's had change in insurance.  Could you please check if: 1) Prolia is covered through pharmacy benefit? 2) AND Prolia through her medical benefit? [id number]   She is getting labs today, 11/30/20, at [facility] with Dr. Estanislado Pandy.  Grant expired on 11/01/20 for clinic administration - no grants currently open for osteoporosis. If it's not covered by pharmacy benefit, she may qualify for [employer] PAP. She has completed PAP in office today.  Knox Saliva, PharmD, MPH Clinical Pharmacist (Rheumatology and Pulmonology)

## 2020-11-30 NOTE — Patient Instructions (Signed)
COVID-19 vaccine recommendations:   COVID-19 vaccine is recommended for everyone (unless you are allergic to a vaccine component), even if you are on a medication that suppresses your immune system. .   Do not take Tylenol or any anti-inflammatory medications (NSAIDs) 24 hours prior to the COVID-19 vaccination.   For immunocompromised person total three doses of COVID-19 vaccination 1 month apart are advised followed by the booster 6 months later.  For immunocompetent person total two doses of COVID-19 vaccination 1 month apart were advised followed by a booster 6 months later  There is no direct evidence about the efficacy of the COVID-19 vaccine in individuals who are on medications that suppress the immune system.   Even if you are fully vaccinated, and you are on any medications that suppress your immune system, please continue to wear a mask, maintain at least six feet social distance and practice hand hygiene.   If you develop a COVID-19 infection, please contact your PCP or our office to determine if you need monoclonal antibody infusion.  The booster vaccine is now available for immunocompromised patients.   Please see the following web sites for updated information.   [email or web address]

## 2020-11-30 NOTE — Telephone Encounter (Signed)
Discussed BIV results with patient. She is amenable to paying $95 for Prolia. Advised that there is also a clinic nurse visit fee but I am unsure how much that is.  She stated that she'd prefer this over going to hospital for Prolia.  We are awaiting lab results from [facility] today with Dr. Shane Crutch prior to scheduling Prolia.  Knox Saliva, PharmD, MPH Clinical Pharmacist (Rheumatology and Pulmonology)

## 2020-11-30 NOTE — Telephone Encounter (Signed)
Per [place], [employer] pays 80% and patient is responsible for 20% No deductible. Will need prior auth.   Pharmacy benefit: [place] is covered with a $95 co-pay.

## 2020-12-01 LAB — COMPLETE METABOLIC PANEL WITH GFR
AG Ratio: 1.6 (calc) (ref 1.0–2.5)
ALT: 22 U/L (ref 6–29)
AST: 21 U/L (ref 10–35)
Albumin: 4.1 g/dL (ref 3.6–5.1)
Alkaline phosphatase (APISO): 62 U/L (ref 37–153)
BUN: 18 mg/dL (ref 7–25)
CO2: 27 mmol/L (ref 20–32)
Calcium: 9.5 mg/dL (ref 8.6–10.4)
Chloride: 108 mmol/L (ref 98–110)
Creat: 0.8 mg/dL (ref 0.60–0.93)
GFR, Est African American: 85 mL/min/{1.73_m2} (ref >=60)
GFR, Est Non African American: 73 mL/min/{1.73_m2} (ref >=60)
Globulin: 2.5 g/dL (calc) (ref 1.9–3.7)
Glucose, Bld: 114 mg/dL — ABNORMAL HIGH (ref 65–99)
Potassium: 4.2 mmol/L (ref 3.5–5.3)
Sodium: 142 mmol/L (ref 135–146)
Total Bilirubin: 0.3 mg/dL (ref 0.2–1.2)
Total Protein: 6.6 g/dL (ref 6.1–8.1)

## 2020-12-01 LAB — CBC WITH DIFFERENTIAL/PLATELET
Absolute Monocytes: 409 cells/uL (ref 200–950)
Basophils Absolute: 40 cells/uL (ref 0–200)
Basophils Relative: 0.6 %
Eosinophils Absolute: 161 cells/uL (ref 15–500)
Eosinophils Relative: 2.4 %
HCT: 42.2 % (ref 35.0–45.0)
Hemoglobin: 13.8 g/dL (ref 11.7–15.5)
Lymphs Abs: 2425 cells/uL (ref 850–3900)
MCH: 32.3 pg (ref 27.0–33.0)
MCHC: 32.7 g/dL (ref 32.0–36.0)
MCV: 98.8 fL (ref 80.0–100.0)
MPV: 11.1 fL (ref 7.5–12.5)
Monocytes Relative: 6.1 %
Neutro Abs: 3665 cells/uL (ref 1500–7800)
Neutrophils Relative %: 54.7 %
Platelets: 294 10*3/uL (ref 140–400)
RBC: 4.27 10*6/uL (ref 3.80–5.10)
RDW: 12.9 % (ref 11.0–15.0)
Total Lymphocyte: 36.2 %
WBC: 6.7 10*3/uL (ref 3.8–10.8)

## 2020-12-01 LAB — VITAMIN D 25 HYDROXY (VIT D DEFICIENCY, FRACTURES): Vit D, 25-Hydroxy: 61 ng/mL (ref 30–100)

## 2020-12-01 NOTE — Progress Notes (Signed)
CBC and CMP are normal.  Glucose is mildly elevated probably not a fasting sample.

## 2020-12-02 ENCOUNTER — Other Ambulatory Visit (HOSPITAL_COMMUNITY): Payer: Self-pay | Admitting: Rheumatology

## 2020-12-02 MED ORDER — PROLIA 60 MG/ML ~~LOC~~ SOSY: 60.0000 mg | PREFILLED_SYRINGE | SUBCUTANEOUS | 1 refills | Status: DC

## 2020-12-02 MED FILL — PROLIA 60 MG/ML SOLN: 60 | 180 days supply | Qty: 1 | Fill #0

## 2020-12-02 NOTE — Telephone Encounter (Signed)
CBC, CMP and Vitamin D WNL to proceed with Prolia.  Patient is agreeable to paying $95 pharmacy copay to have Prolia administered in clinic - she is aware that there may be additional fee for nurse visit. She verbalized understanding.  Will send rx to [facility] to be shipped to clinic. Once shipment arrives, we will schedule her Prolia injection in nurse visit.  Patient does not like to take calcium supplements but does take Vitamin D supplement. Due to recent hypocalcemia labs in December 2021, she should get labs 10-14 days after Prolia since we don't know if her calcium was 2/2 low albumin  Knox Saliva, PharmD, MPH Clinical Pharmacist (Rheumatology and Pulmonology)

## 2020-12-02 NOTE — Telephone Encounter (Signed)
Processed rx for couriering to clinic for next week. Left message for patient to call pharmacy with card info for her $95.00

## 2020-12-05 NOTE — Telephone Encounter (Signed)
Attempted to contact the patient and left message for patient to call the office.  

## 2020-12-05 NOTE — Telephone Encounter (Signed)
Patient returned call to the office and scheduled for 12/06/2020 at 10:30 am.

## 2020-12-05 NOTE — Telephone Encounter (Signed)
Prolia arrived in the office and placed in fridge #2. Patient is in need of a nurse visit. Thanks!

## 2020-12-06 ENCOUNTER — Other Ambulatory Visit: Payer: Self-pay

## 2020-12-06 ENCOUNTER — Ambulatory Visit (INDEPENDENT_AMBULATORY_CARE_PROVIDER_SITE_OTHER): Payer: Medicare HMO | Admitting: *Deleted

## 2020-12-06 VITALS — BP 120/80 | HR 98

## 2020-12-06 DIAGNOSIS — M810 Age-related osteoporosis without current pathological fracture: Secondary | ICD-10-CM | POA: Diagnosis not present

## 2020-12-06 MED ORDER — DENOSUMAB 60 MG/ML ~~LOC~~ SOSY
60.0000 mg | PREFILLED_SYRINGE | Freq: Once | SUBCUTANEOUS | Status: AC
  Administered 2020-12-06: 60 mg via SUBCUTANEOUS

## 2020-12-06 NOTE — Progress Notes (Signed)
Pharmacy Note  Subjective:   Patient presents to clinic today to receive bi-annual dose of Prolia.  Patient running a fever or have signs/symptoms of infection? Yes  Patient currently on antibiotics for the treatment of infection? Yes  Patient had fall in the last 6 months?  No  If yes, did it require medical attention? No   Patient taking calcium 1200 mg daily through diet or supplement and at least 800 units vitamin D? Yes  Objective: CMP     Component Value Date/Time   NA 142 11/30/2020 1132   K 4.2 11/30/2020 1132   CL 108 11/30/2020 1132   CO2 27 11/30/2020 1132   GLUCOSE 114 (H) 11/30/2020 1132   BUN 18 11/30/2020 1132   CREATININE 0.80 11/30/2020 1132   CALCIUM 9.5 11/30/2020 1132   PROT 6.6 11/30/2020 1132   ALBUMIN 3.9 10/04/2020 1136   AST 21 11/30/2020 1132   ALT 22 11/30/2020 1132   ALKPHOS 54 10/04/2020 1136   BILITOT 0.3 11/30/2020 1132   GFRNONAA 73 11/30/2020 1132   GFRAA 85 11/30/2020 1132    CBC    Component Value Date/Time   WBC 6.7 11/30/2020 1132   RBC 4.27 11/30/2020 1132   HGB 13.8 11/30/2020 1132   HCT 42.2 11/30/2020 1132   PLT 294 11/30/2020 1132   MCV 98.8 11/30/2020 1132   MCH 32.3 11/30/2020 1132   MCHC 32.7 11/30/2020 1132   RDW 12.9 11/30/2020 1132   LYMPHSABS 2,425 11/30/2020 1132   MONOABS 0.3 09/27/2009 2128   EOSABS 161 11/30/2020 1132   BASOSABS 40 11/30/2020 1132    Lab Results  Component Value Date   VD25OH 61 11/30/2020    T-score: T-score -2.9  Assessment/Plan:   Administrations This Visit    denosumab (PROLIA) injection 60 mg    Admin Date 12/06/2020 Action Given Dose 60 mg Route Subcutaneous Administered By Carole Binning, LPN          Patient tolerated injection well.    All questions encouraged and answered.  Instructed patient to call with any further questions or concerns.

## 2020-12-14 ENCOUNTER — Telehealth: Payer: Self-pay | Admitting: Pharmacy Technician

## 2020-12-14 NOTE — Telephone Encounter (Signed)
Patient has been renewed for a new OP grant for Prolia through [employer]. Approval dates are 11/14/20 through 11/13/21   Pharmacy Card [id number] [phone] PCN-[id number]  Provided information to pharmacy.

## 2021-02-16 ENCOUNTER — Other Ambulatory Visit: Payer: Self-pay | Admitting: *Deleted

## 2021-02-16 MED ORDER — TRAMADOL HCL 50 MG PO TABS: ORAL_TABLET | ORAL | 0 refills | Status: DC

## 2021-02-16 NOTE — Telephone Encounter (Signed)
Please call patient to schedule appt. Thank you.   Next Visit: Return in about 6 months (around 05/30/2021) for Osteoarthritis, Osteoporosis

## 2021-02-16 NOTE — Telephone Encounter (Signed)
RX faxed from [facility] Visit: 11/30/2020  Next Visit: Return in about 6 months (around 05/30/2021) for Osteoarthritis, Osteoporosis  UDS: 11/30/2020,  Narc Agreement: 11/30/2020  Last Fill: 09/20/2020,   tramadol 50 mg 1 to 2 tablets p.o. twice daily.  Okay to refill Tramadol?

## 2021-03-25 ENCOUNTER — Other Ambulatory Visit: Payer: Self-pay | Admitting: Physician Assistant

## 2021-03-27 NOTE — Telephone Encounter (Signed)
Last Visit: 11/30/2020 Next Visit: 05/30/2021 UDS: 11/30/2020,  Narc Agreement: 11/30/2020  Last Fill: 02/16/2021 , tramadol 50 mg 1 to 2 tablets p.o. twice daily  Okay to refill Tramadol?

## 2021-04-28 ENCOUNTER — Ambulatory Visit: Payer: Medicare HMO | Attending: Orthopedic Surgery | Admitting: Physical Therapy

## 2021-04-28 ENCOUNTER — Other Ambulatory Visit: Payer: Self-pay

## 2021-04-28 DIAGNOSIS — G8929 Other chronic pain: Secondary | ICD-10-CM

## 2021-04-28 DIAGNOSIS — M25662 Stiffness of left knee, not elsewhere classified: Secondary | ICD-10-CM | POA: Diagnosis present

## 2021-04-28 DIAGNOSIS — M6281 Muscle weakness (generalized): Secondary | ICD-10-CM | POA: Insufficient documentation

## 2021-04-28 DIAGNOSIS — R600 Localized edema: Secondary | ICD-10-CM | POA: Diagnosis present

## 2021-04-28 DIAGNOSIS — M25562 Pain in left knee: Secondary | ICD-10-CM | POA: Insufficient documentation

## 2021-04-28 NOTE — Therapy (Signed)
[facility] [address], [place], [place], [address] Phone: [phone]   Fax:  [phone]  Physical Therapy Evaluation  Patient Details  Name: Paula Murphy MRN: [id number] Date of Birth: [birth year] Referring Provider (PT): Dr. Wynelle Link   Encounter Date: 04/28/2021   PT End of Session - 04/28/21 1203     Visit Number 1    Date for PT Re-Evaluation 06/23/21    Authorization Type [employer] will submit for authorization    PT Start Time 1100    PT Stop Time 1145    PT Time Calculation (min) 45 min    Activity Tolerance Patient tolerated treatment well             Past Medical History:  Diagnosis Date   Arthritis    OA AND PAIN RT KNEE   Autoimmune disease ([place])    + ANA, and DS DNA--PT STATES SHE WAS TOLD SHE DOES NOT HAVE LUPUS AS PREVIOUSLY THOUGHT   Fibromyalgia    followed by Dr. Estanislado Pandy   Hemorrhoids    Hyperlipidemia    Hypothyroidism    Macular degeneration    BOTH EYES   PONV (postoperative nausea and vomiting)    Thyroid disease    Hypothyroidism    Past Surgical History:  Procedure Laterality Date   ABDOMINAL HYSTERECTOMY  1989   BUNIONECTOMY Left 2012   [place] CATARACT EXTRACTION   KNEE ARTHROSCOPY  1994   Right Knee   KNEE SURGERY     NECK SURGERY  2003   TONSILLECTOMY  1953   TOTAL KNEE ARTHROPLASTY Right 03/02/2013   Procedure: RIGHT TOTAL KNEE ARTHROPLASTY;  Surgeon: Gearlean Alf, MD;  Location: [facility];  Service: Orthopedics;  Laterality: Right;   TOTAL KNEE ARTHROPLASTY Left 10/10/2020   Procedure: TOTAL KNEE ARTHROPLASTY;  Surgeon: Gaynelle Arabian, MD;  Location: [facility];  Service: Orthopedics;  Laterality: Left;  15min    There were no vitals filed for this visit.    Subjective Assessment - 04/28/21 1104     Subjective TKR 12/6;  Had PT at [facility] and didn't like it; different therapists every time;  very painful.  My pain  was so bad I have bursitis in left hip.  Slept in recliner for 8 weeks;  Finished PT in Feb.  I don't walk right.  Pain wakes at night.  Even bothers right knee.  I think it's the way I walk.    Pertinent History right TKR in [place] 8 years ago good result    Limitations Walking    How long can you walk comfortably? 15 minutes no device needed.    Diagnostic tests x-rays look good    Patient Stated Goals walk like I could before 1 mile;  sleep without pain awakening    Currently in Pain? Yes    Pain Score 0-No pain    Pain Location Knee    Pain Orientation Left;Lateral;Anterior    Pain Type Chronic pain    Pain Radiating Towards 1/10 in left hip    Aggravating Factors  walking, standing, up steps; squatting to pick something up    Pain Relieving Factors recliner; pain pills                [facility] PT Assessment - 04/28/21 0001       Assessment   Medical Diagnosis Left TKR    Referring Provider (PT) Dr. Wynelle Link  Onset Date/Surgical Date 10/10/20    Next MD Visit December    Prior Therapy at [facility]      Precautions   Precautions None      Restrictions   Weight Bearing Restrictions No      Balance Screen   Has the patient fallen in the past 6 months No    Has the patient had a decrease in activity level because of a fear of falling?  No    Is the patient reluctant to leave their home because of a fear of falling?  No      Home Environment   Living Environment Private residence    Type of [place] to enter    Entrance Stairs-Number of Steps 2    Home Layout Two level      Prior Function   Level of Independence Independent with basic ADLs    Leisure craft shows; listen to beach music; go to the beach; church functions      Observation/Other Assessments   Focus on Therapeutic Outcomes (FOTO)  to do next visit      Observation/Other Assessments-Edema    Edema --   mild left anterior knee edema     AROM   Right Knee Extension 8     Right Knee Flexion 140    Left Knee Extension 16    Left Knee Flexion 140      Strength   Overall Strength Comments Able to rise from a standard chair without UE use; left quad lag    Left Hip Flexion 4/5    Left Hip ABduction 4/5      Flexibility   Soft Tissue Assessment /Muscle Length yes    Hamstrings decreased on left; decreased left gastroc      Palpation   Patella mobility decreased patellar mobility inferior    Palpation comment tender left trochanteric bursa      Ambulation/Gait   Assistive device None    Gait Pattern Decreased dorsiflexion - left;Decreased stride length   foot flat rather than heel strike                       Objective measurements completed on examination: See above findings.               PT Education - 04/28/21 1154     Education Details knee extension stretches in supine, seated and standing; backward walk with emphasis on knee extension; emphasis on walk heel then toe    Person(s) Educated Patient    Methods Explanation;Demonstration;Handout    Comprehension Verbalized understanding;Returned demonstration              PT Short Term Goals - 04/28/21 1220       PT SHORT TERM GOAL #1   Title The patient will demonstrate compliance with initial HEP with emphasis on knee extension ROM and quad strengthening    Time 4    Period Weeks    Status New    Target Date 05/26/21      PT SHORT TERM GOAL #2   Title The patient will report a 30% reduction in knee pain with walking, leisure activities, standing and at nighttime for improved sleep    Time 4    Period Weeks    Status New      PT SHORT TERM GOAL #3   Title Left knee extension improved to -12 degrees needed for efficient and less painful  ambulation    Time 4    Period Weeks    Status New               PT Long Term Goals - 04/28/21 1223       PT LONG TERM GOAL #1   Title The patient will be independent with HEP for further improvements in  function, ROM and strength    Time 8    Period Weeks    Status New    Target Date 07/21/21      PT LONG TERM GOAL #2   Title The patient will have improved knee extension to -10 degrees needed for walking longer periods of time in the community    Time 8    Period Weeks    Status New      PT LONG TERM GOAL #3   Title Left knee and hip strength grossly 4+/5 for greater ease ascending and descending steps at her apartment    Time 8    Period Weeks    Status New      PT LONG TERM GOAL #4   Title Overall knee pain improved 60% with ADLS and at night for improved quality of sleep    Time 8    Period Weeks    Status New      PT LONG TERM GOAL #5   Title FOTO goal to be set next visit    Time 8    Period Weeks    Status New                    Plan - 04/28/21 1204     Clinical Impression Statement The patient is s/p left TKR 12/6.  She had outpatient PT until February but discontinued going secondary to having numerous therapists (lack of continuity) and excessive pain.  She reports she has increased lateral and anterior knee pain with walking, standing, ascending/descending stairs and squatting.  Medical history significant for highly reactive fibromyalgia.   She has also developed left lateral hip pain which she attributes to her walking pattern.  She ambulates without an assistive device but with lack of knee extension, decreased heel strike and stride length.  Left knee ROM 16-140 (right knee replaced 8 years ago 8-140).  Left hip and knee strength grossly 4/5.  Quad lag with SLR.  Mild anterior knee edema.  She would benefit from PT to increase knee extension; normalize gait pattern, strengthen quads and hip musculature and for pain modulation.    Personal Factors and Comorbidities Comorbidity 1;Comorbidity 2;Comorbidity 3+    Comorbidities fibromyalgia;  HTN; osteoporosis    Examination-Activity Limitations Locomotion Level;Stand;Squat;Sleep    Examination-Participation  Restrictions Meal Prep;Cleaning;Shop;Community Activity    Stability/Clinical Decision Making Stable/Uncomplicated    Clinical Decision Making Low    Rehab Potential Good    PT Frequency 2x / week    PT Duration 8 weeks    PT Treatment/Interventions ADLs/Self Care Home Management;Aquatic Therapy;Cryotherapy;Electrical Stimulation;Moist Heat;Neuromuscular re-education;Therapeutic exercise;Therapeutic activities;Patient/family education;Manual techniques;Dry needling;Taping;Vasopneumatic Device    PT Next Visit Plan Do FOTO missed on eval; low load long duration stretching for improved knee extension ROM; manual techniques to release HS and gastroc shortened tissue; inferior patellar mobs; gentle joint mobs (with awareness of fibromyalgia and pain sensitivity); review initial HEP for knee extension emphasis for gait;  vasocompression for pain and edema control as needed    PT Home Exercise Plan [facility]    Recommended Other Services May benefit from PT  order for left hip pain if not resolved with knee rehab    Consulted and Agree with Plan of Care Patient             Patient will benefit from skilled therapeutic intervention in order to improve the following deficits and impairments:  Abnormal gait, Decreased range of motion, Difficulty walking, Increased fascial restricitons, Pain, Impaired flexibility, Decreased strength, Increased edema  Visit Diagnosis: Chronic pain of left knee - Plan: PT plan of care cert/re-cert  Stiffness of left knee, not elsewhere classified - Plan: PT plan of care cert/re-cert  Muscle weakness (generalized) - Plan: PT plan of care cert/re-cert  Localized edema - Plan: PT plan of care cert/re-cert     Problem List Patient Active Problem List   Diagnosis Date Noted   Primary osteoarthritis of left knee 10/10/2020   Ingrown toenail 02/02/2019   Primary osteoarthritis of both feet 03/21/2017   History of macular degeneration 03/21/2017   Other fatigue  03/21/2017   Age-related osteoporosis without current pathological fracture 03/21/2017   Esophageal reflux 06/29/2013   Actinic keratosis 04/11/2013   Systemic lupus erythematosus ([place]) 04/11/2013   OA (osteoarthritis) of knee 03/02/2013   DEPRESSION 07/24/2010   Major depressive disorder, single episode, unspecified 07/24/2010   UTI 07/04/2010   ABDOMINAL BLOATING 01/20/2010   Fibromyalgia 10/04/2009   INSOMNIA, CHRONIC 10/04/2009   Autoimmune disease ([place]) 10/04/2009   Vitamin D deficiency 10/26/2008   HYPOTHYROIDISM 08/13/2008   HYPERLIPIDEMIA 08/13/2008   HYPERTENSION 08/13/2008   HEADACHE 08/13/2008   Muscle pain 08/12/2008   Ruben Im, PT 04/28/21 12:30 PM Phone: [phone] Fax: [phone]  Alvera Singh 04/28/2021, 12:29 PM  [facility] [address] [place], [place], [address] Phone: [phone]   Fax:  [phone]  Name: Paula Murphy MRN: [id number] Date of Birth: Nov 29, 1945

## 2021-04-28 NOTE — Patient Instructions (Signed)
Access Code: [email or web address] URL: [email or web address] Date: 04/28/2021 Prepared by: Ruben Im  Exercises Supine Knee Extension Stretch on Towel Roll - 5 x daily - 7 x weekly - 1 sets - 3 reps - 60 hold Seated Passive Knee Extension - 5 x daily - 7 x weekly - 1 sets - 3 reps - 60 hold Standing Hamstring Stretch with Step - 1 x daily - 7 x weekly - 1 sets - 3 reps - 60 hold Heel-Toe Walking - 5 x daily - 7 x weekly - 1 sets - 10 reps Backwards Walking - 1 x daily - 7 x weekly - 1 sets - 10 reps

## 2021-05-01 ENCOUNTER — Other Ambulatory Visit: Payer: Self-pay

## 2021-05-01 ENCOUNTER — Ambulatory Visit: Payer: Medicare HMO | Admitting: Physical Therapy

## 2021-05-01 ENCOUNTER — Encounter: Payer: Self-pay | Admitting: Physical Therapy

## 2021-05-01 DIAGNOSIS — M25562 Pain in left knee: Secondary | ICD-10-CM | POA: Diagnosis not present

## 2021-05-01 DIAGNOSIS — M25662 Stiffness of left knee, not elsewhere classified: Secondary | ICD-10-CM

## 2021-05-01 DIAGNOSIS — G8929 Other chronic pain: Secondary | ICD-10-CM

## 2021-05-01 DIAGNOSIS — R600 Localized edema: Secondary | ICD-10-CM

## 2021-05-01 DIAGNOSIS — M6281 Muscle weakness (generalized): Secondary | ICD-10-CM

## 2021-05-01 NOTE — Therapy (Signed)
[facility] [address], [place], [place], [address] Phone: [phone]   Fax:  [phone]  Physical Therapy Treatment  Patient Details  Name: Paula Murphy MRN: [id number] Date of Birth: 1946-08-19 Referring Provider (PT): Dr. Wynelle Link   Encounter Date: 05/01/2021   PT End of Session - 05/01/21 1144     Visit Number 2    Date for PT Re-Evaluation 06/23/21    Authorization Type [employer] will submit for authorization    PT Start Time 1143    PT Stop Time 1231    PT Time Calculation (min) 48 min    Activity Tolerance Patient tolerated treatment well    Behavior During Therapy WFL for tasks assessed/performed             Past Medical History:  Diagnosis Date   Arthritis    OA AND PAIN RT KNEE   Autoimmune disease ([place])    + ANA, and DS DNA--PT STATES SHE WAS TOLD SHE DOES NOT HAVE LUPUS AS PREVIOUSLY THOUGHT   Fibromyalgia    followed by Dr. Estanislado Pandy   Hemorrhoids    Hyperlipidemia    Hypothyroidism    Macular degeneration    BOTH EYES   PONV (postoperative nausea and vomiting)    Thyroid disease    Hypothyroidism    Past Surgical History:  Procedure Laterality Date   ABDOMINAL HYSTERECTOMY  1989   BUNIONECTOMY Left 2012   [place] CATARACT EXTRACTION   KNEE ARTHROSCOPY  1994   Right Knee   KNEE SURGERY     NECK SURGERY  2003   TONSILLECTOMY  1953   TOTAL KNEE ARTHROPLASTY Right 03/02/2013   Procedure: RIGHT TOTAL KNEE ARTHROPLASTY;  Surgeon: Gearlean Alf, MD;  [facility];  Service: Orthopedics;  Laterality: Right;   TOTAL KNEE ARTHROPLASTY Left 10/10/2020   Procedure: TOTAL KNEE ARTHROPLASTY;  Surgeon: Gaynelle Arabian, MD;  [facility];  Service: Orthopedics;  Laterality: Left;  10min    There were no vitals filed for this visit.   Subjective Assessment - 05/01/21 1145     Subjective Was away for the weekend so i did not do much of my  exercises. Knee is stiff this AM.    Pertinent History right TKR in [place] 8 years ago good result    Currently in Pain? No/denies                [facility] PT Assessment - 05/01/21 0001       Observation/Other Assessments   Focus on Therapeutic Outcomes (FOTO)  25                           [facility] Adult PT Treatment/Exercise - 05/01/21 0001       Neuro Re-ed    Neuro Re-ed Details  The importance of increasing her proprioception in her LTLE, what that means and how to practice: will practice when she goes from sit to stand, pt could correctly demo 10x      Knee/Hip Exercises: Aerobic   Stationary Bike Slow at first to better speed but L0 x 6 min, more for getting the knee loosened up.      Knee/Hip Exercises: Standing   Rebounder weight shifting 1 min 3 ways      Knee/Hip Exercises: Seated   Sit to Sand 10 reps;without UE support   VC to put >  weight into LTLE     Manual Therapy   Manual Therapy Soft tissue mobilization    Soft tissue mobilization Lt gastroc hamstring insertions: concurrent to FOTO questions                      PT Short Term Goals - 05/01/21 1224       PT SHORT TERM GOAL #1   Title The patient will demonstrate compliance with initial HEP with emphasis on knee extension ROM and quad strengthening    Time 4    Period Weeks    Status Achieved    Target Date 05/26/21               PT Long Term Goals - 04/28/21 1223       PT LONG TERM GOAL #1   Title The patient will be independent with HEP for further improvements in function, ROM and strength    Time 8    Period Weeks    Status New    Target Date 07/21/21      PT LONG TERM GOAL #2   Title The patient will have improved knee extension to -10 degrees needed for walking longer periods of time in the community    Time 8    Period Weeks    Status New      PT LONG TERM GOAL #3   Title Left knee and hip strength grossly 4+/5 for greater ease ascending and  descending steps at her apartment    Time 8    Period Weeks    Status New      PT LONG TERM GOAL #4   Title Overall knee pain improved 60% with ADLS and at night for improved quality of sleep    Time 8    Period Weeks    Status New      PT LONG TERM GOAL #5   Title FOTO goal to be set next visit    Time 8    Period Weeks    Status New                   Plan - 05/01/21 [place]             Patient will benefit from skilled therapeutic intervention in order to improve the following deficits and impairments:  Abnormal gait, Decreased range of motion, Difficulty walking, Increased fascial restricitons, Pain, Impaired flexibility, Decreased strength, Increased edema  Visit Diagnosis: Chronic pain of left knee  Stiffness of left knee, not elsewhere classified  Muscle weakness (generalized)  Localized edema     Problem List Patient Active Problem List   Diagnosis Date Noted   Primary osteoarthritis of left knee 10/10/2020   Ingrown toenail 02/02/2019   Primary osteoarthritis of both feet 03/21/2017   History of macular degeneration 03/21/2017   Other fatigue 03/21/2017   Age-related osteoporosis without current pathological fracture 03/21/2017   Esophageal reflux 06/29/2013   Actinic keratosis 04/11/2013   Systemic lupus erythematosus ([place]) 04/11/2013   OA (osteoarthritis) of knee 03/02/2013   DEPRESSION 07/24/2010   Major depressive disorder, single episode, unspecified 07/24/2010   UTI 07/04/2010   ABDOMINAL BLOATING 01/20/2010   Fibromyalgia 10/04/2009   INSOMNIA, CHRONIC 10/04/2009   Autoimmune disease ([place]) 10/04/2009   Vitamin D deficiency 10/26/2008   HYPOTHYROIDISM 08/13/2008   HYPERLIPIDEMIA 08/13/2008   HYPERTENSION 08/13/2008   HEADACHE 08/13/2008   Muscle pain 08/12/2008  Jerin Franzel, PTA 05/01/2021, 12:37 PM  [facility] [address] [place], [place], [address] Phone: [phone]   Fax:  [phone]  Name: Paula Murphy MRN: [id number] Date of Birth: [birth year]

## 2021-05-03 ENCOUNTER — Other Ambulatory Visit: Payer: Self-pay

## 2021-05-03 ENCOUNTER — Encounter: Payer: Self-pay | Admitting: Physical Therapy

## 2021-05-03 ENCOUNTER — Ambulatory Visit: Payer: Medicare HMO | Admitting: Physical Therapy

## 2021-05-03 DIAGNOSIS — M25562 Pain in left knee: Secondary | ICD-10-CM | POA: Diagnosis not present

## 2021-05-03 DIAGNOSIS — M25662 Stiffness of left knee, not elsewhere classified: Secondary | ICD-10-CM

## 2021-05-03 DIAGNOSIS — R600 Localized edema: Secondary | ICD-10-CM

## 2021-05-03 DIAGNOSIS — G8929 Other chronic pain: Secondary | ICD-10-CM

## 2021-05-03 DIAGNOSIS — M6281 Muscle weakness (generalized): Secondary | ICD-10-CM

## 2021-05-03 NOTE — Therapy (Signed)
[facility] [address], [place], [place], [address] Phone: [phone]   Fax:  [phone]  Physical Therapy Treatment  Patient Details  Name: Paula Murphy MRN: [id number] Date of Birth: [birth year] Referring Provider (PT): Dr. Wynelle Link   Encounter Date: 05/03/2021   PT End of Session - 05/03/21 1145     Visit Number 3    Date for PT Re-Evaluation 06/23/21    Authorization Type [employer] will submit for authorization    PT Start Time 6440    PT Stop Time 1228    PT Time Calculation (min) 44 min    Activity Tolerance Patient tolerated treatment well    Behavior During Therapy WFL for tasks assessed/performed             Past Medical History:  Diagnosis Date   Arthritis    OA AND PAIN RT KNEE   Autoimmune disease ([place])    + ANA, and DS DNA--PT STATES SHE WAS TOLD SHE DOES NOT HAVE LUPUS AS PREVIOUSLY THOUGHT   Fibromyalgia    followed by Dr. Estanislado Pandy   Hemorrhoids    Hyperlipidemia    Hypothyroidism    Macular degeneration    BOTH EYES   PONV (postoperative nausea and vomiting)    Thyroid disease    Hypothyroidism    Past Surgical History:  Procedure Laterality Date   ABDOMINAL HYSTERECTOMY  1989   BUNIONECTOMY Left 2012   [place] CATARACT EXTRACTION   KNEE ARTHROSCOPY  1994   Right Knee   KNEE SURGERY     NECK SURGERY  2003   TONSILLECTOMY  1953   TOTAL KNEE ARTHROPLASTY Right 03/02/2013   Procedure: RIGHT TOTAL KNEE ARTHROPLASTY;  Surgeon: Gearlean Alf, MD;  [facility];  Service: Orthopedics;  Laterality: Right;   TOTAL KNEE ARTHROPLASTY Left 10/10/2020   Procedure: TOTAL KNEE ARTHROPLASTY;  Surgeon: Gaynelle Arabian, MD;  [facility];  Service: Orthopedics;  Laterality: Left;  52min    There were no vitals filed for this visit.   Subjective Assessment - 05/03/21 1147     Subjective Hamstring was sore after last session.     Pertinent History right TKR in [place] 8 years ago good result    Currently in Pain? Yes   nothing coming into PT but any bending I can feel across my knee.   Multiple Pain Sites No                [facility] PT Assessment - 05/03/21 0001       AROM   Left Knee Extension 12                           [facility] Adult PT Treatment/Exercise - 05/03/21 0001       Knee/Hip Exercises: Stretches   Gastroc Stretch Left;2 reps;20 seconds    Gastroc Stretch Limitations standing at wall      Knee/Hip Exercises: Aerobic   Stationary Bike L1 x 6 min able to maintain >30 RPMS      Knee/Hip Exercises: Standing   Forward Step Up Left;1 set;10 reps;Hand Hold: 1;Step Height: 6"    Rebounder weight shifting 1 min 3 ways    Other Standing Knee Exercises Rockerboard ankle PF/DF 35min      Knee/Hip Exercises: Seated   Ball Squeeze 3 sec hold 10, then add 2# to Lt LAQ 2x10  TC/VC to avoid ankle inversion   Hamstring Curl --   red band 2x10     Manual Therapy   Manual Therapy Soft tissue mobilization    Soft tissue mobilization Lt gastroc hamstring insertions:                    PT Education - 05/03/21 1225     Education Details HEp    Person(s) Educated Patient    Methods Explanation;Demonstration;Tactile cues;Verbal cues;Handout    Comprehension Returned demonstration;Verbalized understanding              PT Short Term Goals - 05/03/21 1230       PT SHORT TERM GOAL #3   Title Left knee extension improved to -12 degrees needed for efficient and less painful ambulation    Time 4    Period Weeks    Status Achieved               PT Long Term Goals - 04/28/21 1223       PT LONG TERM GOAL #1   Title The patient will be independent with HEP for further improvements in function, ROM and strength    Time 8    Period Weeks    Status New    Target Date 07/21/21      PT LONG TERM GOAL #2   Title The patient will have improved knee extension to -10  degrees needed for walking longer periods of time in the community    Time 8    Period Weeks    Status New      PT LONG TERM GOAL #3   Title Left knee and hip strength grossly 4+/5 for greater ease ascending and descending steps at her apartment    Time 8    Period Weeks    Status New      PT LONG TERM GOAL #4   Title Overall knee pain improved 60% with ADLS and at night for improved quality of sleep    Time 8    Period Weeks    Status New      PT LONG TERM GOAL #5   Title FOTO goal to be set next visit    Time 8    Period Weeks    Status New                   Plan - 05/03/21 1154     Clinical Impression Statement Pt arrives with improving gait: improved consistency with heel toe gait. Pt reports she is more aware of being equal in her weightbearing when rising to standing from sitting. Pt tolerates all exercises well, and achieved 12 degrees of knee extension at end of todays session.    Personal Factors and Comorbidities Comorbidity 1;Comorbidity 2;Comorbidity 3+    Comorbidities fibromyalgia;  HTN; osteoporosis    Examination-Activity Limitations Locomotion Level;Stand;Squat;Sleep    Examination-Participation Restrictions Meal Prep;Cleaning;Shop;Community Activity    Stability/Clinical Decision Making Stable/Uncomplicated    Rehab Potential Good    PT Frequency 2x / week    PT Duration 8 weeks    PT Treatment/Interventions ADLs/Self Care Home Management;Aquatic Therapy;Cryotherapy;Electrical Stimulation;Moist Heat;Neuromuscular re-education;Therapeutic exercise;Therapeutic activities;Patient/family education;Manual techniques;Dry needling;Taping;Vasopneumatic Device    PT Next Visit Plan Knee extension, knee/hip strength, ankle AROM/mobility.    PT Home Exercise Plan [email or web address]    Consulted and Agree with Plan of Care Patient             Patient will benefit from skilled  therapeutic intervention in order to improve the following deficits and impairments:   Abnormal gait, Decreased range of motion, Difficulty walking, Increased fascial restricitons, Pain, Impaired flexibility, Decreased strength, Increased edema  Visit Diagnosis: Chronic pain of left knee  Stiffness of left knee, not elsewhere classified  Muscle weakness (generalized)  Localized edema     Problem List Patient Active Problem List   Diagnosis Date Noted   Primary osteoarthritis of left knee 10/10/2020   Ingrown toenail 02/02/2019   Primary osteoarthritis of both feet 03/21/2017   History of macular degeneration 03/21/2017   Other fatigue 03/21/2017   Age-related osteoporosis without current pathological fracture 03/21/2017   Esophageal reflux 06/29/2013   Actinic keratosis 04/11/2013   Systemic lupus erythematosus ([place]) 04/11/2013   OA (osteoarthritis) of knee 03/02/2013   DEPRESSION 07/24/2010   Major depressive disorder, single episode, unspecified 07/24/2010   UTI 07/04/2010   ABDOMINAL BLOATING 01/20/2010   Fibromyalgia 10/04/2009   INSOMNIA, CHRONIC 10/04/2009   Autoimmune disease ([place]) 10/04/2009   Vitamin D deficiency 10/26/2008   HYPOTHYROIDISM 08/13/2008   HYPERLIPIDEMIA 08/13/2008   HYPERTENSION 08/13/2008   HEADACHE 08/13/2008   Muscle pain 08/12/2008    Caran Storck, PTA 05/03/2021, 12:30 PM  [facility] [address], [place], [place], [address] Phone: [phone]   Fax:  [phone]  Name: Paula Murphy MRN: [id number] Date of Birth: 01/29/1946

## 2021-05-09 ENCOUNTER — Ambulatory Visit: Payer: Medicare HMO | Attending: Orthopedic Surgery | Admitting: Physical Therapy

## 2021-05-09 ENCOUNTER — Other Ambulatory Visit: Payer: Self-pay

## 2021-05-09 DIAGNOSIS — R600 Localized edema: Secondary | ICD-10-CM | POA: Insufficient documentation

## 2021-05-09 DIAGNOSIS — M25562 Pain in left knee: Secondary | ICD-10-CM | POA: Diagnosis present

## 2021-05-09 DIAGNOSIS — G8929 Other chronic pain: Secondary | ICD-10-CM | POA: Insufficient documentation

## 2021-05-09 DIAGNOSIS — M25662 Stiffness of left knee, not elsewhere classified: Secondary | ICD-10-CM | POA: Insufficient documentation

## 2021-05-09 DIAGNOSIS — M6281 Muscle weakness (generalized): Secondary | ICD-10-CM | POA: Insufficient documentation

## 2021-05-09 NOTE — Therapy (Signed)
[facility] [address], [place], [place], [address] Phone: [phone]   Fax:  [phone]  Physical Therapy Treatment  Patient Details  Name: Paula Murphy MRN: [id number] Date of Birth: [birth year] Referring Provider (PT): Dr. Wynelle Link   Encounter Date: 05/09/2021   PT End of Session - 05/09/21 1755     Visit Number 4    Number of Visits 12    Date for PT Re-Evaluation 06/23/21    Authorization Type [employer] 12 visits 6/24-8/19    Authorization - Number of Visits 12    PT Start Time 2671    PT Stop Time 1229    PT Time Calculation (min) 44 min    Activity Tolerance Patient tolerated treatment well             Past Medical History:  Diagnosis Date   Arthritis    OA AND PAIN RT KNEE   Autoimmune disease ([place])    + ANA, and DS DNA--PT STATES SHE WAS TOLD SHE DOES NOT HAVE LUPUS AS PREVIOUSLY THOUGHT   Fibromyalgia    followed by Dr. Estanislado Pandy   Hemorrhoids    Hyperlipidemia    Hypothyroidism    Macular degeneration    BOTH EYES   PONV (postoperative nausea and vomiting)    Thyroid disease    Hypothyroidism    Past Surgical History:  Procedure Laterality Date   ABDOMINAL HYSTERECTOMY  1989   BUNIONECTOMY Left 2012   [place] CATARACT EXTRACTION   KNEE ARTHROSCOPY  1994   Right Knee   KNEE SURGERY     NECK SURGERY  2003   TONSILLECTOMY  1953   TOTAL KNEE ARTHROPLASTY Right 03/02/2013   Procedure: RIGHT TOTAL KNEE ARTHROPLASTY;  Surgeon: Gearlean Alf, MD;  Location: [facility];  Service: Orthopedics;  Laterality: Right;   TOTAL KNEE ARTHROPLASTY Left 10/10/2020   Procedure: TOTAL KNEE ARTHROPLASTY;  Surgeon: Gaynelle Arabian, MD;  Location: [facility];  Service: Orthopedics;  Laterality: Left;  59min    There were no vitals filed for this visit.   Subjective Assessment - 05/09/21 1147     Subjective Sometimes it feels like it's going split open but other  days it's fine.  The bursitis in my hips bothers me a lot.  I think I'm walking better.  My other knee feels better.  This is helping.  I went to watch fireworks in [place] last night.    Pertinent History right TKR in [place] 8 years ago good result    Patient Stated Goals walk like I could before 1 mile;  sleep without pain awakening    Currently in Pain? No/denies    Pain Score 0-No pain    Pain Location Knee    Pain Orientation Left    Pain Type Chronic pain                               [facility] Adult PT Treatment/Exercise - 05/09/21 0001       Knee/Hip Exercises: Stretches   Other Knee/Hip Stretches 2nd step flexion/extension stretching right/left 1 min of each      Knee/Hip Exercises: Standing   Hip Abduction Stengthening;Right;Left;1 set;5 reps    Abduction Limitations ball on wall isometric right/left    Walking with Sports Cord 15# backwards 5x; 10# sidestep right/left 5x each    Other Standing Knee  Exercises TKEs with blue band 15x    Other Standing Knee Exercises Rockerboard ankle PF/DF 67min      Knee/Hip Exercises: Seated   Other Seated Knee/Hip Exercises floor slider with self extension manual pressure      Manual Therapy   Soft tissue mobilization Lt gastroc hamstring insertions:seated                      PT Short Term Goals - 05/03/21 1230       PT SHORT TERM GOAL #3   Title Left knee extension improved to -12 degrees needed for efficient and less painful ambulation    Time 4    Period Weeks    Status Achieved               PT Long Term Goals - 04/28/21 1223       PT LONG TERM GOAL #1   Title The patient will be independent with HEP for further improvements in function, ROM and strength    Time 8    Period Weeks    Status New    Target Date 07/21/21      PT LONG TERM GOAL #2   Title The patient will have improved knee extension to -10 degrees needed for walking longer periods of time in the community     Time 8    Period Weeks    Status New      PT LONG TERM GOAL #3   Title Left knee and hip strength grossly 4+/5 for greater ease ascending and descending steps at her apartment    Time 8    Period Weeks    Status New      PT LONG TERM GOAL #4   Title Overall knee pain improved 60% with ADLS and at night for improved quality of sleep    Time 8    Period Weeks    Status New      PT LONG TERM GOAL #5   Title FOTO goal to be set next visit    Time 8    Period Weeks    Status New                   Plan - 05/09/21 1756     Clinical Impression Statement The patient demonstrates much improved gait pattern compared to initial assessment with improved knee extension and heel strike.  Continued to emphasize quad activation and terminal knee extension throughout treatment session.  Included glute med strengthening needed for longer duration walking progression.  She states she has tolerated the treatment sessions well but usually has next day soreness.  Improving gastroc and HS soft tissue mobility.    Comorbidities fibromyalgia;  HTN; osteoporosis    Examination-Activity Limitations Locomotion Level;Stand;Squat;Sleep    Examination-Participation Restrictions Meal Prep;Cleaning;Shop;Community Activity    Rehab Potential Good    PT Frequency 2x / week    PT Duration 8 weeks    PT Treatment/Interventions ADLs/Self Care Home Management;Aquatic Therapy;Cryotherapy;Electrical Stimulation;Moist Heat;Neuromuscular re-education;Therapeutic exercise;Therapeutic activities;Patient/family education;Manual techniques;Dry needling;Taping;Vasopneumatic Device    PT Next Visit Plan Knee extension, knee/hip strength, ankle AROM/mobility, manual therapy HS/gastroc    PT Home Exercise Plan [facility]             Patient will benefit from skilled therapeutic intervention in order to improve the following deficits and impairments:  Abnormal gait, Decreased range of motion, Difficulty walking,  Increased fascial restricitons, Pain, Impaired flexibility, Decreased strength, Increased edema  Visit  Diagnosis: Chronic pain of left knee  Stiffness of left knee, not elsewhere classified  Muscle weakness (generalized)     Problem List Patient Active Problem List   Diagnosis Date Noted   Primary osteoarthritis of left knee 10/10/2020   Ingrown toenail 02/02/2019   Primary osteoarthritis of both feet 03/21/2017   History of macular degeneration 03/21/2017   Other fatigue 03/21/2017   Age-related osteoporosis without current pathological fracture 03/21/2017   Esophageal reflux 06/29/2013   Actinic keratosis 04/11/2013   Systemic lupus erythematosus ([place]) 04/11/2013   OA (osteoarthritis) of knee 03/02/2013   DEPRESSION 07/24/2010   Major depressive disorder, single episode, unspecified 07/24/2010   UTI 07/04/2010   ABDOMINAL BLOATING 01/20/2010   Fibromyalgia 10/04/2009   INSOMNIA, CHRONIC 10/04/2009   Autoimmune disease ([place]) 10/04/2009   Vitamin D deficiency 10/26/2008   HYPOTHYROIDISM 08/13/2008   HYPERLIPIDEMIA 08/13/2008   HYPERTENSION 08/13/2008   HEADACHE 08/13/2008   Muscle pain 08/12/2008   Ruben Im, PT 05/09/21 6:01 PM Phone: [phone] Fax: [phone]   Alvera Singh 05/09/2021, 6:00 PM  [facility] [address], [place], [place], [address] Phone: [phone]   Fax:  [phone]  Name: CARDELIA SASSANO MRN: [id number] Date of Birth: 25-Jul-1946

## 2021-05-10 ENCOUNTER — Telehealth: Payer: Self-pay | Admitting: *Deleted

## 2021-05-10 NOTE — Telephone Encounter (Signed)
Patient contacted the office stating she thinks she may be due for her Prolia injection. After chart review patient advised she is due for Prolia 06/05/2021. Patient advised she will need labs prior to her injection. Patient has an appointment with Dr. Estanislado Pandy on 05/30/2021. Patient advised she may have labs at her appointment and we can send prescription to the pharmacy. Once received we can schedule an appointment. Patient expressed understanding.

## 2021-05-11 ENCOUNTER — Ambulatory Visit: Payer: Medicare HMO | Admitting: Physical Therapy

## 2021-05-11 ENCOUNTER — Other Ambulatory Visit: Payer: Self-pay

## 2021-05-11 DIAGNOSIS — G8929 Other chronic pain: Secondary | ICD-10-CM

## 2021-05-11 DIAGNOSIS — M25562 Pain in left knee: Secondary | ICD-10-CM | POA: Diagnosis not present

## 2021-05-11 DIAGNOSIS — M6281 Muscle weakness (generalized): Secondary | ICD-10-CM

## 2021-05-11 DIAGNOSIS — M25662 Stiffness of left knee, not elsewhere classified: Secondary | ICD-10-CM

## 2021-05-11 NOTE — Therapy (Signed)
[facility] [address], [place], [place], [address] Phone: [phone]   Fax:  [phone]  Physical Therapy Treatment  Patient Details  Name: Paula Murphy MRN: [id number] Date of Birth: 31-Dec-1945 Referring Provider (PT): Dr. Wynelle Link   Encounter Date: 05/11/2021   PT End of Session - 05/11/21 1710     Visit Number 5    Number of Visits 12    Date for PT Re-Evaluation 06/23/21    Authorization Type [employer] 12 visits 6/24-8/19    Authorization - Number of Visits 12    PT Start Time 1400    PT Stop Time 1443    PT Time Calculation (min) 43 min    Activity Tolerance Patient tolerated treatment well             Past Medical History:  Diagnosis Date   Arthritis    OA AND PAIN RT KNEE   Autoimmune disease ([place])    + ANA, and DS DNA--PT STATES SHE WAS TOLD SHE DOES NOT HAVE LUPUS AS PREVIOUSLY THOUGHT   Fibromyalgia    followed by Dr. Estanislado Pandy   Hemorrhoids    Hyperlipidemia    Hypothyroidism    Macular degeneration    BOTH EYES   PONV (postoperative nausea and vomiting)    Thyroid disease    Hypothyroidism    Past Surgical History:  Procedure Laterality Date   ABDOMINAL HYSTERECTOMY  1989   BUNIONECTOMY Left 2012   [place] CATARACT EXTRACTION   KNEE ARTHROSCOPY  1994   Right Knee   KNEE SURGERY     NECK SURGERY  2003   TONSILLECTOMY  1953   TOTAL KNEE ARTHROPLASTY Right 03/02/2013   Procedure: RIGHT TOTAL KNEE ARTHROPLASTY;  Surgeon: Gearlean Alf, MD;  [facility];  Service: Orthopedics;  Laterality: Right;   TOTAL KNEE ARTHROPLASTY Left 10/10/2020   Procedure: TOTAL KNEE ARTHROPLASTY;  Surgeon: Gaynelle Arabian, MD;  [facility];  Service: Orthopedics;  Laterality: Left;  26min    There were no vitals filed for this visit.   Subjective Assessment - 05/11/21 1403     Subjective I was sore after last time but that's OK.  It's muscles I  haven't used in a while.  Sleeping good this week.    Pertinent History right TKR in [place] 8 years ago good result    Patient Stated Goals walk like I could before 1 mile;  sleep without pain awakening    Currently in Pain? No/denies    Pain Score 0-No pain    Pain Location Knee    Pain Orientation Left                               [facility] Adult PT Treatment/Exercise - 05/11/21 0001       Therapeutic Activites    Therapeutic Activities ADL's    ADL's on toes forward/back to simulate shag dance 40 sec      Knee/Hip Exercises: Aerobic   Nustep L1 6 min      Knee/Hip Exercises: Machines for Strengthening   Total Gym Leg Press seat 7 70# 20; left only 30# 15x (5 sec holds)      Knee/Hip Exercises: Standing   Extension Limitations floor slider 3 ways 5x right/left    Forward Step Up Left;1 set;10 reps;Hand Hold: 1;Step Height: 6"  Forward Step Up Limitations with right foot touch edge of step    SLS with Vectors floor sliders 3 ways 5x right/left    Walking with Sports Cord 20# backwards walk with emphasis on knee extension 10x    Gait Training dynamic walk: march, side step; backwards, kicks, heel raise 1 lap each    Other Standing Knee Exercises TKEs with blue band 15x    Other Standing Knee Exercises Rockerboard ankle PF/DF 67min                      PT Short Term Goals - 05/03/21 1230       PT SHORT TERM GOAL #3   Title Left knee extension improved to -12 degrees needed for efficient and less painful ambulation    Time 4    Period Weeks    Status Achieved               PT Long Term Goals - 04/28/21 1223       PT LONG TERM GOAL #1   Title The patient will be independent with HEP for further improvements in function, ROM and strength    Time 8    Period Weeks    Status New    Target Date 07/21/21      PT LONG TERM GOAL #2   Title The patient will have improved knee extension to -10 degrees needed for walking longer  periods of time in the community    Time 8    Period Weeks    Status New      PT LONG TERM GOAL #3   Title Left knee and hip strength grossly 4+/5 for greater ease ascending and descending steps at her apartment    Time 8    Period Weeks    Status New      PT LONG TERM GOAL #4   Title Overall knee pain improved 60% with ADLS and at night for improved quality of sleep    Time 8    Period Weeks    Status New      PT LONG TERM GOAL #5   Title FOTO goal to be set next visit    Time 8    Period Weeks    Status New                   Plan - 05/11/21 1710     Clinical Impression Statement Improved knee extenson ROM noted during gait cycle.  Improved balance/proprioception with single leg stance noted with repetition of task.  Verbal cues to activate quads and tactile cues to limit compensatory strategies.  On track to meet rehab goals.    Personal Factors and Comorbidities Comorbidity 1;Comorbidity 2;Comorbidity 3+    Comorbidities fibromyalgia;  HTN; osteoporosis    Examination-Participation Restrictions Meal Prep;Cleaning;Shop;Community Activity    Stability/Clinical Decision Making Stable/Uncomplicated    Rehab Potential Good    PT Frequency 2x / week    PT Duration 8 weeks    PT Treatment/Interventions ADLs/Self Care Home Management;Aquatic Therapy;Cryotherapy;Electrical Stimulation;Moist Heat;Neuromuscular re-education;Therapeutic exercise;Therapeutic activities;Patient/family education;Manual techniques;Dry needling;Taping;Vasopneumatic Device    PT Next Visit Plan Check extension ROM measurement next visit; single leg  press;  Knee extension, knee/hip strength, ankle AROM/mobility, manual therapy HS/gastroc    PT Home Exercise Plan [facility]             Patient will benefit from skilled therapeutic intervention in order to improve the following deficits and impairments:  Abnormal gait, Decreased range of motion, Difficulty walking, Increased fascial restricitons,  Pain, Impaired flexibility, Decreased strength, Increased edema  Visit Diagnosis: Chronic pain of left knee  Stiffness of left knee, not elsewhere classified  Muscle weakness (generalized)     Problem List Patient Active Problem List   Diagnosis Date Noted   Primary osteoarthritis of left knee 10/10/2020   Ingrown toenail 02/02/2019   Primary osteoarthritis of both feet 03/21/2017   History of macular degeneration 03/21/2017   Other fatigue 03/21/2017   Age-related osteoporosis without current pathological fracture 03/21/2017   Esophageal reflux 06/29/2013   Actinic keratosis 04/11/2013   Systemic lupus erythematosus ([place]) 04/11/2013   OA (osteoarthritis) of knee 03/02/2013   DEPRESSION 07/24/2010   Major depressive disorder, single episode, unspecified 07/24/2010   UTI 07/04/2010   ABDOMINAL BLOATING 01/20/2010   Fibromyalgia 10/04/2009   INSOMNIA, CHRONIC 10/04/2009   Autoimmune disease ([place]) 10/04/2009   Vitamin D deficiency 10/26/2008   HYPOTHYROIDISM 08/13/2008   HYPERLIPIDEMIA 08/13/2008   HYPERTENSION 08/13/2008   HEADACHE 08/13/2008   Muscle pain 08/12/2008   Ruben Im, PT 05/11/21 5:17 PM Phone: [phone] Fax: [phone]  Alvera Singh 05/11/2021, 5:16 PM  [facility] [address] [place], [place], [address] Phone: [phone]   Fax:  [phone]  Name: Paula Murphy MRN: [id number] Date of Birth: 08-20-1946

## 2021-05-15 ENCOUNTER — Ambulatory Visit: Payer: Medicare HMO | Admitting: Physical Therapy

## 2021-05-15 ENCOUNTER — Other Ambulatory Visit: Payer: Self-pay

## 2021-05-15 ENCOUNTER — Encounter: Payer: Self-pay | Admitting: Physical Therapy

## 2021-05-15 DIAGNOSIS — R600 Localized edema: Secondary | ICD-10-CM

## 2021-05-15 DIAGNOSIS — M6281 Muscle weakness (generalized): Secondary | ICD-10-CM

## 2021-05-15 DIAGNOSIS — M25662 Stiffness of left knee, not elsewhere classified: Secondary | ICD-10-CM

## 2021-05-15 DIAGNOSIS — G8929 Other chronic pain: Secondary | ICD-10-CM

## 2021-05-15 DIAGNOSIS — M25562 Pain in left knee: Secondary | ICD-10-CM | POA: Diagnosis not present

## 2021-05-15 NOTE — Therapy (Signed)
[facility] [address], [place], [place], [address] Phone: [phone]   Fax:  [phone]  Physical Therapy Treatment  Patient Details  Name: Paula Murphy MRN: [id number] Date of Birth: January 17, 1946 Referring Provider (PT): Dr. Wynelle Link   Encounter Date: 05/15/2021   PT End of Session - 05/15/21 1444     Visit Number 6    Number of Visits 12    Date for PT Re-Evaluation 06/23/21    Authorization Type [employer] 12 visits 6/24-8/19    Authorization - Number of Visits 12    PT Start Time 8563    PT Stop Time 1527    PT Time Calculation (min) 43 min    Activity Tolerance Patient tolerated treatment well    Behavior During Therapy WFL for tasks assessed/performed             Past Medical History:  Diagnosis Date   Arthritis    OA AND PAIN RT KNEE   Autoimmune disease ([place])    + ANA, and DS DNA--PT STATES SHE WAS TOLD SHE DOES NOT HAVE LUPUS AS PREVIOUSLY THOUGHT   Fibromyalgia    followed by Dr. Estanislado Pandy   Hemorrhoids    Hyperlipidemia    Hypothyroidism    Macular degeneration    BOTH EYES   PONV (postoperative nausea and vomiting)    Thyroid disease    Hypothyroidism    Past Surgical History:  Procedure Laterality Date   ABDOMINAL HYSTERECTOMY  1989   BUNIONECTOMY Left 2012   [place] CATARACT EXTRACTION   KNEE ARTHROSCOPY  1994   Right Knee   KNEE SURGERY     NECK SURGERY  2003   TONSILLECTOMY  1953   TOTAL KNEE ARTHROPLASTY Right 03/02/2013   Procedure: RIGHT TOTAL KNEE ARTHROPLASTY;  Surgeon: Gearlean Alf, MD;  [facility];  Service: Orthopedics;  Laterality: Right;   TOTAL KNEE ARTHROPLASTY Left 10/10/2020   Procedure: TOTAL KNEE ARTHROPLASTY;  Surgeon: Gaynelle Arabian, MD;  [facility];  Service: Orthopedics;  Laterality: Left;  63min    There were no vitals filed for this visit.   Subjective Assessment - 05/15/21 1451      Subjective My fibromyalgia is acting up today so I am pretty achy all over today.    Pertinent History right TKR in [place] 8 years ago good result    Currently in Pain? --   "I am achey all over."               [facility] PT Assessment - 05/15/21 0001       AROM   Left Knee Extension 10   9 degrees passive, no shoe                          [facility] Adult PT Treatment/Exercise - 05/15/21 0001       Knee/Hip Exercises: Stretches   Other Knee/Hip Stretches Rocker board 3 min, static hold 10 sec 3x      Knee/Hip Exercises: Aerobic   Nustep L2 x 8 min with PTA present to discuss status      Knee/Hip Exercises: Machines for Strengthening   Total Gym Leg Press Seat 7 70# 20x Bil LTLE 30# 2x10   VC to try for full extension     Knee/Hip Exercises: Standing   Extension Limitations floor slider 3 ways 10x right/left  Walking with Sports Cord 20# backwards walk with emphasis on knee extension 10x      Knee/Hip Exercises: Supine   Short Arc Quad Sets Strengthening;Left;1 set;10 reps      Manual Therapy   Soft tissue mobilization Lt gastroc hamstring insertions:seated                      PT Short Term Goals - 05/03/21 1230       PT SHORT TERM GOAL #3   Title Left knee extension improved to -12 degrees needed for efficient and less painful ambulation    Time 4    Period Weeks    Status Achieved               PT Long Term Goals - 04/28/21 1223       PT LONG TERM GOAL #1   Title The patient will be independent with HEP for further improvements in function, ROM and strength    Time 8    Period Weeks    Status New    Target Date 07/21/21      PT LONG TERM GOAL #2   Title The patient will have improved knee extension to -10 degrees needed for walking longer periods of time in the community    Time 8    Period Weeks    Status New      PT LONG TERM GOAL #3   Title Left knee and hip strength grossly 4+/5 for greater ease ascending and  descending steps at her apartment    Time 8    Period Weeks    Status New      PT LONG TERM GOAL #4   Title Overall knee pain improved 60% with ADLS and at night for improved quality of sleep    Time 8    Period Weeks    Status New      PT LONG TERM GOAL #5   Title FOTO goal to be set next visit    Time 8    Period Weeks    Status New                   Plan - 05/15/21 1444     Clinical Impression Statement Pt appears slightly stiffer ( whole body ) due to self reported fibro flare ( weather related) . Gentle movements seemed to help or at least not make her pain/stiffness worse. KNee extension improving slowly, active ( after manual work and gravity hang) 10 degrees, 9 degrees passive, could have gone farther but pt could not tolerate.    Personal Factors and Comorbidities Comorbidity 1;Comorbidity 2;Comorbidity 3+    Comorbidities fibromyalgia;  HTN; osteoporosis    Examination-Activity Limitations Locomotion Level;Stand;Squat;Sleep    Examination-Participation Restrictions Meal Prep;Cleaning;Shop;Community Activity    Stability/Clinical Decision Making Stable/Uncomplicated    Rehab Potential Good    PT Frequency 2x / week    PT Duration 8 weeks    PT Treatment/Interventions ADLs/Self Care Home Management;Aquatic Therapy;Cryotherapy;Electrical Stimulation;Moist Heat;Neuromuscular re-education;Therapeutic exercise;Therapeutic activities;Patient/family education;Manual techniques;Dry needling;Taping;Vasopneumatic Device    PT Next Visit Plan Single leg  press;  Knee extension, knee/hip strength, ankle AROM/mobility, manual therapy HS/gastroc    PT Home Exercise Plan [email or web address]    Consulted and Agree with Plan of Care Patient             Patient will benefit from skilled therapeutic intervention in order to improve the following deficits and impairments:  Abnormal gait, Decreased range  of motion, Difficulty walking, Increased fascial restricitons, Pain, Impaired  flexibility, Decreased strength, Increased edema  Visit Diagnosis: Chronic pain of left knee  Stiffness of left knee, not elsewhere classified  Localized edema  Muscle weakness (generalized)     Problem List Patient Active Problem List   Diagnosis Date Noted   Primary osteoarthritis of left knee 10/10/2020   Ingrown toenail 02/02/2019   Primary osteoarthritis of both feet 03/21/2017   History of macular degeneration 03/21/2017   Other fatigue 03/21/2017   Age-related osteoporosis without current pathological fracture 03/21/2017   Esophageal reflux 06/29/2013   Actinic keratosis 04/11/2013   Systemic lupus erythematosus ([place]) 04/11/2013   OA (osteoarthritis) of knee 03/02/2013   DEPRESSION 07/24/2010   Major depressive disorder, single episode, unspecified 07/24/2010   UTI 07/04/2010   ABDOMINAL BLOATING 01/20/2010   Fibromyalgia 10/04/2009   INSOMNIA, CHRONIC 10/04/2009   Autoimmune disease ([place]) 10/04/2009   Vitamin D deficiency 10/26/2008   HYPOTHYROIDISM 08/13/2008   HYPERLIPIDEMIA 08/13/2008   HYPERTENSION 08/13/2008   HEADACHE 08/13/2008   Muscle pain 08/12/2008    Shyne Lehrke, PTA 05/15/2021, 3:30 PM  [facility] [address] [place], [place], [address] Phone: [phone]   Fax:  [phone]  Name: Paula Murphy MRN: [id number] Date of Birth: May 10, 1946

## 2021-05-16 NOTE — Progress Notes (Signed)
Office Visit Note  Patient: Paula Murphy             Date of Birth: [birth year]           MRN: [id number]             PCP: Arlyss Repress, MD Referring: Arlyss Repress, MD Visit Date: 05/30/2021 Occupation: @GUAROCC @  Subjective:  Increased pain   History of Present Illness: Paula MEHLMAN is a 75 y.o. female with a history of osteoporosis, osteoarthritis, fibromyalgia and positive ANA.  She states about 2 weeks ago she had a severe flare of fibromyalgia with generalized pain and fatigue.  She states the symptoms lasted for about a week and then started improving.  She denies any muscular weakness or muscle pain today.  She continues to have a stiffness almost all day.  She continues to have discomfort in her hands.  She denies any history of oral ulcers, nasal ulcers, malar rash, Raynaud's phenomenon, photosensitivity or lymphadenopathy.  She continued to have some discomfort in her knee joints.  She is going to physical therapy again which has been helpful.  Activities of Daily Living:  Patient reports morning stiffness for all day. Patient Reports nocturnal pain.  Difficulty dressing/grooming: Denies Difficulty climbing stairs: Denies Difficulty getting out of chair: Denies Difficulty using hands for taps, buttons, cutlery, and/or writing: Reports  Review of Systems  Constitutional:  Positive for fatigue.  HENT:  Negative for mouth sores, mouth dryness and nose dryness.   Eyes:  Positive for itching and dryness. Negative for pain.  Respiratory:  Negative for shortness of breath and difficulty breathing.   Cardiovascular:  Negative for chest pain and palpitations.  Gastrointestinal:  Negative for blood in stool, constipation and diarrhea.  Endocrine: Negative for increased urination.  Genitourinary:  Negative for difficulty urinating.  Musculoskeletal:  Positive for joint pain, joint pain, myalgias, morning stiffness, muscle tenderness and myalgias. Negative for joint  swelling.  Skin:  Negative for color change, rash and redness.  Allergic/Immunologic: Negative for susceptible to infections.  Neurological:  Negative for dizziness, numbness, headaches, memory loss and weakness.  Hematological:  Negative for bruising/bleeding tendency.  Psychiatric/Behavioral:  Negative for confusion.    PMFS History:  Patient Active Problem List   Diagnosis Date Noted   Primary osteoarthritis of left knee 10/10/2020   Ingrown toenail 02/02/2019   Primary osteoarthritis of both feet 03/21/2017   History of macular degeneration 03/21/2017   Other fatigue 03/21/2017   Age-related osteoporosis without current pathological fracture 03/21/2017   Esophageal reflux 06/29/2013   Actinic keratosis 04/11/2013   Systemic lupus erythematosus ([place]) 04/11/2013   OA (osteoarthritis) of knee 03/02/2013   DEPRESSION 07/24/2010   Major depressive disorder, single episode, unspecified 07/24/2010   UTI 07/04/2010   ABDOMINAL BLOATING 01/20/2010   Fibromyalgia 10/04/2009   INSOMNIA, CHRONIC 10/04/2009   Autoimmune disease ([place]) 10/04/2009   Vitamin D deficiency 10/26/2008   HYPOTHYROIDISM 08/13/2008   HYPERLIPIDEMIA 08/13/2008   HYPERTENSION 08/13/2008   HEADACHE 08/13/2008   Muscle pain 08/12/2008    Past Medical History:  Diagnosis Date   Arthritis    OA AND PAIN RT KNEE   Autoimmune disease ([place])    + ANA, and DS DNA--PT STATES SHE WAS TOLD SHE DOES NOT HAVE LUPUS AS PREVIOUSLY THOUGHT   Fibromyalgia    followed by Dr. Estanislado Pandy   Hemorrhoids    Hyperlipidemia    Hypothyroidism    Macular degeneration    BOTH EYES   PONV (  postoperative nausea and vomiting)    Thyroid disease    Hypothyroidism    Family History  Problem Relation Age of Onset   Stroke Mother    Hypertension Mother    Hyperlipidemia Mother    Cancer Mother        Lung   Alzheimer's disease Mother    Stroke Father    Hypertension Father    Diabetes Father        Type II   Hyperlipidemia  Father    Cancer Father        Lung   Arthritis Father        Rheumatoid   Thyroid disease Daughter    Past Surgical History:  Procedure Laterality Date   ABDOMINAL HYSTERECTOMY  1989   BUNIONECTOMY Left 2012   CHOLECYSTECTOMY  1996   EYE SURGERY     BILATERAL CATARACT EXTRACTION   KNEE ARTHROSCOPY  1994   Right Knee   KNEE SURGERY     NECK SURGERY  2003   TONSILLECTOMY  1953   TOTAL KNEE ARTHROPLASTY Right 03/02/2013   Procedure: RIGHT TOTAL KNEE ARTHROPLASTY;  Surgeon: Gearlean Alf, MD;  Location: [facility];  Service: Orthopedics;  Laterality: Right;   TOTAL KNEE ARTHROPLASTY Left 10/10/2020   Procedure: TOTAL KNEE ARTHROPLASTY;  Surgeon: Gaynelle Arabian, MD;  Location: [facility];  Service: Orthopedics;  Laterality: Left;  72min   Social History   Social History Narrative   Retired   Married   1 daughter/1 grandson   Never Smoked   Alcohol use - yes (social)   Immunization History  Administered Date(s) Administered   Influenza Whole 08/12/2008, 08/30/2009, 08/23/2010   PFIZER(Purple Top)SARS-COV-2 Vaccination 11/26/2019, 12/17/2019   Pneumococcal Polysaccharide-23 03/29/2009   Td 07/22/2007     Objective: Vital Signs: BP 124/76 (BP Location: Left Arm, Patient Position: Sitting, Cuff Size: Normal)   Pulse 72   Ht 5' 0.75" (1.543 m)   Wt 162 lb (73.5 kg)   BMI 30.86 kg/m    Physical Exam Vitals and nursing note reviewed.  Constitutional:      Appearance: She is well-developed.  HENT:     Head: Normocephalic and atraumatic.  Eyes:     Conjunctiva/sclera: Conjunctivae normal.  Cardiovascular:     Rate and Rhythm: Normal rate and regular rhythm.     Heart sounds: Normal heart sounds.  Pulmonary:     Effort: Pulmonary effort is normal.     Breath sounds: Normal breath sounds.  Abdominal:     General: Bowel sounds are normal.     Palpations: Abdomen is soft.  Musculoskeletal:     Cervical back: Normal range of motion.  Lymphadenopathy:     Cervical: No  cervical adenopathy.  Skin:    General: Skin is warm and dry.     Capillary Refill: Capillary refill takes less than 2 seconds.  Neurological:     Mental Status: She is alert and oriented to person, place, and time.  Psychiatric:        Behavior: Behavior normal.     Musculoskeletal Exam: C-spine was in good range of motion.  Shoulder joints, elbow joints, wrist joints, MCPs PIPs and DIPs with good range of motion with no synovitis.  Hip joints were in good range of motion.  She had limited extension of bilateral knee joints which is replaced.  There was no tenderness over ankles or MTPs.  CDAI Exam: CDAI Score: -- Patient Global: --; Provider Global: -- Swollen: --; Tender: --  Joint Exam 05/30/2021   No joint exam has been documented for this visit   There is currently no information documented on the homunculus. Go to the Rheumatology activity and complete the homunculus joint exam.  Investigation: No additional findings.  Imaging: No results found.  Recent Labs: Lab Results  Component Value Date   WBC 6.7 11/30/2020   HGB 13.8 11/30/2020   PLT 294 11/30/2020   NA 142 11/30/2020   K 4.2 11/30/2020   CL 108 11/30/2020   CO2 27 11/30/2020   GLUCOSE 114 (H) 11/30/2020   BUN 18 11/30/2020   CREATININE 0.80 11/30/2020   BILITOT 0.3 11/30/2020   ALKPHOS 54 10/04/2020   AST 21 11/30/2020   ALT 22 11/30/2020   PROT 6.6 11/30/2020   ALBUMIN 3.9 10/04/2020   CALCIUM 9.5 11/30/2020   GFRAA 85 11/30/2020    Speciality Comments: Prolia: 02/07/18, 08/19/18, 10/12/19, 04/11/20, 12/06/20, 06/04/21  Procedures:  No procedures performed Allergies: Codeine and Hydrocodone   Assessment / Plan:     Visit Diagnoses: Age-related osteoporosis without current pathological fracture - DEXA 10/22/19 BMD 0.525 with T-score -2.9. previously on fosamax, started on prolia in April 2019. Last prolia injection: 12/06/2020 -she has been tolerating Prolia well.  She will be getting her next pulley  injection after the labs.  Plan: CBC with Differential/Platelet, COMPLETE METABOLIC PANEL WITH GFR today.  Vitamin D deficiency -she is on vitamin D 5000 units daily.  Plan: VITAMIN D 25 Hydroxy (Vit-D Deficiency, Fractures)  Medication monitoring encounter -she takes tramadol for pain management.  She states she is unable to function without tramadol.  The tramadol brings her pain level from 8-4 on the scale of 0-10.  Side effects of tramadol were reviewed.  Tramadol 50 mg 1 to 2 tablets p.o. twice daily. - Plan: DRUG MONITOR, TRAMADOL,QN, URINE, DRUG MONITOR, PANEL 5, W/CONF, URINE  Status post total bilateral knee replacement - RTR-02/03/2013, and LTR-10/10/20 by Dr. Maureen Ralphs.  She is going to physical therapy for her left knee replacement.  She has limited extension of bilateral knee joints.  She has difficulty walking due to knee joint discomfort.  Primary osteoarthritis of both feet-doing well  Trochanteric bursitis of left hip-she has noticed improvement in her left trochanteric bursitis after physical therapy.  Fibromyalgia-she had recent flare of fibromyalgia with increased pain and discomfort.  She states the symptoms are gradually improving.  Need for regular exercise was emphasized.  Primary insomnia -she states that Topamax helps with insomnia and generalized pain.  Topamax 50 mg 3 tablets by mouth at bedtime.   Other fatigue-probably related to insomnia.  Positive ANA (antinuclear antibody) -she has had double-stranded DNA positive in the past.  I will check labs again today.  She denies any history of oral ulcers, nasal ulcers, malar rash, photosensitivity, Raynaud's phenomenon or lymphadenopathy.  ANA negative, double-stranded DNA 59, SCL 70 -, Ro negative, La negative, Smith negative, RNP negative, sed rate 6, complements within normal limits on 09/24/19 - Plan: ANA, Anti-DNA antibody, double-stranded, C3 and C4, Sedimentation rate  Other medical problems are listed as  follows:  History of depression  History of hypothyroidism  Lymphedema  History of anxiety  History of hypertension-her blood pressure is well controlled.  History of macular degeneration  History of hyperlipidemia   Orders: Orders Placed This Encounter  Procedures   DRUG MONITOR, TRAMADOL,QN, URINE   DRUG MONITOR, PANEL 5, W/CONF, URINE   CBC with Differential/Platelet   COMPLETE METABOLIC PANEL WITH GFR  VITAMIN D 25 Hydroxy (Vit-D Deficiency, Fractures)   ANA   Anti-DNA antibody, double-stranded   C3 and C4   Sedimentation rate   No orders of the defined types were placed in this encounter.    Follow-Up Instructions: Return in about 6 months (around 11/30/2021) for OA, OP, FMS.   Bo Merino, MD  Note - This record has been created using Editor, commissioning.  Chart creation errors have been sought, but may not always  have been located. Such creation errors do not reflect on  the standard of medical care.

## 2021-05-18 ENCOUNTER — Encounter: Payer: Medicare HMO | Admitting: Physical Therapy

## 2021-05-19 ENCOUNTER — Other Ambulatory Visit (HOSPITAL_COMMUNITY): Payer: Self-pay

## 2021-05-19 ENCOUNTER — Other Ambulatory Visit: Payer: Self-pay | Admitting: *Deleted

## 2021-05-19 MED ORDER — TOPIRAMATE 50 MG PO TABS
150.0000 mg | ORAL_TABLET | Freq: Every day | ORAL | 0 refills | Status: DC
Start: 2021-05-19 — End: 2021-07-31

## 2021-05-19 NOTE — Telephone Encounter (Signed)
Last Visit:06/01/2020  Next Visit: 11/30/2020   Dose: Topamax 50 mg 3 tablets by mouth at bedtime  Last Fill: 11/08/2020   Okay to RF Topamax?

## 2021-05-22 ENCOUNTER — Ambulatory Visit: Payer: Medicare HMO | Admitting: Physical Therapy

## 2021-05-22 ENCOUNTER — Other Ambulatory Visit: Payer: Self-pay

## 2021-05-22 ENCOUNTER — Encounter: Payer: Self-pay | Admitting: Physical Therapy

## 2021-05-22 DIAGNOSIS — G8929 Other chronic pain: Secondary | ICD-10-CM

## 2021-05-22 DIAGNOSIS — M25562 Pain in left knee: Secondary | ICD-10-CM | POA: Diagnosis not present

## 2021-05-22 DIAGNOSIS — R600 Localized edema: Secondary | ICD-10-CM

## 2021-05-22 DIAGNOSIS — M25662 Stiffness of left knee, not elsewhere classified: Secondary | ICD-10-CM

## 2021-05-22 DIAGNOSIS — M6281 Muscle weakness (generalized): Secondary | ICD-10-CM

## 2021-05-22 NOTE — Therapy (Signed)
[facility] [address], [place], [place], [address] Phone: [phone]   Fax:  [phone]  Physical Therapy Treatment  Patient Details  Name: Paula Murphy MRN: [id number] Date of Birth: 20-Nov-1945 Referring Provider (PT): Dr. Wynelle Link   Encounter Date: 05/22/2021   PT End of Session - 05/22/21 1231     Visit Number 7    Number of Visits 12    Date for PT Re-Evaluation 06/23/21    Authorization Type [employer] 12 visits 6/24-8/19    Authorization - Number of Visits 12    PT Start Time 4128    PT Stop Time 1309    PT Time Calculation (min) 38 min    Activity Tolerance Patient tolerated treatment well    Behavior During Therapy WFL for tasks assessed/performed             Past Medical History:  Diagnosis Date   Arthritis    OA AND PAIN RT KNEE   Autoimmune disease ()    + ANA, and DS DNA--PT STATES SHE WAS TOLD SHE DOES NOT HAVE LUPUS AS PREVIOUSLY THOUGHT   Fibromyalgia    followed by Dr. Estanislado Pandy   Hemorrhoids    Hyperlipidemia    Hypothyroidism    Macular degeneration    BOTH EYES   PONV (postoperative nausea and vomiting)    Thyroid disease    Hypothyroidism    Past Surgical History:  Procedure Laterality Date   ABDOMINAL HYSTERECTOMY  1989   BUNIONECTOMY Left 2012   [place] CATARACT EXTRACTION   KNEE ARTHROSCOPY  1994   Right Knee   KNEE SURGERY     NECK SURGERY  2003   TONSILLECTOMY  1953   TOTAL KNEE ARTHROPLASTY Right 03/02/2013   Procedure: RIGHT TOTAL KNEE ARTHROPLASTY;  Surgeon: Gearlean Alf, MD;  Location: [facility];  Service: Orthopedics;  Laterality: Right;   TOTAL KNEE ARTHROPLASTY Left 10/10/2020   Procedure: TOTAL KNEE ARTHROPLASTY;  Surgeon: Gaynelle Arabian, MD;  Location: [facility];  Service: Orthopedics;  Laterality: Left;  74mn    There were no vitals filed for this visit.   Subjective Assessment - 05/22/21 1233      Subjective I had a real bad fibro flare up last Wednesday which is why I had to cancel my last visit. I feel a little weak today but I am walking better. My son in law is in the hospital so I may have to take a phone call during treatment.    Pertinent History right TKR in [place] 8 years ago good result    Currently in Pain? No/denies                               [facility] Adult PT Treatment/Exercise - 05/22/21 0001       Knee/Hip Exercises: Aerobic   Nustep L1 x 6 min with PTA present to discuss status      Knee/Hip Exercises: Machines for Strengthening   Total Gym Leg Press seat 6: Bil LE 70# 2x15: LTLE 30# 2x15      Knee/Hip Exercises: Standing   Extension Limitations floor slider circles LTLE 6x each direction    Forward Step Up Left;1 set;Hand Hold: 1;Step Height: 6";15 reps    Forward Step Up Limitations with right foot touch edge of step    Walking with Sports Cord 20# backwards  and forward walk with emphasis on knee extension 10x    Other Standing Knee Exercises Rockerboard ankle PF/DF 27mn   static hold for calf stretch every 10th rep     Knee/Hip Exercises: Seated   Long Arc Quad Limitations 4# with ball squeeze 10x,2 VC to hold                      PT Short Term Goals - 05/22/21 1258       PT SHORT TERM GOAL #2   Title The patient will report a 30% reduction in knee pain with walking, leisure activities, standing and at nighttime for improved sleep    Time 4    Period Weeks    Status --   80%              PT Long Term Goals - 04/28/21 1223       PT LONG TERM GOAL #1   Title The patient will be independent with HEP for further improvements in function, ROM and strength    Time 8    Period Weeks    Status New    Target Date 07/21/21      PT LONG TERM GOAL #2   Title The patient will have improved knee extension to -10 degrees needed for walking longer periods of time in the community    Time 8    Period Weeks     Status New      PT LONG TERM GOAL #3   Title Left knee and hip strength grossly 4+/5 for greater ease ascending and descending steps at her apartment    Time 8    Period Weeks    Status New      PT LONG TERM GOAL #4   Title Overall knee pain improved 60% with ADLS and at night for improved quality of sleep    Time 8    Period Weeks    Status New      PT LONG TERM GOAL #5   Title FOTO goal to be set next visit    Time 8    Period Weeks    Status New                   Plan - 05/22/21 1259     Clinical Impression Statement Pt arrives pain free today, she is coming off a fibro flare at the end of last week. Overall knee pain 80% reduced since initial eval.  All short term goals are met today.    Personal Factors and Comorbidities Comorbidity 1;Comorbidity 2;Comorbidity 3+    Comorbidities fibromyalgia;  HTN; osteoporosis    Examination-Activity Limitations Locomotion Level;Stand;Squat;Sleep    Examination-Participation Restrictions Meal Prep;Cleaning;Shop;Community Activity    Stability/Clinical Decision Making Stable/Uncomplicated    Rehab Potential Good    PT Frequency 2x / week    PT Duration 8 weeks    PT Next Visit Plan Single leg  press;  Knee extension, knee/hip strength, ankle AROM/mobility, manual therapy HS/gastroc: measure knee extension    PT Home Exercise Plan [id number]    Consulted and Agree with Plan of Care Patient             Patient will benefit from skilled therapeutic intervention in order to improve the following deficits and impairments:  Abnormal gait, Decreased range of motion, Difficulty walking, Increased fascial restricitons, Pain, Impaired flexibility, Decreased strength, Increased edema  Visit Diagnosis: Chronic pain of left knee  Stiffness of  left knee, not elsewhere classified  Localized edema  Muscle weakness (generalized)     Problem List Patient Active Problem List   Diagnosis Date Noted   Primary osteoarthritis of left  knee 10/10/2020   Ingrown toenail 02/02/2019   Primary osteoarthritis of both feet 03/21/2017   History of macular degeneration 03/21/2017   Other fatigue 03/21/2017   Age-related osteoporosis without current pathological fracture 03/21/2017   Esophageal reflux 06/29/2013   Actinic keratosis 04/11/2013   Systemic lupus erythematosus ([place]) 04/11/2013   OA (osteoarthritis) of knee 03/02/2013   DEPRESSION 07/24/2010   Major depressive disorder, single episode, unspecified 07/24/2010   UTI 07/04/2010   ABDOMINAL BLOATING 01/20/2010   Fibromyalgia 10/04/2009   INSOMNIA, CHRONIC 10/04/2009   Autoimmune disease ([place]) 10/04/2009   Vitamin D deficiency 10/26/2008   HYPOTHYROIDISM 08/13/2008   HYPERLIPIDEMIA 08/13/2008   HYPERTENSION 08/13/2008   HEADACHE 08/13/2008   Muscle pain 08/12/2008    Roxas Clymer, PTA 05/22/2021, 1:09 PM  [facility] [address], [place], [place], [address] Phone: [phone]   Fax:  [phone]  Name: BAHJA BENCE MRN: [id number] Date of Birth: 1946/10/22

## 2021-05-25 ENCOUNTER — Ambulatory Visit: Payer: Medicare HMO | Admitting: Physical Therapy

## 2021-05-25 ENCOUNTER — Other Ambulatory Visit: Payer: Self-pay

## 2021-05-25 DIAGNOSIS — R600 Localized edema: Secondary | ICD-10-CM

## 2021-05-25 DIAGNOSIS — M6281 Muscle weakness (generalized): Secondary | ICD-10-CM

## 2021-05-25 DIAGNOSIS — G8929 Other chronic pain: Secondary | ICD-10-CM

## 2021-05-25 DIAGNOSIS — M25662 Stiffness of left knee, not elsewhere classified: Secondary | ICD-10-CM

## 2021-05-25 DIAGNOSIS — M25562 Pain in left knee: Secondary | ICD-10-CM | POA: Diagnosis not present

## 2021-05-25 NOTE — Therapy (Signed)
[facility] [address], [place], [place], [address] Phone: [phone]   Fax:  [phone]  Physical Therapy Treatment  Patient Details  Name: [place] MRN: [id number] Date of Birth: February 17, 1946 Referring Provider (PT): Dr. Wynelle Link   Encounter Date: 05/25/2021   PT End of Session - 05/25/21 1106     Visit Number 8    Number of Visits 12    Date for PT Re-Evaluation 06/23/21    Authorization Type [employer] 12 visits 6/24-8/19    Authorization - Number of Visits 12    PT Start Time 1016    PT Stop Time 1059    PT Time Calculation (min) 43 min    Activity Tolerance Patient tolerated treatment well             Past Medical History:  Diagnosis Date   Arthritis    OA AND PAIN RT KNEE   Autoimmune disease ([place])    + ANA, and DS DNA--PT STATES SHE WAS TOLD SHE DOES NOT HAVE LUPUS AS PREVIOUSLY THOUGHT   Fibromyalgia    followed by Dr. Estanislado Pandy   Hemorrhoids    Hyperlipidemia    Hypothyroidism    Macular degeneration    BOTH EYES   PONV (postoperative nausea and vomiting)    Thyroid disease    Hypothyroidism    Past Surgical History:  Procedure Laterality Date   ABDOMINAL HYSTERECTOMY  1989   BUNIONECTOMY Left 2012   [place] CATARACT EXTRACTION   KNEE ARTHROSCOPY  1994   Right Knee   KNEE SURGERY     NECK SURGERY  2003   TONSILLECTOMY  1953   TOTAL KNEE ARTHROPLASTY Right 03/02/2013   Procedure: RIGHT TOTAL KNEE ARTHROPLASTY;  Surgeon: Gearlean Alf, MD;  Location: [facility];  Service: Orthopedics;  Laterality: Right;   TOTAL KNEE ARTHROPLASTY Left 10/10/2020   Procedure: TOTAL KNEE ARTHROPLASTY;  Surgeon: Gaynelle Arabian, MD;  Location: [facility];  Service: Orthopedics;  Laterality: Left;  27min    There were no vitals filed for this visit.   Subjective Assessment - 05/25/21 1019     Subjective My son-in-law  had a heart attack but is home and doing  fine.  I'm doing better this week.  I did better walking on the gravel path at [employer] to hear beach music last weekend.    Pertinent History right TKR in [place] 8 years ago good result    How long can you walk comfortably? 15 minutes no device needed.    Patient Stated Goals walk like I could before 1 mile;  sleep without pain awakening    Currently in Pain? No/denies    Pain Score 0-No pain    Pain Location Knee    Pain Orientation Left                [facility] PT Assessment - 05/25/21 0001       AROM   Left Knee Extension 9   7 1/2 in sitting                          [facility] Adult PT Treatment/Exercise - 05/25/21 0001       Knee/Hip Exercises: Machines for Strengthening   Cybex Knee Flexion 20# 20x    Total Gym Leg Press seat 6: Bil LE 80# 2x10; LTLE 40# 2x10  Knee/Hip Exercises: Standing   Forward Step Up Left;1 set;Hand Hold: 1;Step Height: 6";15 reps    Forward Step Up Limitations with right foot touch edge of step    Walking with Sports Cord 20# backwards and forward walk with emphasis on knee extension 10x    Other Standing Knee Exercises stepping up and over hurdle with right: forward 5x, lateral 5x weight bearing on left    Other Standing Knee Exercises Rockerboard ankle PF/DF 75min   static hold for calf stretch every 10th rep     Manual Therapy   Soft tissue mobilization left HS, quads, gastroc in supine                      PT Short Term Goals - 05/25/21 1115       PT SHORT TERM GOAL #1   Title The patient will demonstrate compliance with initial HEP with emphasis on knee extension ROM and quad strengthening    Status Achieved      PT SHORT TERM GOAL #2   Title The patient will report a 30% reduction in knee pain with walking, leisure activities, standing and at nighttime for improved sleep    Status Achieved      PT SHORT TERM GOAL #3   Title Left knee extension improved to -12 degrees needed for efficient and  less painful ambulation    Status Achieved               PT Long Term Goals - 05/25/21 1114       PT LONG TERM GOAL #1   Title The patient will be independent with HEP for further improvements in function, ROM and strength    Time 8    Period Weeks    Status On-going      PT LONG TERM GOAL #2   Title The patient will have improved knee extension to -10 degrees needed for walking longer periods of time in the community    Status Achieved      PT LONG TERM GOAL #3   Title Left knee and hip strength grossly 4+/5 for greater ease ascending and descending steps at her apartment    Time 8    Period Weeks    Status On-going      PT LONG TERM GOAL #4   Title Overall knee pain improved 60% with ADLS and at night for improved quality of sleep    Period Weeks    Status On-going                   Plan - 05/25/21 1106     Clinical Impression Statement The patient is coming out of a fibromyalgia flare.  Repetitions kept on the lower side to avoid exacerbation of symptoms.  She reports functional improvements including walking on unlevel surfaces, ascending stairs and standing.  Her knee extension ROM is - 7 1/2 degrees in sitting and -9 degrees in supine (much improved from -16 degrees).  Verbal cues for heel toe gait pattern.  She complains of bil lateral hip pain which may have been caused by her altered gait pattern.  If hip pain does not improve she may benefit from additional PT in the future for further treatment.  She should meet knee goals in 2-3 more visits.    Comorbidities fibromyalgia;  HTN; osteoporosis    Examination-Activity Limitations Locomotion Level;Stand;Squat;Sleep    Rehab Potential Good    PT Frequency 2x / week  PT Duration 8 weeks    PT Treatment/Interventions ADLs/Self Care Home Management;Aquatic Therapy;Cryotherapy;Electrical Stimulation;Moist Heat;Neuromuscular re-education;Therapeutic exercise;Therapeutic activities;Patient/family  education;Manual techniques;Dry needling;Taping;Vasopneumatic Device    PT Next Visit Plan Single leg  press;  Knee extension, knee/hip strength, ankle AROM/mobility, manual therapy HS/gastroc: measure knee extension    PT Home Exercise Plan [facility]             Patient will benefit from skilled therapeutic intervention in order to improve the following deficits and impairments:  Abnormal gait, Decreased range of motion, Difficulty walking, Increased fascial restricitons, Pain, Impaired flexibility, Decreased strength, Increased edema  Visit Diagnosis: Chronic pain of left knee  Stiffness of left knee, not elsewhere classified  Localized edema  Muscle weakness (generalized)     Problem List Patient Active Problem List   Diagnosis Date Noted   Primary osteoarthritis of left knee 10/10/2020   Ingrown toenail 02/02/2019   Primary osteoarthritis of both feet 03/21/2017   History of macular degeneration 03/21/2017   Other fatigue 03/21/2017   Age-related osteoporosis without current pathological fracture 03/21/2017   Esophageal reflux 06/29/2013   Actinic keratosis 04/11/2013   Systemic lupus erythematosus ([place]) 04/11/2013   OA (osteoarthritis) of knee 03/02/2013   DEPRESSION 07/24/2010   Major depressive disorder, single episode, unspecified 07/24/2010   UTI 07/04/2010   ABDOMINAL BLOATING 01/20/2010   Fibromyalgia 10/04/2009   INSOMNIA, CHRONIC 10/04/2009   Autoimmune disease ([place]) 10/04/2009   Vitamin D deficiency 10/26/2008   HYPOTHYROIDISM 08/13/2008   HYPERLIPIDEMIA 08/13/2008   HYPERTENSION 08/13/2008   HEADACHE 08/13/2008   Muscle pain 08/12/2008   Ruben Im, PT 05/25/21 11:16 AM Phone: [phone] Fax: [phone]  Alvera Singh 05/25/2021, 11:16 AM  [facility] [address] [place], [place], [address] Phone: [phone]   Fax:  [phone]  Name: ALEASE FAIT MRN:  [id number] Date of Birth: [birth year]

## 2021-05-30 ENCOUNTER — Ambulatory Visit: Payer: Medicare HMO | Admitting: Rheumatology

## 2021-05-30 ENCOUNTER — Encounter: Payer: Self-pay | Admitting: Rheumatology

## 2021-05-30 ENCOUNTER — Encounter: Payer: Medicare HMO | Admitting: Physical Therapy

## 2021-05-30 ENCOUNTER — Ambulatory Visit: Payer: Medicare HMO | Admitting: Physical Therapy

## 2021-05-30 ENCOUNTER — Other Ambulatory Visit: Payer: Self-pay

## 2021-05-30 VITALS — BP 124/76 | HR 72 | Ht 60.75 in | Wt 162.0 lb

## 2021-05-30 DIAGNOSIS — Z8659 Personal history of other mental and behavioral disorders: Secondary | ICD-10-CM

## 2021-05-30 DIAGNOSIS — Z8669 Personal history of other diseases of the nervous system and sense organs: Secondary | ICD-10-CM

## 2021-05-30 DIAGNOSIS — F5101 Primary insomnia: Secondary | ICD-10-CM

## 2021-05-30 DIAGNOSIS — Z8679 Personal history of other diseases of the circulatory system: Secondary | ICD-10-CM

## 2021-05-30 DIAGNOSIS — R7689 Other specified abnormal immunological findings in serum: Secondary | ICD-10-CM

## 2021-05-30 DIAGNOSIS — Z5181 Encounter for therapeutic drug level monitoring: Secondary | ICD-10-CM | POA: Diagnosis not present

## 2021-05-30 DIAGNOSIS — M19072 Primary osteoarthritis, left ankle and foot: Secondary | ICD-10-CM

## 2021-05-30 DIAGNOSIS — I890 Lymphedema, not elsewhere classified: Secondary | ICD-10-CM

## 2021-05-30 DIAGNOSIS — M25662 Stiffness of left knee, not elsewhere classified: Secondary | ICD-10-CM

## 2021-05-30 DIAGNOSIS — Z8639 Personal history of other endocrine, nutritional and metabolic disease: Secondary | ICD-10-CM

## 2021-05-30 DIAGNOSIS — R600 Localized edema: Secondary | ICD-10-CM

## 2021-05-30 DIAGNOSIS — M810 Age-related osteoporosis without current pathological fracture: Secondary | ICD-10-CM | POA: Diagnosis not present

## 2021-05-30 DIAGNOSIS — M7062 Trochanteric bursitis, left hip: Secondary | ICD-10-CM

## 2021-05-30 DIAGNOSIS — M6281 Muscle weakness (generalized): Secondary | ICD-10-CM

## 2021-05-30 DIAGNOSIS — R768 Other specified abnormal immunological findings in serum: Secondary | ICD-10-CM

## 2021-05-30 DIAGNOSIS — Z96653 Presence of artificial knee joint, bilateral: Secondary | ICD-10-CM

## 2021-05-30 DIAGNOSIS — M19042 Primary osteoarthritis, left hand: Secondary | ICD-10-CM

## 2021-05-30 DIAGNOSIS — M797 Fibromyalgia: Secondary | ICD-10-CM

## 2021-05-30 DIAGNOSIS — M19071 Primary osteoarthritis, right ankle and foot: Secondary | ICD-10-CM

## 2021-05-30 DIAGNOSIS — M19041 Primary osteoarthritis, right hand: Secondary | ICD-10-CM

## 2021-05-30 DIAGNOSIS — G8929 Other chronic pain: Secondary | ICD-10-CM

## 2021-05-30 DIAGNOSIS — M25562 Pain in left knee: Secondary | ICD-10-CM

## 2021-05-30 DIAGNOSIS — E559 Vitamin D deficiency, unspecified: Secondary | ICD-10-CM

## 2021-05-30 DIAGNOSIS — R5383 Other fatigue: Secondary | ICD-10-CM

## 2021-05-30 NOTE — Therapy (Signed)
[facility] [address], [place], [place], [address] Phone: [phone]   Fax:  [phone]  Physical Therapy Treatment  Patient Details  Name: CHERRITA TRAUGHBER MRN: [id number] Date of Birth: 1946/01/22 Referring Provider (PT): Dr. Wynelle Link   Encounter Date: 05/30/2021   PT End of Session - 05/30/21 1450     Visit Number 9    Number of Visits 12    Date for PT Re-Evaluation 06/23/21    Authorization Type [employer] 12 visits 6/24-8/19    Authorization - Number of Visits 12    PT Start Time 1414   pt late   PT Stop Time 1445    PT Time Calculation (min) 31 min    Activity Tolerance Patient tolerated treatment well             Past Medical History:  Diagnosis Date   Arthritis    OA AND PAIN RT KNEE   Autoimmune disease ([place])    + ANA, and DS DNA--PT STATES SHE WAS TOLD SHE DOES NOT HAVE LUPUS AS PREVIOUSLY THOUGHT   Fibromyalgia    followed by Dr. Estanislado Pandy   Hemorrhoids    Hyperlipidemia    Hypothyroidism    Macular degeneration    BOTH EYES   PONV (postoperative nausea and vomiting)    Thyroid disease    Hypothyroidism    Past Surgical History:  Procedure Laterality Date   ABDOMINAL HYSTERECTOMY  1989   BUNIONECTOMY Left 2012   [place] CATARACT EXTRACTION   KNEE ARTHROSCOPY  1994   Right Knee   KNEE SURGERY     NECK SURGERY  2003   TONSILLECTOMY  1953   TOTAL KNEE ARTHROPLASTY Right 03/02/2013   Procedure: RIGHT TOTAL KNEE ARTHROPLASTY;  Surgeon: Gearlean Alf, MD;  [facility]: [facility];  Service: Orthopedics;  Laterality: Right;   TOTAL KNEE ARTHROPLASTY Left 10/10/2020   Procedure: TOTAL KNEE ARTHROPLASTY;  Surgeon: Gaynelle Arabian, MD;  [facility];  Service: Orthopedics;  Laterality: Left;  72mn    There were no vitals filed for this visit.   Subjective Assessment - 05/30/21 1416     Subjective I did a lot of work yesterday so my leg was stiff  and tired.  My hips are 98% better.  No increase in swelling.    [place]    Patient Stated Goals walk like I could before 1 mile;  sleep without pain awakening    Currently in Pain? No/denies    Pain Score 0-No pain    Pain Location Knee    Pain Orientation Left                               [facility] Adult PT Treatment/Exercise - 05/30/21 0001       Knee/Hip Exercises: Stretches   Other Knee/Hip Stretches 2nd step flexion/extension stretching right/left 1 min of each      Knee/Hip Exercises: Aerobic   Nustep L1 x 5 min with PTA present to discuss status      Knee/Hip Exercises: Machines for Strengthening   Cybex Knee Flexion 25# 60x    Total Gym Leg Press seat 6: left LE 40# 2x10      Knee/Hip Exercises: Standing   Terminal Knee Extension Strengthening;Left;15 reps    Theraband Level (Terminal Knee Extension)  Level 3 (Green)    Step Down Right;1 set;5 reps;Step Height: 4"    Walking with Sports Cord 25# backwards and forward walk with emphasis on knee extension 10x    Other Standing Knee Exercises stepping up and over hurdle with right: forward 5x, lateral 10x weight bearing on left    Other Standing Knee Exercises Rockerboard ankle PF/DF 1mn                       PT Short Term Goals - 05/25/21 1115       PT SHORT TERM GOAL #1   Title The patient will demonstrate compliance with initial HEP with emphasis on knee extension ROM and quad strengthening    Status Achieved      PT SHORT TERM GOAL #2   Title The patient will report a 30% reduction in knee pain with walking, leisure activities, standing and at nighttime for improved sleep    Status Achieved      PT SHORT TERM GOAL #3   Title Left knee extension improved to -12 degrees needed for efficient and less painful ambulation    Status Achieved               PT Long Term Goals - 05/25/21 1114       PT LONG TERM GOAL #1    Title The patient will be independent with HEP for further improvements in function, ROM and strength    Time 8    Period Weeks    Status On-going      PT LONG TERM GOAL #2   Title The patient will have improved knee extension to -10 degrees needed for walking longer periods of time in the community    Status Achieved      PT LONG TERM GOAL #3   Title Left knee and hip strength grossly 4+/5 for greater ease ascending and descending steps at her apartment    Time 8    Period Weeks    Status On-going      PT LONG TERM GOAL #4   Title Overall knee pain improved 60% with ADLS and at night for improved quality of sleep    Period Weeks    Status On-going                   Plan - 05/30/21 1452     Clinical Impression Statement The patient has much improved gait pattern with improved knee extension.  She reports her hips are much less painful which may be attributed to improved gait pattern.  Quad strength and motor control are improving as well but verbal and tactile cues to avoid compensatory/subsititution patterns.  Verbal cues to encourage terminal knee extension.  She reports she feels better at the end of treatment session compared to arrival.  She should meet remaining goals in 1-3 visits.    Personal Factors and Comorbidities Comorbidity 1;Comorbidity 2;Comorbidity 3+    Comorbidities fibromyalgia;  HTN; osteoporosis    Examination-Activity Limitations Locomotion Level;Stand;Squat;Sleep    Stability/Clinical Decision Making Stable/Uncomplicated    Rehab Potential Good    PT Frequency 2x / week    PT Duration 8 weeks    PT Treatment/Interventions ADLs/Self Care Home Management;Aquatic Therapy;Cryotherapy;Electrical Stimulation;Moist Heat;Neuromuscular re-education;Therapeutic exercise;Therapeutic activities;Patient/family education;Manual techniques;Dry needling;Taping;Vasopneumatic Device    PT Next Visit Plan 10th visit prog note;  pt goal to walk faster;  recheck knee ROM  and progress toward goals; Single leg  press;  Knee  extension, knee/hip strength, ankle AROM/mobility, manual therapy HS/gastroc: measure knee extension    PT Home Exercise Plan [facility]             Patient will benefit from skilled therapeutic intervention in order to improve the following deficits and impairments:  Abnormal gait, Decreased range of motion, Difficulty walking, Increased fascial restricitons, Pain, Impaired flexibility, Decreased strength, Increased edema  Visit Diagnosis: Chronic pain of left knee  Stiffness of left knee, not elsewhere classified  Localized edema  Muscle weakness (generalized)     Problem List Patient Active Problem List   Diagnosis Date Noted   Primary osteoarthritis of left knee 10/10/2020   Ingrown toenail 02/02/2019   Primary osteoarthritis of both feet 03/21/2017   History of macular degeneration 03/21/2017   Other fatigue 03/21/2017   Age-related osteoporosis without current pathological fracture 03/21/2017   Esophageal reflux 06/29/2013   Actinic keratosis 04/11/2013   Systemic lupus erythematosus ([place]) 04/11/2013   OA (osteoarthritis) of knee 03/02/2013   DEPRESSION 07/24/2010   Major depressive disorder, single episode, unspecified 07/24/2010   UTI 07/04/2010   ABDOMINAL BLOATING 01/20/2010   Fibromyalgia 10/04/2009   INSOMNIA, CHRONIC 10/04/2009   Autoimmune disease ([place]) 10/04/2009   Vitamin D deficiency 10/26/2008   HYPOTHYROIDISM 08/13/2008   HYPERLIPIDEMIA 08/13/2008   HYPERTENSION 08/13/2008   HEADACHE 08/13/2008   Muscle pain 08/12/2008   Ruben Im, PT 05/30/21 3:00 PM Phone: [phone] Fax: [phone]   Alvera Singh 05/30/2021, 2:59 PM  [facility] [address] [place], [place], [address] Phone: [phone]   Fax:  [phone]  Name: KOPELYN DILLINGER MRN: [id number] Date of Birth: 12/03/1945

## 2021-06-01 ENCOUNTER — Ambulatory Visit: Payer: Medicare HMO | Admitting: Physical Therapy

## 2021-06-01 ENCOUNTER — Other Ambulatory Visit (HOSPITAL_COMMUNITY): Payer: Self-pay

## 2021-06-01 MED FILL — Denosumab Inj Soln Prefilled Syringe 60 MG/ML: SUBCUTANEOUS | 180 days supply | Qty: 1 | Fill #0 | Status: AC

## 2021-06-01 NOTE — Telephone Encounter (Addendum)
CBC, CMP and Vitamin D completed on 05/30/21 were wnl to proceed with Prolia. Still awaiting remaining labs that were drawn to result.   She is due for Prolia on or after 06/04/21.  Rx that was sent on 12/02/20 was sent with one refill so new prescription is not needed.  Email sent to [facility] to courier rx to clinic.  Will route to Gwenlyn Perking as Juluis Rainier to have patient scheduled for appointment. Routing to pharmacy pool for follow up  Knox Saliva, PharmD, MPH, BCPS Clinical Pharmacist (Rheumatology and Pulmonology)

## 2021-06-01 NOTE — Telephone Encounter (Addendum)
Patient's CBC, CMP and Vitamin D that were drawn on 05/30/21 have resulted and are wnl to proceed with Prolia. Still awaiting remaining labs to result. Patient aware that remaining labs do not affect her receiving Prolia. Advised we will call when all of results are received.  She continues to take Vitamin D supplementation OTC. She does not take calcium because it causes GI upset, but calcium wnl to proceed  Patient scheduled to receive Prolia on 06/06/21 @ 2pm. She will call around 1pm to let us know when she is on her way so we can pull Prolia out of fridge ahead of time.  Per [facility], expected shipment of Prolia syringe to clinic is 06/02/21 with no copay.   Encouraged and answered all questions  Knox Saliva, PharmD, MPH, BCPS Clinical Pharmacist (Rheumatology and Pulmonology)

## 2021-06-02 LAB — DM TEMPLATE

## 2021-06-02 LAB — CBC WITH DIFFERENTIAL/PLATELET
Absolute Monocytes: 397 cells/uL (ref 200–950)
Basophils Absolute: 31 cells/uL (ref 0–200)
Basophils Relative: 0.5 %
Eosinophils Absolute: 285 cells/uL (ref 15–500)
Eosinophils Relative: 4.6 %
HCT: 38.7 % (ref 35.0–45.0)
Hemoglobin: 12.5 g/dL (ref 11.7–15.5)
Lymphs Abs: 2685 cells/uL (ref 850–3900)
MCH: 30.9 pg (ref 27.0–33.0)
MCHC: 32.3 g/dL (ref 32.0–36.0)
MCV: 95.6 fL (ref 80.0–100.0)
MPV: 10.8 fL (ref 7.5–12.5)
Monocytes Relative: 6.4 %
Neutro Abs: 2802 cells/uL (ref 1500–7800)
Neutrophils Relative %: 45.2 %
Platelets: 213 10*3/uL (ref 140–400)
RBC: 4.05 10*6/uL (ref 3.80–5.10)
RDW: 12.4 % (ref 11.0–15.0)
Total Lymphocyte: 43.3 %
WBC: 6.2 10*3/uL (ref 3.8–10.8)

## 2021-06-02 LAB — DRUG MONITOR, PANEL 5, W/CONF, URINE
Amphetamines: NEGATIVE ng/mL (ref <500)
Barbiturates: NEGATIVE ng/mL (ref <300)
Benzodiazepines: NEGATIVE ng/mL (ref <100)
Cocaine Metabolite: NEGATIVE ng/mL (ref <150)
Creatinine: 164.8 mg/dL (ref >=20.0)
Marijuana Metabolite: NEGATIVE ng/mL (ref <20)
Methadone Metabolite: NEGATIVE ng/mL (ref <100)
Opiates: NEGATIVE ng/mL (ref <100)
Oxidant: NEGATIVE ug/mL (ref <200)
Oxycodone: NEGATIVE ng/mL (ref <100)
pH: 5.6 (ref 4.5–9.0)

## 2021-06-02 LAB — ANTI-NUCLEAR AB-TITER (ANA TITER): ANA Titer 1: 1:40 {titer} — ABNORMAL HIGH

## 2021-06-02 LAB — COMPLETE METABOLIC PANEL WITH GFR
AG Ratio: 1.4 (calc) (ref 1.0–2.5)
ALT: 8 U/L (ref 6–29)
AST: 12 U/L (ref 10–35)
Albumin: 3.8 g/dL (ref 3.6–5.1)
Alkaline phosphatase (APISO): 55 U/L (ref 37–153)
BUN/Creatinine Ratio: 28 (calc) — ABNORMAL HIGH (ref 6–22)
BUN: 26 mg/dL — ABNORMAL HIGH (ref 7–25)
CO2: 25 mmol/L (ref 20–32)
Calcium: 8.9 mg/dL (ref 8.6–10.4)
Chloride: 109 mmol/L (ref 98–110)
Creat: 0.92 mg/dL (ref 0.60–1.00)
Globulin: 2.7 g/dL (calc) (ref 1.9–3.7)
Glucose, Bld: 93 mg/dL (ref 65–99)
Potassium: 3.8 mmol/L (ref 3.5–5.3)
Sodium: 139 mmol/L (ref 135–146)
Total Bilirubin: 0.2 mg/dL (ref 0.2–1.2)
Total Protein: 6.5 g/dL (ref 6.1–8.1)
eGFR: 65 mL/min/{1.73_m2} (ref >=60)

## 2021-06-02 LAB — DRUG MONITOR, TRAMADOL,QN, URINE
Desmethyltramadol: 6010 ng/mL — ABNORMAL HIGH (ref <100)
Tramadol: >10000 ng/mL — ABNORMAL HIGH (ref <100)

## 2021-06-02 LAB — ANA: Anti Nuclear Antibody (ANA): POSITIVE — AB

## 2021-06-02 LAB — ANTI-DNA ANTIBODY, DOUBLE-STRANDED: ds DNA Ab: 97 IU/mL — ABNORMAL HIGH

## 2021-06-02 LAB — SEDIMENTATION RATE: Sed Rate: 2 mm/h (ref 0–30)

## 2021-06-02 LAB — C3 AND C4
C3 Complement: 122 mg/dL (ref 83–193)
C4 Complement: 23 mg/dL (ref 15–57)

## 2021-06-02 LAB — VITAMIN D 25 HYDROXY (VIT D DEFICIENCY, FRACTURES): Vit D, 25-Hydroxy: 56 ng/mL (ref 30–100)

## 2021-06-02 NOTE — Progress Notes (Signed)
UDS is consistent with tramadol use.  CBC and CMP are normal.  Vitamin D is 56 which is normal.  ANA and double-stranded DNA are positive, double-stranded DNA titer is higher.  Complements and sed rate are normal.  I called patient and discussed lab results with her.  I advised her to get repeat labs in 3 months.  Please mail AVISE labs to her which she can obtain in 3 months.

## 2021-06-02 NOTE — Telephone Encounter (Signed)
Prolia received in the office via [employer] Placed in [place] #2.

## 2021-06-05 ENCOUNTER — Ambulatory Visit: Payer: Medicare HMO | Admitting: Physical Therapy

## 2021-06-05 NOTE — Progress Notes (Signed)
Please repeat AVISE only.

## 2021-06-06 ENCOUNTER — Ambulatory Visit (INDEPENDENT_AMBULATORY_CARE_PROVIDER_SITE_OTHER): Payer: Medicare HMO | Admitting: *Deleted

## 2021-06-06 ENCOUNTER — Other Ambulatory Visit: Payer: Self-pay

## 2021-06-06 VITALS — BP 142/82 | HR 71

## 2021-06-06 DIAGNOSIS — M810 Age-related osteoporosis without current pathological fracture: Secondary | ICD-10-CM | POA: Diagnosis not present

## 2021-06-06 MED ORDER — DENOSUMAB 60 MG/ML ~~LOC~~ SOSY
60.0000 mg | PREFILLED_SYRINGE | Freq: Once | SUBCUTANEOUS | Status: AC
Start: 2021-06-06 — End: 2021-06-06
  Administered 2021-06-06: 60 mg via SUBCUTANEOUS

## 2021-06-06 NOTE — Progress Notes (Signed)
Pharmacy Note  Subjective:   Patient presents to clinic today to receive bi-annual dose of Prolia.  Patient running a fever or have signs/symptoms of infection? No  Patient currently on antibiotics for the treatment of infection? No  Patient had fall in the last 6 months?  No  If yes, did it require medical attention? No   Patient taking calcium 1200 mg daily through diet or supplement and at least 800 units vitamin D? Yes  Objective: CMP     Component Value Date/Time   NA 139 05/30/2021 1107   K 3.8 05/30/2021 1107   CL 109 05/30/2021 1107   CO2 25 05/30/2021 1107   GLUCOSE 93 05/30/2021 1107   BUN 26 (H) 05/30/2021 1107   CREATININE 0.92 05/30/2021 1107   CALCIUM 8.9 05/30/2021 1107   PROT 6.5 05/30/2021 1107   ALBUMIN 3.9 10/04/2020 1136   AST 12 05/30/2021 1107   ALT 8 05/30/2021 1107   ALKPHOS 54 10/04/2020 1136   BILITOT 0.2 05/30/2021 1107   GFRNONAA 73 11/30/2020 1132   GFRAA 85 11/30/2020 1132    CBC    Component Value Date/Time   WBC 6.2 05/30/2021 1107   RBC 4.05 05/30/2021 1107   HGB 12.5 05/30/2021 1107   HCT 38.7 05/30/2021 1107   PLT 213 05/30/2021 1107   MCV 95.6 05/30/2021 1107   MCH 30.9 05/30/2021 1107   MCHC 32.3 05/30/2021 1107   RDW 12.4 05/30/2021 1107   LYMPHSABS 2,685 05/30/2021 1107   MONOABS 0.3 09/27/2009 2128   EOSABS 285 05/30/2021 1107   BASOSABS 31 05/30/2021 1107    Lab Results  Component Value Date   VD25OH 56 05/30/2021    T-score:  -2.9.  Assessment/Plan:   Administrations This Visit     denosumab (PROLIA) injection 60 mg     Admin Date 06/06/2021 Action Given Dose 60 mg Route Subcutaneous Administered By Carole Binning, LPN            Patient tolerated injection well.    All questions encouraged and answered.  Instructed patient to call with any further questions or concerns.

## 2021-06-08 ENCOUNTER — Ambulatory Visit: Payer: Medicare HMO | Admitting: Physical Therapy

## 2021-06-08 ENCOUNTER — Other Ambulatory Visit: Payer: Self-pay | Admitting: Internal Medicine

## 2021-06-08 DIAGNOSIS — Z1231 Encounter for screening mammogram for malignant neoplasm of breast: Secondary | ICD-10-CM

## 2021-06-21 ENCOUNTER — Ambulatory Visit
Admission: RE | Admit: 2021-06-21 | Discharge: 2021-06-21 | Disposition: A | Discharge status: Home / Self Care | Payer: Medicare HMO | Source: Ambulatory Visit | Attending: Internal Medicine | Admitting: Internal Medicine

## 2021-06-21 ENCOUNTER — Other Ambulatory Visit: Payer: Self-pay

## 2021-06-21 DIAGNOSIS — Z1231 Encounter for screening mammogram for malignant neoplasm of breast: Secondary | ICD-10-CM

## 2021-07-24 ENCOUNTER — Other Ambulatory Visit: Payer: Self-pay | Admitting: Rheumatology

## 2021-07-24 NOTE — Telephone Encounter (Signed)
Next Visit: 11/28/2020  Last Visit: 05/30/2021  UDS:05/30/2021 UDS is consistent with tramadol use  Narc Agreement: 05/30/2021  Last Fill: 03/27/2021 Tramadol 50 mg 1 to 2 tablets p.o. twice daily.  Okay to refill Tramadol?

## 2021-07-28 ENCOUNTER — Ambulatory Visit: Payer: Medicare HMO

## 2021-07-31 ENCOUNTER — Other Ambulatory Visit: Payer: Self-pay | Admitting: *Deleted

## 2021-07-31 MED ORDER — TOPIRAMATE 50 MG PO TABS: 150.0000 mg | ORAL_TABLET | Freq: Every day | ORAL | 0 refills | Status: DC

## 2021-07-31 NOTE — Telephone Encounter (Signed)
Next Visit: 11/28/2021  Last Visit: 05/30/2021  Last Fill: 7/15.2022  Dx: Primary insomnia  Current Dose per office note on 05/30/2021: Topamax 50 mg 3 tablets by mouth at bedtime.   Okay to refill Topamax?

## 2021-09-01 ENCOUNTER — Telehealth: Payer: Self-pay | Admitting: Rheumatology

## 2021-09-01 NOTE — Telephone Encounter (Signed)
Patient was to get [place] labs drawn this month, but the [facility] location has closed. Patient is not willing to drive to [place], or [place]. Please call patient to advise.

## 2021-09-01 NOTE — Telephone Encounter (Signed)
Patient advised she may go to [place], [place] [phone]. Patient states she will go to have them done there.

## 2021-10-13 ENCOUNTER — Other Ambulatory Visit: Payer: Self-pay | Admitting: Physician Assistant

## 2021-10-13 NOTE — Telephone Encounter (Signed)
Next Visit: 11/28/2021   Last Visit: 05/30/2021   Last Fill: 07/31/2021   Dx: Primary insomnia   Current Dose per office note on 05/30/2021: Topamax 50 mg 3 tablets by mouth at bedtime.    Okay to refill Topamax?

## 2021-11-14 NOTE — Progress Notes (Signed)
Office Visit Note  Patient: Paula Murphy             Date of Birth: July 14, 1946           MRN: [id number]             PCP: Arlyss Repress, MD Referring: Arlyss Repress, MD Visit Date: 11/28/2021 Occupation: @GUAROCC @  Subjective:  Discuss AVISE results   History of Present Illness: Paula Murphy is a 76 y.o. female with history of osteoporosis and fibromyalgia.  Patient reports that she continues to experience intermittent myalgias and muscle tenderness due to fibromyalgia.  She has ongoing discomfort due to trochanter bursitis of the left hip.  She had a cortisone injection performed on 10/19/2021 at her orthopedist office which provided relief for about 1 week.  She has been having aching in her trapezius muscles bilaterally.  She denies any recent muscle spasms.  Most of her discomfort is alleviated by taking tramadol for pain relief.  She states that both knee replacements are doing well overall.  She continues to have some difficulty ambulating, which she attributes to having a reaction to the covid-19 vaccine in the past. She denies any joint swelling at this time. She denies any recent falls or fractures.  She is taking vitamin D 5000 units daily.  She is not currently taking a calcium supplement.  Her last prolia injection was on 06/06/21.  She has tolerated Prolia without any side effects.  Her next prolia injection is due 12/03/21.     Activities of Daily Living:  Patient reports morning stiffness for 5-10 minutes.   Patient Reports nocturnal pain.  Difficulty dressing/grooming: Denies Difficulty climbing stairs: Reports Difficulty getting out of chair: Denies Difficulty using hands for taps, buttons, cutlery, and/or writing: Reports  Review of Systems  Constitutional:  Positive for fatigue.  HENT:  Negative for mouth sores, mouth dryness and nose dryness.   Eyes:  Positive for dryness. Negative for pain and itching.  Respiratory:  Negative for shortness of breath and  difficulty breathing.   Cardiovascular:  Negative for chest pain and palpitations.  Gastrointestinal:  Negative for blood in stool, constipation and diarrhea.  Endocrine: Negative for increased urination.  Genitourinary:  Negative for difficulty urinating.  Musculoskeletal:  Positive for myalgias, morning stiffness, muscle tenderness and myalgias. Negative for joint pain, joint pain and joint swelling.  Skin:  Negative for color change, rash and redness.  Allergic/Immunologic: Negative for susceptible to infections.  Neurological:  Positive for headaches. Negative for dizziness, numbness, memory loss and weakness.  Hematological:  Negative for bruising/bleeding tendency.  Psychiatric/Behavioral:  Negative for confusion.    PMFS History:  Patient Active Problem List   Diagnosis Date Noted   Primary osteoarthritis of left knee 10/10/2020   Ingrown toenail 02/02/2019   Primary osteoarthritis of both feet 03/21/2017   History of macular degeneration 03/21/2017   Other fatigue 03/21/2017   Age-related osteoporosis without current pathological fracture 03/21/2017   Esophageal reflux 06/29/2013   Actinic keratosis 04/11/2013   Systemic lupus erythematosus ([place]) 04/11/2013   OA (osteoarthritis) of knee 03/02/2013   DEPRESSION 07/24/2010   Major depressive disorder, single episode, unspecified 07/24/2010   UTI 07/04/2010   ABDOMINAL BLOATING 01/20/2010   Fibromyalgia 10/04/2009   INSOMNIA, CHRONIC 10/04/2009   Autoimmune disease ([place]) 10/04/2009   Vitamin D deficiency 10/26/2008   HYPOTHYROIDISM 08/13/2008   HYPERLIPIDEMIA 08/13/2008   HYPERTENSION 08/13/2008   HEADACHE 08/13/2008   Muscle pain 08/12/2008    Past Medical  History:  Diagnosis Date   Arthritis    OA AND PAIN RT KNEE   Autoimmune disease ([facility])    + ANA, and DS DNA--PT STATES SHE WAS TOLD SHE DOES NOT HAVE LUPUS AS PREVIOUSLY THOUGHT   Fibromyalgia    followed by Dr. Estanislado Pandy   Hemorrhoids    Hyperlipidemia     Hypothyroidism    Macular degeneration    BOTH EYES   PONV (postoperative nausea and vomiting)    Thyroid disease    Hypothyroidism    Family History  Problem Relation Age of Onset   Stroke Mother    Hypertension Mother    Hyperlipidemia Mother    Cancer Mother        Lung   Alzheimer's disease Mother    Stroke Father    Hypertension Father    Diabetes Father        Type II   Hyperlipidemia Father    Cancer Father        Lung   Arthritis Father        Rheumatoid   Thyroid disease Daughter    Breast cancer Cousin    Breast cancer Cousin    Past Surgical History:  Procedure Laterality Date   ABDOMINAL HYSTERECTOMY  1989   BUNIONECTOMY Left 2012   CHOLECYSTECTOMY  1996   EYE SURGERY     BILATERAL CATARACT EXTRACTION   KNEE ARTHROSCOPY  1994   Right Knee   KNEE SURGERY     NECK SURGERY  2003   TONSILLECTOMY  1953   TOTAL KNEE ARTHROPLASTY Right 03/02/2013   Procedure: RIGHT TOTAL KNEE ARTHROPLASTY;  Surgeon: Gearlean Alf, MD;  Location: [facility];  Service: Orthopedics;  Laterality: Right;   TOTAL KNEE ARTHROPLASTY Left 10/10/2020   Procedure: TOTAL KNEE ARTHROPLASTY;  Surgeon: Gaynelle Arabian, MD;  Location: [facility];  Service: Orthopedics;  Laterality: Left;  26min   Social History   Social History Narrative   Retired   Married   1 daughter/1 grandson   Never Smoked   Alcohol use - yes (social)   Immunization History  Administered Date(s) Administered   Influenza Whole 08/12/2008, 08/30/2009, 08/23/2010   PFIZER(Purple Top)SARS-COV-2 Vaccination 11/26/2019, 12/17/2019   Pneumococcal Polysaccharide-23 03/29/2009   Td 07/22/2007     Objective: Vital Signs: BP 124/75 (BP Location: Left Arm, Patient Position: Sitting, Cuff Size: Large)    Pulse 67    Ht 5' 0.5" (1.537 m)    Wt 162 lb 12.8 oz (73.8 kg)    BMI 31.27 kg/m    Physical Exam Vitals and nursing note reviewed.  Constitutional:      Appearance: She is well-developed.  HENT:     Head: Normocephalic  and atraumatic.  Eyes:     Conjunctiva/sclera: Conjunctivae normal.  Pulmonary:     Effort: Pulmonary effort is normal.  Abdominal:     Palpations: Abdomen is soft.  Musculoskeletal:     Cervical back: Normal range of motion.  Skin:    General: Skin is warm and dry.     Capillary Refill: Capillary refill takes less than 2 seconds.  Neurological:     Mental Status: She is alert and oriented to person, place, and time.  Psychiatric:        Behavior: Behavior normal.     Musculoskeletal Exam: Generalized hyperalgesia and positive tender points.  C-spine, thoracic spine, and lumbar spine have good ROM.  Trapezius muscle tension and tenderness bilaterally. Shoulder joints, elbow joints, wrist joints, MCPs, PIPs,  and DIPs good ROM with no synovitis.  Complete fist formation bilaterally.  Hip joints have slightly limited ROM.  Tenderness over the left trochanteric bursa.  Bilateral knee replacements have good ROM.  Ankle joints have good ROM with no joint tenderness.  Pedal edema noted bilaterally.   CDAI Exam: CDAI Score: -- Patient Global: --; Provider Global: -- Swollen: --; Tender: -- Joint Exam 11/28/2021   No joint exam has been documented for this visit   There is currently no information documented on the homunculus. Go to the Rheumatology activity and complete the homunculus joint exam.  Investigation: No additional findings.  Imaging: No results found.  Recent Labs: Lab Results  Component Value Date   WBC 6.2 05/30/2021   HGB 12.5 05/30/2021   PLT 213 05/30/2021   NA 139 05/30/2021   K 3.8 05/30/2021   CL 109 05/30/2021   CO2 25 05/30/2021   GLUCOSE 93 05/30/2021   BUN 26 (H) 05/30/2021   CREATININE 0.92 05/30/2021   BILITOT 0.2 05/30/2021   ALKPHOS 54 10/04/2020   AST 12 05/30/2021   ALT 8 05/30/2021   PROT 6.5 05/30/2021   ALBUMIN 3.9 10/04/2020   CALCIUM 8.9 05/30/2021   GFRAA 85 11/30/2020    Speciality Comments: Prolia: 02/07/18, 08/19/18, 10/12/19,  04/11/20, 12/06/20, 06/04/21  Procedures:  No procedures performed Allergies: Codeine and Hydrocodone     Assessment / Plan:     Visit Diagnoses: Age-related osteoporosis without current pathological fracture - DEXA 10/22/19 BMD 0.525 with T-score -2.9. She was previously on fosamax and then was started on prolia in April 2019. Last prolia injection: 06/06/2021.  She has been tolerating Prolia without any side effects or injection site reactions.  She has been taking vitamin D 5000 units daily but has not been taking a calcium supplement.  Her next Prolia injection is due on 12/03/2021.  CBC, CMP, and vitamin D level will be updated today prior to her next prolia injection on 12/03/21.   She has not had any recent falls or fractures.  She is due to update bone density.  Order for DEXA was placed today - Plan: VITAMIN D 25 Hydroxy (Vit-D Deficiency, Fractures), DG BONE DENSITY (DXA)  Vitamin D deficiency: She is taking vitamin D 5000 units daily.  Vitamin D level will be checked today prior to scheduling her next Prolia injection.  Medication monitoring encounter - She takes tramadol 50 mg 2-4 tablets daily for pain management. UDS & narcotic agreement updated today on 11/28/21. - Plan: CBC with Differential/Platelet, COMPLETE METABOLIC PANEL WITH GFR, DRUG MONITOR, TRAMADOL,QN, URINE, DRUG MONITOR, PANEL 5, W/CONF, URINE  Status post total bilateral knee replacement - RTR-02/03/2013, and LTR-10/10/20 by Dr. Maureen Ralphs.  Doing well overall.  She has good range of motion of both knee replacements on examination today with no warmth or effusion.  She had a recent follow-up visit with her orthopedist for the 1 year follow-up of her left knee replacement.  She continues to have some difficulty with ambulation but does not feel as though it is due to her knee replacements.  She has not had any recent falls.  Discussed the importance of lower extremity muscle strengthening and fall prevention.  She was strongly  encouraged to return to Silver sneakers.  Primary osteoarthritis of both feet: She is not experiencing any increased discomfort in the feet at this time.  She was wearing proper fitting shoes.  Trochanteric bursitis of left hip: She has ongoing tenderness palpation over the left trochanteric  bursa.  She had a cortisone injection performed on 10/19/2021 at her orthopedist office.  She had relief for about 1 week.  Discussed the importance of performing stretching exercises on a daily basis.  Discussed that she would likely benefit from physical therapy.  Fibromyalgia: She experiences intermittent myalgias and muscle tenderness due to fibromyalgia.  She remains on Topamax as prescribed and takes tramadol 1 to 2 tablets twice daily as needed for pain relief.  Most of her discomfort has been in the trapezius muscles bilaterally.  She is also been having ongoing discomfort due to trochanter bursitis of the left hip.  Discussed the importance of performing stretching exercises on a daily basis.  She was strongly encouraged to return to Silver sneakers.  Discussed the importance of regular exercise and good sleep hygiene.  Primary insomnia -She remains on Topamax 50 mg 3 tablets by mouth at bedtime.   Other fatigue: Chronic and secondary to insomnia.  Discussed the importance of regular exercise.  Positive ANA (antinuclear antibody): AVISE results 09/06/21: Equivocal index 0.4.  dsDNA is equivocal, ANA 1:160NS, antithyroglobulin IgG is positive, anti-C1q IgG positive, and anti-phosphatidylserine/PT IgM positive.   Results were discussed with the patient in detail today and all questions were addressed.  Recommended repeating AVISE labs prior to her next follow up visit. She will return in 3 months to discuss results.   Other medical conditions are listed as follows:   Lymphedema  History of hypothyroidism  History of depression  History of hyperlipidemia  History of macular degeneration  History  of hypertension: BP was 124/75 today in the office.   History of anxiety  Orders: Orders Placed This Encounter  Procedures   DG BONE DENSITY (DXA)   VITAMIN D 25 Hydroxy (Vit-D Deficiency, Fractures)   CBC with Differential/Platelet   COMPLETE METABOLIC PANEL WITH GFR   DRUG MONITOR, TRAMADOL,QN, URINE   DRUG MONITOR, PANEL 5, W/CONF, URINE   No orders of the defined types were placed in this encounter.    Follow-Up Instructions: Return in about 3 months (around 02/26/2022) for Osteoporosis, Fibromyalgia.   Ofilia Neas, PA-C  Note - This record has been created using Dragon software.  Chart creation errors have been sought, but may not always  have been located. Such creation errors do not reflect on  the standard of medical care.

## 2021-11-21 ENCOUNTER — Other Ambulatory Visit: Payer: Self-pay

## 2021-11-21 MED ORDER — TRAMADOL HCL 50 MG PO TABS: ORAL_TABLET | ORAL | 0 refills | Status: DC

## 2021-11-21 NOTE — Telephone Encounter (Signed)
Paula Murphy from Paula Murphy left a voicemail stating they received a prescription request for Tramadol 50 mg.   Phone [phone]   Fax [phone]

## 2021-11-21 NOTE — Telephone Encounter (Signed)
Next Visit: 11/28/2020   Last Visit: 05/30/2021   UDS:05/30/2021 UDS is consistent with tramadol use   Narc Agreement: 05/30/2021   Last Fill: 07/24/2021 Tramadol 50 mg 1 to 2 tablets p.o. twice daily.   Okay to refill Tramadol?

## 2021-11-27 ENCOUNTER — Other Ambulatory Visit (HOSPITAL_COMMUNITY): Payer: Self-pay

## 2021-11-27 ENCOUNTER — Telehealth: Payer: Self-pay | Admitting: Pharmacist

## 2021-11-27 DIAGNOSIS — M810 Age-related osteoporosis without current pathological fracture: Secondary | ICD-10-CM

## 2021-11-27 NOTE — Telephone Encounter (Signed)
Patient due for Prolia on or after 12/03/21. She has OV tomorrow, 11/28/21 and can have labs drawn at that visit.  Patient was previously enrolled in grant which expired 11/13/21. Copay through [facility] (pharmacy benefit) is $95. No osteoporosis grants currently available to enroll patient.  [facility] provider services regarding coverage of Prolia [phone], [phone]. Patient's plan is active as of 11/05/21 with no end date. Patient has no deductible. She is responsible for 20% co-insurance until she meets maximum out-of-pocket of $3400. As of 11/27/21, patient has not accumulated any amount.  Ref # [id number] Phone: [phone]  Knox Saliva, PharmD, MPH, BCPS Clinical Pharmacist (Rheumatology and Pulmonology)  Pre-authorization is required. [phone]

## 2021-11-28 ENCOUNTER — Encounter: Payer: Self-pay | Admitting: Physician Assistant

## 2021-11-28 ENCOUNTER — Ambulatory Visit: Payer: Medicare HMO | Admitting: Physician Assistant

## 2021-11-28 ENCOUNTER — Other Ambulatory Visit: Payer: Self-pay

## 2021-11-28 ENCOUNTER — Encounter (INDEPENDENT_AMBULATORY_CARE_PROVIDER_SITE_OTHER): Payer: Self-pay

## 2021-11-28 VITALS — BP 124/75 | HR 67 | Ht 60.5 in | Wt 162.8 lb

## 2021-11-28 DIAGNOSIS — E559 Vitamin D deficiency, unspecified: Secondary | ICD-10-CM | POA: Diagnosis not present

## 2021-11-28 DIAGNOSIS — Z96653 Presence of artificial knee joint, bilateral: Secondary | ICD-10-CM

## 2021-11-28 DIAGNOSIS — F5101 Primary insomnia: Secondary | ICD-10-CM

## 2021-11-28 DIAGNOSIS — M810 Age-related osteoporosis without current pathological fracture: Secondary | ICD-10-CM

## 2021-11-28 DIAGNOSIS — M19072 Primary osteoarthritis, left ankle and foot: Secondary | ICD-10-CM

## 2021-11-28 DIAGNOSIS — Z8659 Personal history of other mental and behavioral disorders: Secondary | ICD-10-CM

## 2021-11-28 DIAGNOSIS — Z5181 Encounter for therapeutic drug level monitoring: Secondary | ICD-10-CM | POA: Diagnosis not present

## 2021-11-28 DIAGNOSIS — Z8679 Personal history of other diseases of the circulatory system: Secondary | ICD-10-CM

## 2021-11-28 DIAGNOSIS — Z8639 Personal history of other endocrine, nutritional and metabolic disease: Secondary | ICD-10-CM

## 2021-11-28 DIAGNOSIS — M19071 Primary osteoarthritis, right ankle and foot: Secondary | ICD-10-CM

## 2021-11-28 DIAGNOSIS — R768 Other specified abnormal immunological findings in serum: Secondary | ICD-10-CM

## 2021-11-28 DIAGNOSIS — M7062 Trochanteric bursitis, left hip: Secondary | ICD-10-CM

## 2021-11-28 DIAGNOSIS — M797 Fibromyalgia: Secondary | ICD-10-CM

## 2021-11-28 DIAGNOSIS — I890 Lymphedema, not elsewhere classified: Secondary | ICD-10-CM

## 2021-11-28 DIAGNOSIS — R7689 Other specified abnormal immunological findings in serum: Secondary | ICD-10-CM

## 2021-11-28 DIAGNOSIS — R5383 Other fatigue: Secondary | ICD-10-CM

## 2021-11-28 DIAGNOSIS — Z8669 Personal history of other diseases of the nervous system and sense organs: Secondary | ICD-10-CM

## 2021-11-28 NOTE — Telephone Encounter (Signed)
Spoke with patient regarding Prolia at [place] today. She will have labs drawn today (CBC, CMP, and Vitamin D).  Reviewed coverage - she would like to get Prolia in the clinic and is comfortable with $95 copay. Requested she be kept on grant wait list if possible.  Knox Saliva, PharmD, MPH, BCPS Clinical Pharmacist (Rheumatology and Pulmonology)

## 2021-11-29 ENCOUNTER — Other Ambulatory Visit (HOSPITAL_COMMUNITY): Payer: Self-pay

## 2021-11-29 MED ORDER — PROLIA 60 MG/ML ~~LOC~~ SOSY
60.0000 mg | PREFILLED_SYRINGE | SUBCUTANEOUS | 0 refills | Status: DC
  Filled 2021-11-29 (×2): qty 1, 180d supply, fill #0

## 2021-11-29 NOTE — Progress Notes (Signed)
CBC and CMP WNL.  Vitamin D WNL.  Ok to schedule prolia injection.

## 2021-11-29 NOTE — Telephone Encounter (Signed)
CBC, CMP, and Vitamin D wnl to proceed with Prolia. Rx sent to [facility] today to be couriered to clinic prior to appt on 12/06/21  Knox Saliva, PharmD, MPH, BCPS Clinical Pharmacist (Rheumatology and Pulmonology)

## 2021-11-29 NOTE — Telephone Encounter (Signed)
Delivery instructions have been updated in [place], medication will be couriered to [facility] by 12/05/2021.  Rx has been processed in [facility] and there is a copay of $95. Payment information has been collected and forwarded to the pharmacy.

## 2021-11-30 ENCOUNTER — Emergency Department (HOSPITAL_BASED_OUTPATIENT_CLINIC_OR_DEPARTMENT_OTHER)
Admission: EM | Admit: 2021-11-30 | Discharge: 2021-11-30 | Disposition: A | Discharge status: Home / Self Care | Payer: Medicare HMO | Attending: Emergency Medicine | Admitting: Emergency Medicine

## 2021-11-30 ENCOUNTER — Emergency Department (HOSPITAL_BASED_OUTPATIENT_CLINIC_OR_DEPARTMENT_OTHER): Payer: Medicare HMO

## 2021-11-30 ENCOUNTER — Other Ambulatory Visit: Payer: Self-pay

## 2021-11-30 ENCOUNTER — Other Ambulatory Visit (HOSPITAL_COMMUNITY): Payer: Self-pay

## 2021-11-30 ENCOUNTER — Emergency Department (HOSPITAL_BASED_OUTPATIENT_CLINIC_OR_DEPARTMENT_OTHER): Payer: Medicare HMO | Admitting: Radiology

## 2021-11-30 ENCOUNTER — Encounter (HOSPITAL_BASED_OUTPATIENT_CLINIC_OR_DEPARTMENT_OTHER): Payer: Self-pay

## 2021-11-30 DIAGNOSIS — I7143 Infrarenal abdominal aortic aneurysm, without rupture: Secondary | ICD-10-CM | POA: Diagnosis not present

## 2021-11-30 DIAGNOSIS — E039 Hypothyroidism, unspecified: Secondary | ICD-10-CM | POA: Insufficient documentation

## 2021-11-30 DIAGNOSIS — R0789 Other chest pain: Secondary | ICD-10-CM | POA: Diagnosis present

## 2021-11-30 DIAGNOSIS — N201 Calculus of ureter: Secondary | ICD-10-CM | POA: Diagnosis not present

## 2021-11-30 DIAGNOSIS — R197 Diarrhea, unspecified: Secondary | ICD-10-CM | POA: Insufficient documentation

## 2021-11-30 DIAGNOSIS — R109 Unspecified abdominal pain: Secondary | ICD-10-CM

## 2021-11-30 LAB — BASIC METABOLIC PANEL
Anion gap: 11 (ref 5–15)
BUN: 20 mg/dL (ref 8–23)
CO2: 23 mmol/L (ref 22–32)
Calcium: 9 mg/dL (ref 8.9–10.3)
Chloride: 110 mmol/L (ref 98–111)
Creatinine, Ser: 1.01 mg/dL — ABNORMAL HIGH (ref 0.44–1.00)
GFR, Estimated: 58 mL/min — ABNORMAL LOW (ref >60)
Glucose, Bld: 98 mg/dL (ref 70–99)
Potassium: 3.5 mmol/L (ref 3.5–5.1)
Sodium: 144 mmol/L (ref 135–145)

## 2021-11-30 LAB — URINALYSIS, ROUTINE W REFLEX MICROSCOPIC
Bilirubin Urine: NEGATIVE
Glucose, UA: NEGATIVE mg/dL
Ketones, ur: NEGATIVE mg/dL
Leukocytes,Ua: NEGATIVE
Nitrite: NEGATIVE
Protein, ur: NEGATIVE mg/dL
RBC / HPF: >50 RBC/hpf — ABNORMAL HIGH (ref 0–5)
Specific Gravity, Urine: 1.027 (ref 1.005–1.030)
pH: 5 (ref 5.0–8.0)

## 2021-11-30 LAB — HEPATIC FUNCTION PANEL
ALT: 8 U/L (ref 0–44)
AST: 14 U/L — ABNORMAL LOW (ref 15–41)
Albumin: 3.7 g/dL (ref 3.5–5.0)
Alkaline Phosphatase: 54 U/L (ref 38–126)
Bilirubin, Direct: 0.1 mg/dL (ref 0.0–0.2)
Indirect Bilirubin: 0.5 mg/dL (ref 0.3–0.9)
Total Bilirubin: 0.6 mg/dL (ref 0.3–1.2)
Total Protein: 6.4 g/dL — ABNORMAL LOW (ref 6.5–8.1)

## 2021-11-30 LAB — LIPASE, BLOOD: Lipase: <10 U/L — ABNORMAL LOW (ref 11–51)

## 2021-11-30 LAB — CBC
HCT: 41.4 % (ref 36.0–46.0)
Hemoglobin: 12.8 g/dL (ref 12.0–15.0)
MCH: 31 pg (ref 26.0–34.0)
MCHC: 30.9 g/dL (ref 30.0–36.0)
MCV: 100.2 fL — ABNORMAL HIGH (ref 80.0–100.0)
Platelets: 169 10*3/uL (ref 150–400)
RBC: 4.13 MIL/uL (ref 3.87–5.11)
RDW: 13.5 % (ref 11.5–15.5)
WBC: 2.7 10*3/uL — ABNORMAL LOW (ref 4.0–10.5)
nRBC: 0 % (ref 0.0–0.2)

## 2021-11-30 LAB — TROPONIN I (HIGH SENSITIVITY)
Troponin I (High Sensitivity): 3 ng/L (ref <18)
Troponin I (High Sensitivity): 4 ng/L (ref <18)

## 2021-11-30 MED ORDER — ONDANSETRON HCL 4 MG/2ML IJ SOLN
4.0000 mg | Freq: Once | INTRAMUSCULAR | Status: AC
Start: 2021-11-30 — End: 2021-11-30
  Administered 2021-11-30: 4 mg via INTRAVENOUS

## 2021-11-30 MED ORDER — IOHEXOL 350 MG/ML SOLN
85.0000 mL | Freq: Once | INTRAVENOUS | Status: AC | PRN
  Administered 2021-11-30: 85 mL via INTRAVENOUS

## 2021-11-30 MED ORDER — TAMSULOSIN HCL 0.4 MG PO CAPS: 0.4000 mg | ORAL_CAPSULE | Freq: Every day | ORAL | 0 refills | Status: DC

## 2021-11-30 MED ORDER — LOPERAMIDE HCL 2 MG PO CAPS: 2.0000 mg | ORAL_CAPSULE | Freq: Four times a day (QID) | ORAL | 0 refills | Status: AC | PRN

## 2021-11-30 MED ORDER — HYDROMORPHONE HCL 1 MG/ML IJ SOLN
0.5000 mg | Freq: Once | INTRAMUSCULAR | Status: AC
  Administered 2021-11-30: 0.5 mg via INTRAVENOUS
  Filled 2021-11-30: qty 1

## 2021-11-30 MED ORDER — ONDANSETRON 4 MG PO TBDP: 4.0000 mg | ORAL_TABLET | Freq: Three times a day (TID) | ORAL | 0 refills | Status: AC | PRN

## 2021-11-30 NOTE — Discharge Instructions (Addendum)
You were seen in the emergency room today with chest and abdominal/back pain.  Your CT scan did not show any ripping or tearing of the aorta or other cause for your chest pain such as heart attack.  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  Your primary care doctor can help to schedule this if you discussed this with them.  We did find a small kidney stone on the left which should pass without intervention.  I am listing the name of a urologist to call for follow-up.  You may continue taking your pain medications.  Have also called in some other supportive care medicines.

## 2021-11-30 NOTE — ED Triage Notes (Signed)
Patient here POV from Home with Family.  Acute Onset of CP, Back Pain, and Torso Pain this AM at 0900. Associated with Nausea and Vomiting and more recently Diarrhea.   No Active CP.  Active Vomiting during Triage. A&Ox4. GCS 15. BIB Wheelchair.

## 2021-11-30 NOTE — ED Notes (Signed)
Pt encouraged to provide urine sample.

## 2021-11-30 NOTE — ED Notes (Signed)
Pt d/c home per MD order. Discharge summary reviewed, pt verbalizes understanding. Off unit via WC. No s/s of acute distress noted. Pt daughter is discharge ride home.

## 2021-11-30 NOTE — ED Notes (Signed)
Patient in restroom.  Unable to obtain vitals at this time.  Patient having multiple bouts of diarrhea.

## 2021-11-30 NOTE — ED Notes (Signed)
Patient transported to CT 

## 2021-11-30 NOTE — ED Provider Notes (Signed)
Emergency Department Provider Note   I have reviewed the triage vital signs and the nursing notes.   HISTORY  Chief Complaint Emesis   HPI Paula Murphy is a 76 y.o. female with past medical history reviewed below presents to the emergency department for evaluation of acute onset chest pain now radiating into the left flank and back.  Symptoms began at 9 AM without clear provocation.  She had associated nausea vomiting along with some nonbloody diarrhea.  She is not currently having chest pain and states mainly her pain is sharp and severe in her left flank.  No urinary tract infection symptoms. No modifying factors.    Past Medical History:  Diagnosis Date   Arthritis    OA AND PAIN RT KNEE   Autoimmune disease (HCC)    + ANA, and DS DNA--PT STATES SHE WAS TOLD SHE DOES NOT HAVE LUPUS AS PREVIOUSLY THOUGHT   Fibromyalgia    followed by Dr. Estanislado Pandy   Hemorrhoids    Hyperlipidemia    Hypothyroidism    Macular degeneration    BOTH EYES   PONV (postoperative nausea and vomiting)    Thyroid disease    Hypothyroidism    Review of Systems  Constitutional: No fever/chills Eyes: No visual changes. ENT: No sore throat. Cardiovascular: Positive chest pain. Respiratory: Denies shortness of breath. Gastrointestinal: Positive left flank/abdominal pain.  Positive nausea, vomiting, and diarrhea.  No constipation. Genitourinary: Negative for dysuria. Musculoskeletal: Positive for back pain. Skin: Negative for rash. Neurological: Negative for headaches, focal weakness or numbness.   ____________________________________________   PHYSICAL EXAM:  VITAL SIGNS: ED Triage Vitals  Enc Vitals Group     BP 11/30/21 1131 (!) 166/89     Pulse Rate 11/30/21 1131 89     Resp 11/30/21 1131 16     Temp 11/30/21 1131 (!) 97.5 F (36.4 C)     Temp Source 11/30/21 1131 Oral     SpO2 11/30/21 1131 100 %     Weight 11/30/21 1121 162 lb 11.2 oz (73.8 kg)     Height 11/30/21 1121 5'  0.5" (1.537 m)   Constitutional: Alert and oriented. Patient appears uncomfortable.  Eyes: Conjunctivae are normal.  Head: Atraumatic. Nose: No congestion/rhinnorhea. Mouth/Throat: Mucous membranes are moist.  Neck: No stridor.  Cardiovascular: Normal rate, regular rhythm. Good peripheral circulation. Grossly normal heart sounds.   Respiratory: Normal respiratory effort.  No retractions. Lungs CTAB. Gastrointestinal: Soft and nontender. No distention.  Musculoskeletal: No lower extremity tenderness nor edema. No gross deformities of extremities. Neurologic:  Normal speech and language. No gross focal neurologic deficits are appreciated.  Skin:  Skin is warm, dry and intact. No rash noted.   ____________________________________________   LABS (all labs ordered are listed, but only abnormal results are displayed)  Labs Reviewed  BASIC METABOLIC PANEL - Abnormal; Notable for the following components:      Result Value   Creatinine, Ser 1.01 (*)    GFR, Estimated 58 (*)    All other components within normal limits  CBC - Abnormal; Notable for the following components:   WBC 2.7 (*)    MCV 100.2 (*)    All other components within normal limits  HEPATIC FUNCTION PANEL - Abnormal; Notable for the following components:   Total Protein 6.4 (*)    AST 14 (*)    All other components within normal limits  LIPASE, BLOOD - Abnormal; Notable for the following components:   Lipase <10 (*)  All other components within normal limits  URINALYSIS, ROUTINE W REFLEX MICROSCOPIC - Abnormal; Notable for the following components:   Color, Urine COLORLESS (*)    Hgb urine dipstick LARGE (*)    RBC / HPF >50 (*)    All other components within normal limits  TROPONIN I (HIGH SENSITIVITY)  TROPONIN I (HIGH SENSITIVITY)   ____________________________________________  EKG   EKG Interpretation  Date/Time:  Thursday November 30 2021 11:07:42 EST Ventricular Rate:  87 PR Interval:  188 QRS  Duration: 88 QT Interval:  376 QTC Calculation: 452 R Axis:   -47 Text Interpretation: Normal sinus rhythm Left axis deviation Low voltage QRS Cannot rule out Anterior infarct (cited on or before 11-Mar-2002) Abnormal ECG When compared with ECG of 11-Mar-2002 14:04, QRS axis Shifted left Nonspecific T wave abnormality now evident in Lateral leads but no recent tracing for comparison Confirmed by Nanda Quinton [phone]) on 11/30/2021 11:18:11 AM        ____________________________________________  RADIOLOGY  DG Chest 2 View  Result Date: 11/30/2021 CLINICAL DATA:  Chest pain, back pain, torso pain, nausea, vomiting, diarrhea EXAM: CHEST - 2 VIEW COMPARISON:  01/20/2010, 09/05/2007 FINDINGS: Normal heart size and pulmonary vascularity. Density at RIGHT cardiophrenic angle increased since previous exam. Lungs clear. No infiltrate, pleural effusion, or pneumothorax. 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 IMPRESSION: Density at RIGHT cardiophrenic angle, more prominent than on previous studies from 2011, may represent a prominent epicardial fat pad but mass not completely excluded; CT chest recommended to exclude tumor. Electronically Signed   By: Lavonia Dana M.D.   On: 11/30/2021 12:11   CT Angio Chest/Abd/Pel for Dissection W and/or Wo Contrast  Result Date: 11/30/2021 CLINICAL DATA:  Aortic aneurysm, known or suspected EXAM: CT ANGIOGRAPHY CHEST, ABDOMEN AND PELVIS TECHNIQUE: Non-contrast CT of the chest was initially obtained. Multidetector CT imaging through the chest, abdomen and pelvis was performed using the standard protocol during bolus administration of intravenous contrast. Multiplanar reconstructed images and MIPs were obtained and reviewed to evaluate the vascular anatomy. RADIATION DOSE REDUCTION: This exam was performed according to the departmental dose-optimization program which includes automated exposure control, adjustment of the mA and/or kV according to  patient size and/or use of iterative reconstruction technique. CONTRAST:  11mL OMNIPAQUE IOHEXOL 350 MG/ML SOLN COMPARISON:  None. FINDINGS: CTA CHEST Cardiovascular: Preferential opacification of the thoracic aorta. No evidence of thoracic aortic aneurysm or dissection. The pulmonary arteries are normal in caliber without central filling defect. Normal heart size. No pericardial effusion. Mediastinum/Nodes: No enlarged mediastinal, hilar, or axillary lymph nodes. Lungs/Pleura: No pleural effusion. No pneumothorax. No mass or focal consolidation. No suspicious pulmonary nodules. CTA ABDOMEN AND PELVIS VASCULAR Aorta: Infrarenal abdominal aortic aneurysm measures 3.2 x 3.1 cm. No findings of abdominal aortic dissection. Scattered calcified atherosclerotic plaques. Celiac: Patent without evidence of aneurysm, dissection, vasculitis or significant stenosis. SMA: Patent without evidence of aneurysm, dissection, vasculitis or significant stenosis. Note is made of a replaced right hepatic artery arising from the SMA. IMA: Patent. Renals: Both renal arteries are patent without evidence of aneurysm, dissection, vasculitis, fibromuscular dysplasia or significant stenosis. Single renal arteries bilaterally. Inflow: Patent without evidence of aneurysm, dissection, vasculitis or significant stenosis. Veins: No obvious venous abnormality within the limitations of this arterial phase study. NON-VASCULAR Hepatobiliary: The liver is normal in size without focal abnormality. No intrahepatic or extrahepatic biliary ductal dilation. Gallbladder surgically absent. Spleen: Normal in size without focal abnormality. Pancreas: Diffuse fatty replacement of the pancreas. No  mass lesion identified. Adrenals/Urinary Tract: Adrenal glands are unremarkable. Kidneys are normal in size without focal lesion. No right hydronephrosis. There is a punctate 2 mm calcification overlying the left distal ureter, with mild left-sided hydronephrosis. Bladder  is unremarkable. Stomach/Bowel: The stomach, small bowel and large bowel are normal in caliber without abnormal wall thickening or surrounding inflammatory changes. Small hiatal hernia. Reproductive: Status post hysterectomy. No adnexal masses. Lymphatic: No enlarged lymph nodes in the abdomen or pelvis. Other: No abdominopelvic ascites. Musculoskeletal: No aggressive osseous lesions. Degenerative changes of the midthoracic and lower lumbar spine. The soft tissues are unremarkable. Review of the MIP images confirms the above findings. IMPRESSION: Vascular: 1. No findings of aortic dissection or other acute vascular pathology. 2. There is a infrarenal abdominal aortic aneurysm which measures 3.2 cm in size. Recommend follow-up ultrasound every 3 years. This recommendation follows ACR consensus guidelines: White Paper of the ACR Incidental Findings Committee II on Vascular Findings. J Am Coll Radiol 2013; 10:789-794. Nonvascular: 1. There is a small 2 mm calcification overlying the left distal ureter which appears to result in mild left-sided hydronephrosis, suspicious for a partially obstructing left ureteral stone (see key image). These results were called by telephone at the time of interpretation on 11/30/2021 at 12:55 pm to provider Montana Bryngelson , who verbally acknowledged these results. Electronically Signed   By: Albin Felling M.D.   On: 11/30/2021 12:59    ____________________________________________   PROCEDURES  Procedure(s) performed:   Procedures  None ____________________________________________   INITIAL IMPRESSION / ASSESSMENT AND PLAN / ED COURSE  Pertinent labs & imaging results that were available during my care of the patient were reviewed by me and considered in my medical decision making (see chart for details).   This patient is Presenting for Evaluation of CP, which does require a range of treatment options, and is a complaint that involves a high risk of morbidity and  mortality.  The Differential Diagnoses includes all life-threatening causes for chest pain. This includes but is not exclusive to acute coronary syndrome, aortic dissection, pulmonary embolism, cardiac tamponade, community-acquired pneumonia, pericarditis, musculoskeletal chest wall pain, etc.   Critical Interventions- IV pain medication, nausea medication, and IVF   Medications  ondansetron (ZOFRAN) injection 4 mg (4 mg Intravenous Given 11/30/21 1130)  HYDROmorphone (DILAUDID) injection 0.5 mg (0.5 mg Intravenous Given 11/30/21 1244)  iohexol (OMNIPAQUE) 350 MG/ML injection 85 mL (85 mLs Intravenous Contrast Given 11/30/21 1222)    Reassessment after intervention: Patient pain is reduced and looking more comfortable.   I did obtain Additional Historical Information from family at bedside who confirm symptom onset and timeline.   I decided to review pertinent External Data, and in summary patient with no recent ED visits for the same.   Clinical Laboratory Tests Ordered, included troponin which is in normal limits.  LFTs and bilirubin which are normal.  No leukocytosis.  Lipase is within normal limits.  No acute kidney injury or electrolyte disturbance.  No evidence of urinary tract infection although large amount of hemoglobin noted on UA.   Radiologic Tests Ordered, included CTA dissection study and CXR. I independently interpreted the images and agree with radiology interpretation.   Cardiac Monitor Tracing which shows NSR   Social Determinants of Health Risk non-smoker  Reevaluation with update and discussion with patient and family at bedside.  She is feeling much improved.  Discussed results of the CTA dissection study.  Plan to treat empirically for ureteral stone.  No return of chest  pain.  EKG and troponins are reassuring.   Consult complete with radiology to discuss CTA results. No dissection. Question a partially obstructing left ureteral stone.   Medical Decision Making:  Summary:  Patient presents emergency department with acute onset chest pain which then migrated down into the back and flank area.  Patient has few risk factors for aortic dissection although given the quality, onset, radiation of pain decided to pursue imaging to evaluate for dissection.  She does have a small AAA which was discussed with the patient.  Advised that she consult with her PCP to schedule surveillance/follow-up of this finding.  This was included in the after visit summary as well.  Suspect the patient's pain is caused by a partially obstructing left ureteral stone which correlates with her current symptoms in the emergency department.  Pain is well controlled.  No evidence of infection on UA.  Plan for pain management and supportive care at home with urology follow-up.   Disposition: discharge  ____________________________________________  FINAL CLINICAL IMPRESSION(S) / ED DIAGNOSES  Final diagnoses:  Atypical chest pain  Left flank pain  Left ureteral stone  Infrarenal abdominal aortic aneurysm (AAA) without rupture     NEW OUTPATIENT MEDICATIONS STARTED DURING THIS VISIT:  Discharge Medication List as of 11/30/2021  2:58 PM     START taking these medications   Details  loperamide (IMODIUM) 2 MG capsule Take 1 capsule (2 mg total) by mouth 4 (four) times daily as needed for diarrhea or loose stools., Starting Thu 11/30/2021, Normal    tamsulosin (FLOMAX) 0.4 MG CAPS capsule Take 1 capsule (0.4 mg total) by mouth daily., Starting Thu 11/30/2021, Normal        Note:  This document was prepared using Dragon voice recognition software and may include unintentional dictation errors.  Nanda Quinton, MD, [facility] Emergency Medicine    Joniyah Mallinger, [facility], MD 12/01/21 [phone]

## 2021-12-01 ENCOUNTER — Other Ambulatory Visit (HOSPITAL_COMMUNITY): Payer: Self-pay

## 2021-12-01 NOTE — Telephone Encounter (Signed)
Prolia received in the office and placed in the fridge.

## 2021-12-06 ENCOUNTER — Other Ambulatory Visit: Payer: Self-pay

## 2021-12-06 ENCOUNTER — Ambulatory Visit (INDEPENDENT_AMBULATORY_CARE_PROVIDER_SITE_OTHER): Payer: Medicare HMO | Admitting: Pharmacist

## 2021-12-06 VITALS — BP 117/77 | HR 93

## 2021-12-06 DIAGNOSIS — G8929 Other chronic pain: Secondary | ICD-10-CM

## 2021-12-06 DIAGNOSIS — Z5181 Encounter for therapeutic drug level monitoring: Secondary | ICD-10-CM

## 2021-12-06 DIAGNOSIS — M810 Age-related osteoporosis without current pathological fracture: Secondary | ICD-10-CM

## 2021-12-06 MED ORDER — DENOSUMAB 60 MG/ML ~~LOC~~ SOSY
60.0000 mg | PREFILLED_SYRINGE | Freq: Once | SUBCUTANEOUS | Status: AC
  Administered 2021-12-06: 60 mg via SUBCUTANEOUS
  Filled 2021-12-06: qty 1

## 2021-12-06 NOTE — Progress Notes (Signed)
Pharmacy Note  Subjective:   Patient presents to clinic today to receive bi-annual dose of Prolia. Last dose 05/31/21. Patient had kidney stone last week that she has passed and is feeling better  Patient running a fever or have signs/symptoms of infection? No  Patient currently on antibiotics for the treatment of infection? No  Patient had fall in the last 6 months?  No   Patient taking calcium 1200 mg daily through diet or supplement and at least 800 units vitamin D? Yes  Objective: CMP     Component Value Date/Time   NA 144 11/30/2021 1133   K 3.5 11/30/2021 1133   CL 110 11/30/2021 1133   CO2 23 11/30/2021 1133   GLUCOSE 98 11/30/2021 1133   BUN 20 11/30/2021 1133   CREATININE 1.01 (H) 11/30/2021 1133   CREATININE 0.88 11/28/2021 1122   CALCIUM 9.0 11/30/2021 1133   PROT 6.4 (L) 11/30/2021 1133   ALBUMIN 3.7 11/30/2021 1133   AST 14 (L) 11/30/2021 1133   ALT 8 11/30/2021 1133   ALKPHOS 54 11/30/2021 1133   BILITOT 0.6 11/30/2021 1133   GFRNONAA 58 (L) 11/30/2021 1133   GFRNONAA 73 11/30/2020 1132   GFRAA 85 11/30/2020 1132    CBC    Component Value Date/Time   WBC 2.7 (L) 11/30/2021 1133   RBC 4.13 11/30/2021 1133   HGB 12.8 11/30/2021 1133   HCT 41.4 11/30/2021 1133   PLT 169 11/30/2021 1133   MCV 100.2 (H) 11/30/2021 1133   MCH 31.0 11/30/2021 1133   MCHC 30.9 11/30/2021 1133   RDW 13.5 11/30/2021 1133   LYMPHSABS 2,348 11/28/2021 1122   MONOABS 0.3 09/27/2009 2128   EOSABS 130 11/28/2021 1122   BASOSABS 41 11/28/2021 1122    Lab Results  Component Value Date   VD25OH 51 11/28/2021   T-score: DEXA 10/22/19 BMD 0.525 with T-score -2.9.   Assessment/Plan:   Patient tolerated injection without issue.   Administrations This Visit     denosumab (PROLIA) injection 60 mg     Admin Date 12/06/2021 Action Given Dose 60 mg Route Subcutaneous Administered By Cassandria Anger, [facility]           Patient due for updated DEXA. Order was placed at [facility]  on 11/28/21.   All questions encouraged and answered.  Instructed patient to call with any further questions or concerns.  Knox Saliva, PharmD, MPH, BCPS Clinical Pharmacist (Rheumatology and Pulmonology)

## 2021-12-07 LAB — DRUG MONITOR, PANEL 5, W/CONF, URINE
Amphetamines: NEGATIVE ng/mL (ref <500)
Barbiturates: NEGATIVE ng/mL (ref <300)
Benzodiazepines: NEGATIVE ng/mL (ref <100)
Cocaine Metabolite: NEGATIVE ng/mL (ref <150)
Creatinine: 227.9 mg/dL (ref >=20.0)
Marijuana Metabolite: NEGATIVE ng/mL (ref <20)
Methadone Metabolite: NEGATIVE ng/mL (ref <100)
Opiates: NEGATIVE ng/mL (ref <100)
Oxidant: NEGATIVE ug/mL (ref <200)
Oxycodone: NEGATIVE ng/mL (ref <100)
pH: 5.8 (ref 4.5–9.0)

## 2021-12-07 LAB — DRUG MONITOR, TRAMADOL,QN, URINE
Desmethyltramadol: 5793 ng/mL — ABNORMAL HIGH (ref <100)
Tramadol: >10000 ng/mL — ABNORMAL HIGH (ref <100)

## 2021-12-07 LAB — DM TEMPLATE

## 2021-12-08 NOTE — Progress Notes (Signed)
UDS is consistent with tramadol use.

## 2021-12-16 ENCOUNTER — Other Ambulatory Visit: Payer: Self-pay | Admitting: Rheumatology

## 2021-12-18 NOTE — Telephone Encounter (Signed)
Next Visit: 02/22/2022  Last Visit: 11/28/2021  UDS:12/06/2021 UDS is consistent with tramadol use.  Narc Agreement: 11/28/2021  Last Fill: 11/21/2021 tramadol 50 mg 2-4 tablets daily for pain management  Okay to refill Tramadol?

## 2021-12-27 ENCOUNTER — Telehealth: Payer: Self-pay | Admitting: *Deleted

## 2021-12-27 NOTE — Telephone Encounter (Signed)
Received DEXA results from [facility].  Date of Scan: 12/25/2021  Lowest T-score:-1.9  QAS:6.015  Lowest site measured:Right Femoral Neck  DX: Osteopenia  Significant changes in BMD and site measured (5% and above):16% change in BMD Right Femoral Neck, 13 % change in BMD Left Femoral Neck, 4% change in BMD WP Spine  Current Regimen:Prolia (last injection 12/06/2021), Vitamin D  Recommendation:Continue Current Treatment  Reviewed by:Dr. Bo Merino   Next Appointment:  02/15/2022  Patient advised of results and recommendations.

## 2022-01-15 LAB — COMPLETE METABOLIC PANEL WITH GFR
AG Ratio: 1.7 (calc) (ref 1.0–2.5)
ALT: 9 U/L (ref 6–29)
AST: 12 U/L (ref 10–35)
Albumin: 4.1 g/dL (ref 3.6–5.1)
Alkaline phosphatase (APISO): 48 U/L (ref 37–153)
BUN: 18 mg/dL (ref 7–25)
CO2: 31 mmol/L (ref 20–32)
Calcium: 9.1 mg/dL (ref 8.6–10.4)
Chloride: 110 mmol/L (ref 98–110)
Creat: 0.88 mg/dL (ref 0.60–1.00)
Globulin: 2.4 g/dL (calc) (ref 1.9–3.7)
Glucose, Bld: 84 mg/dL (ref 65–99)
Potassium: 4.3 mmol/L (ref 3.5–5.3)
Sodium: 143 mmol/L (ref 135–146)
Total Bilirubin: 0.4 mg/dL (ref 0.2–1.2)
Total Protein: 6.5 g/dL (ref 6.1–8.1)
eGFR: 69 mL/min/{1.73_m2} (ref >=60)

## 2022-01-15 LAB — CBC WITH DIFFERENTIAL/PLATELET
Absolute Monocytes: 437 cells/uL (ref 200–950)
Basophils Absolute: 41 cells/uL (ref 0–200)
Basophils Relative: 0.7 %
Eosinophils Absolute: 130 cells/uL (ref 15–500)
Eosinophils Relative: 2.2 %
HCT: 41.3 % (ref 35.0–45.0)
Hemoglobin: 13.3 g/dL (ref 11.7–15.5)
Lymphs Abs: 2348 cells/uL (ref 850–3900)
MCH: 31.4 pg (ref 27.0–33.0)
MCHC: 32.2 g/dL (ref 32.0–36.0)
MCV: 97.6 fL (ref 80.0–100.0)
MPV: 10.4 fL (ref 7.5–12.5)
Monocytes Relative: 7.4 %
Neutro Abs: 2944 cells/uL (ref 1500–7800)
Neutrophils Relative %: 49.9 %
Platelets: 236 10*3/uL (ref 140–400)
RBC: 4.23 10*6/uL (ref 3.80–5.10)
RDW: 12.3 % (ref 11.0–15.0)
Total Lymphocyte: 39.8 %
WBC: 5.9 10*3/uL (ref 3.8–10.8)

## 2022-01-15 LAB — VITAMIN D 25 HYDROXY (VIT D DEFICIENCY, FRACTURES): Vit D, 25-Hydroxy: 51 ng/mL (ref 30–100)

## 2022-01-17 ENCOUNTER — Encounter: Payer: Self-pay | Admitting: Rheumatology

## 2022-02-01 NOTE — Progress Notes (Signed)
? ?Office Visit Note ? ?Patient: Paula Murphy             ?Date of Birth: [birth year]           ?MRN: [id number]             ?PCP: Arlyss Repress, MD ?Referring: Arlyss Repress, MD ?Visit Date: 02/15/2022 ?Occupation: '@GUAROCC'$ @ ? ?Subjective:  ?Pain and stiffness in multiple joints and muscles ? ?History of Present Illness: Paula Murphy is a 76 y.o. female with history of osteoarthritis, fibromyalgia and osteoporosis.  She states she continues to have pain and stiffness in her bilateral hands, bilateral knees, bilateral feet and trochanteric bursa.  She also has generalized pain and discomfort from fibromyalgia.  She has off-and-on increased muscle spasms.  She states she has been going for physical therapy which has been helpful.  Her balance is improving.  She had her last Prolia injection on December 06, 2021.  She did not have any side effects from it.  She has been taking calcium and vitamin D.  Her bilateral total knee replacements are doing well.  She states she still have to take 1 to 2 tablets twice daily for pain management and she is trying to taper the dose of tramadol. ? ?Activities of Daily Living:  ?Patient reports morning stiffness for a few minutes.   ?Patient Denies nocturnal pain.  ?Difficulty dressing/grooming: Denies ?Difficulty climbing stairs: Reports ?Difficulty getting out of chair: Denies ?Difficulty using hands for taps, buttons, cutlery, and/or writing: Reports ? ?Review of Systems  ?Constitutional:  Positive for fatigue.  ?HENT:  Positive for mouth dryness. Negative for mouth sores and nose dryness.   ?Eyes:  Positive for pain, itching and dryness.  ?Respiratory:  Negative for shortness of breath and difficulty breathing.   ?Cardiovascular:  Negative for chest pain and palpitations.  ?Gastrointestinal:  Negative for blood in stool, constipation and diarrhea.  ?Endocrine: Negative for increased urination.  ?Genitourinary:  Negative for difficulty urinating.  ?Musculoskeletal:  Positive  for myalgias, morning stiffness, muscle tenderness and myalgias. Negative for joint pain, joint pain and joint swelling.  ?Skin:  Negative for color change, rash and redness.  ?Allergic/Immunologic: Negative for susceptible to infections.  ?Neurological:  Negative for dizziness, numbness, headaches, memory loss and weakness.  ?Hematological:  Positive for bruising/bleeding tendency.  ?Psychiatric/Behavioral:  Negative for confusion.   ? ?PMFS History:  ?Patient Active Problem List  ? Diagnosis Date Noted  ? Primary osteoarthritis of left knee 10/10/2020  ? Ingrown toenail 02/02/2019  ? Primary osteoarthritis of both feet 03/21/2017  ? History of macular degeneration 03/21/2017  ? Other fatigue 03/21/2017  ? Age-related osteoporosis without current pathological fracture 03/21/2017  ? Esophageal reflux 06/29/2013  ? Actinic keratosis 04/11/2013  ? OA (osteoarthritis) of knee 03/02/2013  ? DEPRESSION 07/24/2010  ? Major depressive disorder, single episode, unspecified 07/24/2010  ? UTI 07/04/2010  ? ABDOMINAL BLOATING 01/20/2010  ? Fibromyalgia 10/04/2009  ? INSOMNIA, CHRONIC 10/04/2009  ? Autoimmune disease ([place]) 10/04/2009  ? Vitamin D deficiency 10/26/2008  ? HYPOTHYROIDISM 08/13/2008  ? HYPERLIPIDEMIA 08/13/2008  ? HYPERTENSION 08/13/2008  ? HEADACHE 08/13/2008  ? Muscle pain 08/12/2008  ?  ?Past Medical History:  ?Diagnosis Date  ? Arthritis   ? OA AND PAIN RT KNEE  ? Autoimmune disease ([place])   ? + ANA, and DS DNA--PT STATES SHE WAS TOLD SHE DOES NOT HAVE LUPUS AS PREVIOUSLY THOUGHT  ? Fibromyalgia   ? followed by Dr. Estanislado Pandy  ? Hemorrhoids   ?  Hyperlipidemia   ? Hypothyroidism   ? Kidney stone   ? Macular degeneration   ? BOTH EYES  ? PONV (postoperative nausea and vomiting)   ? Thyroid disease   ? Hypothyroidism  ?  ?Family History  ?Problem Relation Age of Onset  ? Stroke Mother   ? Hypertension Mother   ? Hyperlipidemia Mother   ? Cancer Mother   ?     Lung  ? Alzheimer's disease Mother   ? Stroke Father   ?  Hypertension Father   ? Diabetes Father   ?     Type II  ? Hyperlipidemia Father   ? Cancer Father   ?     Lung  ? Arthritis Father   ?     Rheumatoid  ? Thyroid disease Daughter   ? Breast cancer Cousin   ? Breast cancer Cousin   ? ?Past Surgical History:  ?Procedure Laterality Date  ? ABDOMINAL HYSTERECTOMY  1989  ? BUNIONECTOMY Left 2012  ? CHOLECYSTECTOMY  1996  ? EYE SURGERY    ? BILATERAL CATARACT EXTRACTION  ? KNEE ARTHROSCOPY  1994  ? Right Knee  ? KNEE SURGERY    ? NECK SURGERY  2003  ? TONSILLECTOMY  1953  ? TOTAL KNEE ARTHROPLASTY Right 03/02/2013  ? Procedure: RIGHT TOTAL KNEE ARTHROPLASTY;  Surgeon: Gearlean Alf, MD;  Location: [facility];  Service: Orthopedics;  Laterality: Right;  ? TOTAL KNEE ARTHROPLASTY Left 10/10/2020  ? Procedure: TOTAL KNEE ARTHROPLASTY;  Surgeon: Gaynelle Arabian, MD;  Location: [facility];  Service: Orthopedics;  Laterality: Left;  62mn  ? ?Social History  ? ?Social History Narrative  ? Retired  ? Married  ? 1 daughter/1 grandson  ? Never Smoked  ? Alcohol use - yes (social)  ? ?Immunization History  ?Administered Date(s) Administered  ? Influenza Whole 08/12/2008, 08/30/2009, 08/23/2010  ? PFIZER(Purple Top)SARS-COV-2 Vaccination 11/26/2019, 12/17/2019  ? Pneumococcal Polysaccharide-23 03/29/2009  ? Td 07/22/2007  ?  ? ?Objective: ?Vital Signs: BP 119/78 (BP Location: Left Arm, Patient Position: Sitting, Cuff Size: Large)   Pulse 78   Ht '5\' 1"'$  (1.549 m)   Wt 161 lb 12.8 oz (73.4 kg)   BMI 30.57 kg/m?   ? ?Physical Exam ?Vitals and nursing note reviewed.  ?Constitutional:   ?   Appearance: She is well-developed.  ?HENT:  ?   Head: Normocephalic and atraumatic.  ?Eyes:  ?   Conjunctiva/sclera: Conjunctivae normal.  ?Cardiovascular:  ?   Rate and Rhythm: Normal rate and regular rhythm.  ?   Heart sounds: Normal heart sounds.  ?Pulmonary:  ?   Effort: Pulmonary effort is normal.  ?   Breath sounds: Normal breath sounds.  ?Abdominal:  ?   General: Bowel sounds are normal.  ?    Palpations: Abdomen is soft.  ?Musculoskeletal:  ?   Cervical back: Normal range of motion.  ?Lymphadenopathy:  ?   Cervical: No cervical adenopathy.  ?Skin: ?   General: Skin is warm and dry.  ?   Capillary Refill: Capillary refill takes less than 2 seconds.  ?Neurological:  ?   Mental Status: She is alert and oriented to person, place, and time.  ?Psychiatric:     ?   Behavior: Behavior normal.  ?  ? ?Musculoskeletal Exam: She had good range of motion of her cervical spine.  She had limited painful range of motion of her lumbar spine.  Shoulder joints, elbow joints, wrist joints, MCPs PIPs and DIPs with good  range of motion with no synovitis.  She had good range of motion of bilateral hip joints with tenderness over trochanteric bursa.  Bilateral hip joints were replaced without any tenderness or warmth.  There was no tenderness over ankles or MTPs.  She walks slowly without any assistance. ? ?CDAI Exam: ?CDAI Score: -- ?Patient Global: --; Provider Global: -- ?Swollen: --; Tender: -- ?Joint Exam 02/15/2022  ? ?No joint exam has been documented for this visit  ? ?There is currently no information documented on the homunculus. Go to the Rheumatology activity and complete the homunculus joint exam. ? ?Investigation: ?No additional findings. ? ?Imaging: ?No results found. ? ?Recent Labs: ?Lab Results  ?Component Value Date  ? WBC 2.7 (L) 11/30/2021  ? HGB 12.8 11/30/2021  ? PLT 169 11/30/2021  ? NA 144 11/30/2021  ? K 3.5 11/30/2021  ? CL 110 11/30/2021  ? CO2 23 11/30/2021  ? GLUCOSE 98 11/30/2021  ? BUN 20 11/30/2021  ? CREATININE 1.01 (H) 11/30/2021  ? BILITOT 0.6 11/30/2021  ? ALKPHOS 54 11/30/2021  ? AST 14 (L) 11/30/2021  ? ALT 8 11/30/2021  ? PROT 6.4 (L) 11/30/2021  ? ALBUMIN 3.7 11/30/2021  ? CALCIUM 9.0 11/30/2021  ? GFRAA 85 11/30/2020  ? ? ? ? ? ?Speciality Comments: Prolia: 02/07/18, 08/19/18, 10/12/19, 04/11/20, 12/06/20, 06/04/21,12/06/2021 ? ?Procedures:  ?No procedures performed ?Allergies: Codeine and  Hydrocodone  ? ?Assessment / Plan:     ?Visit Diagnoses: Age-related osteoporosis without current pathological fracture -she received her last Prolia injection on December 06, 2021.  She has been tolerating Prolia

## 2022-02-15 ENCOUNTER — Encounter: Payer: Self-pay | Admitting: Rheumatology

## 2022-02-15 ENCOUNTER — Ambulatory Visit: Payer: Medicare HMO | Admitting: Rheumatology

## 2022-02-15 VITALS — BP 119/78 | HR 78 | Ht 61.0 in | Wt 161.8 lb

## 2022-02-15 DIAGNOSIS — I890 Lymphedema, not elsewhere classified: Secondary | ICD-10-CM

## 2022-02-15 DIAGNOSIS — M19072 Primary osteoarthritis, left ankle and foot: Secondary | ICD-10-CM

## 2022-02-15 DIAGNOSIS — Z8669 Personal history of other diseases of the nervous system and sense organs: Secondary | ICD-10-CM

## 2022-02-15 DIAGNOSIS — M7062 Trochanteric bursitis, left hip: Secondary | ICD-10-CM

## 2022-02-15 DIAGNOSIS — Z8659 Personal history of other mental and behavioral disorders: Secondary | ICD-10-CM

## 2022-02-15 DIAGNOSIS — Z96653 Presence of artificial knee joint, bilateral: Secondary | ICD-10-CM | POA: Diagnosis not present

## 2022-02-15 DIAGNOSIS — E559 Vitamin D deficiency, unspecified: Secondary | ICD-10-CM | POA: Diagnosis not present

## 2022-02-15 DIAGNOSIS — M797 Fibromyalgia: Secondary | ICD-10-CM

## 2022-02-15 DIAGNOSIS — M810 Age-related osteoporosis without current pathological fracture: Secondary | ICD-10-CM

## 2022-02-15 DIAGNOSIS — Z8639 Personal history of other endocrine, nutritional and metabolic disease: Secondary | ICD-10-CM

## 2022-02-15 DIAGNOSIS — F5101 Primary insomnia: Secondary | ICD-10-CM

## 2022-02-15 DIAGNOSIS — Z5181 Encounter for therapeutic drug level monitoring: Secondary | ICD-10-CM

## 2022-02-15 DIAGNOSIS — Z8679 Personal history of other diseases of the circulatory system: Secondary | ICD-10-CM

## 2022-02-15 DIAGNOSIS — R5383 Other fatigue: Secondary | ICD-10-CM

## 2022-02-15 DIAGNOSIS — M19041 Primary osteoarthritis, right hand: Secondary | ICD-10-CM

## 2022-02-15 DIAGNOSIS — M19071 Primary osteoarthritis, right ankle and foot: Secondary | ICD-10-CM

## 2022-02-15 DIAGNOSIS — M19042 Primary osteoarthritis, left hand: Secondary | ICD-10-CM

## 2022-02-15 DIAGNOSIS — R768 Other specified abnormal immunological findings in serum: Secondary | ICD-10-CM

## 2022-02-15 DIAGNOSIS — G8929 Other chronic pain: Secondary | ICD-10-CM

## 2022-02-22 ENCOUNTER — Ambulatory Visit: Payer: Medicare HMO | Admitting: Rheumatology

## 2022-03-16 ENCOUNTER — Other Ambulatory Visit: Payer: Self-pay | Admitting: Rheumatology

## 2022-03-19 ENCOUNTER — Other Ambulatory Visit: Payer: Self-pay | Admitting: *Deleted

## 2022-03-19 MED ORDER — TOPIRAMATE 50 MG PO TABS: 150.0000 mg | ORAL_TABLET | Freq: Every day | ORAL | 2 refills | Status: DC

## 2022-03-19 NOTE — Telephone Encounter (Signed)
Patient states she needs a refill on Topiramate. Patient states she is taking 3 tablets at night.  ? ?Next Visit: 08/16/2022 ? ?Last Visit: 02/15/2022 ? ?Last Fill: 10/13/2021 ? ?Dx: Fibromyalgia ? ?Current Dose per office note on 02/15/2022: not discussed  ? ?Okay to refill Topiramate?   ?

## 2022-05-04 ENCOUNTER — Other Ambulatory Visit (HOSPITAL_COMMUNITY): Payer: Self-pay

## 2022-05-10 ENCOUNTER — Other Ambulatory Visit (HOSPITAL_COMMUNITY): Payer: Self-pay

## 2022-05-10 ENCOUNTER — Telehealth: Payer: Self-pay | Admitting: Rheumatology

## 2022-05-10 DIAGNOSIS — E559 Vitamin D deficiency, unspecified: Secondary | ICD-10-CM

## 2022-05-10 DIAGNOSIS — M810 Age-related osteoporosis without current pathological fracture: Secondary | ICD-10-CM

## 2022-05-10 DIAGNOSIS — Z5181 Encounter for therapeutic drug level monitoring: Secondary | ICD-10-CM

## 2022-05-10 NOTE — Telephone Encounter (Signed)
Patient called the office stating she had questions regarding her labs for Prolia. Patient requests a call back.

## 2022-05-10 NOTE — Telephone Encounter (Signed)
There are no Prolia grants open at this time. None have opened since early last year. I cannot anticipate if any grants will open in the next few weeks.  Patient is due for Prolia on or after 06/04/22. She received in clinic last time. Copay through pharmacy benefit is $95.  Knox Saliva, PharmD, MPH, BCPS, CPP Clinical Pharmacist (Rheumatology and Pulmonology)

## 2022-05-10 NOTE — Telephone Encounter (Signed)
Spoke with patient and she inquired about lab work due for [employer]. Patient advised she would be due for lab work prior to [employer]. Patient advised to come to the office around May 29, 2022 to have her lab work completed. Patient expressed understanding.   Patient would like to know if she will qualify for the grant for Prolia this year?

## 2022-05-15 ENCOUNTER — Other Ambulatory Visit (HOSPITAL_COMMUNITY): Payer: Self-pay

## 2022-05-23 ENCOUNTER — Other Ambulatory Visit: Payer: Self-pay | Admitting: Endocrinology

## 2022-05-23 DIAGNOSIS — Z1231 Encounter for screening mammogram for malignant neoplasm of breast: Secondary | ICD-10-CM

## 2022-05-24 ENCOUNTER — Telehealth: Payer: Self-pay | Admitting: Pharmacist

## 2022-05-24 ENCOUNTER — Other Ambulatory Visit (HOSPITAL_COMMUNITY): Payer: Self-pay

## 2022-05-24 DIAGNOSIS — M810 Age-related osteoporosis without current pathological fracture: Secondary | ICD-10-CM

## 2022-05-24 MED ORDER — PROLIA 60 MG/ML ~~LOC~~ SOSY
60.0000 mg | PREFILLED_SYRINGE | SUBCUTANEOUS | 0 refills | Status: DC
  Filled 2022-05-24 (×2): qty 1, 180d supply, fill #0

## 2022-05-24 NOTE — Telephone Encounter (Signed)
Patient due for Prolia on 06/04/22.  I spoke with patient and advised that there have been no grants that have opened for osteoporosis. Also advised that copay is $95. She was provided with [facility] phone number to call and provide CC information.  Rx sent to [facility] to be couriered to clinic by this date.   Patient states she will come for labs on 05/29/22  Knox Saliva, PharmD, MPH, BCPS, CPP Clinical Pharmacist (Rheumatology and Pulmonology)

## 2022-05-29 ENCOUNTER — Other Ambulatory Visit: Payer: Self-pay | Admitting: *Deleted

## 2022-05-29 DIAGNOSIS — R768 Other specified abnormal immunological findings in serum: Secondary | ICD-10-CM

## 2022-05-29 DIAGNOSIS — Z5181 Encounter for therapeutic drug level monitoring: Secondary | ICD-10-CM

## 2022-05-29 DIAGNOSIS — M810 Age-related osteoporosis without current pathological fracture: Secondary | ICD-10-CM

## 2022-05-29 DIAGNOSIS — E559 Vitamin D deficiency, unspecified: Secondary | ICD-10-CM

## 2022-05-30 ENCOUNTER — Other Ambulatory Visit (HOSPITAL_COMMUNITY): Payer: Self-pay

## 2022-05-30 NOTE — Telephone Encounter (Signed)
CBC, CMP, and Vitamin D labs from 05/29/22 wnl. Patient scheduled for Prolia appt on 06/05/22  Knox Saliva, PharmD, MPH, BCPS, CPP Clinical Pharmacist (Rheumatology and Pulmonology)

## 2022-05-30 NOTE — Progress Notes (Signed)
UA shows white cells and many bacteria.  If patient has symptoms of UTI then she should contact her PCP for treatment.  CMP normal, vitamin D normal, CBC normal, sed rate normal, complements normal, ANA and double-stranded DNA pending.

## 2022-05-30 NOTE — Telephone Encounter (Signed)
Prolia received from [facility]. Placed in refrigerator  Tziporah Knoke, PharmD, MPH, BCPS, CPP Clinical Pharmacist (Rheumatology and Pulmonology) 

## 2022-05-30 NOTE — Telephone Encounter (Signed)
CBC, CMP, and Vitamin D labs from 05/29/22 wnl. Rx for Prolia already sent to [facility].  Knox Saliva, PharmD, MPH, BCPS, CPP Clinical Pharmacist (Rheumatology and Pulmonology)

## 2022-05-31 ENCOUNTER — Telehealth: Payer: Self-pay | Admitting: Rheumatology

## 2022-05-31 NOTE — Telephone Encounter (Signed)
Tisha from Dr. Lorain Childes office called requesting labwork and office notes from patient's last appointment faxed to [phone].  She states patient called their office stating she had Urinalysis which showed UTI and they need results in order to treat it.

## 2022-05-31 NOTE — Telephone Encounter (Signed)
Faxed last labs to Dr. Baldwin Crown office. Last office note not faxed as it does not pertain to the most recent labs.

## 2022-06-01 ENCOUNTER — Other Ambulatory Visit: Payer: Self-pay | Admitting: Rheumatology

## 2022-06-01 LAB — CBC WITH DIFFERENTIAL/PLATELET
Absolute Monocytes: 462 cells/uL (ref 200–950)
Basophils Absolute: 41 cells/uL (ref 0–200)
Basophils Relative: 0.6 %
Eosinophils Absolute: 200 cells/uL (ref 15–500)
Eosinophils Relative: 2.9 %
HCT: 39.6 % (ref 35.0–45.0)
Hemoglobin: 13.1 g/dL (ref 11.7–15.5)
Lymphs Abs: 2408 cells/uL (ref 850–3900)
MCH: 31.6 pg (ref 27.0–33.0)
MCHC: 33.1 g/dL (ref 32.0–36.0)
MCV: 95.4 fL (ref 80.0–100.0)
MPV: 10.3 fL (ref 7.5–12.5)
Monocytes Relative: 6.7 %
Neutro Abs: 3788 cells/uL (ref 1500–7800)
Neutrophils Relative %: 54.9 %
Platelets: 256 10*3/uL (ref 140–400)
RBC: 4.15 10*6/uL (ref 3.80–5.10)
RDW: 12.6 % (ref 11.0–15.0)
Total Lymphocyte: 34.9 %
WBC: 6.9 10*3/uL (ref 3.8–10.8)

## 2022-06-01 LAB — COMPLETE METABOLIC PANEL WITH GFR
AG Ratio: 1.3 (calc) (ref 1.0–2.5)
ALT: 10 U/L (ref 6–29)
AST: 12 U/L (ref 10–35)
Albumin: 3.6 g/dL (ref 3.6–5.1)
Alkaline phosphatase (APISO): 64 U/L (ref 37–153)
BUN: 17 mg/dL (ref 7–25)
CO2: 27 mmol/L (ref 20–32)
Calcium: 8.9 mg/dL (ref 8.6–10.4)
Chloride: 107 mmol/L (ref 98–110)
Creat: 0.89 mg/dL (ref 0.60–1.00)
Globulin: 2.7 g/dL (calc) (ref 1.9–3.7)
Glucose, Bld: 108 mg/dL — ABNORMAL HIGH (ref 65–99)
Potassium: 4.3 mmol/L (ref 3.5–5.3)
Sodium: 140 mmol/L (ref 135–146)
Total Bilirubin: 0.3 mg/dL (ref 0.2–1.2)
Total Protein: 6.3 g/dL (ref 6.1–8.1)
eGFR: 68 mL/min/{1.73_m2} (ref >=60)

## 2022-06-01 LAB — URINALYSIS, ROUTINE W REFLEX MICROSCOPIC
Bilirubin Urine: NEGATIVE
Glucose, UA: NEGATIVE
Hgb urine dipstick: NEGATIVE
Hyaline Cast: NONE SEEN /LPF
Ketones, ur: NEGATIVE
Nitrite: NEGATIVE
Protein, ur: NEGATIVE
RBC / HPF: NONE SEEN /HPF (ref 0–2)
Specific Gravity, Urine: 1.02 (ref 1.001–1.035)
WBC, UA: >=60 /HPF — AB (ref 0–5)
pH: 6 (ref 5.0–8.0)

## 2022-06-01 LAB — MICROSCOPIC MESSAGE

## 2022-06-01 LAB — C3 AND C4
C3 Complement: 137 mg/dL (ref 83–193)
C4 Complement: 29 mg/dL (ref 15–57)

## 2022-06-01 LAB — ANTI-DNA ANTIBODY, DOUBLE-STRANDED: ds DNA Ab: 71 IU/mL — ABNORMAL HIGH

## 2022-06-01 LAB — ANTI-NUCLEAR AB-TITER (ANA TITER): ANA Titer 1: 1:80 {titer} — ABNORMAL HIGH

## 2022-06-01 LAB — ANA: Anti Nuclear Antibody (ANA): POSITIVE — AB

## 2022-06-01 LAB — SEDIMENTATION RATE: Sed Rate: 9 mm/h (ref 0–30)

## 2022-06-01 LAB — VITAMIN D 25 HYDROXY (VIT D DEFICIENCY, FRACTURES): Vit D, 25-Hydroxy: 46 ng/mL (ref 30–100)

## 2022-06-01 NOTE — Telephone Encounter (Signed)
Next Visit: 08/16/2022   Last Visit: 02/15/2022   Last Fill: 03/19/2022   Dx: Fibromyalgia   Current Dose per office note on 02/15/2022: not discussed    Okay to refill Topiramate?

## 2022-06-01 NOTE — Progress Notes (Signed)
Double-stranded DNA is positive and lower titer ANA is positive and stable.  No change in treatment advised.

## 2022-06-04 NOTE — Progress Notes (Deleted)
Pharmacy Note  Subjective:   Patient presents to clinic today to receive bi-annual dose of Prolia. Her last dose of Prolia was on 12/06/21  Patient running a fever or have signs/symptoms of infection? {yes/no:20286}  Patient currently on antibiotics for the treatment of infection? {yes/no:20286}  Patient had fall in the last 6 months?  {yes/no:20286}  If yes, did it require medical attention? {yes/no:20286}   Patient taking calcium 1200 mg daily through diet or supplement and at least 800 units vitamin D? {yes/no:20286}  Objective: CMP     Component Value Date/Time   NA 140 05/29/2022 1340   K 4.3 05/29/2022 1340   CL 107 05/29/2022 1340   CO2 27 05/29/2022 1340   GLUCOSE 108 (H) 05/29/2022 1340   BUN 17 05/29/2022 1340   CREATININE 0.89 05/29/2022 1340   CALCIUM 8.9 05/29/2022 1340   PROT 6.3 05/29/2022 1340   ALBUMIN 3.7 11/30/2021 1133   AST 12 05/29/2022 1340   ALT 10 05/29/2022 1340   ALKPHOS 54 11/30/2021 1133   BILITOT 0.3 05/29/2022 1340   GFRNONAA 58 (L) 11/30/2021 1133   GFRNONAA 73 11/30/2020 1132   GFRAA 85 11/30/2020 1132    CBC    Component Value Date/Time   WBC 6.9 05/29/2022 1340   RBC 4.15 05/29/2022 1340   HGB 13.1 05/29/2022 1340   HCT 39.6 05/29/2022 1340   PLT 256 05/29/2022 1340   MCV 95.4 05/29/2022 1340   MCH 31.6 05/29/2022 1340   MCHC 33.1 05/29/2022 1340   RDW 12.6 05/29/2022 1340   LYMPHSABS 2,408 05/29/2022 1340   MONOABS 0.3 09/27/2009 2128   EOSABS 200 05/29/2022 1340   BASOSABS 41 05/29/2022 1340    Lab Results  Component Value Date   VD25OH 46 05/29/2022    T-score: 12/25/21 -1.9 at right femoral neck BMD 0.642. Significant changes in BMD and site measured (5% and above):16% change in BMD Right Femoral Neck, 13 % change in BMD Left Femoral Neck, 4% change in BMD WP Spine  Assessment/Plan:   Patient tolerated injection  ***.   Next DEXA scan is due February 2025. Her next Prolia is due 12/02/22.   All questions encouraged  and answered.  Instructed patient to call with any further questions or concerns.   Knox Saliva, PharmD, MPH, BCPS, CPP Clinical Pharmacist (Rheumatology and Pulmonology)

## 2022-06-05 ENCOUNTER — Ambulatory Visit: Payer: Medicare HMO | Admitting: Pharmacist

## 2022-06-05 NOTE — Telephone Encounter (Signed)
Patient came for Prolia appt today at 06/05/22 and states she is taking antibiotics for ongoing UTI. She will be completing her last dose on morning of 06/07/22. She states she is not following up with PCP once completed.  Because I will be [place] on Friday and patient is going out of town on Sunday, she will come to clinic on 06/08/22 at 10am to have Prolia administered by Wayne Sever, LPN  Knox Saliva, PharmD, MPH, BCPS, CPP Clinical Pharmacist (Rheumatology and Pulmonology)

## 2022-06-06 ENCOUNTER — Other Ambulatory Visit: Payer: Self-pay | Admitting: Rheumatology

## 2022-06-06 MED ORDER — TRAMADOL HCL 50 MG PO TABS: ORAL_TABLET | ORAL | 0 refills | Status: DC

## 2022-06-06 NOTE — Telephone Encounter (Signed)
Next Visit: 08/16/2022   Last Visit: 02/15/2022   Last Fill: 12/18/2021 tramadol 50 mg 2-4 tablets daily for pain management.  UDS:12/06/2021  Narc Agreement: 12/14/2021  Okay to refill Tramadol?

## 2022-06-06 NOTE — Telephone Encounter (Signed)
Patient left a voicemail requesting prescription refill of Tramadol 50 mg, 120 tablets for 30 day supply.

## 2022-06-08 ENCOUNTER — Ambulatory Visit: Payer: Medicare HMO

## 2022-06-08 ENCOUNTER — Ambulatory Visit: Payer: Medicare HMO | Attending: Rheumatology | Admitting: *Deleted

## 2022-06-08 VITALS — BP 128/79

## 2022-06-08 DIAGNOSIS — M810 Age-related osteoporosis without current pathological fracture: Secondary | ICD-10-CM | POA: Diagnosis not present

## 2022-06-08 MED ORDER — DENOSUMAB 60 MG/ML ~~LOC~~ SOSY
60.0000 mg | PREFILLED_SYRINGE | Freq: Once | SUBCUTANEOUS | Status: AC
  Administered 2022-06-08: 60 mg via SUBCUTANEOUS

## 2022-06-08 NOTE — Progress Notes (Signed)
Subjective:   Patient presents to clinic today to receive bi-annual dose of Prolia.  Patient running a fever or have signs/symptoms of infection? No  Patient currently on antibiotics for the treatment of infection? No  Patient had fall in the last 6 months?  No  If yes, did it require medical attention? No   Patient taking calcium 1200 mg daily through diet or supplement and at least 800 units vitamin D? Yes  Objective: CMP     Component Value Date/Time   NA 140 05/29/2022 1340   K 4.3 05/29/2022 1340   CL 107 05/29/2022 1340   CO2 27 05/29/2022 1340   GLUCOSE 108 (H) 05/29/2022 1340   BUN 17 05/29/2022 1340   CREATININE 0.89 05/29/2022 1340   CALCIUM 8.9 05/29/2022 1340   PROT 6.3 05/29/2022 1340   ALBUMIN 3.7 11/30/2021 1133   AST 12 05/29/2022 1340   ALT 10 05/29/2022 1340   ALKPHOS 54 11/30/2021 1133   BILITOT 0.3 05/29/2022 1340   GFRNONAA 58 (L) 11/30/2021 1133   GFRNONAA 73 11/30/2020 1132   GFRAA 85 11/30/2020 1132    CBC    Component Value Date/Time   WBC 6.9 05/29/2022 1340   RBC 4.15 05/29/2022 1340   HGB 13.1 05/29/2022 1340   HCT 39.6 05/29/2022 1340   PLT 256 05/29/2022 1340   MCV 95.4 05/29/2022 1340   MCH 31.6 05/29/2022 1340   MCHC 33.1 05/29/2022 1340   RDW 12.6 05/29/2022 1340   LYMPHSABS 2,408 05/29/2022 1340   MONOABS 0.3 09/27/2009 2128   EOSABS 200 05/29/2022 1340   BASOSABS 41 05/29/2022 1340    Lab Results  Component Value Date   VD25OH 46 05/29/2022    T-score: -1.9 01/02/2022  Assessment/Plan:   Administrations This Visit     denosumab (PROLIA) injection 60 mg     Admin Date 06/08/2022 Action Given Dose 60 mg Route Subcutaneous Administered By Carole Binning, LPN             Patient tolerated injection well.   Patient is to return in 10-14 days for labs to monitor for hypocalcemia.  Future orders placed.   All questions encouraged and answered.  Instructed patient to call with any further questions or  concerns.

## 2022-06-22 ENCOUNTER — Ambulatory Visit
Admission: RE | Admit: 2022-06-22 | Discharge: 2022-06-22 | Disposition: A | Discharge status: Home / Self Care | Payer: Medicare HMO | Source: Ambulatory Visit | Attending: Endocrinology | Admitting: Endocrinology

## 2022-06-22 DIAGNOSIS — Z1231 Encounter for screening mammogram for malignant neoplasm of breast: Secondary | ICD-10-CM

## 2022-07-11 NOTE — Progress Notes (Signed)
Synopsis: Referred for dyspnea by Reynold Bowen, MD  Subjective:   PATIENT ID: Paula Murphy: female DOB: 1945/11/20, MRN: [id number]  Chief Complaint  Patient presents with   Pulmonary Consult    Referred by Dr. Reynold Bowen. Pt c/o SOB for the past 2.5 years- she gets winded sometimes just walking approx 50 ft. She has some SOB when she first lies down at night. She has occ dry cough.    76yF with history of HTN, GERD, hypothyroid, OP, OA, fibromyalgia referred for dyspnea  After second covid shot had trouble walking without dragging left leg. Then has had dyspnea, 'couldn't hold her urine.' Still not walking right. She has some trouble breathing initially when she lies down but then she coughs and then it's ok. No worsening swelling in her legs. No change in weight. She occasional dry cough but it is not bothersome. No CP. No fever.   Overall she says she is not really bothered by her dyspnea really the only thing that bothers her is her LLE weakness and gait impairment.  Never has had covid-19 infection.  Otherwise pertinent review of systems is negative.  She has no family history of lung disease except for brother with COPD, mom/dad with COPD  She is a never smoker. She worked for [employer]. 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 No MJ, vaping. No pets.     Past Medical History:  Diagnosis Date   Arthritis    OA AND PAIN RT KNEE   Autoimmune disease (HCC)    + ANA, and DS DNA--PT STATES SHE WAS TOLD SHE DOES NOT HAVE LUPUS AS PREVIOUSLY THOUGHT   Fibromyalgia    followed by Dr. Estanislado Pandy   Hemorrhoids    Hyperlipidemia    Hypothyroidism    Kidney stone    Macular degeneration    BOTH EYES   PONV (postoperative nausea and vomiting)    Thyroid disease    Hypothyroidism     Family History  Problem Relation Age of Onset   Stroke Mother    Hypertension Mother    Hyperlipidemia Mother    Cancer Mother        Lung   Alzheimer's disease  Mother    COPD Mother    Stroke Father    Hypertension Father    Diabetes Father        Type II   Hyperlipidemia Father    Cancer Father        Lung   Arthritis Father        Rheumatoid   COPD Father    COPD Brother    Thyroid disease Daughter    Breast cancer Cousin    Breast cancer Cousin      Past Surgical History:  Procedure Laterality Date   ABDOMINAL HYSTERECTOMY  1989   BUNIONECTOMY Left 2012   CHOLECYSTECTOMY  1996   EYE SURGERY     BILATERAL CATARACT EXTRACTION   KNEE ARTHROSCOPY  1994   Right Knee   KNEE SURGERY     NECK SURGERY  2003   TONSILLECTOMY  1953   TOTAL KNEE ARTHROPLASTY Right 03/02/2013   Procedure: RIGHT TOTAL KNEE ARTHROPLASTY;  Surgeon: Gearlean Alf, MD;  Location: [facility];  Service: Orthopedics;  Laterality: Right;   TOTAL KNEE ARTHROPLASTY Left 10/10/2020   Procedure: TOTAL KNEE ARTHROPLASTY;  Surgeon: Gaynelle Arabian, MD;  Location: [facility];  Service: Orthopedics;  Laterality: Left;  26mn    Social History   Socioeconomic  History   Marital status: Divorced    Spouse name: Tommy   Number of children: 1   Years of education: Not on file   Highest education level: Not on file  Occupational History   Occupation: Retired    Fish farm manager: RETIRED  Tobacco Use   Smoking status: Never    Passive exposure: Past   Smokeless tobacco: Never  Vaping Use   Vaping Use: Never used  Substance and Sexual Activity   Alcohol use: Not Currently   Drug use: No   Sexual activity: Not on file  Other Topics Concern   Not on file  Social History Narrative   Retired   Married   1 daughter/1 grandson   Never Smoked   Alcohol use - yes (social)   Social Determinants of Radio broadcast assistant Strain: Not on file  Food Insecurity: Not on file  Transportation Needs: Not on file  Physical Activity: Not on file  Stress: Not on file  Social Connections: Not on file  Intimate Partner Violence: Not on file     Allergies  Allergen Reactions    Codeine Nausea And Vomiting    NAUSEA & VOMITING   Hydrocodone Nausea And Vomiting     Outpatient Medications Prior to Visit  Medication Sig Dispense Refill   Bisacodyl (LAXATIVE PO) Take 2 tablets by mouth daily. Vegetable     Cholecalciferol (VITAMIN D3) 5000 units CAPS Take 5,000 Units by mouth daily.      denosumab (PROLIA) 60 MG/ML SOSY injection Inject 60 mg into the skin every 6 (six) months. Courier to rheum: [address] [place], [place] [address]. Appt on 06/04/22 1 mL 0   escitalopram (LEXAPRO) 10 MG tablet Take 10 mg by mouth daily.     ezetimibe (ZETIA) 10 MG tablet Take 10 mg by mouth daily.      fluticasone (FLONASE) 50 MCG/ACT nasal spray Place 1 spray into both nostrils daily as needed for allergies.      levothyroxine (SYNTHROID, LEVOTHROID) 125 MCG tablet Take 125 mcg by mouth daily before breakfast.     loperamide (IMODIUM) 2 MG capsule Take 1 capsule (2 mg total) by mouth 4 (four) times daily as needed for diarrhea or loose stools. 12 capsule 0   Multiple Vitamins-Minerals (PRESERVISION AREDS) CAPS Take 1 capsule by mouth daily.     ondansetron (ZOFRAN-ODT) 4 MG disintegrating tablet Take 1 tablet (4 mg total) by mouth every 8 (eight) hours as needed. 20 tablet 0   oxybutynin (DITROPAN-XL) 10 MG 24 hr tablet Take 10 mg by mouth at bedtime.     pantoprazole (PROTONIX) 40 MG tablet Take 40 mg by mouth daily.     simethicone (MYLICON) 759 MG chewable tablet 125 mg daily as needed for flatulence.      topiramate (TOPAMAX) 50 MG tablet TAKE 3 TABLETS EVERY DAY 270 tablet 0   traMADol (ULTRAM) 50 MG tablet TAKE 1 TO 2 TABLETS TWICE DAILY AS NEEDED 120 tablet 0   valACYclovir (VALTREX) 500 MG tablet Take 500 mg by mouth daily as needed (fever blister).      vitamin B-12 (CYANOCOBALAMIN) 500 MCG tablet Take 500 mcg by mouth daily.     amoxicillin (AMOXIL) 500 MG capsule Take 2,000 mg by mouth once. (Patient not taking: Reported on 07/12/2022)     levothyroxine (SYNTHROID)  150 MCG tablet Take 150 mcg by mouth daily before breakfast. (Patient not taking: Reported on 02/15/2022)     tamsulosin (FLOMAX) 0.4  MG CAPS capsule Take 1 capsule (0.4 mg total) by mouth daily. 30 capsule 0   No facility-administered medications prior to visit.       Objective:   Physical Exam:  General appearance: 76 y.o., female, NAD, conversant  Eyes: anicteric sclerae; PERRL, tracking appropriately HENT: NCAT; MMM Neck: Trachea midline; no lymphadenopathy, no JVD Lungs: CTAB, no crackles, no wheeze, with normal respiratory effort CV: RRR, no murmur  Abdomen: Soft, non-tender; non-distended, BS present  Extremities: No peripheral edema, warm Skin: Normal turgor and texture; no rash Psych: Appropriate affect Neuro: Alert and oriented to person and place, no focal deficit - BLE full to strength testing, I don't see clear gait abnormality    Vitals:   07/12/22 1414  BP: 122/70  Pulse: 74  Temp: 98.5 F (36.9 C)  TempSrc: Oral  SpO2: 97%  Weight: 165 lb (74.8 kg)  Height: '5\' 1"'$  (1.549 m)   97% on RA BMI Readings from Last 3 Encounters:  07/12/22 31.18 kg/m  02/15/22 30.57 kg/m  11/30/21 31.25 kg/m   Wt Readings from Last 3 Encounters:  07/12/22 165 lb (74.8 kg)  02/15/22 161 lb 12.8 oz (73.4 kg)  11/30/21 162 lb 11.2 oz (73.8 kg)     CBC    Component Value Date/Time   WBC 6.9 05/29/2022 1340   RBC 4.15 05/29/2022 1340   HGB 13.1 05/29/2022 1340   HCT 39.6 05/29/2022 1340   PLT 256 05/29/2022 1340   MCV 95.4 05/29/2022 1340   MCH 31.6 05/29/2022 1340   MCHC 33.1 05/29/2022 1340   RDW 12.6 05/29/2022 1340   LYMPHSABS 2,408 05/29/2022 1340   MONOABS 0.3 09/27/2009 2128   EOSABS 200 05/29/2022 1340   BASOSABS 41 05/29/2022 1340    Chest Imaging: CTA CHest 11/30/21 reviewed by me with a couple very small foci of scar at lung bases  Pulmonary Functions Testing Results:     No data to display          Echocardiogram:   TTE done in 2016 but  report unavailable     Assessment & Plan:   # Dyspnea Doesn't bother her enough to work it up  # LLE weakness Seems full to strength testing BLE, no clear gait abnormality but she worries about effect of covid-19 and possible neuropathy causing weakness.  Plan: - Referral placed today for neurology for LLE weakness  - come back and see [place] if you'd like to investigate any trouble breathing you have    Maryjane Hurter, MD [facility] 07/12/2022 2:24 PM

## 2022-07-12 ENCOUNTER — Encounter: Payer: Self-pay | Admitting: Student

## 2022-07-12 ENCOUNTER — Encounter: Payer: Self-pay | Admitting: Neurology

## 2022-07-12 ENCOUNTER — Ambulatory Visit: Payer: Medicare HMO | Admitting: Student

## 2022-07-12 VITALS — BP 122/70 | HR 74 | Temp 98.5°F | Ht 61.0 in | Wt 165.0 lb

## 2022-07-12 DIAGNOSIS — R29898 Other symptoms and signs involving the musculoskeletal system: Secondary | ICD-10-CM

## 2022-07-12 NOTE — Patient Instructions (Signed)
-   Referral placed today for LLE weakness  - come back and see [place] if you'd like to investigate any trouble breathing you have

## 2022-07-19 ENCOUNTER — Other Ambulatory Visit: Payer: Self-pay | Admitting: Rheumatology

## 2022-07-20 NOTE — Telephone Encounter (Signed)
Next Visit: 08/16/2022   Last Visit: 02/15/2022   Last Fill: 06/06/2022 tramadol 50 mg 2-4 tablets daily for pain management.   UDS:12/06/2021   Narc Agreement: 12/14/2021   Okay to refill Tramadol?

## 2022-07-26 NOTE — Progress Notes (Signed)
Initial neurology clinic note  SERVICE DATE: 07/31/22 SERVICE TIME: 8:00 am  Reason for Evaluation: Consultation requested by Maryjane Hurter, MD for an opinion regarding left lower limb weakness. My final recommendations will be communicated back to the requesting physician by way of shared medical record or letter to requesting physician via [place] mail.  HPI: This is Ms. Paula Murphy, a 76 y.o. 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 The patient is accompanied by cousin.  Patient's symptoms started about 1.5 years ago after a COVID vaccine. About 2 weeks later, her left thigh felt numb. She also couldn't walk well. She feels like the entire leg is weak. She had an achy sensation in her thigh for about 6 months before it resolved. She saw her orthopedic doctor thinking it could be related to her knee. The knee checked out though. Her rheumatologist saw her after thinking it could be her fibromyalgia. She then made the connection to the COVID shot and thought that must be what is causing symptoms. She now has numbness in thigh and into the lower leg has tingling on the outside of knee. The numbness and tingling is intermittent. She denies saddle anesthesia. She denies back pain, though she does have chronic pain from fibromyalgia (no change to these symptoms).   She is now dragging her left leg affecting her life. She is also having difficulty holding urine, especially when standing. She is on oxybutynin and seeing urology for this. She denies bowel incontinence. She has no symptoms in the right leg and no symptoms in her arms.   Patient saw pulmonology (Dr. Verlee Monte) on 07/12/22 for 2.5 years of dyspnea. Her primary complaint at that time though was LLE weakness which she felt started after a COVID  vaccine. Per notes, she had dyspnea, could not hold urine, and had LLE weakness after the shot. Per exam, no focal weakness or gait abnormality was seen on 07/12/22. Patient was referred to neurology for further evaluation. Per patient, the SOB is related to dragging her left leg.   She has not had MRI or EMG. She is not on medications. She went to PT x 2 for her leg that improved symptoms for the time she was getting it, but then all improvement went away after stopping therapy. She last went to therapy about 1.5 months ago (for about 2 total months). She does some of her home exercises.  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 She has never been treated.  She does not report any constitutional symptoms like fever, night sweats, anorexia or unintentional weight loss.  EtOH use: very rare  Restrictive diet? No Family history of neuropathy/myopathy/NM disease? No   MEDICATIONS:  Outpatient Encounter Medications as of 07/31/2022  Medication Sig Note   amoxicillin (AMOXIL) 500 MG capsule Take 2,000 mg by mouth once. (Patient not taking: Reported on 07/12/2022) 09/28/2020: Dental precedure   Bisacodyl (LAXATIVE PO) Take 2 tablets by mouth daily. Vegetable    Cholecalciferol (VITAMIN D3) 5000 units CAPS Take 5,000 Units by mouth daily.     denosumab (PROLIA) 60 MG/ML SOSY injection Inject 60 mg into the skin every 6 (six) months. 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 Appt on 06/04/22  escitalopram (LEXAPRO) 10 MG tablet Take 10 mg by mouth daily.    ezetimibe (ZETIA) 10 MG tablet Take 10 mg by mouth daily.     fluticasone (FLONASE) 50 MCG/ACT nasal spray Place 1 spray into both nostrils daily as needed for allergies.     levothyroxine (SYNTHROID, LEVOTHROID) 125 MCG tablet Take 125 mcg by mouth daily before breakfast.    loperamide (IMODIUM) 2 MG capsule Take 1 capsule (2 mg total) by  mouth 4 (four) times daily as needed for diarrhea or loose stools.    Multiple Vitamins-Minerals (PRESERVISION AREDS) CAPS Take 1 capsule by mouth daily.    ondansetron (ZOFRAN-ODT) 4 MG disintegrating tablet Take 1 tablet (4 mg total) by mouth every 8 (eight) hours as needed.    oxybutynin (DITROPAN-XL) 10 MG 24 hr tablet Take 10 mg by mouth at bedtime.    pantoprazole (PROTONIX) 40 MG tablet Take 40 mg by mouth daily.    simethicone (MYLICON) 841 MG chewable tablet 125 mg daily as needed for flatulence.     topiramate (TOPAMAX) 50 MG tablet TAKE 3 TABLETS EVERY DAY    traMADol (ULTRAM) 50 MG tablet TAKE 1 TO 2 TABLETS TWICE DAILY AS NEEDED    valACYclovir (VALTREX) 500 MG tablet Take 500 mg by mouth daily as needed (fever blister).     vitamin B-12 (CYANOCOBALAMIN) 500 MCG tablet Take 500 mcg by mouth daily.    No facility-administered encounter medications on file as of 07/31/2022.    PAST MEDICAL HISTORY: Past Medical History:  Diagnosis Date   Arthritis    OA AND PAIN RT KNEE   Autoimmune disease (HCC)    + ANA, and DS DNA--PT STATES SHE WAS TOLD SHE DOES NOT HAVE LUPUS AS PREVIOUSLY THOUGHT   Fibromyalgia    followed by Dr. Estanislado Pandy   Hemorrhoids    Hyperlipidemia    Hypothyroidism    Kidney stone    Macular degeneration    BOTH EYES   PONV (postoperative nausea and vomiting)    Thyroid disease    Hypothyroidism    PAST SURGICAL HISTORY: Past Surgical History:  Procedure Laterality Date   ABDOMINAL HYSTERECTOMY  1989   BUNIONECTOMY Left 2012   CHOLECYSTECTOMY  1996   EYE SURGERY     BILATERAL CATARACT EXTRACTION   KNEE ARTHROSCOPY  1994   Right Knee   KNEE SURGERY     NECK SURGERY  2003   TONSILLECTOMY  1953   TOTAL KNEE ARTHROPLASTY Right 03/02/2013   Procedure: RIGHT TOTAL KNEE ARTHROPLASTY;  Surgeon: Gearlean Alf, MD;  Location: [facility];  Service: Orthopedics;  Laterality: Right;   TOTAL KNEE ARTHROPLASTY Left 10/10/2020   Procedure: TOTAL KNEE ARTHROPLASTY;   Surgeon: Gaynelle Arabian, MD;  Location: [facility];  Service: Orthopedics;  Laterality: Left;  23mn    ALLERGIES: Allergies  Allergen Reactions   Codeine Nausea And Vomiting    NAUSEA & VOMITING   Hydrocodone Nausea And Vomiting    FAMILY HISTORY: Family History  Problem Relation Age of Onset   Stroke Mother    Hypertension Mother    Hyperlipidemia Mother    Cancer Mother        Lung   Alzheimer's disease Mother    COPD Mother    Stroke Father    Hypertension Father    Diabetes Father        Type II   Hyperlipidemia Father    Cancer Father        Lung  Arthritis Father        Rheumatoid   COPD Father    COPD Brother    Thyroid disease Daughter    Breast cancer Cousin    Breast cancer Cousin     SOCIAL HISTORY: Social History   Tobacco Use   Smoking status: Never    Passive exposure: Past   Smokeless tobacco: Never  Vaping Use   Vaping Use: Never used  Substance Use Topics   Alcohol use: Not Currently    Comment: occasional   Drug use: No   Social History   Social History Narrative   Retired   Married   1 daughter/1 grandson   Never Smoked   Alcohol use - yes (social)     OBJECTIVE: PHYSICAL EXAM: BP 126/82   Pulse (!) 105   Ht 5' (1.524 m)   Wt 155 lb (70.3 kg)   SpO2 96%   BMI 30.27 kg/m   General: General appearance: Awake and alert. No distress. Cooperative with exam.  Skin: No obvious rash or jaundice. HEENT: Atraumatic. Anicteric. Lungs: Non-labored breathing on room air  Extremities: Mild peripheral edema in bilateral lower extremities. Psych: Affect appropriate.  Neurological: Mental Status: Alert. Speech fluent. No pseudobulbar affect Cranial Nerves: CNII: No RAPD. Visual fields intact. CNIII, IV, VI: PERRL. No nystagmus. EOMI.  CN V: Facial sensation intact bilaterally to fine touch. CN VII: Facial muscles symmetric and strong. Left eye sits lower with less sclera, ?Left ptosis but no change with sustained upgaze CN  VIII: Hears finger rub well bilaterally. CN IX: No hypophonia. CN X: Palate elevates symmetrically. CN XI: Full strength shoulder shrug bilaterally. CN XII: Tongue protrusion full and midline. No atrophy or fasciculations. No significant dysarthria Motor: Tone is normal. No fasciculations in extremities. No atrophy.  Individual muscle group testing (MRC grade out of 5):  Movement     Neck flexion 5    Neck extension 5     Right Left   Shoulder abduction 5 5   Shoulder adduction 5 5   Shoulder ext rotation 5 5   Shoulder int rotation 5 5   Elbow flexion 5 5   Elbow extension 5 5   Wrist extension 5 5   Wrist flexion 5 5   Finger abduction - FDI 5 5   Finger abduction - ADM 5 5   Finger extension 5 5   Finger distal flexion - 2/'3 5 5   '$ Finger distal flexion - 4/'5 5 5   '$ Thumb flexion - FPL 5 5   Thumb abduction - APB 5 5    Hip flexion 5 5 Pain in left leg with testing  Hip extension 5 5   Hip adduction 5 5   Hip abduction 5 5   Knee extension 5 5   Knee flexion 5 5   Dorsiflexion 5 5   Plantarflexion 5 5   Great toe extension 5 5   Great toe flexion 5 5     Reflexes:  Right Left   Bicep 2+ 2+   Tricep 2+ 2+   BrRad 2+ 2+   Knee 2+ 2+   Ankle 2+ 2+    Pathological Reflexes: Babinski: flexor response bilaterally Hoffman: absent bilaterally Troemner: absent bilaterally Sensation: Pinprick: Intact except: Absent in left thigh and left lower leg but intact in left foot Vibration: Intact in all extremities Proprioception: Intact in bilateral great toes Coordination: Intact finger-to- nose-finger bilaterally. Romberg negative. Gait: Able to rise from chair with  arms crossed unassisted. Narrow-based gait. Very slow, short steps. No imbalance.  Lab and Test Review: Internal labs: Normal or unremarkable: CBC, vit D, TSH, SSA, SSB  05/29/22: ANA: positive (1:80) dsDNA ab: elevated to 71 CMP remarkable for mildly elevated glucose (108)  Spine xray  (03/07/2002): PORTABLE LATERAL VIEW OF THE CERVICAL SPINE - #1  AN INITIAL FILM SHOWS A NEEDLE MARKING THE ANTERIOR DISC SPACE AT C5-6.  PORTABLE LATERAL VIEW OF THE CERVICAL SPINE - #2  A SECOND FILM SHOWS ANTERIOR CERVICAL DISKECTOMY FROM C5 TO C7.  INTERBODY PLUGS APPEAR WELL  POSITIONED AND THERE IS AN ANTERIOR PLATE WITH FIXATION SCREWS WHICH APPEAR WELL POSITIONED.  IMPRESSION  ACDF C5 TO C7.  ASSESSMENT: Paula Murphy is a 76 y.o. female who presents for evaluation of left leg weakness, numbness, tingling, and urinary incontinence. 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 Her neurological examination is pertinent for sensory loss in the left thigh and leg with sparing of the left foot. Available diagnostic data is significant for normal TSH, mildly positive ANA and dsDNA ab positive.   Overall, patient's symptoms are difficult to localize. Her symptoms are focal and not generalized as might be expected with a generalized myopathy or neuropathy or neuromuscular junction disorder. Her exam shows no weakness and reflexes are intact. Her sensory exam does not follow a nerve or nerve root distribution. There is no signs of cauda equina or conus medullaris syndrome on exam that would explain urinary incontinence. I will start with lab work and an EMG to attempt to further localize symptoms. I discussed with patient that her symptoms are not clearly neurologic, but that we would investigate further.   PLAN: -Blood work: CK, B12, HbA1c -EMG of LLE (add iliacus) -Recommended patient continue home exercises given by therapy -Recommended she continue follow up with rheumatology given positive dsDNA ab positivity and the question of SLE  -Return to clinic in 3 months  The impression above as well as the plan as outlined below were extensively discussed with the patient (in the company of cousin)  who voiced understanding. All questions were answered to their satisfaction.  When available, results of the above investigations and possible further recommendations will be communicated to the patient via telephone/MyChart. Patient to call office if not contacted after expected testing turnaround time.   Total time spent reviewing records, interview, history/exam, documentation, and coordination of care on day of encounter:  70 min   Thank you for allowing me to participate in patient's care.  If I can answer any additional questions, I would be pleased to do so.  Kai Levins, MD   CC: Reynold Bowen, [place] [address]  CC: Referring provider: Maryjane Hurter, [address],  [place] [address]

## 2022-07-31 ENCOUNTER — Encounter: Payer: Self-pay | Admitting: Neurology

## 2022-07-31 ENCOUNTER — Other Ambulatory Visit (INDEPENDENT_AMBULATORY_CARE_PROVIDER_SITE_OTHER): Payer: Medicare HMO

## 2022-07-31 ENCOUNTER — Ambulatory Visit: Payer: Medicare HMO | Admitting: Neurology

## 2022-07-31 VITALS — BP 126/82 | HR 105 | Ht 60.0 in | Wt 155.0 lb

## 2022-07-31 DIAGNOSIS — R29898 Other symptoms and signs involving the musculoskeletal system: Secondary | ICD-10-CM

## 2022-07-31 DIAGNOSIS — Z131 Encounter for screening for diabetes mellitus: Secondary | ICD-10-CM | POA: Diagnosis not present

## 2022-07-31 DIAGNOSIS — R200 Anesthesia of skin: Secondary | ICD-10-CM

## 2022-07-31 DIAGNOSIS — R32 Unspecified urinary incontinence: Secondary | ICD-10-CM

## 2022-07-31 DIAGNOSIS — R202 Paresthesia of skin: Secondary | ICD-10-CM

## 2022-07-31 LAB — VITAMIN B12: Vitamin B-12: 1413 pg/mL — ABNORMAL HIGH (ref 211–911)

## 2022-07-31 LAB — CK: Total CK: 36 U/L (ref 7–177)

## 2022-07-31 LAB — HEMOGLOBIN A1C: Hgb A1c MFr Bld: 5.7 % (ref 4.6–6.5)

## 2022-07-31 NOTE — Patient Instructions (Addendum)
I saw you today for weakness, numbness, and tingling in the left leg. I am not sure the cause of your symptoms currently. I would like to do more investigations.  I would like to get some lab work today.  I would like to do a muscle and nerve test called an EMG (more information below). You can schedule this at the front desk before you leave today.  I will be in touch when I have the results of your labs.  I would like to see you back in clinic in about 3 months.  Continue your home exercises given by therapy.  Follow up with rheumatology.  Please let me know if you have any questions or concerns in the meantime.   The physicians and staff at [facility] are committed to providing excellent care. You may receive a survey requesting feedback about your experience at our office. We strive to receive "very good" responses to the survey questions. If you feel that your experience would prevent you from giving the office a "very good " response, please contact our office to try to remedy the situation. We may be reached at [phone]. Thank you for taking the time out of your busy day to complete the survey.   Kai Levins, MD [facility]  ELECTROMYOGRAM AND NERVE CONDUCTION STUDIES (EMG/NCS) INSTRUCTIONS  How to Prepare The neurologist conducting the EMG will need to know if you have certain medical conditions. Tell the neurologist and other EMG lab personnel if you: Have a pacemaker or any other electrical medical device Take blood-thinning medications Have hemophilia, a blood-clotting disorder that causes prolonged bleeding Bathing Take a shower or bath shortly before your exam in order to remove oils from your skin. Don't apply lotions or creams before the exam.  What to Expect You'll likely be asked to change into a hospital gown for the procedure and lie down on an examination table. The following explanations can help you understand what will happen during the exam.   Electrodes. The neurologist or a technician places surface electrodes at various locations on your skin depending on where you're experiencing symptoms. Or the neurologist may insert needle electrodes at different sites depending on your symptoms.  Sensations. The electrodes will at times transmit a tiny electrical current that you may feel as a twinge or spasm. The needle electrode may cause discomfort or pain that usually ends shortly after the needle is removed. If you are concerned about discomfort or pain, you may want to talk to the neurologist about taking a short break during the exam.  Instructions. During the needle EMG, the neurologist will assess whether there is any spontaneous electrical activity when the muscle is at rest - activity that isn't present in healthy muscle tissue - and the degree of activity when you slightly contract the muscle.  He or she will give you instructions on resting and contracting a muscle at appropriate times. Depending on what muscles and nerves the neurologist is examining, he or she may ask you to change positions during the exam.  After your EMG You may experience some temporary, minor bruising where the needle electrode was inserted into your muscle. This bruising should fade within several days. If it persists, contact your primary care doctor.

## 2022-08-02 NOTE — Progress Notes (Signed)
Office Visit Note  Patient: Paula Murphy             Date of Birth: 11-Oct-1946           MRN: [id number]             PCP: Reynold Bowen, MD Referring: Arlyss Repress, MD Visit Date: 08/16/2022 Occupation: '@GUAROCC'$ @  Subjective:  Left lower extremity pain and weakness  History of Present Illness: Paula Murphy is a 76 y.o. female with history of osteoporosis and osteoarthritis.  She remains on prolia 60 mg sq injections every 6 months.  Her last prolia injection was administered on 06/08/22.  Patient presents today to discuss the ongoing pain and hasweakness she is experiencing in the left lower extremity.  She has been having to use a cane to assist with ambulation.  The pain and weakness in her left leg has been limiting her activity which has been very frustrating for her.  She has been evaluated by pulmonology and had a recent follow-up with neurology Dr. Karren Burly.  She is scheduled for a nerve conduction study on 09/24/22 for further evaluation.  The patient reports that her neurologist was concerned that the symptoms she is experiencing in the left leg are secondary to lupus.  She is concerned that she is not on medication for lupus.  Activities of Daily Living:  Patient reports morning stiffness for all day. Patient Reports nocturnal pain.  Difficulty dressing/grooming: Denies Difficulty climbing stairs: Reports Difficulty getting out of chair: Reports Difficulty using hands for taps, buttons, cutlery, and/or writing: Reports  Review of Systems  Constitutional:  Positive for fatigue.  HENT:  Positive for mouth sores and mouth dryness.   Eyes:  Positive for dryness.  Respiratory:  Negative for difficulty breathing.   Cardiovascular:  Positive for swelling in legs/feet. Negative for chest pain and palpitations.  Gastrointestinal:  Negative for blood in stool, constipation and diarrhea.  Endocrine: Positive for increased urination.  Genitourinary:  Positive for involuntary  urination and nocturia.  Musculoskeletal:  Positive for joint pain, gait problem, joint pain, myalgias, muscle weakness, morning stiffness, muscle tenderness and myalgias. Negative for joint swelling.  Skin:  Negative for color change, rash, hair loss and sensitivity to sunlight.  Allergic/Immunologic: Negative for susceptible to infections.  Neurological:  Negative for dizziness and headaches.  Hematological:  Negative for swollen glands.  Psychiatric/Behavioral:  Positive for depressed mood. Negative for sleep disturbance. The patient is nervous/anxious.     PMFS History:  Patient Active Problem List   Diagnosis Date Noted   Primary osteoarthritis of left knee 10/10/2020   Ingrown toenail 02/02/2019   Primary osteoarthritis of both feet 03/21/2017   History of macular degeneration 03/21/2017   Other fatigue 03/21/2017   Age-related osteoporosis without current pathological fracture 03/21/2017   Esophageal reflux 06/29/2013   Actinic keratosis 04/11/2013   OA (osteoarthritis) of knee 03/02/2013   DEPRESSION 07/24/2010   Major depressive disorder, single episode, unspecified 07/24/2010   UTI 07/04/2010   ABDOMINAL BLOATING 01/20/2010   Fibromyalgia 10/04/2009   INSOMNIA, CHRONIC 10/04/2009   Autoimmune disease ([place]) 10/04/2009   Vitamin D deficiency 10/26/2008   HYPOTHYROIDISM 08/13/2008   HYPERLIPIDEMIA 08/13/2008   HYPERTENSION 08/13/2008   HEADACHE 08/13/2008   Muscle pain 08/12/2008    Past Medical History:  Diagnosis Date   Arthritis    OA AND PAIN RT KNEE   Autoimmune disease ([place])    + ANA, and DS DNA--PT STATES SHE WAS TOLD SHE DOES NOT  HAVE LUPUS AS PREVIOUSLY THOUGHT   Fibromyalgia    followed by Dr. Estanislado Pandy   Hemorrhoids    Hyperlipidemia    Hypothyroidism    Kidney stone    Macular degeneration    BOTH EYES   PONV (postoperative nausea and vomiting)    Thyroid disease    Hypothyroidism    Family History  Problem Relation Age of Onset   Stroke  Mother    Hypertension Mother    Hyperlipidemia Mother    Cancer Mother        Lung   Alzheimer's disease Mother    COPD Mother    Stroke Father    Hypertension Father    Diabetes Father        Type II   Hyperlipidemia Father    Cancer Father        Lung   Arthritis Father        Rheumatoid   COPD Father    COPD Brother    Thyroid disease Daughter    Breast cancer Cousin    Breast cancer Cousin    Past Surgical History:  Procedure Laterality Date   ABDOMINAL HYSTERECTOMY  1989   BUNIONECTOMY Left 2012   CHOLECYSTECTOMY  1996   EYE SURGERY     BILATERAL CATARACT EXTRACTION   KNEE ARTHROSCOPY  1994   Right Knee   KNEE SURGERY     NECK SURGERY  2003   TONSILLECTOMY  1953   TOTAL KNEE ARTHROPLASTY Right 03/02/2013   Procedure: RIGHT TOTAL KNEE ARTHROPLASTY;  Surgeon: Gearlean Alf, MD;  Location: [facility];  Service: Orthopedics;  Laterality: Right;   TOTAL KNEE ARTHROPLASTY Left 10/10/2020   Procedure: TOTAL KNEE ARTHROPLASTY;  Surgeon: Gaynelle Arabian, MD;  Location: [facility];  Service: Orthopedics;  Laterality: Left;  74mn   Social History   Social History Narrative   RetiredMarried1 daughter/1 gCuratoruse - yes (social)   Live in a one story home   Caffeine none   Right Handed   Immunization History  Administered Date(s) Administered   Influenza Whole 08/12/2008, 08/30/2009, 08/23/2010   PFIZER(Purple Top)SARS-COV-2 Vaccination 11/26/2019, 12/17/2019   Pneumococcal Conjugate-13 02/22/2015   Pneumococcal Polysaccharide-23 03/29/2009, 06/29/2013   Td 07/22/2007     Objective: Vital Signs: BP (!) 149/68 (BP Location: Left Wrist, Patient Position: Sitting, Cuff Size: Normal)   Pulse 75   Resp 17   Ht '5\' 1"'$  (1.549 m)   Wt 163 lb 6.4 oz (74.1 kg)   BMI 30.87 kg/m    Physical Exam Vitals and nursing note reviewed.  Constitutional:      Appearance: She is well-developed.  HENT:     Head: Normocephalic and atraumatic.  Eyes:      Conjunctiva/sclera: Conjunctivae normal.  Cardiovascular:     Rate and Rhythm: Normal rate and regular rhythm.     Heart sounds: Normal heart sounds.  Pulmonary:     Effort: Pulmonary effort is normal.     Breath sounds: Normal breath sounds.  Abdominal:     General: Bowel sounds are normal.     Palpations: Abdomen is soft.  Musculoskeletal:     Cervical back: Normal range of motion.  Skin:    General: Skin is warm and dry.     Capillary Refill: Capillary refill takes less than 2 seconds.  Neurological:     Mental Status: She is alert and oriented to person, place, and time.  Psychiatric:        Behavior: Behavior normal.  Musculoskeletal Exam: She had good range of motion of the cervical spine.  She had discomfort range of motion of her lumbar spine.  Shoulder joints, elbow joints, wrist joints, MCPs PIPs and DIPs with good range of motion with no synovitis.  Hip joints, knee joints, ankles, MTPs and PIPs been good range of motion with no synovitis.  She had tenderness on palpation of bilateral trochanteric bursa consistent with trochanteric bursitis.  She had generalized hyperalgesia and positive tender points.  CDAI Exam: CDAI Score: -- Patient Global: --; Provider Global: -- Swollen: --; Tender: -- Joint Exam 08/16/2022   No joint exam has been documented for this visit   There is currently no information documented on the homunculus. Go to the Rheumatology activity and complete the homunculus joint exam.  Investigation: No additional findings.  Imaging: No results found.  Recent Labs: Lab Results  Component Value Date   WBC 6.9 05/29/2022   HGB 13.1 05/29/2022   PLT 256 05/29/2022   NA 140 05/29/2022   K 4.3 05/29/2022   CL 107 05/29/2022   CO2 27 05/29/2022   GLUCOSE 108 (H) 05/29/2022   BUN 17 05/29/2022   CREATININE 0.89 05/29/2022   BILITOT 0.3 05/29/2022   ALKPHOS 54 11/30/2021   AST 12 05/29/2022   ALT 10 05/29/2022   PROT 6.3 05/29/2022    ALBUMIN 3.7 11/30/2021   CALCIUM 8.9 05/29/2022   GFRAA 85 11/30/2020    Speciality Comments: Prolia: 02/07/18, 08/19/18, 10/12/19, 04/11/20, 12/06/20, 06/04/21,12/06/2021, 06/08/22  Procedures:  Large Joint Inj: R greater trochanter on 08/16/2022 11:15 AM Indications: pain Details: 27 G 1.5 in needle, lateral approach  Arthrogram: No  Medications: 40 mg triamcinolone acetonide 40 MG/ML; 1.5 mL lidocaine 1 % Aspirate: 0 mL Outcome: tolerated well, no immediate complications Procedure, treatment alternatives, risks and benefits explained, specific risks discussed. Consent was given by the patient. Immediately prior to procedure a time out was called to verify the correct patient, procedure, equipment, support staff and site/side marked as required. Patient was prepped and draped in the usual sterile fashion.     Allergies: Codeine and Hydrocodone   Assessment / Plan:     Visit Diagnoses: Age-related osteoporosis without current pathological fracture - January 02, 2022 DEXA scan T score -1.9, BMD 0.642 AP spine 4% improvement.  Her both bone density is remarkably improved.  She had her last Prolia injection in August 2023.  She is scheduled to have repeat Prolia injection in February.  Use of calcium rich diet and exercise was emphasized.  Medication monitoring encounter - she received her last Prolia injection on December 06, 2021. last Prolia injection: 06/08/2022.  Vitamin D deficiency-vitamin D level was 46 on May 29, 2022.  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 Maureen Ralphs.  She continues to have some stiffness in her knee joints.  Patient states she was evaluated by Dr. Reynaldo Minium who felt her knee joints are doing well.  Encounter for chronic pain management - She takes tramadol 50 mg 2-4 tablets daily for pain management.   Primary osteoarthritis of both hands-no synovitis was noted.  She continues to have stiffness in her hands.  Primary  osteoarthritis of both feet-she is chronic discomfort.  No synovitis was noted.  Trochanteric bursitis of right hip-she was having pain and discomfort in her bilateral hip joints.  These findings are consistent with trochanteric bursitis.  Patient requested injection to her right trochanteric bursa.  After informed consent was obtained and side  effects were discussed the right trochanteric bursa was injected with lidocaine and cortisone as described above.  Patient tolerated the procedure well.  Postprocedure instructions were given.  IT band stretches were emphasized.  Fibromyalgia-she continues to have generalized discomfort and positive tender points.  Other fatigue-she has fatigue related to fibromyalgia and insomnia.  Primary insomnia-good sleep hygiene was discussed.  Positive ANA (antinuclear antibody) - January 17, 2022 AVISE lupus index 0.2, ANA positive, ANA titer negative, dsDNA equivocal, C1q positive, antithyroglobulin IgG positive.  Patient had positive ANA, double-stranded DNA, positive Ro and positive La antibody for many years.  Her complements have been normal.  She complains of sicca symptoms and oral ulcers.  Patient states that she had a discussion with her neurologist recently who was concerned that patient may have lupus.  She does not meet the criteria for lupus.  Patient wants to try hydroxychloroquine.  I did detailed discussion with the patient.  Indications, side effects and contraindications were discussed at length.  Patient wants to proceed with hydroxychloroquine.  A handout was given and consent was taken.  We will start her on hydroxychloroquine 200 mg p.o. daily as her height is 5 feet 1 inches.  I will also obtain autoimmune labs today.  I explained to her that her left lower extremity weakness and numbness is not related to positive ANA and double-stranded DNA.  She was advised to get baseline eye examination and then yearly eye examination to monitor for ocular toxicity.   We will check labs in a month, treatments and every 3 months to monitor for drug toxicity.  Patient was counseled on the purpose, proper use, and adverse effects of hydroxychloroquine including nausea/diarrhea, skin rash, headaches, and sun sensitivity.  Advised patient to wear sunscreen once starting hydroxychloroquine to reduce risk of rash associated with sun sensitivity.  Discussed importance of annual eye exams while on hydroxychloroquine to monitor to ocular toxicity and discussed importance of frequent laboratory monitoring.  Provided patient with eye exam form for baseline ophthalmologic exam.  Reviewed risk for QTC prolongation when used in combination with other QTc prolonging agents (including but not limited to antiarrhythmics, macrolide antibiotics, flouroquinolones, tricyclic antidepressants, citalopram, specific antipsychotics, ondansetron, migraine triptans, and methadone). Provided patient with educational materials on hydroxychloroquine and answered all questions.  Patient consented to hydroxychloroquine. Will upload consent in the media tab.    Left lower extremity weakness-patient complains of weakness in her left lower extremity since she had COVID-19 vaccination.  She has seen orthopedic surgeon and was told that her left total knee replacement is doing well.  She also went to see the neurologist.  She was evaluated by Dr. Melanee Spry.  He plans to do MRI and EMG and nerve conduction.  I reviewed his note from July 31, 2022.  He noted sensory loss in the left thigh and leg with the sparing of the left foot.  He felt that her sensory exam does not follow the nerve root distribution.  She had no signs of cauda equina.  He plans to do EMG for further evaluation.  He also obtain labs.  Exercises were Advised.  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  Further medical problems are listed as follows:  History of  hyperlipidemia  History of depression  History of hypothyroidism  History of hypertension-blood pressure was elevated today.  She is advised to monitor blood pressure closely and follow-up with her PCP.  History of macular  degeneration-she has history of macular degeneration which may interfere with the monitoring of Plaquenil toxicity.  I have advised her to discuss this further with her ophthalmologist.  History of anxiety  Orders: Orders Placed This Encounter  Procedures   Large Joint Inj: R greater trochanter   Protein / creatinine ratio, urine   CBC with Differential/Platelet   COMPLETE METABOLIC PANEL WITH GFR   ANA   Anti-DNA antibody, double-stranded   C3 and C4   Sedimentation rate   Sjogrens syndrome-A extractable nuclear antibody   Sjogrens syndrome-B extractable nuclear antibody   Meds ordered this encounter  Medications   hydroxychloroquine (PLAQUENIL) 200 MG tablet    Sig: Take 1 tablet (200 mg total) by mouth daily.    Dispense:  30 tablet    Refill:  2    Follow-Up Instructions: Return for Osteoarthritis, Osteoporosis.   Bo Merino, MD  Note - This record has been created using Editor, commissioning.  Chart creation errors have been sought, but may not always  have been located. Such creation errors do not reflect on  the standard of medical care.

## 2022-08-03 ENCOUNTER — Telehealth: Payer: Self-pay | Admitting: Neurology

## 2022-08-03 NOTE — Telephone Encounter (Signed)
PT would like to know the results of her lab work.

## 2022-08-06 ENCOUNTER — Telehealth: Payer: Self-pay

## 2022-08-06 NOTE — Telephone Encounter (Signed)
Pt called and informed of results per Dr. Berdine Addison. She understood.

## 2022-08-16 ENCOUNTER — Ambulatory Visit: Payer: Medicare HMO | Attending: Physician Assistant | Admitting: Rheumatology

## 2022-08-16 ENCOUNTER — Telehealth: Payer: Self-pay | Admitting: Rheumatology

## 2022-08-16 ENCOUNTER — Encounter: Payer: Self-pay | Admitting: Physician Assistant

## 2022-08-16 VITALS — BP 149/68 | HR 75 | Resp 17 | Ht 61.0 in | Wt 163.4 lb

## 2022-08-16 DIAGNOSIS — M19071 Primary osteoarthritis, right ankle and foot: Secondary | ICD-10-CM

## 2022-08-16 DIAGNOSIS — M19072 Primary osteoarthritis, left ankle and foot: Secondary | ICD-10-CM

## 2022-08-16 DIAGNOSIS — E559 Vitamin D deficiency, unspecified: Secondary | ICD-10-CM | POA: Diagnosis not present

## 2022-08-16 DIAGNOSIS — Z8679 Personal history of other diseases of the circulatory system: Secondary | ICD-10-CM

## 2022-08-16 DIAGNOSIS — F5101 Primary insomnia: Secondary | ICD-10-CM

## 2022-08-16 DIAGNOSIS — Z8659 Personal history of other mental and behavioral disorders: Secondary | ICD-10-CM

## 2022-08-16 DIAGNOSIS — G8929 Other chronic pain: Secondary | ICD-10-CM

## 2022-08-16 DIAGNOSIS — M810 Age-related osteoporosis without current pathological fracture: Secondary | ICD-10-CM

## 2022-08-16 DIAGNOSIS — Z96653 Presence of artificial knee joint, bilateral: Secondary | ICD-10-CM | POA: Diagnosis not present

## 2022-08-16 DIAGNOSIS — M797 Fibromyalgia: Secondary | ICD-10-CM

## 2022-08-16 DIAGNOSIS — M19041 Primary osteoarthritis, right hand: Secondary | ICD-10-CM

## 2022-08-16 DIAGNOSIS — R5383 Other fatigue: Secondary | ICD-10-CM

## 2022-08-16 DIAGNOSIS — M19042 Primary osteoarthritis, left hand: Secondary | ICD-10-CM

## 2022-08-16 DIAGNOSIS — R768 Other specified abnormal immunological findings in serum: Secondary | ICD-10-CM

## 2022-08-16 DIAGNOSIS — M7062 Trochanteric bursitis, left hip: Secondary | ICD-10-CM

## 2022-08-16 DIAGNOSIS — M7061 Trochanteric bursitis, right hip: Secondary | ICD-10-CM | POA: Diagnosis not present

## 2022-08-16 DIAGNOSIS — R7689 Other specified abnormal immunological findings in serum: Secondary | ICD-10-CM

## 2022-08-16 DIAGNOSIS — M5030 Other cervical disc degeneration, unspecified cervical region: Secondary | ICD-10-CM

## 2022-08-16 DIAGNOSIS — Z8669 Personal history of other diseases of the nervous system and sense organs: Secondary | ICD-10-CM

## 2022-08-16 DIAGNOSIS — R29898 Other symptoms and signs involving the musculoskeletal system: Secondary | ICD-10-CM

## 2022-08-16 DIAGNOSIS — Z8639 Personal history of other endocrine, nutritional and metabolic disease: Secondary | ICD-10-CM

## 2022-08-16 DIAGNOSIS — Z5181 Encounter for therapeutic drug level monitoring: Secondary | ICD-10-CM | POA: Diagnosis not present

## 2022-08-16 DIAGNOSIS — I890 Lymphedema, not elsewhere classified: Secondary | ICD-10-CM

## 2022-08-16 MED ORDER — LIDOCAINE HCL 1 % IJ SOLN
1.5000 mL | INTRAMUSCULAR | Status: AC | PRN
  Administered 2022-08-16: 1.5 mL

## 2022-08-16 MED ORDER — HYDROXYCHLOROQUINE SULFATE 200 MG PO TABS: 200.0000 mg | ORAL_TABLET | Freq: Every day | ORAL | 2 refills | Status: DC

## 2022-08-16 MED ORDER — TRIAMCINOLONE ACETONIDE 40 MG/ML IJ SUSP
40.0000 mg | INTRAMUSCULAR | Status: AC | PRN
  Administered 2022-08-16: 40 mg via INTRA_ARTICULAR

## 2022-08-16 NOTE — Telephone Encounter (Signed)
Office note faxed.

## 2022-08-16 NOTE — Patient Instructions (Signed)
Hydroxychloroquine Tablets What is this medication? HYDROXYCHLOROQUINE (hye drox ee KLOR oh kwin) treats autoimmune conditions, such as rheumatoid arthritis and lupus. It works by slowing down an overactive immune system. It may also be used to prevent and treat malaria. It works by killing the parasite that causes malaria. It belongs to a group of medications called DMARDs. This medicine may be used for other purposes; ask your health care provider or pharmacist if you have questions. COMMON BRAND NAME(S): Plaquenil, Quineprox What should I tell my care team before I take this medication? They need to know if you have any of these conditions: Diabetes Eye disease, vision problems G6PD deficiency Heart disease History of irregular heartbeat If you often drink alcohol Kidney disease Liver disease Porphyria Psoriasis An unusual or allergic reaction to chloroquine, hydroxychloroquine, other medications, foods, dyes, or preservatives Pregnant or trying to get pregnant Breast-feeding How should I use this medication? Take this medication by mouth with a glass of water. Take it as directed on the prescription label. Do not cut, crush or chew this medication. Swallow the tablets whole. Take it with food. Do not take it more than directed. Take all of this medication unless your care team tells you to stop it early. Keep taking it even if you think you are better. Take products with antacids in them at a different time of day than this medication. Take this medication 4 hours before or 4 hours after antacids. Talk to your care team if you have questions. Talk to your care team about the use of this medication in children. While this medication may be prescribed for selected conditions, precautions do apply. Overdosage: If you think you have taken too much of this medicine contact a poison control center or emergency room at once. NOTE: This medicine is only for you. Do not share this medicine with  others. What if I miss a dose? If you miss a dose, take it as soon as you can. If it is almost time for your next dose, take only that dose. Do not take double or extra doses. What may interact with this medication? Do not take this medication with any of the following: Cisapride Dronedarone Pimozide Thioridazine This medication may also interact with the following: Ampicillin Antacids Cimetidine Cyclosporine Digoxin Kaolin Medications for diabetes, like insulin, glipizide, glyburide Medications for seizures like carbamazepine, phenobarbital, phenytoin Mefloquine Methotrexate Other medications that prolong the QT interval (cause an abnormal heart rhythm) Praziquantel This list may not describe all possible interactions. Give your health care provider a list of all the medicines, herbs, non-prescription drugs, or dietary supplements you use. Also tell them if you smoke, drink alcohol, or use illegal drugs. Some items may interact with your medicine. What should I watch for while using this medication? Visit your care team for regular checks on your progress. Tell your care team if your symptoms do not start to get better or if they get worse. You may need blood work done while you are taking this medication. If you take other medications that can affect heart rhythm, you may need more testing. Talk to your care team if you have questions. Your vision may be tested before and during use of this medication. Tell your care team right away if you have any change in your eyesight. This medication may cause serious skin reactions. They can happen weeks to months after starting the medication. Contact your care team right away if you notice fevers or flu-like symptoms with a rash. The   rash may be red or purple and then turn into blisters or peeling of the skin. Or, you might notice a red rash with swelling of the face, lips or lymph nodes in your neck or under your arms. If you or your family  notice any changes in your behavior, such as new or worsening depression, thoughts of harming yourself, anxiety, or other unusual or disturbing thoughts, or memory loss, call your care team right away. What side effects may I notice from receiving this medication? Side effects that you should report to your care team as soon as possible: Allergic reactions--skin rash, itching, hives, swelling of the face, lips, tongue, or throat Aplastic anemia--unusual weakness or fatigue, dizziness, headache, trouble breathing, increased bleeding or bruising Change in vision Heart rhythm changes--fast or irregular heartbeat, dizziness, feeling faint or lightheaded, chest pain, trouble breathing Infection--fever, chills, cough, or sore throat Low blood sugar (hypoglycemia)--tremors or shaking, anxiety, sweating, cold or clammy skin, confusion, dizziness, rapid heartbeat Muscle injury--unusual weakness or fatigue, muscle pain, dark yellow or brown urine, decrease in amount of urine Pain, tingling, or numbness in the hands or feet Rash, fever, and swollen lymph nodes Redness, blistering, peeling, or loosening of the skin, including inside the mouth Thoughts of suicide or self-harm, worsening mood, or feelings of depression Unusual bruising or bleeding Side effects that usually do not require medical attention (report to your care team if they continue or are bothersome): Diarrhea Headache Nausea Stomach pain Vomiting This list may not describe all possible side effects. Call your doctor for medical advice about side effects. You may report side effects to FDA at 1-800-FDA-1088. Where should I keep my medication? Keep out of the reach of children and pets. Store at room temperature up to 30 degrees C (86 degrees F). Protect from light. Get rid of any unused medication after the expiration date. To get rid of medications that are no longer needed or have expired: Take the medication to a medication take-back  program. Check with your pharmacy or law enforcement to find a location. If you cannot return the medication, check the label or package insert to see if the medication should be thrown out in the garbage or flushed down the toilet. If you are not sure, ask your care team. If it is safe to put it in the trash, empty the medication out of the container. Mix the medication with cat litter, dirt, coffee grounds, or other unwanted substance. Seal the mixture in a bag or container. Put it in the trash. NOTE: This sheet is a summary. It may not cover all possible information. If you have questions about this medicine, talk to your doctor, pharmacist, or health care provider.  2023 Elsevier/Gold Standard (2021-02-08 00:00:00)  Standing Labs We placed an order today for your standing lab work.   Please have your standing labs drawn in  1 month, 3 months and every 5 months  Please have your labs drawn 2 weeks prior to your appointment so that the provider can discuss your lab results at your appointment.  Please note that you may see your imaging and lab results in [place] before we have reviewed them. We will contact you once all results are reviewed. Please allow our office up to 72 hours to thoroughly review all of the results before contacting the office for clarification of your results.  Lab hours are: Monday through Thursday from 1:30 pm-4:30 pm and Friday from 1:30 pm- 4:00 pm  You may experience shorter wait times  on Monday, Thursday or Friday afternoons,.   Effective September 03, 2022, new lab hours will be: Monday through Thursday from 8:00 am -12:30 pm and 1:00 pm-5:00 pm and Friday from 8:00 am-12:00 pm.  Please be advised, all patients with office appointments requiring lab work will take precedent over walk-in lab work.   Labs are drawn by [facility]. Please bring your co-pay at the time of your lab draw.  You may receive a bill from [facility] for your lab work.  Please note if you are on  Hydroxychloroquine and and an order has been placed for a Hydroxychloroquine level, you will need to have it drawn 4 hours or more after your last dose.  If you wish to have your labs drawn at another location, please call the office 24 hours in advance so we can fax the orders.  The office is located at [address] [place], [place], [place] [address] No appointment is necessary.    If you have any questions regarding directions or hours of operation,  please call [phone].   As a reminder, please drink plenty of water prior to coming for your lab work. Thanks!   Vaccines You are taking a medication(s) that can suppress your immune system.  The following immunizations are recommended: Flu annually Covid-19  Td/Tdap (tetanus, diphtheria, pertussis) every 10 years Pneumonia (Prevnar 15 then Pneumovax 23 at least 1 year apart.  Alternatively, can take Prevnar 20 without needing additional dose) Shingrix: 2 doses from 4 weeks to 6 months apart  Please check with your PCP to make sure you are up to date.

## 2022-08-16 NOTE — Telephone Encounter (Signed)
Patient left a voicemail stating her eye doctor needs a note from Dr. Estanislado Pandy regarding the new medication.  Please fax to [phone] Dr. Eulas Post at [facility]

## 2022-09-12 ENCOUNTER — Ambulatory Visit: Payer: Medicare HMO | Admitting: Neurology

## 2022-09-14 ENCOUNTER — Other Ambulatory Visit: Payer: Self-pay | Admitting: *Deleted

## 2022-09-14 DIAGNOSIS — R768 Other specified abnormal immunological findings in serum: Secondary | ICD-10-CM

## 2022-09-14 DIAGNOSIS — Z5181 Encounter for therapeutic drug level monitoring: Secondary | ICD-10-CM | POA: Diagnosis not present

## 2022-09-18 LAB — COMPLETE METABOLIC PANEL WITH GFR
AG Ratio: 1.4 (calc) (ref 1.0–2.5)
ALT: 9 U/L (ref 6–29)
AST: 12 U/L (ref 10–35)
Albumin: 3.9 g/dL (ref 3.6–5.1)
Alkaline phosphatase (APISO): 46 U/L (ref 37–153)
BUN: 22 mg/dL (ref 7–25)
CO2: 26 mmol/L (ref 20–32)
Calcium: 9 mg/dL (ref 8.6–10.4)
Chloride: 109 mmol/L (ref 98–110)
Creat: 0.88 mg/dL (ref 0.60–1.00)
Globulin: 2.7 g/dL (calc) (ref 1.9–3.7)
Glucose, Bld: 91 mg/dL (ref 65–99)
Potassium: 4.1 mmol/L (ref 3.5–5.3)
Sodium: 141 mmol/L (ref 135–146)
Total Bilirubin: 0.3 mg/dL (ref 0.2–1.2)
Total Protein: 6.6 g/dL (ref 6.1–8.1)
eGFR: 68 mL/min/{1.73_m2} (ref >=60)

## 2022-09-18 LAB — CBC WITH DIFFERENTIAL/PLATELET
Absolute Monocytes: 374 cells/uL (ref 200–950)
Basophils Absolute: 50 cells/uL (ref 0–200)
Basophils Relative: 0.9 %
Eosinophils Absolute: 154 cells/uL (ref 15–500)
Eosinophils Relative: 2.8 %
HCT: 41.9 % (ref 35.0–45.0)
Hemoglobin: 13.8 g/dL (ref 11.7–15.5)
Lymphs Abs: 2272 cells/uL (ref 850–3900)
MCH: 31.4 pg (ref 27.0–33.0)
MCHC: 32.9 g/dL (ref 32.0–36.0)
MCV: 95.2 fL (ref 80.0–100.0)
MPV: 10.7 fL (ref 7.5–12.5)
Monocytes Relative: 6.8 %
Neutro Abs: 2651 cells/uL (ref 1500–7800)
Neutrophils Relative %: 48.2 %
Platelets: 232 10*3/uL (ref 140–400)
RBC: 4.4 10*6/uL (ref 3.80–5.10)
RDW: 12 % (ref 11.0–15.0)
Total Lymphocyte: 41.3 %
WBC: 5.5 10*3/uL (ref 3.8–10.8)

## 2022-09-18 LAB — SEDIMENTATION RATE: Sed Rate: 2 mm/h (ref 0–30)

## 2022-09-18 LAB — PROTEIN / CREATININE RATIO, URINE
Creatinine, Urine: 253 mg/dL (ref 20–275)
Protein/Creat Ratio: 111 mg/g creat (ref 24–184)
Protein/Creatinine Ratio: 0.111 mg/mg creat (ref 0.024–0.184)
Total Protein, Urine: 28 mg/dL — ABNORMAL HIGH (ref 5–24)

## 2022-09-18 LAB — C3 AND C4
C3 Complement: 128 mg/dL (ref 83–193)
C4 Complement: 28 mg/dL (ref 15–57)

## 2022-09-18 LAB — SJOGRENS SYNDROME-B EXTRACTABLE NUCLEAR ANTIBODY: SSB (La) (ENA) Antibody, IgG: <1 AI

## 2022-09-18 LAB — ANTI-NUCLEAR AB-TITER (ANA TITER): ANA Titer 1: 1:80 {titer} — ABNORMAL HIGH

## 2022-09-18 LAB — SJOGRENS SYNDROME-A EXTRACTABLE NUCLEAR ANTIBODY: SSA (Ro) (ENA) Antibody, IgG: <1 AI

## 2022-09-18 LAB — ANA: Anti Nuclear Antibody (ANA): POSITIVE — AB

## 2022-09-18 LAB — ANTI-DNA ANTIBODY, DOUBLE-STRANDED: ds DNA Ab: 67 IU/mL — ABNORMAL HIGH

## 2022-09-19 ENCOUNTER — Telehealth: Payer: Self-pay | Admitting: *Deleted

## 2022-09-19 DIAGNOSIS — R768 Other specified abnormal immunological findings in serum: Secondary | ICD-10-CM

## 2022-09-19 DIAGNOSIS — Z5181 Encounter for therapeutic drug level monitoring: Secondary | ICD-10-CM

## 2022-09-19 NOTE — Telephone Encounter (Signed)
-----  Message from Ofilia Neas, PA-C sent at 09/18/2022  9:27 PM EST ----- CBC and CMP WNL.  ESR WNL. Complements WNL.  dsDNA remains positive but is trending down.  ANA remains positive but is a low titer.  Ro and La antibodies negaitve.  Total urine protein is borderline elevated.   Recommend rechecking with next lab work. Please place future order.

## 2022-09-24 ENCOUNTER — Ambulatory Visit: Payer: Medicare HMO | Admitting: Neurology

## 2022-09-24 ENCOUNTER — Telehealth: Payer: Self-pay | Admitting: Neurology

## 2022-09-24 DIAGNOSIS — Z131 Encounter for screening for diabetes mellitus: Secondary | ICD-10-CM

## 2022-09-24 DIAGNOSIS — R202 Paresthesia of skin: Secondary | ICD-10-CM | POA: Diagnosis not present

## 2022-09-24 DIAGNOSIS — R29898 Other symptoms and signs involving the musculoskeletal system: Secondary | ICD-10-CM

## 2022-09-24 DIAGNOSIS — R32 Unspecified urinary incontinence: Secondary | ICD-10-CM

## 2022-09-24 DIAGNOSIS — R200 Anesthesia of skin: Secondary | ICD-10-CM

## 2022-09-24 NOTE — Telephone Encounter (Signed)
Discussed the results of patient's EMG after the procedure today. It showed no significant abnormalities. I explained that there was no evidence of nerve damage or muscle disease at this point. It is more likely that patient's symptoms are related to fibromyalgia or rheumatologic disease (has SLE).  All questions were answered.  Kai Levins, MD [facility]

## 2022-09-24 NOTE — Procedures (Signed)
[facility]  [place], [address]  [place], [place] [address] Tel: [phone] Fax:  [phone] Test Date:  09/24/2022  Patient: Paula Murphy DOB: 1946-05-06 Physician: Kai Levins, MD  Sex: Female Height: '5\' 1"'$  Ref Phys: Kai Levins, MD  ID#: [id number]   Technician:    Patient Complaints: This is a 76 year old female with leg leg weakness and numbness.  NCV & EMG Findings: Extensive electrodiagnostic evaluation of the left lower limb with additional nerve conduction studies in the right lower limb shows: Left sural and superficial peroneal sensory responses are within normal limits. Bilateral peroneal (EDB) motor responses show reduced amplitude (L1.4, R1.8 mV). Left peroneal (TA) and bilateral tibial (AH) motor responses are within normal limits. H reflex is absent bilaterally. Chronic motor axon loss changes without accompanying active denervation changes are seen in the left extensor digitorum brevis muscle. All other tested muscles are within normal limits with normal motor unit configuration and recruitment patterns.  Impression: This electrodiagnostic study shows no significant abnormalities. Specifically: No electrodiagnostic evidence of a large fiber polyneuropathy. No electrodiagnostic evidence of a left lumbosacral (L3-S1) motor radiculopathy. No electrodiagnostic evidence of myopathy. Chronic (no active) motor axon loss changes are seen isolated to the intrinsic foot muscles. This finding in isolation is of unclear clinical significance and can be seen as a result of chronic repetitive local trauma from footwear, among other possibilities. Reduced amplitude of bilateral peroneal (EDB) motor responses are likely due to #4 above vs technical in nature. Absent H reflex may be normal for age and in isolation are of unclear clinical significance.   ___________________________ Kai Levins, MD    Nerve Conduction Studies Anti Sensory Summary Table    Stim Site NR Peak (ms) Norm Peak (ms) O-P Amp (V) Norm O-P Amp  Left Sup Peroneal Anti Sensory (Ant Lat Mall)  32.5C  12 cm    4.2 <4.6 13.6 >3  Left Sural Anti Sensory (Lat Mall)  32.5C  Calf    2.4 <4.6 4.6 >3   Motor Summary Table   Stim Site NR Onset (ms) Norm Onset (ms) O-P Amp (mV) Norm O-P Amp Site1 Site2 Delta-0 (ms) Dist (cm) Vel (m/s) Norm Vel (m/s)  Left Peroneal Motor (Ext Dig Brev)  32.5C  Ankle    3.8 <6.0 1.4 >2.5 B Fib Ankle 5.2 28.0 54 >40  B Fib    9.0  1.2  post exer B Fib 6.0 0.0  >40  post exer    3.0  0.6         Right Peroneal Motor (Ext Dig Brev)  32.5C  Ankle    3.9 <6.0 1.8 >2.5        Left Peroneal TA Motor (Tib Ant)  32.5C  Fib Head    2.7 <4.5 3.2 >3 Poplit Fib Head 1.9 8.0 42 >40  Poplit    4.6  2.8         Left Tibial Motor (Abd Hall Brev)  32.5C  Ankle    5.2 <6.0 6.3 >4 Knee Ankle 5.8 36.0 62 >40  Knee    11.0  4.6         Right Tibial Motor (Abd Hall Brev)  32.5C  Ankle    4.8 <6.0 5.6 >4         H Reflex Studies   NR H-Lat (ms) Lat Norm (ms) L-R H-Lat (ms)  Left Tibial (Gastroc)  32.5C  NR  <35   Right Tibial (Gastroc)  32.[place]  NR  <35    EMG   Side Muscle Ins Act Fibs Fasc Recrt Dur. Amp. Poly. Activation Comment  Left AntTibialis Nml Nml Nml Nml Nml Nml Nml Nml N/A  Left Gastroc Nml Nml Nml Nml Nml Nml Nml Nml N/A  Left RectFemoris Nml Nml Nml Nml Nml Nml Nml Nml N/A  Left BicepsFemS Nml Nml Nml Nml Nml Nml Nml Nml N/A  Left GluteusMed Nml Nml Nml Nml Nml Nml Nml Nml N/A  Left Ext Dig Brev Nml Nml Nml 2- 1+ 1+ 1+ Nml N/A      Waveforms:

## 2022-10-04 NOTE — Progress Notes (Signed)
Office Visit Note  Patient: Paula Murphy             Date of Birth: [birth year]           MRN: [id number]             PCP: Reynold Bowen, MD Referring: Reynold Bowen, MD Visit Date: 10/17/2022 Occupation: '@GUAROCC'$ @  Subjective:  Medication management  History of Present Illness: Paula Murphy is a 76 y.o. female with history of osteoarthritis, osteoporosis, undifferentiated connective tissue disease.  She was given a trial of hydroxychloroquine at the last visit on August 16, 2022.  She has noticed decreased pain and discomfort and she has been taking hydroxychloroquine.  She would like to increase the dose of hydroxychloroquine.  She states her mobility has improved since she has been on the medication.  She continues to have some discomfort in her bilateral knee joints especially the left knee joint which is replaced.  She had some bursitis in her right hip which comes and goes.  She has stiffness in her hands.  She has generalized pain and discomfort from fibromyalgia.  Been taking tramadol 2 tablets every morning.  She states she is unable to function without tramadol.  She has been getting Prolia injections for osteoporosis.  Her last injection was on June 08, 2022.  She has been taking  vitamin D.    Activities of Daily Living:  Patient reports morning stiffness for a few minutes.   Patient Denies nocturnal pain.  Difficulty dressing/grooming: Denies Difficulty climbing stairs: Reports Difficulty getting out of chair: Denies Difficulty using hands for taps, buttons, cutlery, and/or writing: Reports  Review of Systems  Constitutional:  Positive for fatigue.  HENT:  Negative for mouth sores and mouth dryness.   Eyes:  Negative for dryness.  Respiratory:  Negative for shortness of breath.   Cardiovascular:  Negative for chest pain and palpitations.  Gastrointestinal:  Negative for blood in stool, constipation and diarrhea.  Endocrine: Negative for increased urination.   Genitourinary:  Negative for involuntary urination.  Musculoskeletal:  Positive for joint pain, gait problem, joint pain, myalgias, muscle weakness, morning stiffness, muscle tenderness and myalgias. Negative for joint swelling.  Skin:  Negative for color change, rash, hair loss and sensitivity to sunlight.  Allergic/Immunologic: Negative for susceptible to infections.  Neurological:  Negative for dizziness and headaches.  Hematological:  Negative for swollen glands.  Psychiatric/Behavioral:  Negative for depressed mood and sleep disturbance. The patient is not nervous/anxious.     PMFS History:  Patient Active Problem List   Diagnosis Date Noted   Primary osteoarthritis of left knee 10/10/2020   Ingrown toenail 02/02/2019   Primary osteoarthritis of both feet 03/21/2017   History of macular degeneration 03/21/2017   Other fatigue 03/21/2017   Age-related osteoporosis without current pathological fracture 03/21/2017   Esophageal reflux 06/29/2013   Actinic keratosis 04/11/2013   OA (osteoarthritis) of knee 03/02/2013   DEPRESSION 07/24/2010   Major depressive disorder, single episode, unspecified 07/24/2010   UTI 07/04/2010   ABDOMINAL BLOATING 01/20/2010   Fibromyalgia 10/04/2009   INSOMNIA, CHRONIC 10/04/2009   Autoimmune disease ([place]) 10/04/2009   Vitamin D deficiency 10/26/2008   HYPOTHYROIDISM 08/13/2008   HYPERLIPIDEMIA 08/13/2008   HYPERTENSION 08/13/2008   HEADACHE 08/13/2008   Muscle pain 08/12/2008    Past Medical History:  Diagnosis Date   Arthritis    OA AND PAIN RT KNEE   Autoimmune disease ([place])    + ANA, and DS DNA--PT STATES  SHE WAS TOLD SHE DOES NOT HAVE LUPUS AS PREVIOUSLY THOUGHT   Fibromyalgia    followed by Dr. Estanislado Pandy   Hemorrhoids    Hyperlipidemia    Hypothyroidism    Kidney stone    Macular degeneration    BOTH EYES   PONV (postoperative nausea and vomiting)    Thyroid disease    Hypothyroidism    Family History  Problem Relation Age  of Onset   Stroke Mother    Hypertension Mother    Hyperlipidemia Mother    Cancer Mother        Lung   Alzheimer's disease Mother    COPD Mother    Stroke Father    Hypertension Father    Diabetes Father        Type II   Hyperlipidemia Father    Cancer Father        Lung   Arthritis Father        Rheumatoid   COPD Father    COPD Brother    Thyroid disease Daughter    Breast cancer Cousin    Breast cancer Cousin    Past Surgical History:  Procedure Laterality Date   ABDOMINAL HYSTERECTOMY  1989   BUNIONECTOMY Left 2012   CHOLECYSTECTOMY  1996   EYE SURGERY     BILATERAL CATARACT EXTRACTION   KNEE ARTHROSCOPY  1994   Right Knee   KNEE SURGERY     NECK SURGERY  2003   TONSILLECTOMY  1953   TOTAL KNEE ARTHROPLASTY Right 03/02/2013   Procedure: RIGHT TOTAL KNEE ARTHROPLASTY;  Surgeon: Gearlean Alf, MD;  Location: [facility];  Service: Orthopedics;  Laterality: Right;   TOTAL KNEE ARTHROPLASTY Left 10/10/2020   Procedure: TOTAL KNEE ARTHROPLASTY;  Surgeon: Gaynelle Arabian, MD;  Location: [facility];  Service: Orthopedics;  Laterality: Left;  54mn   Social History   Social History Narrative   RetiredMarried1 daughter/1 gCuratoruse - yes (social)   Live in a one story home   Caffeine none   Right Handed   Immunization History  Administered Date(s) Administered   Influenza Whole 08/12/2008, 08/30/2009, 08/23/2010   PFIZER(Purple Top)SARS-COV-2 Vaccination 11/26/2019, 12/17/2019   Pneumococcal Conjugate-13 02/22/2015   Pneumococcal Polysaccharide-23 03/29/2009, 06/29/2013   Td 07/22/2007     Objective: Vital Signs: BP (!) 139/93 (BP Location: Left Arm, Patient Position: Sitting, Cuff Size: Normal)   Pulse 83   Resp 15   Ht '5\' 1"'$  (1.549 m)   Wt 159 lb 9.6 oz (72.4 kg)   BMI 30.16 kg/m    Physical Exam Vitals and nursing note reviewed.  Constitutional:      Appearance: She is well-developed.  HENT:     Head: Normocephalic and atraumatic.   Eyes:     Conjunctiva/sclera: Conjunctivae normal.  Cardiovascular:     Rate and Rhythm: Normal rate and regular rhythm.     Heart sounds: Normal heart sounds.  Pulmonary:     Effort: Pulmonary effort is normal.     Breath sounds: Normal breath sounds.  Abdominal:     General: Bowel sounds are normal.     Palpations: Abdomen is soft.  Musculoskeletal:     Cervical back: Normal range of motion.  Lymphadenopathy:     Cervical: No cervical adenopathy.  Skin:    General: Skin is warm and dry.     Capillary Refill: Capillary refill takes less than 2 seconds.  Neurological:     Mental Status: She is alert and oriented to  person, place, and time.  Psychiatric:        Behavior: Behavior normal.      Musculoskeletal Exam: Cervical spine is in good range of motion.  She had painful range of motion of the lumbar spine.  Shoulder joints, elbow joints, wrist joints with good range of motion.  Wrist joints, MCPs PIPs and DIPs with good range of motion with no synovitis.  Hip joints and knee joints with good range of motion.  She had no tenderness over ankles or MTPs.  She had positive tender points and generalized hyperalgesia.  CDAI Exam: CDAI Score: -- Patient Global: --; Provider Global: -- Swollen: --; Tender: -- Joint Exam 10/17/2022   No joint exam has been documented for this visit   There is currently no information documented on the homunculus. Go to the Rheumatology activity and complete the homunculus joint exam.  Investigation: No additional findings.  Imaging: NCV with EMG(electromyography)  Result Date: 09/24/2022 Shellia Carwin, MD     09/24/2022 12:47 PM [facility] [address], [place]  [place],  [address] Tel: [phone] Fax:  [phone] Test Date:  09/24/2022 Patient: Norvella Loscalzo DOB: 03/31/1946 Physician: Kai Levins, MD Sex: Female Height: '5\' 1"'$  Ref Phys: Kai Levins, MD ID#: [id number]   Technician:  Patient Complaints: This is a 76  year old female with leg leg weakness and numbness. NCV & EMG Findings: Extensive electrodiagnostic evaluation of the left lower limb with additional nerve conduction studies in the right lower limb shows: Left sural and superficial peroneal sensory responses are within normal limits. Bilateral peroneal (EDB) motor responses show reduced amplitude (L1.4, R1.8 mV). Left peroneal (TA) and bilateral tibial (AH) motor responses are within normal limits. H reflex is absent bilaterally. Chronic motor axon loss changes without accompanying active denervation changes are seen in the left extensor digitorum brevis muscle. All other tested muscles are within normal limits with normal motor unit configuration and recruitment patterns. Impression: This electrodiagnostic study shows no significant abnormalities. Specifically: No electrodiagnostic evidence of a large fiber polyneuropathy. No electrodiagnostic evidence of a left lumbosacral (L3-S1) motor radiculopathy. No electrodiagnostic evidence of myopathy. Chronic (no active) motor axon loss changes are seen isolated to the intrinsic foot muscles. This finding in isolation is of unclear clinical significance and can be seen as a result of chronic repetitive local trauma from footwear, among other possibilities. Reduced amplitude of bilateral peroneal (EDB) motor responses are likely due to #4 above vs technical in nature. Absent H reflex may be normal for age and in isolation are of unclear clinical significance. ___________________________ Kai Levins, MD Nerve Conduction Studies Anti Sensory Summary Table  Stim Site NR Peak (ms) Norm Peak (ms) O-P Amp (V) Norm O-P Amp Left Sup Peroneal Anti Sensory (Ant Lat Mall)  32.5C 12 cm    4.2 <4.6 13.6 >3 Left Sural Anti Sensory (Lat Mall)  32.5C Calf    2.4 <4.6 4.6 >3 Motor Summary Table  Stim Site NR Onset (ms) Norm Onset (ms) O-P Amp (mV) Norm O-P Amp Site1 Site2 Delta-0 (ms) Dist (cm) Vel (m/s) Norm Vel (m/s) Left Peroneal  Motor (Ext Dig Brev)  32.5C Ankle    3.8 <6.0 1.4 >2.5 B Fib Ankle 5.2 28.0 54 >40 B Fib    9.0  1.2  post exer B Fib 6.0 0.0  >40 post exer    3.0  0.6        Right Peroneal Motor (Ext Dig Brev)  32.5C Ankle  3.9 <6.0 1.8 >2.5       Left Peroneal TA Motor (Tib Ant)  32.5C Fib Head    2.7 <4.5 3.2 >3 Poplit Fib Head 1.9 8.0 42 >40 Poplit    4.6  2.8        Left Tibial Motor (Abd Hall Brev)  32.5C Ankle    5.2 <6.0 6.3 >4 Knee Ankle 5.8 36.0 62 >40 Knee    11.0  4.6        Right Tibial Motor (Abd Hall Brev)  32.5C Ankle    4.8 <6.0 5.6 >4       H Reflex Studies  NR H-Lat (ms) Lat Norm (ms) L-R H-Lat (ms) Left Tibial (Gastroc)  32.5C NR  <35  Right Tibial (Gastroc)  32.5C NR  <35  EMG  Side Muscle Ins Act Fibs Fasc Recrt Dur. Amp. Poly. Activation Comment Left AntTibialis Nml Nml Nml Nml Nml Nml Nml Nml N/A Left Gastroc Nml Nml Nml Nml Nml Nml Nml Nml N/A Left RectFemoris Nml Nml Nml Nml Nml Nml Nml Nml N/A Left BicepsFemS Nml Nml Nml Nml Nml Nml Nml Nml N/A Left GluteusMed Nml Nml Nml Nml Nml Nml Nml Nml N/A Left Ext Dig Brev Nml Nml Nml 2- 1+ 1+ 1+ Nml N/A Waveforms:             Recent Labs: Lab Results  Component Value Date   WBC 5.5 09/14/2022   HGB 13.8 09/14/2022   PLT 232 09/14/2022   NA 141 09/14/2022   K 4.1 09/14/2022   CL 109 09/14/2022   CO2 26 09/14/2022   GLUCOSE 91 09/14/2022   BUN 22 09/14/2022   CREATININE 0.88 09/14/2022   BILITOT 0.3 09/14/2022   ALKPHOS 54 11/30/2021   AST 12 09/14/2022   ALT 9 09/14/2022   PROT 6.6 09/14/2022   ALBUMIN 3.7 11/30/2021   CALCIUM 9.0 09/14/2022   GFRAA 85 11/30/2020    Speciality Comments: PLQ Eye Exam: 08/22/2022 WNL @ [facility] Follow up in 1 year  Prolia: 02/07/18, 08/19/18, 10/12/19, 04/11/20, 12/06/20, 06/04/21,12/06/2021, 06/08/22  Procedures:  No procedures performed Allergies: Codeine and Hydrocodone   Assessment / Plan:     Visit Diagnoses: Age-related osteoporosis without current pathological fracture - January 02, 2022 DEXA scan T score -1.9, BMD 0.642 AP spine 4% improvement.  She has been on Prolia since April 2019.  Her last Prolia injection was on June 08, 2022.  She denies any side effects from Prolia.  She has been taking vitamin D and has been exercising some.  Medication monitoring encounter - she received her last Prolia injection on December 06, 2021. last Prolia injection: 06/08/2022. -She has been on tramadol for pain management.  Will check UDS today.  Plan: DRUG MONITOR, TRAMADOL,QN, URINE, DRUG MONITOR, PANEL 5, W/CONF, URINE  Vitamin D deficiency-vitamin D is normal on the supplement.  Undifferentiated connective tissue disease (HCC) -positive ANA, positive double-stranded DNA, fatigue, arthralgias.  Patient was placed on hydroxychloroquine as a trial in October 2023.  She notices improvement in fatigue and arthralgias.  She has been taking hydroxychloroquine 200 mg p.o. daily.  Patient wants to increase the dose of hydroxychloroquine.  Side effects were reviewed again.  Her dose of hydroxychloroquine was increased to 200 mg p.o. twice daily Monday to Friday.  Side effects of hydroxychloroquine were reviewed.  High risk medication use-Labs from September 14, 2022 were reviewed.  CBC and CMP were normal.  Complements and sed rate were normal.  Double-stranded DNA was positive and ANA was positive.  Advised her to get labs in February.  Her last eye examination was on August 22, 2022.  Information on immunization was placed in the AVS.  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 Maureen Ralphs.  She continues to have discomfort in her knee joints.  Encounter for chronic pain management - She takes tramadol 50 mg 2-4 tablets daily for pain management.  Patient states she has been taking tramadol only 2 tablets in the morning.  She notices improvement in her generalized pain.  Primary osteoarthritis of both hands-she has bilateral PIP and DIP thickening.  No  synovitis was noted.  Joint protection muscle strengthening was discussed.  Primary osteoarthritis of both feet-she continues to have discomfort in her feet.  Trochanteric bursitis of left hip-she is on the pain and discomfort in her left trochanteric region.  Patient states her trochanteric bursa pain has improved on Plaquenil.  IT band stretches were advised.  Fibromyalgia-she has generalized pain and discomfort from fibromyalgia.  She has positive tender points.  Need for regular exercise was emphasized.  Primary insomnia-sleep hygiene was discussed.  Other fatigue-related to insomnia and fibromyalgia.  Weakness of left lower extremity-lower extremity strength exercises were advised.  DDD (degenerative disc disease), cervical - status post ACDF of C5 C7.  She has limited range of motion of the cervical spine.  Other medical problems are listed as follows:  Lymphedema  History of hypothyroidism  History of depression  History of hyperlipidemia  History of hypertension  History of anxiety  History of macular degeneration  Orders: Orders Placed This Encounter  Procedures   DRUG MONITOR, TRAMADOL,QN, URINE   DRUG MONITOR, PANEL 5, W/CONF, URINE   No orders of the defined types were placed in this encounter.    Follow-Up Instructions: Return in about 6 months (around 04/18/2023) for Osteoporosis, Osteoarthritis.   Bo Merino, MD  Note - This record has been created using Editor, commissioning.  Chart creation errors have been sought, but may not always  have been located. Such creation errors do not reflect on  the standard of medical care.

## 2022-10-10 ENCOUNTER — Other Ambulatory Visit (HOSPITAL_COMMUNITY): Payer: Self-pay

## 2022-10-12 ENCOUNTER — Other Ambulatory Visit (HOSPITAL_COMMUNITY): Payer: Self-pay

## 2022-10-15 ENCOUNTER — Other Ambulatory Visit (HOSPITAL_COMMUNITY): Payer: Self-pay

## 2022-10-17 ENCOUNTER — Ambulatory Visit: Payer: Medicare HMO | Attending: Rheumatology | Admitting: Rheumatology

## 2022-10-17 ENCOUNTER — Other Ambulatory Visit: Payer: Self-pay

## 2022-10-17 ENCOUNTER — Encounter: Payer: Self-pay | Admitting: Rheumatology

## 2022-10-17 VITALS — BP 124/83 | HR 78 | Resp 15 | Ht 61.0 in | Wt 159.6 lb

## 2022-10-17 DIAGNOSIS — Z8669 Personal history of other diseases of the nervous system and sense organs: Secondary | ICD-10-CM

## 2022-10-17 DIAGNOSIS — R768 Other specified abnormal immunological findings in serum: Secondary | ICD-10-CM

## 2022-10-17 DIAGNOSIS — Z79899 Other long term (current) drug therapy: Secondary | ICD-10-CM

## 2022-10-17 DIAGNOSIS — M359 Systemic involvement of connective tissue, unspecified: Secondary | ICD-10-CM

## 2022-10-17 DIAGNOSIS — M810 Age-related osteoporosis without current pathological fracture: Secondary | ICD-10-CM

## 2022-10-17 DIAGNOSIS — M19041 Primary osteoarthritis, right hand: Secondary | ICD-10-CM | POA: Diagnosis not present

## 2022-10-17 DIAGNOSIS — M19042 Primary osteoarthritis, left hand: Secondary | ICD-10-CM

## 2022-10-17 DIAGNOSIS — M19072 Primary osteoarthritis, left ankle and foot: Secondary | ICD-10-CM

## 2022-10-17 DIAGNOSIS — G8929 Other chronic pain: Secondary | ICD-10-CM

## 2022-10-17 DIAGNOSIS — R5383 Other fatigue: Secondary | ICD-10-CM

## 2022-10-17 DIAGNOSIS — E559 Vitamin D deficiency, unspecified: Secondary | ICD-10-CM

## 2022-10-17 DIAGNOSIS — M19071 Primary osteoarthritis, right ankle and foot: Secondary | ICD-10-CM | POA: Diagnosis not present

## 2022-10-17 DIAGNOSIS — I890 Lymphedema, not elsewhere classified: Secondary | ICD-10-CM

## 2022-10-17 DIAGNOSIS — Z8659 Personal history of other mental and behavioral disorders: Secondary | ICD-10-CM

## 2022-10-17 DIAGNOSIS — Z5181 Encounter for therapeutic drug level monitoring: Secondary | ICD-10-CM | POA: Diagnosis not present

## 2022-10-17 DIAGNOSIS — M797 Fibromyalgia: Secondary | ICD-10-CM

## 2022-10-17 DIAGNOSIS — M5030 Other cervical disc degeneration, unspecified cervical region: Secondary | ICD-10-CM

## 2022-10-17 DIAGNOSIS — Z8639 Personal history of other endocrine, nutritional and metabolic disease: Secondary | ICD-10-CM

## 2022-10-17 DIAGNOSIS — Z96653 Presence of artificial knee joint, bilateral: Secondary | ICD-10-CM | POA: Diagnosis not present

## 2022-10-17 DIAGNOSIS — F5101 Primary insomnia: Secondary | ICD-10-CM

## 2022-10-17 DIAGNOSIS — Z8679 Personal history of other diseases of the circulatory system: Secondary | ICD-10-CM

## 2022-10-17 DIAGNOSIS — M7062 Trochanteric bursitis, left hip: Secondary | ICD-10-CM

## 2022-10-17 DIAGNOSIS — R29898 Other symptoms and signs involving the musculoskeletal system: Secondary | ICD-10-CM

## 2022-10-17 MED ORDER — HYDROXYCHLOROQUINE SULFATE 200 MG PO TABS: ORAL_TABLET | ORAL | 2 refills | Status: DC

## 2022-10-17 NOTE — Telephone Encounter (Signed)
Please review and sign pended plaquenil prescription that reflects dose change you advised today. Thanks!

## 2022-10-17 NOTE — Patient Instructions (Signed)
Standing Labs We placed an order today for your standing lab work.   Please have your standing labs drawn in February  Please have your labs drawn 2 weeks prior to your appointment so that the provider can discuss your lab results at your appointment.  Please note that you may see your imaging and lab results in [facility] before we have reviewed them. We will contact you once all results are reviewed. Please allow our office up to 72 hours to thoroughly review all of the results before contacting the office for clarification of your results.  Lab hours are:   Monday through Thursday from 8:00 am -12:30 pm and 1:00 pm-5:00 pm and Friday from 8:00 am-12:00 pm.  Please be advised, all patients with office appointments requiring lab work will take precedent over walk-in lab work.   Labs are drawn by [facility]. Please bring your co-pay at the time of your lab draw.  You may receive a bill from [facility] for your lab work.  Please note if you are on Hydroxychloroquine and and an order has been placed for a Hydroxychloroquine level, you will need to have it drawn 4 hours or more after your last dose.  If you wish to have your labs drawn at another location, please call the office 24 hours in advance so we can fax the orders.  The office is located at [address] [place], [place], [place] [address] No appointment is necessary.    If you have any questions regarding directions or hours of operation,  please call [phone].   As a reminder, please drink plenty of water prior to coming for your lab work. Thanks!   Vaccines You are taking a medication(s) that can suppress your immune system.  The following immunizations are recommended: Flu annually Covid-19  Td/Tdap (tetanus, diphtheria, pertussis) every 10 years Pneumonia (Prevnar 15 then Pneumovax 23 at least 1 year apart.  Alternatively, can take Prevnar 20 without needing additional dose) Shingrix: 2 doses from 4 weeks to 6 months  apart  Please check with your PCP to make sure you are up to date.

## 2022-10-19 NOTE — Progress Notes (Deleted)
I saw Paula Murphy in neurology clinic on 10/25/22 in follow up for left leg weakness.  HPI: Paula Murphy is a 76 y.o. 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  To briefly review: Patient's symptoms started about 1.5 years ago after a COVID vaccine. About 2 weeks later, her left thigh felt numb. She also couldn't walk well. She feels like the entire leg is weak. She had an achy sensation in her thigh for about 6 months before it resolved. She saw her orthopedic doctor thinking it could be related to her knee. The knee checked out though. Her rheumatologist saw her after thinking it could be her fibromyalgia. She then made the connection to the COVID shot and thought that must be what is causing symptoms. She now has numbness in thigh and into the lower leg has tingling on the outside of knee. The numbness and tingling is intermittent. She denies saddle anesthesia. She denies back pain, though she does have chronic pain from fibromyalgia (no change to these symptoms).    She is now dragging her left leg affecting her life. She is also having difficulty holding urine, especially when standing. She is on oxybutynin and seeing urology for this. She denies bowel incontinence. She has no symptoms in the right leg and no symptoms in her arms.    Patient saw pulmonology (Dr. Verlee Monte) on 07/12/22 for 2.5 years of dyspnea. Her primary complaint at that time though was LLE weakness which she felt started after a COVID vaccine. Per notes, she had dyspnea, could not hold urine, and had LLE weakness after the shot. Per exam, no focal weakness or gait abnormality was seen on 07/12/22. Patient was referred to neurology for further evaluation. Per patient, the SOB is related to dragging her left leg.    She went to PT x 2 for her leg that improved  symptoms for the time she was getting it, but then all improvement went away after stopping therapy. She last went to therapy about 1.5 months ago (for about 2 total months). She does some of her home exercises.   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 She has never been treated.   She does not report any constitutional symptoms like fever, night sweats, anorexia or unintentional weight loss.   EtOH use: very rare  Restrictive diet? No Family history of neuropathy/myopathy/NM disease? No  Most recent Assessment and Plan (07/31/22): Overall, patient's symptoms are difficult to localize. Her symptoms are focal and not generalized as might be expected with a generalized myopathy or neuropathy or neuromuscular junction disorder. Her exam shows no weakness and reflexes are intact. Her sensory exam does not follow a nerve or nerve root distribution. There is no signs of cauda equina or conus medullaris syndrome on exam that would explain urinary incontinence. I will start with lab work and an EMG to attempt to further localize symptoms. I discussed with patient that her symptoms are not clearly neurologic, but that we would investigate further.    PLAN: -Blood work: CK, B12, HbA1c -EMG of LLE (add iliacus) -Recommended patient continue home exercises given by therapy -Recommended she continue follow up with rheumatology given positive dsDNA ab positivity and the question of SLE  Since their last visit: Patient's  lab work that I sent showed no significant abnormalities. EMG on 09/24/22 also did not show significant abnormalities.  Rheumatology started patient on hydroxychloroquine in 08/2022 with some improvement in fatigue and arthralgias.  ***  ROS: Pertinent positive and negative systems reviewed in HPI. ***   MEDICATIONS:  Outpatient Encounter Medications as of 10/25/2022  Medication Sig Note    amoxicillin (AMOXIL) 500 MG capsule Take 2,000 mg by mouth once. 09/28/2020: Dental precedure   Bisacodyl (LAXATIVE PO) Take 2 tablets by mouth daily. Vegetable    Cholecalciferol (VITAMIN D3) 5000 units CAPS Take 5,000 Units by mouth daily.     denosumab (PROLIA) 60 MG/ML SOSY injection Inject 60 mg into the skin every 6 (six) months. Courier to rheum: [address] [place], [place] [address]. Appt on 06/04/22    escitalopram (LEXAPRO) 10 MG tablet Take 20 mg by mouth daily.    ezetimibe (ZETIA) 10 MG tablet Take 10 mg by mouth daily.     fluticasone (FLONASE) 50 MCG/ACT nasal spray Place 1 spray into both nostrils daily as needed for allergies.     GEMTESA 75 MG TABS Take 1 tablet by mouth daily.    hydroxychloroquine (PLAQUENIL) 200 MG tablet Take 238m by mouth twice daily Monday through Friday. None on Saturday or Sunday.    levothyroxine (SYNTHROID, LEVOTHROID) 125 MCG tablet Take 125 mcg by mouth daily before breakfast.    loperamide (IMODIUM) 2 MG capsule Take 1 capsule (2 mg total) by mouth 4 (four) times daily as needed for diarrhea or loose stools.    Multiple Vitamins-Minerals (PRESERVISION AREDS) CAPS Take 1 capsule by mouth daily.    NYSTATIN EX Apply topically as needed.    ondansetron (ZOFRAN-ODT) 4 MG disintegrating tablet Take 1 tablet (4 mg total) by mouth every 8 (eight) hours as needed.    oxybutynin (DITROPAN-XL) 10 MG 24 hr tablet Take 10 mg by mouth at bedtime. (Patient not taking: Reported on 10/17/2022)    pantoprazole (PROTONIX) 40 MG tablet Take 40 mg by mouth daily.    simethicone (MYLICON) 1213MG chewable tablet 125 mg daily as needed for flatulence.     topiramate (TOPAMAX) 50 MG tablet TAKE 3 TABLETS EVERY DAY    traMADol (ULTRAM) 50 MG tablet TAKE 1 TO 2 TABLETS TWICE DAILY AS NEEDED    TRIAMCINOLONE ACETONIDE EX Apply topically as needed.    valACYclovir (VALTREX) 500 MG tablet Take 500 mg by mouth daily as needed (fever blister).     vitamin B-12  (CYANOCOBALAMIN) 500 MCG tablet Take 5,000 mcg by mouth every other day. (Patient not taking: Reported on 08/16/2022)    No facility-administered encounter medications on file as of 10/25/2022.    PAST MEDICAL HISTORY: Past Medical History:  Diagnosis Date   Arthritis    OA AND PAIN RT KNEE   Autoimmune disease (HCC)    + ANA, and DS DNA--PT STATES SHE WAS TOLD SHE DOES NOT HAVE LUPUS AS PREVIOUSLY THOUGHT   Fibromyalgia    followed by Dr. DEstanislado Pandy  Hemorrhoids    Hyperlipidemia    Hypothyroidism    Kidney stone    Macular degeneration    BOTH EYES   PONV (postoperative nausea and vomiting)    Thyroid disease    Hypothyroidism    PAST SURGICAL HISTORY: Past Surgical History:  Procedure Laterality Date   ABDOMINAL HYSTERECTOMY  1989   [place]   KNEE ARTHROSCOPY  1994   Right Knee   KNEE SURGERY     NECK SURGERY  2003   TONSILLECTOMY  1953   TOTAL KNEE ARTHROPLASTY Right 03/02/2013   Procedure: RIGHT TOTAL KNEE ARTHROPLASTY;  Surgeon: Gearlean Alf, MD;  Location: [facility];  Service: Orthopedics;  Laterality: Right;   TOTAL KNEE ARTHROPLASTY Left 10/10/2020   Procedure: TOTAL KNEE ARTHROPLASTY;  Surgeon: Gaynelle Arabian, MD;  Location: [facility];  Service: Orthopedics;  Laterality: Left;  86mn    ALLERGIES: Allergies  Allergen Reactions   Codeine Nausea And Vomiting    NAUSEA & VOMITING   Hydrocodone Nausea And Vomiting    FAMILY HISTORY: Family History  Problem Relation Age of Onset   Stroke Mother    Hypertension Mother    Hyperlipidemia Mother    Cancer Mother        Lung   Alzheimer's disease Mother    COPD Mother    Stroke Father    Hypertension Father    Diabetes Father        Type II   Hyperlipidemia Father    Cancer Father        Lung   Arthritis Father        Rheumatoid   COPD Father    COPD Brother    Thyroid disease Daughter    Breast cancer Cousin     Breast cancer Cousin     SOCIAL HISTORY: Social History   Tobacco Use   Smoking status: Never    Passive exposure: Past   Smokeless tobacco: Never  Vaping Use   Vaping Use: Never used  Substance Use Topics   Alcohol use: Yes    Comment: occasional   Drug use: No   Social History   Social History Narrative   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  General:*** General appearance: Awake and alert. No distress. Cooperative with exam. Skin: No rash or jaundice. HEENT: Atraumatic. Anicteric. Lungs: Non-labored breathing on room air  Heart: Regular Abdomen: Soft, non tender. Extremities: No edema. No obvious deformity.  Musculoskeletal: No obvious joint swelling.  Neurological: Mental Status: Alert. Speech fluent. No pseudobulbar affect Cranial Nerves: CNII: No RAPD. Visual fields intact. CNIII, IV, VI: PERRL. No nystagmus. EOMI. CN V: Facial sensation intact bilaterally to fine touch. [place] clench strong. Jaw jerk***. CN VII: Facial muscles symmetric and strong. No ptosis at rest or after sustained upgaze***. CN VIII: Hears finger rub well bilaterally. CN IX: No hypophonia. CN X: Palate elevates symmetrically. CN XI: Full strength shoulder shrug bilaterally. CN XII: Tongue protrusion full and midline. No atrophy or fasciculations. No significant dysarthria*** Motor: Tone is ***. *** fasciculations in *** extremities. *** atrophy. No grip or percussive myotonia.  Individual muscle group testing (MRC grade out of 5):  Movement     Neck flexion ***    Neck extension ***     Right Left   Shoulder abduction *** ***   Shoulder adduction *** ***   Shoulder ext rotation *** ***   Shoulder int rotation *** ***   Elbow flexion *** ***   Elbow extension *** ***   Wrist extension *** ***   Wrist  flexion *** ***   Finger abduction - FDI *** ***   Finger abduction -  ADM *** ***   Finger extension *** ***   Finger distal flexion - 2/3 *** ***   Finger distal flexion - 4/5 *** ***   Thumb flexion - FPL *** ***   Thumb abduction - APB *** ***    Hip flexion *** ***   Hip extension *** ***   Hip adduction *** ***   Hip abduction *** ***   Knee extension *** ***   Knee flexion *** ***   Dorsiflexion *** ***   Plantarflexion *** ***   Inversion *** ***   Eversion *** ***   Great toe extension *** ***   Great toe flexion *** ***     Reflexes:  Right Left  Bicep *** ***  Tricep *** ***  BrRad *** ***  Knee *** ***  Ankle *** ***   Pathological Reflexes: Babinski: *** response bilaterally*** Hoffman: *** Troemner: *** Pectoral: *** Palmomental: *** Facial: *** Midline tap: *** Sensation: Pinprick: *** Vibration: *** Temperature: *** Proprioception: *** Coordination: Intact finger-to- nose-finger and heel-to-shin bilaterally. Romberg negative.*** Gait: Able to rise from chair with arms crossed unassisted. Normal, narrow-based gait. Able to tandem walk. Able to walk on toes and heels.***   Lab and Test Review: New results: 07/31/22: CK 36 B12: 1413 A1c: 5.7  09/14/22: Normal or unremarkable: SSA, SSB, ESR, C3/C4, CBC, CMP dsDNA: elevated to 67 ANA positive (1:80, nuclear, speckled)  EMG (09/24/22): NCV & EMG Findings: Extensive electrodiagnostic evaluation of the left lower limb with additional nerve conduction studies in the right lower limb shows: Left sural and superficial peroneal sensory responses are within normal limits. Bilateral peroneal (EDB) motor responses show reduced amplitude (L1.4, R1.8 mV). Left peroneal (TA) and bilateral tibial (AH) motor responses are within normal limits. H reflex is absent bilaterally. Chronic motor axon loss changes without accompanying active denervation changes are seen in the left extensor digitorum brevis muscle.  All other tested muscles are within normal limits with normal motor unit configuration and recruitment patterns.   Impression: This electrodiagnostic study shows no significant abnormalities. Specifically: No electrodiagnostic evidence of a large fiber polyneuropathy. No electrodiagnostic evidence of a left lumbosacral (L3-S1) motor radiculopathy. No electrodiagnostic evidence of myopathy. Chronic (no active) motor axon loss changes are seen isolated to the intrinsic foot muscles. This finding in isolation is of unclear clinical significance and can be seen as a result of chronic repetitive local trauma from footwear, among other possibilities. Reduced amplitude of bilateral peroneal (EDB) motor responses are likely due to #4 above vs technical in nature. Absent H reflex may be normal for age and in isolation are of unclear clinical significance. ***  Previously reviewed results: Internal labs: Normal or unremarkable: CBC, vit D, TSH, SSA, SSB  05/29/22: ANA: positive (1:80) dsDNA ab: elevated to 71 CMP remarkable for mildly elevated glucose (108)   Spine xray (03/07/2002): PORTABLE LATERAL VIEW OF THE CERVICAL SPINE - #1  AN INITIAL FILM SHOWS A NEEDLE MARKING THE ANTERIOR DISC SPACE AT C5-6.  PORTABLE LATERAL VIEW OF THE CERVICAL SPINE - #2  A SECOND FILM SHOWS ANTERIOR CERVICAL DISKECTOMY FROM C5 TO C7.  INTERBODY PLUGS APPEAR WELL  POSITIONED AND THERE IS AN ANTERIOR PLATE WITH FIXATION SCREWS WHICH APPEAR WELL POSITIONED.  IMPRESSION  ACDF C5 TO C7.  ASSESSMENT: This is Jacqualine Code, a 76 y.o. female with:  ***  Plan: ***  Return to clinic in ***  Total time spent reviewing records, interview, history/exam, documentation, and coordination of care on day of  encounter:  *** min  Kai Levins, MD

## 2022-10-25 ENCOUNTER — Ambulatory Visit: Payer: Medicare HMO | Admitting: Neurology

## 2022-10-26 ENCOUNTER — Other Ambulatory Visit: Payer: Self-pay | Admitting: Rheumatology

## 2022-10-26 NOTE — Telephone Encounter (Signed)
Next Visit: 02/20/2023  Last Visit: 10/17/2022  Last Fill: 06/01/2022  Current Dose per office note on 10/17/2022: not discussed  Okay to refill Topamax?

## 2022-10-31 ENCOUNTER — Ambulatory Visit: Payer: Medicare HMO | Admitting: Neurology

## 2022-11-07 DIAGNOSIS — R800 Isolated proteinuria: Secondary | ICD-10-CM | POA: Diagnosis not present

## 2022-11-07 DIAGNOSIS — R351 Nocturia: Secondary | ICD-10-CM | POA: Diagnosis not present

## 2022-11-07 DIAGNOSIS — R350 Frequency of micturition: Secondary | ICD-10-CM | POA: Diagnosis not present

## 2022-11-07 DIAGNOSIS — N3281 Overactive bladder: Secondary | ICD-10-CM | POA: Diagnosis not present

## 2022-11-07 DIAGNOSIS — N3941 Urge incontinence: Secondary | ICD-10-CM | POA: Diagnosis not present

## 2022-11-07 DIAGNOSIS — R3915 Urgency of urination: Secondary | ICD-10-CM | POA: Diagnosis not present

## 2022-11-07 DIAGNOSIS — B961 Klebsiella pneumoniae [K. pneumoniae] as the cause of diseases classified elsewhere: Secondary | ICD-10-CM | POA: Diagnosis not present

## 2022-11-07 DIAGNOSIS — N200 Calculus of kidney: Secondary | ICD-10-CM | POA: Diagnosis not present

## 2022-11-07 DIAGNOSIS — R82998 Other abnormal findings in urine: Secondary | ICD-10-CM | POA: Diagnosis not present

## 2022-11-07 DIAGNOSIS — N390 Urinary tract infection, site not specified: Secondary | ICD-10-CM | POA: Diagnosis not present

## 2022-11-13 DIAGNOSIS — L820 Inflamed seborrheic keratosis: Secondary | ICD-10-CM | POA: Diagnosis not present

## 2022-11-13 DIAGNOSIS — C44329 Squamous cell carcinoma of skin of other parts of face: Secondary | ICD-10-CM | POA: Diagnosis not present

## 2022-11-13 DIAGNOSIS — D485 Neoplasm of uncertain behavior of skin: Secondary | ICD-10-CM | POA: Diagnosis not present

## 2022-11-14 NOTE — Progress Notes (Signed)
NEUROLOGY FOLLOW UP OFFICE NOTE  Paula Murphy [id number]  Subjective:  Paula Murphy is a 77 y.o. 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  To briefly review: Patient's symptoms started about 1.5 years ago (2022) after a COVID vaccine. About 2 weeks later, her left thigh felt numb. She also couldn't walk well. She feels like the entire leg is weak. She had an achy sensation in her thigh for about 6 months before it resolved. She saw her orthopedic doctor thinking it could be related to her knee. The knee checked out though. Her rheumatologist saw her after thinking it could be her fibromyalgia. She then made the connection to the COVID shot and thought that must be what is causing symptoms. She now has numbness in thigh and into the lower leg has tingling on the outside of knee. The numbness and tingling is intermittent. She denies saddle anesthesia. She denies back pain, though she does have chronic pain from fibromyalgia (no change to these symptoms).    She is now dragging her left leg affecting her life. She is also having difficulty holding urine, especially when standing. She is on oxybutynin and seeing urology for this. She denies bowel incontinence. She has no symptoms in the right leg and no symptoms in her arms.    Patient saw pulmonology (Dr. Verlee Monte) on 07/12/22 for 2.5 years of dyspnea. Her primary complaint at that time though was LLE weakness which she felt started after a COVID vaccine. Per notes, she had dyspnea, could not hold urine, and had LLE weakness after the shot. Per exam, no focal weakness or gait abnormality was seen on 07/12/22. Patient was referred to neurology for further evaluation. Per patient, the SOB is related to dragging her left leg.    She is not on medications. She  went to PT x 2 for her leg that improved symptoms for the time she was getting it, but then all improvement went away after stopping therapy. She last went to therapy about 1.5 months ago (for about 2 total months). She does some of her home exercises.   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 She has never been treated.   She does not report any constitutional symptoms like fever, night sweats, anorexia or unintentional weight loss.   EtOH use: very rare  Restrictive diet? No Family history of neuropathy/myopathy/NM disease? No  Most recent Assessment and Plan (07/31/22): Overall, patient's symptoms are difficult to localize. Her symptoms are focal and not generalized as might be expected with a generalized myopathy or neuropathy or neuromuscular junction disorder. Her exam shows no weakness and reflexes are intact. Her sensory exam does not follow a nerve or nerve root distribution. There is no signs of cauda equina or conus medullaris syndrome on exam that would explain urinary incontinence. I will start with lab work and an EMG to attempt to further localize symptoms. I discussed with patient that her symptoms are not clearly neurologic, but that we would investigate further.    PLAN: -Blood work: CK, B12, HbA1c -EMG of LLE (add iliacus) -Recommended patient continue home exercises given by therapy -Recommended she continue follow up with rheumatology given positive dsDNA ab positivity and the question of SLE  Since  their last visit: CK, B12, and HbA1c were unremarkable. EMG on 09/24/22 showed no significant abnormalities.  Patient had COVID around Christmas, but has otherwise been well.  Patient saw rheumatology on 10/17/22 for undifferentiated connective tissue disease. Hydroxychloroquine has helped with fatigue and arthralgias. This was increased to 200 mg BID M-F.  In terms of her left leg, most days  are much better. She does still have some bad days, including when it is cold.  She has no new neurologic complaints today.  MEDICATIONS:  Outpatient Encounter Medications as of 11/21/2022  Medication Sig Note   amoxicillin (AMOXIL) 500 MG capsule Take 2,000 mg by mouth once. 09/28/2020: Dental precedure   Bisacodyl (LAXATIVE PO) Take 2 tablets by mouth daily. Vegetable    Cholecalciferol (VITAMIN D3) 5000 units CAPS Take 5,000 Units by mouth daily.     denosumab (PROLIA) 60 MG/ML SOSY injection Inject 60 mg into the skin every 6 (six) months. Courier to rheum: [address], [place], [place] [address]. Appt on 06/04/22    escitalopram (LEXAPRO) 10 MG tablet Take 20 mg by mouth daily.    fluticasone (FLONASE) 50 MCG/ACT nasal spray Place 1 spray into both nostrils daily as needed for allergies.     GEMTESA 75 MG TABS Take 1 tablet by mouth daily.    hydroxychloroquine (PLAQUENIL) 200 MG tablet Take '200mg'$  by mouth twice daily Monday through Friday. None on Saturday or Sunday.    levothyroxine (SYNTHROID, LEVOTHROID) 125 MCG tablet Take 125 mcg by mouth daily before breakfast.    loperamide (IMODIUM) 2 MG capsule Take 1 capsule (2 mg total) by mouth 4 (four) times daily as needed for diarrhea or loose stools.    Multiple Vitamins-Minerals (PRESERVISION AREDS) CAPS Take 1 capsule by mouth daily.    NYSTATIN EX Apply topically as needed.    ondansetron (ZOFRAN-ODT) 4 MG disintegrating tablet Take 1 tablet (4 mg total) by mouth every 8 (eight) hours as needed.    pantoprazole (PROTONIX) 40 MG tablet Take 40 mg by mouth daily.    simethicone (MYLICON) 657 MG chewable tablet 125 mg daily as needed for flatulence.     topiramate (TOPAMAX) 50 MG tablet TAKE 3 TABLETS EVERY DAY    traMADol (ULTRAM) 50 MG tablet TAKE 1 TO 2 TABLETS TWICE DAILY AS NEEDED    valACYclovir (VALTREX) 500 MG tablet Take 500 mg by mouth daily as needed (fever blister).     ezetimibe (ZETIA) 10 MG tablet Take 10 mg by  mouth daily.     oxybutynin (DITROPAN-XL) 10 MG 24 hr tablet Take 10 mg by mouth at bedtime. (Patient not taking: Reported on 10/17/2022)    TRIAMCINOLONE ACETONIDE EX Apply topically as needed. (Patient not taking: Reported on 11/21/2022)    vitamin B-12 (CYANOCOBALAMIN) 500 MCG tablet Take 5,000 mcg by mouth every other day. (Patient not taking: Reported on 08/16/2022)    No facility-administered encounter medications on file as of 11/21/2022.    PAST MEDICAL HISTORY: Past Medical History:  Diagnosis Date   Arthritis    OA AND PAIN RT KNEE   Autoimmune disease (HCC)    + ANA, and DS DNA--PT STATES SHE WAS TOLD SHE DOES NOT HAVE LUPUS AS PREVIOUSLY THOUGHT   Fibromyalgia    followed by Dr. Estanislado Pandy   Hemorrhoids    Hyperlipidemia    Hypothyroidism    Kidney stone    Macular degeneration    BOTH EYES   PONV (postoperative nausea and vomiting)  Thyroid disease    Hypothyroidism    PAST SURGICAL HISTORY: Past Surgical History:  Procedure Laterality Date   ABDOMINAL HYSTERECTOMY  1989   BUNIONECTOMY Left 2012   CHOLECYSTECTOMY  1996   EYE SURGERY     BILATERAL CATARACT EXTRACTION   KNEE ARTHROSCOPY  1994   Right Knee   KNEE SURGERY     NECK SURGERY  2003   TONSILLECTOMY  1953   TOTAL KNEE ARTHROPLASTY Right 03/02/2013   Procedure: RIGHT TOTAL KNEE ARTHROPLASTY;  Surgeon: Gearlean Alf, MD;  Location: [facility];  Service: Orthopedics;  Laterality: Right;   TOTAL KNEE ARTHROPLASTY Left 10/10/2020   Procedure: TOTAL KNEE ARTHROPLASTY;  Surgeon: Gaynelle Arabian, MD;  Location: [facility];  Service: Orthopedics;  Laterality: Left;  60mn    ALLERGIES: Allergies  Allergen Reactions   Codeine Nausea And Vomiting    NAUSEA & VOMITING   Hydrocodone Nausea And Vomiting    FAMILY HISTORY: Family History  Problem Relation Age of Onset   Stroke Mother    Hypertension Mother    Hyperlipidemia Mother    Cancer Mother        Lung   Alzheimer's disease Mother    COPD Mother     Stroke Father    Hypertension Father    Diabetes Father        Type II   Hyperlipidemia Father    Cancer Father        Lung   Arthritis Father        Rheumatoid   COPD Father    COPD Brother    Thyroid disease Daughter    Breast cancer Cousin    Breast cancer Cousin     SOCIAL HISTORY: Social History   Tobacco Use   Smoking status: Never    Passive exposure: Past   Smokeless tobacco: Never  Vaping Use   Vaping Use: Never used  Substance Use Topics   Alcohol use: Yes    Comment: occasional   Drug use: No   Social History   Social History Narrative   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  Patient appears well-groomed.   Head:  Normocephalic/atraumatic Neck: supple Extremities: Peripheral edema in left >> right lower extremity to the knee Neurological Exam: alert and oriented.  Speech fluent and not dysarthric, language intact.  CN II-XII intact. Bulk and tone normal, muscle strength 5/5 throughout.  Sensation to pinprick intact.  Deep tendon reflexes 1+ throughout.  Finger to nose testing intact.  Gait slow and cautious. Normal based.   Labs and Imaging review: New results: 07/31/22: A1c: 5.7 B12: 1413 CK: 36  09/14/22: Normal or unremarkable: SSA, SSB, ESR,  dsDNA elevated to 67 ANA positive (1:80)  EMG (09/24/22): NCV & EMG Findings: Extensive electrodiagnostic evaluation of the left lower limb with additional nerve conduction studies in the right lower limb shows: Left sural and superficial peroneal sensory responses are within normal limits. Bilateral peroneal (EDB) motor responses show reduced amplitude (L1.4, R1.8 mV). Left peroneal (TA) and bilateral tibial (AH) motor responses are within  normal limits. H reflex is absent bilaterally. Chronic motor  axon loss changes without accompanying active denervation changes are seen in the left extensor digitorum brevis muscle. All other tested muscles are within normal limits with normal motor unit configuration and recruitment patterns.   Impression: This electrodiagnostic study shows no significant abnormalities. Specifically: No electrodiagnostic evidence of a large fiber polyneuropathy. No electrodiagnostic evidence of a left lumbosacral (L3-S1) motor radiculopathy. No electrodiagnostic evidence of myopathy. Chronic (no active) motor axon loss changes are seen isolated to the intrinsic foot muscles. This finding in isolation is of unclear clinical significance and can be seen as a result of chronic repetitive local trauma from footwear, among other possibilities. Reduced amplitude of bilateral peroneal (EDB) motor responses are likely due to #4 above vs technical in nature. Absent H reflex may be normal for age and in isolation are of unclear clinical significance.   Previously reviewed results: Internal labs: Normal or unremarkable: CBC, vit D, TSH, SSA, SSB  05/29/22: ANA: positive (1:80) dsDNA ab: elevated to 71 CMP remarkable for mildly elevated glucose (108)   Spine xray (03/07/2002): PORTABLE LATERAL VIEW OF THE CERVICAL SPINE - #1  AN INITIAL FILM SHOWS A NEEDLE MARKING THE ANTERIOR DISC SPACE AT C5-6.  PORTABLE LATERAL VIEW OF THE CERVICAL SPINE - #2  A SECOND FILM SHOWS ANTERIOR CERVICAL DISKECTOMY FROM C5 TO C7.  INTERBODY PLUGS APPEAR WELL  POSITIONED AND THERE IS AN ANTERIOR PLATE WITH FIXATION SCREWS WHICH APPEAR WELL POSITIONED.  IMPRESSION  ACDF C5 TO C7.  Assessment/Plan:  This is Paula Murphy, a 77 y.o. female with left leg pain, swelling, and subjective weakness. Her neurologic examination is benign without concerning features and EMG without evidence of neuropathy, radiculopathy, or myopathy. She has  improved since prior visit (?increase in hydroxychloroquine). Her leg swelling/edema is of unclear origin, but combined with fibromyalgia and possibly her connective tissue disorder is likely the etiology of her symptoms.  Plan: -Continue home exercises given by PT and staying active  Return to clinic as needed  Total time spent reviewing records, interview, history/exam, documentation, and coordination of care on day of encounter:  25 min  Kai Levins, MD

## 2022-11-16 ENCOUNTER — Other Ambulatory Visit (HOSPITAL_COMMUNITY): Payer: Self-pay

## 2022-11-19 ENCOUNTER — Telehealth: Payer: Self-pay | Admitting: Rheumatology

## 2022-11-19 DIAGNOSIS — M810 Age-related osteoporosis without current pathological fracture: Secondary | ICD-10-CM

## 2022-11-19 NOTE — Telephone Encounter (Signed)
Patient states she received a call from the pharmacy that she needs a new prescription for her Prolia medication.  Patient states she received a grant in the past and would like to apply for that again.  Patient requested a return call.

## 2022-11-21 ENCOUNTER — Encounter: Payer: Self-pay | Admitting: Neurology

## 2022-11-21 ENCOUNTER — Ambulatory Visit: Payer: Medicare HMO | Admitting: Neurology

## 2022-11-21 VITALS — BP 130/89 | HR 78 | Ht 60.0 in | Wt 161.6 lb

## 2022-11-21 DIAGNOSIS — R202 Paresthesia of skin: Secondary | ICD-10-CM | POA: Diagnosis not present

## 2022-11-21 DIAGNOSIS — R200 Anesthesia of skin: Secondary | ICD-10-CM

## 2022-11-21 DIAGNOSIS — M359 Systemic involvement of connective tissue, unspecified: Secondary | ICD-10-CM

## 2022-11-21 DIAGNOSIS — R29898 Other symptoms and signs involving the musculoskeletal system: Secondary | ICD-10-CM

## 2022-11-21 NOTE — Telephone Encounter (Signed)
Unfortunately, no Osteoporosis grants have been available. Pharmacy team will continue to keep an eye if any open.

## 2022-11-26 ENCOUNTER — Other Ambulatory Visit (HOSPITAL_COMMUNITY): Payer: Self-pay

## 2022-11-26 MED ORDER — PROLIA 60 MG/ML ~~LOC~~ SOSY
60.0000 mg | PREFILLED_SYRINGE | SUBCUTANEOUS | 0 refills | Status: DC
  Filled 2022-11-26: qty 1, 180d supply, fill #0

## 2022-11-26 NOTE — Telephone Encounter (Signed)
Patient states that she will stop by on Wednesday 11/28/2022 for labs. She is comfortable with $95 copay. Rx sent to [facility] today to be couriered to clinic by 12/05/2022   Knox Saliva, PharmD, MPH, BCPS, CPP Clinical Pharmacist (Rheumatology and Pulmonology)

## 2022-11-26 NOTE — Telephone Encounter (Signed)
Per test claim, copay for Prolia through pharmacy benefit is $95. ATC patient to discuss - phone went straight to VM. Left VM requesting return call. Did advise in the voicemail that we need updated labs.  Knox Saliva, PharmD, MPH, BCPS, CPP Clinical Pharmacist (Rheumatology and Pulmonology)

## 2022-11-28 ENCOUNTER — Other Ambulatory Visit: Payer: Self-pay | Admitting: *Deleted

## 2022-11-28 DIAGNOSIS — G8929 Other chronic pain: Secondary | ICD-10-CM | POA: Diagnosis not present

## 2022-11-28 DIAGNOSIS — Z5181 Encounter for therapeutic drug level monitoring: Secondary | ICD-10-CM

## 2022-11-29 ENCOUNTER — Other Ambulatory Visit (HOSPITAL_COMMUNITY): Payer: Self-pay

## 2022-11-29 LAB — DM TEMPLATE

## 2022-11-29 NOTE — Progress Notes (Signed)
CBC and CMP are normal.

## 2022-11-30 LAB — DRUG MONITOR, TRAMADOL,QN, URINE
Desmethyltramadol: 1157 ng/mL — ABNORMAL HIGH (ref <100)
Tramadol: 1374 ng/mL — ABNORMAL HIGH (ref <100)

## 2022-11-30 LAB — CBC WITH DIFFERENTIAL/PLATELET
Absolute Monocytes: 442 cells/uL (ref 200–950)
Basophils Absolute: 47 cells/uL (ref 0–200)
Basophils Relative: 0.6 %
Eosinophils Absolute: 182 cells/uL (ref 15–500)
Eosinophils Relative: 2.3 %
HCT: 39.4 % (ref 35.0–45.0)
Hemoglobin: 12.8 g/dL (ref 11.7–15.5)
Lymphs Abs: 3239 cells/uL (ref 850–3900)
MCH: 31.4 pg (ref 27.0–33.0)
MCHC: 32.5 g/dL (ref 32.0–36.0)
MCV: 96.8 fL (ref 80.0–100.0)
MPV: 10.3 fL (ref 7.5–12.5)
Monocytes Relative: 5.6 %
Neutro Abs: 3990 cells/uL (ref 1500–7800)
Neutrophils Relative %: 50.5 %
Platelets: 232 10*3/uL (ref 140–400)
RBC: 4.07 10*6/uL (ref 3.80–5.10)
RDW: 12.6 % (ref 11.0–15.0)
Total Lymphocyte: 41 %
WBC: 7.9 10*3/uL (ref 3.8–10.8)

## 2022-11-30 LAB — COMPLETE METABOLIC PANEL WITH GFR
AG Ratio: 1.4 (calc) (ref 1.0–2.5)
ALT: 8 U/L (ref 6–29)
AST: 14 U/L (ref 10–35)
Albumin: 3.9 g/dL (ref 3.6–5.1)
Alkaline phosphatase (APISO): 49 U/L (ref 37–153)
BUN: 16 mg/dL (ref 7–25)
CO2: 25 mmol/L (ref 20–32)
Calcium: 8.9 mg/dL (ref 8.6–10.4)
Chloride: 106 mmol/L (ref 98–110)
Creat: 0.78 mg/dL (ref 0.60–1.00)
Globulin: 2.8 g/dL (calc) (ref 1.9–3.7)
Glucose, Bld: 90 mg/dL (ref 65–99)
Potassium: 3.8 mmol/L (ref 3.5–5.3)
Sodium: 140 mmol/L (ref 135–146)
Total Bilirubin: 0.3 mg/dL (ref 0.2–1.2)
Total Protein: 6.7 g/dL (ref 6.1–8.1)
eGFR: 79 mL/min/{1.73_m2} (ref >=60)

## 2022-11-30 LAB — DRUG MONITOR, PANEL 5, W/CONF, URINE
Amphetamines: NEGATIVE ng/mL (ref <500)
Barbiturates: NEGATIVE ng/mL (ref <300)
Benzodiazepines: NEGATIVE ng/mL (ref <100)
Cocaine Metabolite: NEGATIVE ng/mL (ref <150)
Creatinine: 166.5 mg/dL (ref >=20.0)
Marijuana Metabolite: NEGATIVE ng/mL (ref <20)
Methadone Metabolite: NEGATIVE ng/mL (ref <100)
Opiates: NEGATIVE ng/mL (ref <100)
Oxidant: NEGATIVE ug/mL (ref <200)
Oxycodone: NEGATIVE ng/mL (ref <100)
pH: 6.1 (ref 4.5–9.0)

## 2022-11-30 LAB — DM TEMPLATE

## 2022-11-30 NOTE — Progress Notes (Signed)
UDS consistent with tramadol use.

## 2022-12-03 NOTE — Telephone Encounter (Signed)
Left VM for patient requesting return call to schedule Prolia appt  Knox Saliva, PharmD, MPH, BCPS, CPP Clinical Pharmacist (Rheumatology and Pulmonology)

## 2022-12-03 NOTE — Telephone Encounter (Signed)
Returned call to pt. Phone went straight to VM. Patient can be scheduled on or after 12/05/2022. She does not need to speak with me directly to schedule appt.  Knox Saliva, PharmD, MPH, BCPS, CPP Clinical Pharmacist (Rheumatology and Pulmonology)

## 2022-12-04 NOTE — Progress Notes (Unsigned)
Pharmacy Note  Subjective:   Patient presents to clinic today to receive bi-annual dose of Prolia. Patient's last dose of Prolia was on 06/08/2022  Patient running a fever or have signs/symptoms of infection? {yes/no:20286}  Patient currently on antibiotics for the treatment of infection? {yes/no:20286}  Patient had fall in the last 6 months?  {yes/no:20286}  If yes, did it require medical attention? {yes/no:20286}   Patient taking calcium 1200 mg daily through diet or supplement and at least 800 units vitamin D? {yes/no:20286}  Objective: CMP     Component Value Date/Time   NA 140 11/28/2022 1455   K 3.8 11/28/2022 1455   CL 106 11/28/2022 1455   CO2 25 11/28/2022 1455   GLUCOSE 90 11/28/2022 1455   BUN 16 11/28/2022 1455   CREATININE 0.78 11/28/2022 1455   CALCIUM 8.9 11/28/2022 1455   PROT 6.7 11/28/2022 1455   ALBUMIN 3.7 11/30/2021 1133   AST 14 11/28/2022 1455   ALT 8 11/28/2022 1455   ALKPHOS 54 11/30/2021 1133   BILITOT 0.3 11/28/2022 1455   GFRNONAA 58 (L) 11/30/2021 1133   GFRNONAA 73 11/30/2020 1132   GFRAA 85 11/30/2020 1132    CBC    Component Value Date/Time   WBC 7.9 11/28/2022 1455   RBC 4.07 11/28/2022 1455   HGB 12.8 11/28/2022 1455   HCT 39.4 11/28/2022 1455   PLT 232 11/28/2022 1455   MCV 96.8 11/28/2022 1455   MCH 31.4 11/28/2022 1455   MCHC 32.5 11/28/2022 1455   RDW 12.6 11/28/2022 1455   LYMPHSABS 3,239 11/28/2022 1455   MONOABS 0.3 09/27/2009 2128   EOSABS 182 11/28/2022 1455   BASOSABS 47 11/28/2022 1455    Lab Results  Component Value Date   VD25OH 46 05/29/2022    T-score (12/25/2021): -1.9   Assessment/Plan:   Reviewed importance of adequate dietary intake of calcium in addition to supplementation due to risk of hypocalcemia with Prolia.   Patient tolerated injection  ***.   Patient is to return in 10-14 days for labs to monitor for hypocalcemia.  Future orders placed.  Patient's next Prolia dose is due on  12/03/2022.  Patient is due for updated DEXA in 12/2023.   All questions encouraged and answered.  Instructed patient to call with any further questions or concerns.

## 2022-12-05 ENCOUNTER — Ambulatory Visit: Payer: Medicare HMO | Attending: Rheumatology | Admitting: Pharmacist

## 2022-12-05 ENCOUNTER — Other Ambulatory Visit: Payer: Self-pay

## 2022-12-05 DIAGNOSIS — M810 Age-related osteoporosis without current pathological fracture: Secondary | ICD-10-CM

## 2022-12-05 DIAGNOSIS — Z7689 Persons encountering health services in other specified circumstances: Secondary | ICD-10-CM

## 2022-12-05 MED ORDER — DENOSUMAB 60 MG/ML ~~LOC~~ SOSY
60.0000 mg | PREFILLED_SYRINGE | Freq: Once | SUBCUTANEOUS | Status: AC
  Administered 2022-12-05: 60 mg via SUBCUTANEOUS

## 2022-12-05 MED ORDER — TRAMADOL HCL 50 MG PO TABS: ORAL_TABLET | ORAL | 0 refills | Status: DC

## 2022-12-05 NOTE — Telephone Encounter (Signed)
Patient requested a refill of tramadol while in the office today for prolia injection.   Next Visit: 02/20/2023  Last Visit: 10/17/2022  UDS:11/28/2022 c/w  Narc Agreement: 10/17/2022  Last Fill: 07/20/2022   Okay to refill tramadol?

## 2022-12-06 ENCOUNTER — Other Ambulatory Visit (HOSPITAL_COMMUNITY): Payer: Self-pay

## 2022-12-14 DIAGNOSIS — M810 Age-related osteoporosis without current pathological fracture: Secondary | ICD-10-CM | POA: Diagnosis not present

## 2022-12-14 DIAGNOSIS — I714 Abdominal aortic aneurysm, without rupture, unspecified: Secondary | ICD-10-CM | POA: Diagnosis not present

## 2022-12-14 DIAGNOSIS — M359 Systemic involvement of connective tissue, unspecified: Secondary | ICD-10-CM | POA: Diagnosis not present

## 2022-12-14 DIAGNOSIS — E782 Mixed hyperlipidemia: Secondary | ICD-10-CM | POA: Diagnosis not present

## 2022-12-14 DIAGNOSIS — M797 Fibromyalgia: Secondary | ICD-10-CM | POA: Diagnosis not present

## 2022-12-14 DIAGNOSIS — I700 Atherosclerosis of aorta: Secondary | ICD-10-CM | POA: Diagnosis not present

## 2022-12-14 DIAGNOSIS — F330 Major depressive disorder, recurrent, mild: Secondary | ICD-10-CM | POA: Diagnosis not present

## 2022-12-14 DIAGNOSIS — E038 Other specified hypothyroidism: Secondary | ICD-10-CM | POA: Diagnosis not present

## 2022-12-14 DIAGNOSIS — R32 Unspecified urinary incontinence: Secondary | ICD-10-CM | POA: Diagnosis not present

## 2022-12-21 ENCOUNTER — Other Ambulatory Visit (HOSPITAL_COMMUNITY): Payer: Self-pay

## 2023-01-01 DIAGNOSIS — C44329 Squamous cell carcinoma of skin of other parts of face: Secondary | ICD-10-CM | POA: Diagnosis not present

## 2023-01-04 HISTORY — PX: SQUAMOUS CELL CARCINOMA EXCISION: SHX2433

## 2023-01-07 ENCOUNTER — Other Ambulatory Visit: Payer: Self-pay | Admitting: Rheumatology

## 2023-01-07 NOTE — Telephone Encounter (Signed)
Next Visit: 02/20/2023   Last Visit: 10/17/2022   Last Fill: 10/26/2022  Current Dose per office note on 10/17/2022: not discussed   Okay to refill Topamax?

## 2023-01-08 ENCOUNTER — Other Ambulatory Visit: Payer: Self-pay | Admitting: Rheumatology

## 2023-01-08 DIAGNOSIS — L905 Scar conditions and fibrosis of skin: Secondary | ICD-10-CM | POA: Diagnosis not present

## 2023-01-09 NOTE — Telephone Encounter (Signed)
Next Visit: 02/20/2023  Last Visit: 10/17/2022  Labs: 11/28/2022 CBC and CMP are normal   Eye exam: 08/22/2022 WNL   Current Dose per office note 10/17/2022: hydroxychloroquine was increased to 200 mg p.o. twice daily Monday to Friday   DX:Age-related osteoporosis without current pathological fracture   Last Fill: 10/17/2022  Okay to refill Plaquenil?

## 2023-01-28 DIAGNOSIS — R5383 Other fatigue: Secondary | ICD-10-CM | POA: Diagnosis not present

## 2023-01-28 DIAGNOSIS — Z1152 Encounter for screening for COVID-19: Secondary | ICD-10-CM | POA: Diagnosis not present

## 2023-01-28 DIAGNOSIS — J0100 Acute maxillary sinusitis, unspecified: Secondary | ICD-10-CM | POA: Diagnosis not present

## 2023-01-28 DIAGNOSIS — R051 Acute cough: Secondary | ICD-10-CM | POA: Diagnosis not present

## 2023-01-28 DIAGNOSIS — J189 Pneumonia, unspecified organism: Secondary | ICD-10-CM | POA: Diagnosis not present

## 2023-02-02 ENCOUNTER — Other Ambulatory Visit: Payer: Self-pay | Admitting: Rheumatology

## 2023-02-04 NOTE — Telephone Encounter (Signed)
Last Fill: 12/05/2022  UDS:11/28/2022 c/w   Narc Agreement: 10/17/2022   Next Visit: 02/20/2023   Last Visit: 10/17/2022   Dx: Fibromyalgia   Current Dose per office note on 10/17/2022:tramadol 50 mg 2-4 tablets daily for pain management.    Okay to refill Tramadol?

## 2023-02-06 NOTE — Progress Notes (Addendum)
Office Visit Note  Patient: Paula Murphy             Date of Birth: 1946/04/11           MRN: [id number]             PCP: Adrian Prince, MD Referring: Adrian Prince, MD Visit Date: 02/20/2023 Occupation: @GUAROCC @  Subjective:   Left hip pain   History of Present Illness: Paula Murphy is a 77 y.o. female with history of undifferentiated connective tissue disease, osteoarthritis, degenerative disc disease and osteoporosis.  She states she traveled recently and had some fluid retention on her feet which improved gradually.  She continues to have pain and discomfort in her cervical region, in the trapezius area.  She complains of some discomfort in her bilateral knee joints which are replaced.  She has off-and-on discomfort in her hands and her feet.  She has been having discomfort in her left trochanteric bursa and would like it injected.  She has generalized pain and discomfort  from fibromyalgia for which she has been taking tramadol 1 to 2 tablets twice a day.She denies striae of oral ulcers, nasal ulcers, sicca symptoms, Raynaud's phenomenon, lymphadenopathy or photosensitivity.    Activities of Daily Living:  Patient reports morning stiffness for 0 minutes.   Patient Reports nocturnal pain.  Difficulty dressing/grooming: Denies Difficulty climbing stairs: Reports Difficulty getting out of chair: Denies Difficulty using hands for taps, buttons, cutlery, and/or writing: Reports  Review of Systems  Constitutional:  Positive for fatigue.  HENT:  Negative for mouth sores and mouth dryness.   Eyes:  Negative for dryness.  Respiratory:  Negative for shortness of breath.   Cardiovascular:  Positive for swelling in legs/feet. Negative for chest pain and palpitations.  Gastrointestinal:  Negative for blood in stool, constipation and diarrhea.  Endocrine: Negative for increased urination.  Genitourinary:  Negative for involuntary urination.  Musculoskeletal:  Positive for  myalgias, muscle weakness, muscle tenderness and myalgias. Negative for joint pain, gait problem, joint pain, joint swelling and morning stiffness.  Skin:  Negative for color change, rash, hair loss and sensitivity to sunlight.  Allergic/Immunologic: Negative for susceptible to infections.  Neurological:  Negative for dizziness and headaches.  Hematological:  Negative for swollen glands.  Psychiatric/Behavioral:  Negative for depressed mood and sleep disturbance. The patient is not nervous/anxious.     PMFS History:  Patient Active Problem List   Diagnosis Date Noted   Primary osteoarthritis of left knee 10/10/2020   Ingrown toenail 02/02/2019   Primary osteoarthritis of both feet 03/21/2017   History of macular degeneration 03/21/2017   Other fatigue 03/21/2017   Age-related osteoporosis without current pathological fracture 03/21/2017   Esophageal reflux 06/29/2013   Actinic keratosis 04/11/2013   OA (osteoarthritis) of knee 03/02/2013   DEPRESSION 07/24/2010   Major depressive disorder, single episode, unspecified 07/24/2010   UTI 07/04/2010   ABDOMINAL BLOATING 01/20/2010   Fibromyalgia 10/04/2009   INSOMNIA, CHRONIC 10/04/2009   Autoimmune disease 10/04/2009   Vitamin D deficiency 10/26/2008   HYPOTHYROIDISM 08/13/2008   HYPERLIPIDEMIA 08/13/2008   HYPERTENSION 08/13/2008   HEADACHE 08/13/2008   Muscle pain 08/12/2008    Past Medical History:  Diagnosis Date   Arthritis    OA AND PAIN RT KNEE   Autoimmune disease    + ANA, and DS DNA--PT STATES SHE WAS TOLD SHE DOES NOT HAVE LUPUS AS PREVIOUSLY THOUGHT   Fibromyalgia    followed by Dr. Corliss Skains   Hemorrhoids  Hyperlipidemia    Hypothyroidism    Kidney stone    Macular degeneration    BOTH EYES   PONV (postoperative nausea and vomiting)    Thyroid disease    Hypothyroidism    Family History  Problem Relation Age of Onset   Stroke Mother    Hypertension Mother    Hyperlipidemia Mother    Cancer Mother         Lung   Alzheimer's disease Mother    COPD Mother    Stroke Father    Hypertension Father    Diabetes Father        Type II   Hyperlipidemia Father    Cancer Father        Lung   Arthritis Father        Rheumatoid   COPD Father    COPD Brother    Thyroid disease Daughter    Breast cancer Cousin    Breast cancer Cousin    Past Surgical History:  Procedure Laterality Date   ABDOMINAL HYSTERECTOMY  11/06/1987   BUNIONECTOMY Left 11/05/2010   CHOLECYSTECTOMY  11/05/1994   EYE SURGERY     BILATERAL CATARACT EXTRACTION   KNEE ARTHROSCOPY  11/05/1992   Right Knee   KNEE SURGERY     NECK SURGERY  11/05/2001   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   on face   TONSILLECTOMY  11/06/1951   TOTAL KNEE ARTHROPLASTY Right 03/02/2013   Procedure: RIGHT TOTAL KNEE ARTHROPLASTY;  Surgeon: Loanne Drilling, MD;  Location: [facility];  Service: Orthopedics;  Laterality: Right;   TOTAL KNEE ARTHROPLASTY Left 10/10/2020   Procedure: TOTAL KNEE ARTHROPLASTY;  Surgeon: Ollen Gross, MD;  Location: [facility];  Service: Orthopedics;  Laterality: Left;    Social History   Social History Narrative   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  Constitutional:      Appearance: She is well-developed.  HENT:     Head: Normocephalic and atraumatic.  Eyes:      Conjunctiva/sclera: Conjunctivae normal.  Cardiovascular:     Rate and Rhythm: Normal rate and regular rhythm.     Heart sounds: Normal heart sounds.  Pulmonary:     Effort: Pulmonary effort is normal.     Breath sounds: Normal breath sounds.  Abdominal:     General: Bowel sounds are normal.     Palpations: Abdomen is soft.  Musculoskeletal:     Cervical back: Normal range of motion.  Lymphadenopathy:     Cervical: No cervical adenopathy.  Skin:    General: Skin is warm and dry.     Capillary Refill: Capillary refill takes less than 2 seconds.  Neurological:     Mental Status: She is alert and oriented to person, place, and time.  Psychiatric:        Behavior: Behavior normal.  Musculoskeletal Exam: She had limited lateral rotation of the cervical spine.  Shoulder joints, elbow joints, wrist joints were in good range of motion.  Bilateral PIP and DIP thickening was noted.  Hip joints were in good range of motion.  She had tenderness over left trochanteric bursa.  There was no warmth swelling or effusion in the knee joints.  No pedal edema was noted.  Bilateral lymphedema was noted.  There was no tenderness over ankles or MTPs.  CDAI Exam: CDAI Score: -- Patient Global: --; Provider Global: -- Swollen: --; Tender: -- Joint Exam 02/20/2023   No joint exam has been documented for this visit   There is currently no information documented on the homunculus. Go to the Rheumatology activity and complete the homunculus joint exam.  Investigation: No additional findings.  Imaging: No results found.  Recent Labs: Lab Results  Component Value Date   WBC 7.9 11/28/2022   HGB 12.8 11/28/2022   PLT 232 11/28/2022   NA 140 11/28/2022   K 3.8 11/28/2022   CL 106 11/28/2022   CO2 25 11/28/2022   GLUCOSE 90 11/28/2022   BUN 16 11/28/2022   CREATININE 0.78 11/28/2022   BILITOT 0.3 11/28/2022   ALKPHOS 54 11/30/2021   AST 14 11/28/2022   ALT 8 11/28/2022   PROT 6.7  11/28/2022   ALBUMIN 3.7 11/30/2021   CALCIUM 8.9 11/28/2022   GFRAA 85 11/30/2020    Speciality Comments: PLQ Eye Exam: 08/22/2022 WNL @ [facility] Follow up in 1 year  Prolia: 02/07/18, 08/19/18, 10/12/19, 04/11/20, 12/06/20, 06/04/21,12/06/2021, 06/08/22, 12/05/22  Procedures:  Large Joint Inj: L greater trochanter on 02/20/2023 3:33 PM Indications: pain Details: 27 G 1.5 in needle, lateral approach  Arthrogram: No  Medications: 40 mg triamcinolone acetonide 40 MG/ML; 1.5 mL lidocaine 1 % Aspirate: 0 mL Outcome: tolerated well, no immediate complications Procedure, treatment alternatives, risks and benefits explained, specific risks discussed. Consent was given by the patient. Immediately prior to procedure a time out was called to verify the correct patient, procedure, equipment, support staff and site/side marked as required. Patient was prepped and draped in the usual sterile fashion.     Allergies: Codeine and Hydrocodone   Assessment / Plan:     Visit Diagnoses: Undifferentiated connective tissue disease - positive ANA, positive double-stranded DNA, fatigue, arthralgias.  She has been on Plaquenil 200 mg p.o. twice daily Monday to Friday since October 2023. -She continues to have pain and discomfort in multiple joints.  No synovitis was noted.  Labs from September 14, 2022 were reviewed.  Double-stranded DNA was positive and stable.  Complements were normal and sed rate was normal.  I will check labs today.  Plan: Protein / creatinine ratio, urine, Anti-DNA antibody, double-stranded, C3 and C4, Sedimentation rate  High risk medication use - Plaquenil 200 mg p.o. twice daily Monday to Friday started October 2023. -Eye examination on August 22, 2022 was normal.  Plan: CBC with Differential/Platelet, COMPLETE METABOLIC PANEL WITH GFR  Primary osteoarthritis of both hands-she had bilateral PIP and DIP thickening.  No synovitis was noted.  Trochanteric bursitis of left hip-she had  tenderness over left trochanteric bursa.  Per patient's request left trochanteric bursa was injected with lidocaine and Kenalog as described above.  She tolerated the procedure well.  Postprocedure instructions were given.  Status post total bilateral knee replacement - Right total knee replacement February 03, 2013, left total knee replaced in October 10, 2000 Dr. Despina Hick.  She continues to have  some discomfort in her knee joints.  Primary osteoarthritis of both feet-she continues to have some groin discomfort.  DDD (degenerative disc disease), cervical - Status post ACDF C5-C7.  She had bilateral trapezius spasm.  Range of motion exercises were discussed.  Age-related osteoporosis without current pathological fracture - January 02, 2022 DEXA scan T score -1.9, BMD 0.642 AP spine 4% improvement.  She has been on Prolia since April 2019.  Last Prolia injection was on December 05, 2022.  Calcium rich diet and vitamin D was discussed.  Medication monitoring encounter-UDS was negative on November 28, 2022.    Vitamin D deficiency  Fibromyalgia-she continues to have generalized pain and discomfort.  She had positive tender points and hyperalgesia.  She is on tramadol 50 mg 1 to 2 tablets p.o. twice daily as needed.  Other fatigue-related to fibromyalgia.  Primary insomnia-good sleep hygiene was discussed.  Encounter for chronic pain management - Tramadol 50 mg 2 to 4 tablets daily for pain management.  UDS was checked on November 28, 2022.  Will check it again in July.  History of hypertension-blood pressure was 114/77.  History of hyperlipidemia  Lymphedema  History of anxiety  History of macular degeneration  History of depression  History of hypothyroidism  Orders: Orders Placed This Encounter  Procedures   Protein / creatinine ratio, urine   CBC with Differential/Platelet   COMPLETE METABOLIC PANEL WITH GFR   Anti-DNA antibody, double-stranded   C3 and C4   Sedimentation rate    No orders of the defined types were placed in this encounter.    Follow-Up Instructions: Return in about 5 months (around 07/23/2023) for UCTD.   Pollyann Savoy, MD  Note - This record has been created using Animal nutritionist.  Chart creation errors have been sought, but may not always  have been located. Such creation errors do not reflect on  the standard of medical care.

## 2023-02-20 ENCOUNTER — Ambulatory Visit: Payer: Medicare HMO | Attending: Rheumatology | Admitting: Rheumatology

## 2023-02-20 ENCOUNTER — Encounter: Payer: Self-pay | Admitting: Rheumatology

## 2023-02-20 VITALS — BP 114/77 | HR 74 | Resp 16 | Ht 62.0 in | Wt 166.0 lb

## 2023-02-20 DIAGNOSIS — M19041 Primary osteoarthritis, right hand: Secondary | ICD-10-CM

## 2023-02-20 DIAGNOSIS — M19072 Primary osteoarthritis, left ankle and foot: Secondary | ICD-10-CM

## 2023-02-20 DIAGNOSIS — M7062 Trochanteric bursitis, left hip: Secondary | ICD-10-CM | POA: Diagnosis not present

## 2023-02-20 DIAGNOSIS — Z79899 Other long term (current) drug therapy: Secondary | ICD-10-CM

## 2023-02-20 DIAGNOSIS — M359 Systemic involvement of connective tissue, unspecified: Secondary | ICD-10-CM

## 2023-02-20 DIAGNOSIS — Z8679 Personal history of other diseases of the circulatory system: Secondary | ICD-10-CM

## 2023-02-20 DIAGNOSIS — R5383 Other fatigue: Secondary | ICD-10-CM

## 2023-02-20 DIAGNOSIS — M810 Age-related osteoporosis without current pathological fracture: Secondary | ICD-10-CM | POA: Diagnosis not present

## 2023-02-20 DIAGNOSIS — F5101 Primary insomnia: Secondary | ICD-10-CM

## 2023-02-20 DIAGNOSIS — M5030 Other cervical disc degeneration, unspecified cervical region: Secondary | ICD-10-CM

## 2023-02-20 DIAGNOSIS — G8929 Other chronic pain: Secondary | ICD-10-CM

## 2023-02-20 DIAGNOSIS — Z8659 Personal history of other mental and behavioral disorders: Secondary | ICD-10-CM

## 2023-02-20 DIAGNOSIS — Z96653 Presence of artificial knee joint, bilateral: Secondary | ICD-10-CM | POA: Diagnosis not present

## 2023-02-20 DIAGNOSIS — Z8639 Personal history of other endocrine, nutritional and metabolic disease: Secondary | ICD-10-CM

## 2023-02-20 DIAGNOSIS — M19042 Primary osteoarthritis, left hand: Secondary | ICD-10-CM

## 2023-02-20 DIAGNOSIS — M19071 Primary osteoarthritis, right ankle and foot: Secondary | ICD-10-CM | POA: Diagnosis not present

## 2023-02-20 DIAGNOSIS — E559 Vitamin D deficiency, unspecified: Secondary | ICD-10-CM

## 2023-02-20 DIAGNOSIS — I890 Lymphedema, not elsewhere classified: Secondary | ICD-10-CM

## 2023-02-20 DIAGNOSIS — Z5181 Encounter for therapeutic drug level monitoring: Secondary | ICD-10-CM

## 2023-02-20 DIAGNOSIS — M797 Fibromyalgia: Secondary | ICD-10-CM

## 2023-02-20 DIAGNOSIS — Z8669 Personal history of other diseases of the nervous system and sense organs: Secondary | ICD-10-CM

## 2023-02-20 MED ORDER — LIDOCAINE HCL 1 % IJ SOLN
1.5000 mL | INTRAMUSCULAR | Status: AC | PRN
Start: 2023-02-20 — End: 2023-02-20
  Administered 2023-02-20: 1.5 mL

## 2023-02-20 MED ORDER — TRIAMCINOLONE ACETONIDE 40 MG/ML IJ SUSP
40.0000 mg | INTRAMUSCULAR | Status: AC | PRN
Start: 2023-02-20 — End: 2023-02-20
  Administered 2023-02-20: 40 mg via INTRA_ARTICULAR

## 2023-02-21 LAB — CBC WITH DIFFERENTIAL/PLATELET
Absolute Monocytes: 351 cells/uL (ref 200–950)
Basophils Absolute: 22 cells/uL (ref 0–200)
Basophils Relative: 0.4 %
Eosinophils Absolute: 167 cells/uL (ref 15–500)
Eosinophils Relative: 3.1 %
HCT: 37.4 % (ref 35.0–45.0)
Hemoglobin: 11.9 g/dL (ref 11.7–15.5)
Lymphs Abs: 2057 cells/uL (ref 850–3900)
MCH: 30.7 pg (ref 27.0–33.0)
MCHC: 31.8 g/dL — ABNORMAL LOW (ref 32.0–36.0)
MCV: 96.4 fL (ref 80.0–100.0)
MPV: 10.6 fL (ref 7.5–12.5)
Monocytes Relative: 6.5 %
Neutro Abs: 2803 cells/uL (ref 1500–7800)
Neutrophils Relative %: 51.9 %
Platelets: 239 10*3/uL (ref 140–400)
RBC: 3.88 10*6/uL (ref 3.80–5.10)
RDW: 11.9 % (ref 11.0–15.0)
Total Lymphocyte: 38.1 %
WBC: 5.4 10*3/uL (ref 3.8–10.8)

## 2023-02-21 LAB — COMPLETE METABOLIC PANEL WITH GFR
AG Ratio: 1.4 (calc) (ref 1.0–2.5)
ALT: 11 U/L (ref 6–29)
AST: 13 U/L (ref 10–35)
Albumin: 3.6 g/dL (ref 3.6–5.1)
Alkaline phosphatase (APISO): 44 U/L (ref 37–153)
BUN: 25 mg/dL (ref 7–25)
CO2: 25 mmol/L (ref 20–32)
Calcium: 8.8 mg/dL (ref 8.6–10.4)
Chloride: 110 mmol/L (ref 98–110)
Creat: 0.89 mg/dL (ref 0.60–1.00)
Globulin: 2.5 g/dL (calc) (ref 1.9–3.7)
Glucose, Bld: 94 mg/dL (ref 65–99)
Potassium: 4.1 mmol/L (ref 3.5–5.3)
Sodium: 142 mmol/L (ref 135–146)
Total Bilirubin: 0.3 mg/dL (ref 0.2–1.2)
Total Protein: 6.1 g/dL (ref 6.1–8.1)
eGFR: 67 mL/min/{1.73_m2} (ref >=60)

## 2023-02-21 LAB — C3 AND C4
C3 Complement: 122 mg/dL (ref 83–193)
C4 Complement: 21 mg/dL (ref 15–57)

## 2023-02-21 LAB — SEDIMENTATION RATE: Sed Rate: 6 mm/h (ref 0–30)

## 2023-02-21 LAB — ANTI-DNA ANTIBODY, DOUBLE-STRANDED: ds DNA Ab: 50 IU/mL — ABNORMAL HIGH

## 2023-02-28 DIAGNOSIS — J189 Pneumonia, unspecified organism: Secondary | ICD-10-CM | POA: Diagnosis not present

## 2023-03-15 ENCOUNTER — Other Ambulatory Visit: Payer: Self-pay | Admitting: Rheumatology

## 2023-03-18 NOTE — Telephone Encounter (Signed)
Last Fill: 02/04/2023  UDS:11/28/2022 UDS consistent with tramadol use.   Narc Agreement: 10/17/2022   Next Visit: 08/13/2023  Last Visit: 02/20/2023  Dx: Fibromyalgia-   Current Dose per office note on on 02/20/2023: tramadol 50 mg 1 to 2 tablets p.o. twice daily as needed.    Okay to refill tramadol?

## 2023-03-21 ENCOUNTER — Other Ambulatory Visit: Payer: Self-pay | Admitting: Rheumatology

## 2023-03-21 NOTE — Telephone Encounter (Signed)
Last Fill: 01/07/2023  Next Visit: 08/13/2023  Last Visit: 02/20/2023  Current Dose per office note on 02/20/2023: not discussed.   Okay to refill topamax?

## 2023-03-22 ENCOUNTER — Other Ambulatory Visit: Payer: Self-pay | Admitting: *Deleted

## 2023-03-22 NOTE — Telephone Encounter (Signed)
Please review lab results and advise. Patient requesting call regarding 02/20/2023 lab results.  Last Fill: 01/09/2023  Eye exam: 08/22/2022 normal   Labs: 02/20/2023 dsDNA 50, MCHC 31.8,   Next Visit: 08/13/2023  Last Visit: 02/20/2023  EA:[id number] connective tissue disease   Current Dose per office note 02/20/2023: Plaquenil 200 mg p.o. twice daily Monday to Friday   Okay to refill Plaquenil?

## 2023-03-24 MED ORDER — HYDROXYCHLOROQUINE SULFATE 200 MG PO TABS: ORAL_TABLET | ORAL | 0 refills | Status: DC

## 2023-03-25 ENCOUNTER — Telehealth: Payer: Self-pay | Admitting: Rheumatology

## 2023-03-25 NOTE — Telephone Encounter (Signed)
Patient advised ESR within normal limits, complements within normal limits, CMP within normal limits, CBC within normal limits, protein creatinine ratio not performed. Double-stranded DNA remains positive: 50: Improved--trending down. Labs are not consistent with a flare.

## 2023-03-25 NOTE — Telephone Encounter (Signed)
Last lab work I see was from 02/20/23.  Has she had more updated lab work?  Ok to refill plaquenil.

## 2023-03-25 NOTE — Telephone Encounter (Signed)
Patient left a voicemail requesting a return call with the results of her labwork.  Patient is also checking the status of her Plaquenil refill to be sent to [facility].

## 2023-03-25 NOTE — Telephone Encounter (Signed)
ESR within normal limits, complements within normal limits, CMP within normal limits, CBC within normal limits, protein creatinine ratio not performed.  Double-stranded DNA remains positive: 50: Improved--trending down.  Labs are not consistent with a flare.

## 2023-04-18 ENCOUNTER — Other Ambulatory Visit: Payer: Self-pay | Admitting: Rheumatology

## 2023-04-18 NOTE — Telephone Encounter (Signed)
Last Fill: 03/18/2023  UDS:11/28/2022 UDS consistent with tramadol use.   Narc Agreement: 11/28/2022  Next Visit: 08/13/2023  Last Visit: 02/20/2023  Dx: Fibromyalgia   Current Dose per office note on 02/20/2023: tramadol 50 mg 1 to 2 tablets p.o. twice daily as needed.    Okay to refill Tramadol?

## 2023-04-26 DIAGNOSIS — I700 Atherosclerosis of aorta: Secondary | ICD-10-CM | POA: Diagnosis not present

## 2023-04-26 DIAGNOSIS — M810 Age-related osteoporosis without current pathological fracture: Secondary | ICD-10-CM | POA: Diagnosis not present

## 2023-04-26 DIAGNOSIS — R269 Unspecified abnormalities of gait and mobility: Secondary | ICD-10-CM | POA: Diagnosis not present

## 2023-04-26 DIAGNOSIS — E559 Vitamin D deficiency, unspecified: Secondary | ICD-10-CM | POA: Diagnosis not present

## 2023-04-26 DIAGNOSIS — N200 Calculus of kidney: Secondary | ICD-10-CM | POA: Diagnosis not present

## 2023-04-26 DIAGNOSIS — M797 Fibromyalgia: Secondary | ICD-10-CM | POA: Diagnosis not present

## 2023-04-26 DIAGNOSIS — E538 Deficiency of other specified B group vitamins: Secondary | ICD-10-CM | POA: Diagnosis not present

## 2023-04-26 DIAGNOSIS — E038 Other specified hypothyroidism: Secondary | ICD-10-CM | POA: Diagnosis not present

## 2023-04-26 DIAGNOSIS — G8929 Other chronic pain: Secondary | ICD-10-CM | POA: Diagnosis not present

## 2023-04-26 DIAGNOSIS — E782 Mixed hyperlipidemia: Secondary | ICD-10-CM | POA: Diagnosis not present

## 2023-04-26 DIAGNOSIS — M546 Pain in thoracic spine: Secondary | ICD-10-CM | POA: Diagnosis not present

## 2023-05-02 ENCOUNTER — Other Ambulatory Visit: Payer: Self-pay | Admitting: Endocrinology

## 2023-05-02 DIAGNOSIS — M810 Age-related osteoporosis without current pathological fracture: Secondary | ICD-10-CM

## 2023-05-20 ENCOUNTER — Telehealth: Payer: Self-pay

## 2023-05-20 DIAGNOSIS — M7062 Trochanteric bursitis, left hip: Secondary | ICD-10-CM | POA: Diagnosis not present

## 2023-05-20 DIAGNOSIS — M7061 Trochanteric bursitis, right hip: Secondary | ICD-10-CM | POA: Diagnosis not present

## 2023-05-20 NOTE — Telephone Encounter (Signed)
Patient contacted the office to inquire when someone would call her to schedule her Prolia shot. Patient call back number is [phone]. Please advise.

## 2023-05-21 ENCOUNTER — Other Ambulatory Visit (HOSPITAL_COMMUNITY): Payer: Self-pay

## 2023-05-21 ENCOUNTER — Telehealth: Payer: Self-pay | Admitting: Pharmacist

## 2023-05-21 DIAGNOSIS — E559 Vitamin D deficiency, unspecified: Secondary | ICD-10-CM

## 2023-05-21 DIAGNOSIS — Z79899 Other long term (current) drug therapy: Secondary | ICD-10-CM

## 2023-05-21 DIAGNOSIS — M810 Age-related osteoporosis without current pathological fracture: Secondary | ICD-10-CM

## 2023-05-21 NOTE — Telephone Encounter (Signed)
Patient due for Prolia on 06/03/23.   Per test claim, copay through pharmacy benefit $381.01 (significant increase from last dose)  Through medical benefit, would require pre-certification. Once approved, Medicare would be cover 80% of the cost and patient would have 20% coinsurance.  Spoke with patient - she would like to receive at clinic.  She will stop by clinic next week for updated labs.Order for CMET and Vitamin D placed today.  Chesley Mires, PharmD, MPH, BCPS, CPP Clinical Pharmacist (Rheumatology and Pulmonology)

## 2023-05-23 DIAGNOSIS — Z85828 Personal history of other malignant neoplasm of skin: Secondary | ICD-10-CM | POA: Diagnosis not present

## 2023-05-23 DIAGNOSIS — L578 Other skin changes due to chronic exposure to nonionizing radiation: Secondary | ICD-10-CM | POA: Diagnosis not present

## 2023-05-23 DIAGNOSIS — D692 Other nonthrombocytopenic purpura: Secondary | ICD-10-CM | POA: Diagnosis not present

## 2023-05-23 DIAGNOSIS — L821 Other seborrheic keratosis: Secondary | ICD-10-CM | POA: Diagnosis not present

## 2023-05-23 DIAGNOSIS — Z859 Personal history of malignant neoplasm, unspecified: Secondary | ICD-10-CM | POA: Diagnosis not present

## 2023-05-23 DIAGNOSIS — L905 Scar conditions and fibrosis of skin: Secondary | ICD-10-CM | POA: Diagnosis not present

## 2023-05-23 DIAGNOSIS — I781 Nevus, non-neoplastic: Secondary | ICD-10-CM | POA: Diagnosis not present

## 2023-05-27 ENCOUNTER — Other Ambulatory Visit: Payer: Self-pay | Admitting: *Deleted

## 2023-05-27 DIAGNOSIS — Z79899 Other long term (current) drug therapy: Secondary | ICD-10-CM | POA: Diagnosis not present

## 2023-05-27 DIAGNOSIS — M810 Age-related osteoporosis without current pathological fracture: Secondary | ICD-10-CM | POA: Diagnosis not present

## 2023-05-27 DIAGNOSIS — E559 Vitamin D deficiency, unspecified: Secondary | ICD-10-CM | POA: Diagnosis not present

## 2023-05-28 ENCOUNTER — Other Ambulatory Visit (HOSPITAL_COMMUNITY): Payer: Self-pay

## 2023-05-28 ENCOUNTER — Other Ambulatory Visit: Payer: Self-pay

## 2023-05-28 LAB — COMPREHENSIVE METABOLIC PANEL
AG Ratio: 1.3 (calc) (ref 1.0–2.5)
ALT: 10 U/L (ref 6–29)
AST: 12 U/L (ref 10–35)
Albumin: 3.5 g/dL — ABNORMAL LOW (ref 3.6–5.1)
Alkaline phosphatase (APISO): 47 U/L (ref 37–153)
BUN: 18 mg/dL (ref 7–25)
CO2: 27 mmol/L (ref 20–32)
Calcium: 9.1 mg/dL (ref 8.6–10.4)
Chloride: 111 mmol/L — ABNORMAL HIGH (ref 98–110)
Creat: 0.87 mg/dL (ref 0.60–1.00)
Globulin: 2.6 g/dL (calc) (ref 1.9–3.7)
Glucose, Bld: 112 mg/dL — ABNORMAL HIGH (ref 65–99)
Potassium: 3.8 mmol/L (ref 3.5–5.3)
Sodium: 142 mmol/L (ref 135–146)
Total Bilirubin: 0.3 mg/dL (ref 0.2–1.2)
Total Protein: 6.1 g/dL (ref 6.1–8.1)

## 2023-05-28 LAB — VITAMIN D 25 HYDROXY (VIT D DEFICIENCY, FRACTURES): Vit D, 25-Hydroxy: 42 ng/mL (ref 30–100)

## 2023-05-28 MED ORDER — PROLIA 60 MG/ML ~~LOC~~ SOSY
60.0000 mg | PREFILLED_SYRINGE | SUBCUTANEOUS | 0 refills | Status: DC
  Filled 2023-05-28: qty 1, 180d supply, fill #0

## 2023-05-28 NOTE — Progress Notes (Signed)
Prolia appt scheduled for 06/04/2023. Rx sent to [facility] to be couriered to clinic  Chesley Mires, PharmD, MPH, BCPS, CPP Clinical Pharmacist (Rheumatology and Pulmonology)

## 2023-05-28 NOTE — Progress Notes (Signed)
CMP is stable.  Vitamin D is in the desirable range.

## 2023-05-28 NOTE — Telephone Encounter (Signed)
Labs wnl to proceed with Prolia. Rx sent to [facility]. Patient would like call regarding payment information (she is aware of copay).  Scheduled for Prolia appt on 06/04/2023  Chesley Mires, PharmD, MPH, BCPS, CPP Clinical Pharmacist (Rheumatology and Pulmonology)

## 2023-05-29 ENCOUNTER — Other Ambulatory Visit: Payer: Self-pay | Admitting: Endocrinology

## 2023-05-29 ENCOUNTER — Other Ambulatory Visit: Payer: Self-pay

## 2023-05-29 ENCOUNTER — Other Ambulatory Visit (HOSPITAL_COMMUNITY): Payer: Self-pay

## 2023-05-29 DIAGNOSIS — Z1231 Encounter for screening mammogram for malignant neoplasm of breast: Secondary | ICD-10-CM

## 2023-05-30 NOTE — Telephone Encounter (Signed)
Prolia received from [facility]. Placed in [place], PharmD, MPH, BCPS, CPP Clinical Pharmacist (Rheumatology and Pulmonology)

## 2023-06-03 ENCOUNTER — Other Ambulatory Visit (HOSPITAL_COMMUNITY): Payer: Self-pay

## 2023-06-04 ENCOUNTER — Other Ambulatory Visit (HOSPITAL_COMMUNITY): Payer: Self-pay

## 2023-06-04 ENCOUNTER — Ambulatory Visit: Payer: Medicare HMO | Attending: Rheumatology | Admitting: Pharmacist

## 2023-06-04 DIAGNOSIS — Z7689 Persons encountering health services in other specified circumstances: Secondary | ICD-10-CM

## 2023-06-04 DIAGNOSIS — M810 Age-related osteoporosis without current pathological fracture: Secondary | ICD-10-CM | POA: Diagnosis not present

## 2023-06-04 MED ORDER — DENOSUMAB 60 MG/ML ~~LOC~~ SOSY
60.0000 mg | PREFILLED_SYRINGE | Freq: Once | SUBCUTANEOUS | Status: AC
  Administered 2023-06-04: 60 mg via SUBCUTANEOUS

## 2023-06-04 NOTE — Progress Notes (Signed)
Pharmacy Note Subjective:   Patient presents to clinic today to receive bi-annual dose of Prolia. Her last dose of the Prolia was on 12/05/2022  Patient running a fever or have signs/symptoms of infection? No Patient currently on antibiotics for the treatment of infection? No Patient had fall in the last 6 months?  No  Patient taking calcium 1200 mg daily through diet or supplement and at least 800 units vitamin D? Vitamin D 5,000 units daily, not taking calcium due to have constipation (hasn't taken in years)  Objective: CMP     Component Value Date/Time   NA 142 05/27/2023 1307   K 3.8 05/27/2023 1307   CL 111 (H) 05/27/2023 1307   CO2 27 05/27/2023 1307   GLUCOSE 112 (H) 05/27/2023 1307   BUN 18 05/27/2023 1307   CREATININE 0.87 05/27/2023 1307   CALCIUM 9.1 05/27/2023 1307   PROT 6.1 05/27/2023 1307   ALBUMIN 3.7 11/30/2021 1133   AST 12 05/27/2023 1307   ALT 10 05/27/2023 1307   ALKPHOS 54 11/30/2021 1133   BILITOT 0.3 05/27/2023 1307   GFRNONAA 58 (L) 11/30/2021 1133   GFRNONAA 73 11/30/2020 1132   GFRAA 85 11/30/2020 1132    CBC    Component Value Date/Time   WBC 5.4 02/20/2023 1538   RBC 3.88 02/20/2023 1538   HGB 11.9 02/20/2023 1538   HCT 37.4 02/20/2023 1538   PLT 239 02/20/2023 1538   MCV 96.4 02/20/2023 1538   MCH 30.7 02/20/2023 1538   MCHC 31.8 (L) 02/20/2023 1538   RDW 11.9 02/20/2023 1538   LYMPHSABS 2,057 02/20/2023 1538   MONOABS 0.3 09/27/2009 2128   EOSABS 167 02/20/2023 1538   BASOSABS 22 02/20/2023 1538    Lab Results  Component Value Date   VD25OH 42 05/27/2023    T-score: January 02, 2022 DEXA scan T score -1.9, BMD 0.642 AP spine 4% improvement. She has been on Prolia since April 2019   Assessment/Plan:   Patient tolerated injection in left arm.   Administrations This Visit     denosumab (PROLIA) injection 60 mg     Admin Date 06/04/2023 Action Given Dose 60 mg Route Subcutaneous Documented By Murrell Redden, RPH-CPP            Prolia rx from [facility] Rx#: [id number] NDC: [id number] Lot: [id number] Exp: 04 Sep 2025   All questions encouraged and answered.  Instructed patient to call with any further questions or concerns.   Rickey Primus, PharmD Candidate [employer] Class of 2025  Chesley Mires, PharmD, MPH, BCPS, CPP Clinical Pharmacist (Rheumatology and Pulmonology)

## 2023-06-07 NOTE — Progress Notes (Deleted)
NEUROLOGY FOLLOW UP OFFICE NOTE  ALEATHEA PUGMIRE [id number]  Subjective:  Paula Murphy is a 77 y.o. 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  To briefly review: 07/31/22: Patient's symptoms started about 1.5 years ago (2022) after a COVID vaccine. About 2 weeks later, her left thigh felt numb. She also couldn't walk well. She feels like the entire leg is weak. She had an achy sensation in her thigh for about 6 months before it resolved. She saw her orthopedic doctor thinking it could be related to her knee. The knee checked out though. Her rheumatologist saw her after thinking it could be her fibromyalgia. She then made the connection to the COVID shot and thought that must be what is causing symptoms. She now has numbness in thigh and into the lower leg has tingling on the outside of knee. The numbness and tingling is intermittent. She denies saddle anesthesia. She denies back pain, though she does have chronic pain from fibromyalgia (no change to these symptoms).    She is now dragging her left leg affecting her life. She is also having difficulty holding urine, especially when standing. She is on oxybutynin and seeing urology for this. She denies bowel incontinence. She has no symptoms in the right leg and no symptoms in her arms.    Patient saw pulmonology (Dr. Thora Lance) on 07/12/22 for 2.5 years of dyspnea. Her primary complaint at that time though was LLE weakness which she felt started after a COVID vaccine. Per notes, she had dyspnea, could not hold urine, and had LLE weakness after the shot. Per exam, no focal weakness or gait abnormality was seen on 07/12/22. Patient was referred to neurology for further evaluation. Per patient, the SOB is related to dragging her left leg.    She is not on  medications. She went to PT x 2 for her leg that improved symptoms for the time she was getting it, but then all improvement went away after stopping therapy. She last went to therapy about 1.5 months ago (for about 2 total months). She does some of her home exercises.   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 She has never been treated.   She does not report any constitutional symptoms like fever, night sweats, anorexia or unintentional weight loss.   EtOH use: very rare  Restrictive diet? No Family history of neuropathy/myopathy/NM disease? No  11/21/22: CK, B12, and HbA1c were unremarkable. EMG on 09/24/22 showed no significant abnormalities.   Patient had COVID around Christmas, but has otherwise been well.   Patient saw rheumatology on 10/17/22 for undifferentiated connective tissue disease. Hydroxychloroquine has helped with fatigue and arthralgias. This was increased to 200 mg BID M-F.   In terms of her left leg, most days are much better. She does still have some bad days, including when it is cold.   She has no new neurologic complaints today.  Most recent Assessment and Plan (11/21/22): This is Paula Murphy, a 77 y.o. female with left leg pain, swelling, and subjective weakness. Her neurologic examination is benign without concerning features and EMG without evidence of neuropathy, radiculopathy, or myopathy. She has improved since prior visit (?increase in hydroxychloroquine). Her leg swelling/edema is of unclear origin, but  combined with fibromyalgia and possibly her connective tissue disorder is likely the etiology of her symptoms.   Plan: -Continue home exercises given by PT and staying active Return to clinic as needed  Since their last visit: ***  MEDICATIONS:  Outpatient Encounter Medications as of 06/14/2023  Medication Sig Note   amoxicillin (AMOXIL) 500 MG capsule Take 2,000 mg by  mouth once. 09/28/2020: Dental precedure   Bisacodyl (LAXATIVE PO) Take 2 tablets by mouth daily. Vegetable    Cholecalciferol (VITAMIN D3) 5000 units CAPS Take 5,000 Units by mouth daily.     denosumab (PROLIA) 60 MG/ML SOSY injection Inject 60 mg into the skin every 6 (six) months. CALL PT FOR PAYMENT INFORMATION. Courier to rheum: [address], [address], [place] [address]. Appt on 06/04/2023    escitalopram (LEXAPRO) 10 MG tablet Take 20 mg by mouth daily.    ezetimibe (ZETIA) 10 MG tablet Take 10 mg by mouth daily.     fluticasone (FLONASE) 50 MCG/ACT nasal spray Place 1 spray into both nostrils daily as needed for allergies.     GEMTESA 75 MG TABS Take 1 tablet by mouth daily.    hydroxychloroquine (PLAQUENIL) 200 MG tablet TAKE 1 tablet (200 mg) by mouth twice daily Monday thru Friday    levothyroxine (SYNTHROID) 150 MCG tablet Take 150 mcg by mouth daily before breakfast.    levothyroxine (SYNTHROID, LEVOTHROID) 125 MCG tablet Take 125 mcg by mouth daily before breakfast. (Patient not taking: Reported on 02/20/2023)    loperamide (IMODIUM) 2 MG capsule Take 1 capsule (2 mg total) by mouth 4 (four) times daily as needed for diarrhea or loose stools.    Multiple Vitamins-Minerals (PRESERVISION AREDS) CAPS Take 1 capsule by mouth daily.    NYSTATIN EX Apply topically as needed.    ondansetron (ZOFRAN-ODT) 4 MG disintegrating tablet Take 1 tablet (4 mg total) by mouth every 8 (eight) hours as needed.    oxybutynin (DITROPAN-XL) 10 MG 24 hr tablet Take 10 mg by mouth at bedtime. (Patient not taking: Reported on 10/17/2022)    pantoprazole (PROTONIX) 40 MG tablet Take 40 mg by mouth daily.    simethicone (MYLICON) 125 MG chewable tablet 125 mg daily as needed for flatulence.     topiramate (TOPAMAX) 50 MG tablet TAKE 3 TABLETS EVERY DAY    traMADol (ULTRAM) 50 MG tablet TAKE 1 TO 2 TABLETS TWICE DAILY AS NEEDED    TRIAMCINOLONE ACETONIDE EX Apply topically as needed.    valACYclovir  (VALTREX) 500 MG tablet Take 500 mg by mouth daily as needed (fever blister).     vitamin B-12 (CYANOCOBALAMIN) 500 MCG tablet Take 5,000 mcg by mouth every other day. (Patient not taking: Reported on 02/20/2023)    No facility-administered encounter medications on file as of 06/14/2023.    PAST MEDICAL HISTORY: Past Medical History:  Diagnosis Date   Arthritis    OA AND PAIN RT KNEE   Autoimmune disease (HCC)    + ANA, and DS DNA--PT STATES SHE WAS TOLD SHE DOES NOT HAVE LUPUS AS PREVIOUSLY THOUGHT   Fibromyalgia    followed by Dr. 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   on face   TONSILLECTOMY  11/06/1951   TOTAL KNEE ARTHROPLASTY Right 03/02/2013   Procedure: RIGHT TOTAL KNEE ARTHROPLASTY;  Surgeon: Loanne Drilling, MD;  Location: [facility];  Service: Orthopedics;  Laterality: Right;   TOTAL KNEE ARTHROPLASTY Left 10/10/2020   Procedure: TOTAL KNEE ARTHROPLASTY;  Surgeon: Ollen Gross, MD;  Location: [facility];  Service: Orthopedics;  Laterality: Left;     ALLERGIES: Allergies  Allergen Reactions   Codeine Nausea And Vomiting    NAUSEA & VOMITING   Hydrocodone Nausea And Vomiting    FAMILY HISTORY: Family History  Problem Relation Age of Onset   Stroke Mother    Hypertension Mother    Hyperlipidemia Mother    Cancer Mother        Lung   Alzheimer's disease Mother    COPD Mother    Stroke Father    Hypertension Father    Diabetes Father        Type  II   Hyperlipidemia Father    Cancer Father        Lung   Arthritis Father        Rheumatoid   COPD Father    COPD Brother    Thyroid disease Daughter    Breast cancer Cousin    Breast cancer Cousin     SOCIAL HISTORY: Social History   Tobacco Use   Smoking status: Never    Passive exposure: Past   Smokeless tobacco: Never  Vaping Use   Vaping status: Never Used  Substance Use Topics   Alcohol use: Yes    Comment: occasional   Drug use: No   Social History   Social History Narrative   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  ***  Labs and Imaging review: New results: ***  Previously reviewed results: 07/31/22: A1c: 5.7 B12: 1413 CK: 36   09/14/22: Normal or unremarkable: SSA, SSB, ESR,  dsDNA elevated to 67 ANA positive (1:80)  Normal or unremarkable: CBC, vit D, TSH, SSA, SSB  05/29/22: ANA: positive (1:80) dsDNA ab: elevated to 71 CMP remarkable for mildly elevated glucose (108)   Spine xray (03/07/2002): PORTABLE LATERAL VIEW OF THE CERVICAL SPINE - #1  AN INITIAL FILM SHOWS A NEEDLE MARKING THE ANTERIOR DISC SPACE AT C5-6.  PORTABLE LATERAL VIEW OF THE CERVICAL SPINE - #2  A SECOND FILM SHOWS ANTERIOR CERVICAL DISKECTOMY FROM C5 TO C7.  INTERBODY PLUGS APPEAR WELL  POSITIONED AND THERE IS AN ANTERIOR PLATE WITH FIXATION SCREWS WHICH APPEAR WELL POSITIONED.  IMPRESSION  ACDF C5 TO C7.  EMG (09/24/22): NCV & EMG Findings: Extensive electrodiagnostic evaluation of the left lower limb with additional nerve conduction studies in the right lower limb shows: Left sural and superficial peroneal sensory responses are within normal limits. Bilateral peroneal (EDB) motor responses show reduced amplitude (L1.4, R1.8 mV). Left peroneal (TA) and bilateral tibial (AH) motor responses are  within normal limits. H reflex is absent bilaterally. Chronic motor axon loss changes without  accompanying active denervation changes are seen in the left extensor digitorum brevis muscle. All other tested muscles are within normal limits with normal motor unit configuration and recruitment patterns.   Impression: This electrodiagnostic study shows no significant abnormalities. Specifically: No electrodiagnostic evidence of a large fiber polyneuropathy. No electrodiagnostic evidence of a left lumbosacral (L3-S1) motor radiculopathy. No electrodiagnostic evidence of myopathy. Chronic (no active) motor axon loss changes are seen isolated to the intrinsic foot muscles. This finding in isolation is of unclear clinical significance and can be seen as a result of chronic repetitive local trauma from footwear, among other possibilities. Reduced amplitude of bilateral peroneal (EDB) motor responses are likely due to #4 above vs technical in nature. Absent H reflex may be normal for age and in isolation are of unclear clinical significance.  Assessment/Plan:  This is Paula Murphy, a 77 y.o. female with: ***   Plan: ***  Return to clinic in ***  Total time spent reviewing records, interview, history/exam, documentation, and coordination of care on day of encounter:  *** min  Jacquelyne Balint, MD

## 2023-06-10 ENCOUNTER — Encounter: Payer: Self-pay | Admitting: Endocrinology

## 2023-06-13 ENCOUNTER — Ambulatory Visit
Admission: RE | Admit: 2023-06-13 | Discharge: 2023-06-13 | Disposition: A | Discharge status: Home / Self Care | Payer: Medicare HMO | Source: Ambulatory Visit | Attending: Endocrinology | Admitting: Endocrinology

## 2023-06-13 DIAGNOSIS — M810 Age-related osteoporosis without current pathological fracture: Secondary | ICD-10-CM

## 2023-06-13 DIAGNOSIS — M47814 Spondylosis without myelopathy or radiculopathy, thoracic region: Secondary | ICD-10-CM | POA: Diagnosis not present

## 2023-06-13 MED ORDER — GADOPICLENOL 0.5 MMOL/ML IV SOLN
7.0000 mL | Freq: Once | INTRAVENOUS | Status: AC | PRN
  Administered 2023-06-13: 7 mL via INTRAVENOUS

## 2023-06-13 NOTE — Progress Notes (Signed)
NEUROLOGY FOLLOW UP OFFICE NOTE  Paula Murphy [id number]  Subjective:  Paula Murphy is a 77 y.o. 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  To briefly review: 07/31/22: Patient's symptoms started about 1.5 years ago (2022) after a COVID vaccine. About 2 weeks later, her left thigh felt numb. She also couldn't walk well. She feels like the entire leg is weak. She had an achy sensation in her thigh for about 6 months before it resolved. She saw her orthopedic doctor thinking it could be related to her knee. The knee checked out though. Her rheumatologist saw her after thinking it could be her fibromyalgia. She then made the connection to the COVID shot and thought that must be what is causing symptoms. She now has numbness in thigh and into the lower leg has tingling on the outside of knee. The numbness and tingling is intermittent. She denies saddle anesthesia. She denies back pain, though she does have chronic pain from fibromyalgia (no change to these symptoms).    She is now dragging her left leg affecting her life. She is also having difficulty holding urine, especially when standing. She is on oxybutynin and seeing urology for this. She denies bowel incontinence. She has no symptoms in the right leg and no symptoms in her arms.    Patient saw pulmonology (Dr. Thora Lance) on 07/12/22 for 2.5 years of dyspnea. Her primary complaint at that time though was LLE weakness which she felt started after a COVID vaccine. Per notes, she had dyspnea, could not hold urine, and had LLE weakness after the shot. Per exam, no focal weakness or gait abnormality was seen on 07/12/22. Patient was referred to neurology for further evaluation. Per patient, the SOB is related to dragging her left leg.    She is not on  medications. She went to PT x 2 for her leg that improved symptoms for the time she was getting it, but then all improvement went away after stopping therapy. She last went to therapy about 1.5 months ago (for about 2 total months). She does some of her home exercises.   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 She has never been treated.   She does not report any constitutional symptoms like fever, night sweats, anorexia or unintentional weight loss.   EtOH use: very rare  Restrictive diet? No Family history of neuropathy/myopathy/NM disease? No  11/21/22: CK, B12, and HbA1c were unremarkable. EMG on 09/24/22 showed no significant abnormalities.   Patient had COVID around Christmas, but has otherwise been well.   Patient saw rheumatology on 10/17/22 for undifferentiated connective tissue disease. Hydroxychloroquine has helped with fatigue and arthralgias. This was increased to 200 mg BID M-F.   In terms of her left leg, most days are much better. She does still have some bad days, including when it is cold.   She has no new neurologic complaints today.  Most recent Assessment and Plan (11/21/22): This is Paula Murphy, a 77 y.o. female with left leg pain, swelling, and subjective weakness. Her neurologic examination is benign without concerning features and EMG without evidence of neuropathy, radiculopathy, or myopathy. She has improved since prior visit (?increase in hydroxychloroquine). Her leg swelling/edema is of unclear origin, but  combined with fibromyalgia and possibly her connective tissue disorder is likely the etiology of her symptoms.   Plan: -Continue home exercises given by PT and staying active Return to clinic as needed  Since their last visit: Patient is having difficulty walking and picking up her feet. She feels like the left side is worse than her right. She had an MRI of her thoracic  spine yesterday that has not been read by radiology yet, but does not appear to show any clear abnormalities.  Patient is on plaquenil for SLE, but symptoms started well before this medication.  She gets tired very easily and is very fatigued. She denies pain.  PCP questions the need for central imaging or lumbar spine imaging so sent patient back to neurology.  MEDICATIONS:  Outpatient Encounter Medications as of 06/14/2023  Medication Sig Note   Bisacodyl (LAXATIVE PO) Take 1 tablet by mouth daily. Vegetable    Cholecalciferol (VITAMIN D3) 5000 units CAPS Take 5,000 Units by mouth daily.     denosumab (PROLIA) 60 MG/ML SOSY injection Inject 60 mg into the skin every 6 (six) months. CALL PT FOR PAYMENT INFORMATION. Courier to rheum: [address], [address], [place] [address]. Appt on 06/04/2023    escitalopram (LEXAPRO) 10 MG tablet Take 20 mg by mouth daily.    ezetimibe (ZETIA) 10 MG tablet Take 10 mg by mouth daily.     fluticasone (FLONASE) 50 MCG/ACT nasal spray Place 1 spray into both nostrils daily as needed for allergies.     GEMTESA 75 MG TABS Take 1 tablet by mouth daily.    hydroxychloroquine (PLAQUENIL) 200 MG tablet TAKE 1 tablet (200 mg) by mouth twice daily Monday thru Friday    levothyroxine (SYNTHROID) 150 MCG tablet Take 150 mcg by mouth daily before breakfast.    loperamide (IMODIUM) 2 MG capsule Take 1 capsule (2 mg total) by mouth 4 (four) times daily as needed for diarrhea or loose stools.    Multiple Vitamins-Minerals (PRESERVISION AREDS) CAPS Take 1 capsule by mouth daily.    NYSTATIN EX Apply topically as needed.    ondansetron (ZOFRAN-ODT) 4 MG disintegrating tablet Take 1 tablet (4 mg total) by mouth every 8 (eight) hours as needed.    pantoprazole (PROTONIX) 40 MG tablet Take 40 mg by mouth daily.    simethicone (MYLICON) 125 MG chewable tablet 125 mg daily as needed for flatulence.     topiramate (TOPAMAX) 50 MG tablet TAKE 3 TABLETS EVERY DAY    traMADol  (ULTRAM) 50 MG tablet TAKE 1 TO 2 TABLETS TWICE DAILY AS NEEDED    valACYclovir (VALTREX) 500 MG tablet Take 500 mg by mouth daily as needed (fever blister).     amoxicillin (AMOXIL) 500 MG capsule Take 2,000 mg by mouth once. (Patient not taking: Reported on 06/14/2023) 09/28/2020: Dental precedure   levothyroxine (SYNTHROID, LEVOTHROID) 125 MCG tablet Take 125 mcg by mouth daily before breakfast. (Patient not taking: Reported on 02/20/2023)    oxybutynin (DITROPAN-XL) 10 MG 24 hr tablet Take 10 mg by mouth at bedtime. (Patient not taking: Reported on 10/17/2022)    TRIAMCINOLONE ACETONIDE EX Apply topically as needed. (Patient not taking: Reported on 06/14/2023)    vitamin B-12 (CYANOCOBALAMIN) 500 MCG tablet Take 5,000 mcg by mouth every other day. (Patient not taking: Reported on 02/20/2023)    No facility-administered encounter medications on file as of 06/14/2023.    PAST MEDICAL HISTORY: Past Medical History:  Diagnosis Date   Arthritis  OA AND PAIN RT KNEE   Autoimmune disease (HCC)    + ANA, and DS DNA--PT STATES SHE WAS TOLD SHE DOES NOT HAVE LUPUS AS PREVIOUSLY THOUGHT   Fibromyalgia    followed by Dr. 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 face   TONSILLECTOMY  11/06/1951   TOTAL KNEE ARTHROPLASTY Right 03/02/2013   Procedure: RIGHT TOTAL KNEE ARTHROPLASTY;  Surgeon: Loanne Drilling, MD;  Location: [facility];  Service: Orthopedics;  Laterality: Right;   TOTAL  KNEE ARTHROPLASTY Left 10/10/2020   Procedure: TOTAL KNEE ARTHROPLASTY;  Surgeon: Ollen Gross, MD;  Location: [facility];  Service: Orthopedics;  Laterality: Left;     ALLERGIES: Allergies  Allergen Reactions   Codeine Nausea And Vomiting    NAUSEA & VOMITING   Hydrocodone Nausea And Vomiting    FAMILY HISTORY: Family History  Problem Relation Age of Onset   Stroke Mother    Hypertension Mother    Hyperlipidemia Mother    Cancer Mother        Lung   Alzheimer's disease Mother    COPD Mother    Stroke Father    Hypertension Father    Diabetes Father        Type II   Hyperlipidemia Father    Cancer Father        Lung   Arthritis Father        Rheumatoid   COPD Father    COPD Brother    Thyroid disease Daughter    Breast cancer Cousin    Breast cancer Cousin     SOCIAL HISTORY: Social History   Tobacco Use   Smoking status: Never    Passive exposure: Past   Smokeless tobacco: Never  Vaping Use   Vaping status: Never Used  Substance Use Topics   Alcohol use: Yes    Comment: rarely   Drug use: No   Social History   Social History Narrative   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  Patient appears well-groomed.   Head:  Normocephalic/atraumatic Eyes:  fundi examined but not visualized Neck: supple, no paraspinal tenderness, full range of motion Back: No paraspinal tenderness Heart: regular rate and rhythm Lungs: Clear to auscultation bilaterally. Vascular: No carotid bruits.  Neurological Exam: Mental status: alert and oriented, speech fluent and not dysarthric, language intact.  Cranial nerves: CN I: not tested CN II: pupils equal, round and reactive to light, visual fields intact CN III, IV,  VI:  full range of  motion, no nystagmus, left eyelid lower set than right; does not fluctuate, has been present since at least first clinic visit (07/31/22) CN V: facial sensation intact. CN VII: upper and lower face symmetric CN VIII: hearing intact CN IX, X: uvula midline CN XI: sternocleidomastoid and trapezius muscles intact CN XII: tongue midline  Bulk & Tone: normal, no fasciculations. Motor:  muscle strength 5/5 throughout Deep Tendon Reflexes:  2+ throughout   Sensation:  Pinprick sensation intact. Finger to nose testing:  Without dysmetria.   Heel to shin:  Without dysmetria.   Gait:  Normal station and stride.  Romberg negative.   Labs and Imaging review: New results: 05/27/23: CMP unremarkable Vit D wnl  02/20/23: ESR wnl C3 and C4 wnl dsDNA elevated to 50  MRI thoracic spine w/wo contrast (06/14/23): Pending radiology read. No obvious abnormalities on my read.  Previously reviewed results: 07/31/22: A1c: 5.7 B12: 1413 CK: 36   09/14/22: Normal or unremarkable: SSA, SSB, ESR,  dsDNA elevated to 67 ANA positive (1:80)  Normal or unremarkable: CBC, vit D, TSH, SSA, SSB  05/29/22: ANA: positive (1:80) dsDNA ab: elevated to 71 CMP remarkable for mildly elevated glucose (108)   Spine xray (03/07/2002): PORTABLE LATERAL VIEW OF THE CERVICAL SPINE - #1  AN INITIAL FILM SHOWS A NEEDLE MARKING THE ANTERIOR DISC SPACE AT C5-6.  PORTABLE LATERAL VIEW OF THE CERVICAL SPINE - #2  A SECOND FILM SHOWS ANTERIOR CERVICAL DISKECTOMY FROM C5 TO C7.  INTERBODY PLUGS APPEAR WELL  POSITIONED AND THERE IS AN ANTERIOR PLATE WITH FIXATION SCREWS WHICH APPEAR WELL POSITIONED.  IMPRESSION  ACDF C5 TO C7.  EMG (09/24/22): NCV & EMG Findings: Extensive electrodiagnostic evaluation of the left lower limb with additional nerve conduction studies in the right lower limb shows: Left sural and superficial peroneal sensory responses are within normal limits. Bilateral peroneal (EDB) motor  responses show reduced amplitude (L1.4, R1.8 mV). Left peroneal (TA) and bilateral tibial (AH) motor responses are within normal limits. H reflex is absent bilaterally. Chronic motor axon loss changes without accompanying active denervation changes are seen in the left extensor digitorum brevis muscle. All other tested muscles are within normal limits with normal motor unit configuration and recruitment patterns.   Impression: This electrodiagnostic study shows no significant abnormalities. Specifically: No electrodiagnostic evidence of a large fiber polyneuropathy. No electrodiagnostic evidence of a left lumbosacral (L3-S1) motor radiculopathy. No electrodiagnostic evidence of myopathy. Chronic (no active) motor axon loss changes are seen isolated to the intrinsic foot muscles. This finding in isolation is of unclear clinical significance and can be seen as a result of chronic repetitive local trauma from footwear, among other possibilities. Reduced amplitude of bilateral peroneal (EDB) motor responses are likely due to #4 above vs technical in nature. Absent H reflex may be normal for age and in isolation are of unclear clinical significance.  Assessment/Plan:  This is Paula Murphy, a 77 y.o. female with subjective left leg weakness, difficulty walking, and fatigue. She denies any pain. Her work up to date has been significant for dsDNA positivity (SLE). Her EMG of the left leg was normal with no evidence of peripheral etiology (nerve or muscle), including lumbar radiculopathy. The etiology of symptoms is unclear. A central etiology (brain) is possible, though clear neurologic deficits are not seen today. Fibromyalgia and SLE could be potential explanations for abnormal fatigue resulting in deconditioning, but it would be unusual to be felt so focally (left leg). We discussed MRI brain as a final  test to work up a neurologic etiology, but patient has other things including another MRI to pay for  and would like to hold off for now. EMG did not show any evidence of radiculopathy and patient has no pain, so MRI lumbar spine would likely be low yield.   Plan: -Discussed MRI brain wo contrast. Patient currently has a lot of financial responsibilities and would like to wait on this. She will call when ready for me to order. -Encouraged patient to stay active -Follow up with rheumatology and PCP as planned -Fall precautions discussed  Return to clinic as needed  Total time spent reviewing records, interview, history/exam, documentation, and coordination of care on day of encounter:  50 min  Jacquelyne Balint, MD

## 2023-06-14 ENCOUNTER — Ambulatory Visit: Payer: Medicare HMO | Admitting: Neurology

## 2023-06-14 ENCOUNTER — Encounter: Payer: Self-pay | Admitting: Neurology

## 2023-06-14 VITALS — BP 139/81 | HR 90 | Ht 60.0 in | Wt 158.0 lb

## 2023-06-14 DIAGNOSIS — M329 Systemic lupus erythematosus, unspecified: Secondary | ICD-10-CM | POA: Diagnosis not present

## 2023-06-14 DIAGNOSIS — M797 Fibromyalgia: Secondary | ICD-10-CM

## 2023-06-14 DIAGNOSIS — R269 Unspecified abnormalities of gait and mobility: Secondary | ICD-10-CM | POA: Diagnosis not present

## 2023-06-14 DIAGNOSIS — R29898 Other symptoms and signs involving the musculoskeletal system: Secondary | ICD-10-CM | POA: Diagnosis not present

## 2023-06-14 NOTE — Patient Instructions (Addendum)
We discussed getting an MRI of your brain today to make sure this was not the cause of your symptoms. You would like to think about this and have other responsibilities currently.  Please call if/when you are ready for me to order the MRI.  The physicians and staff at [facility] are committed to providing excellent care. You may receive a survey requesting feedback about your experience at our office. We strive to receive "very good" responses to the survey questions. If you feel that your experience would prevent you from giving the office a "very good " response, please contact our office to try to remedy the situation. We may be reached at [phone]. Thank you for taking the time out of your busy day to complete the survey.  Jacquelyne Balint, MD [facility]  Preventing Falls at [facility] are common, often dreaded events in the lives of older people. Aside from the obvious injuries and even death that may result, fall can cause wide-ranging consequences including loss of independence, mental decline, decreased activity and mobility. Younger people are also at risk of falling, especially those with chronic illnesses and fatigue.  Ways to reduce risk for falling Examine diet and medications. Warm foods and alcohol dilate blood vessels, which can lead to dizziness when standing. Sleep aids, antidepressants and pain medications can also increase the likelihood of a fall.  Get a vision exam. Poor vision, cataracts and glaucoma increase the chances of falling.  Check foot gear. Shoes should fit snugly and have a sturdy, nonskid sole and a broad, low heel  Participate in a physician-approved exercise program to build and maintain muscle strength and improve balance and coordination. Programs that use ankle weights or stretch bands are excellent for muscle-strengthening. Water aerobics programs and low-impact Tai Chi programs have also been shown to improve balance and  coordination.  Increase vitamin D intake. Vitamin D improves muscle strength and increases the amount of calcium the body is able to absorb and deposit in bones.  How to prevent falls from common hazards Floors - Remove all loose wires, cords, and throw rugs. Minimize clutter. Make sure rugs are anchored and smooth. Keep furniture in its usual place.  Chairs -- Use chairs with straight backs, armrests and firm seats. Add firm cushions to existing pieces to add height.  Bathroom - Install grab bars and non-skid tape in the tub or shower. Use a bathtub transfer bench or a shower chair with a back support Use an elevated toilet seat and/or safety rails to assist standing from a low surface. Do not use towel racks or bathroom tissue holders to help you stand.  Lighting - Make sure halls, stairways, and entrances are well-lit. Install a night light in your bathroom or hallway. Make sure there is a light switch at the top and bottom of the staircase. Turn lights on if you get up in the middle of the night. Make sure lamps or light switches are within reach of the bed if you have to get up during the night.  Kitchen - Install non-skid rubber mats near the sink and stove. Clean spills immediately. Store frequently used utensils, pots, pans between waist and eye level. This helps prevent reaching and bending. Sit when getting things out of lower cupboards.  Living room/ Bedrooms - Place furniture with wide spaces in between, giving enough room to move around. Establish a route through the living room that gives you something to hold onto as you walk.  Stairs -  Make sure treads, rails, and rugs are secure. Install a rail on both sides of the stairs. If stairs are a threat, it might be helpful to arrange most of your activities on the lower level to reduce the number of times you must climb the stairs.  Entrances and doorways - Install metal handles on the walls adjacent to the doorknobs of all doors to make  it more secure as you travel through the doorway.  Tips for maintaining balance Keep at least one hand free at all times. Try using a backpack or fanny pack to hold things rather than carrying them in your hands. Never carry objects in both hands when walking as this interferes with keeping your balance.  Attempt to swing both arms from front to back while walking. This might require a conscious effort if Parkinson's disease has diminished your movement. It will, however, help you to maintain balance and posture, and reduce fatigue.  Consciously lift your feet off of the ground when walking. Shuffling and dragging of the feet is a common culprit in losing your balance.  When trying to navigate turns, use a "U" technique of facing forward and making a wide turn, rather than pivoting sharply.  Try to stand with your feet shoulder-length apart. When your feet are close together for any length of time, you increase your risk of losing your balance and falling.  Do one thing at a time. Don't try to walk and accomplish another task, such as reading or looking around. The decrease in your automatic reflexes complicates motor function, so the less distraction, the better.  Do not wear rubber or gripping soled shoes, they might "catch" on the floor and cause tripping.  Move slowly when changing positions. Use deliberate, concentrated movements and, if needed, use a grab bar or walking aid. Count 15 seconds between each movement. For example, when rising from a seated position, wait 15 seconds after standing to begin walking.  If balance is a continuous problem, you might want to consider a walking aid such as a cane, walking stick, or walker. Once you've mastered walking with help, you might be ready to try it on your own again.

## 2023-06-17 ENCOUNTER — Other Ambulatory Visit: Payer: Self-pay | Admitting: Rheumatology

## 2023-06-17 DIAGNOSIS — N3941 Urge incontinence: Secondary | ICD-10-CM | POA: Diagnosis not present

## 2023-06-17 DIAGNOSIS — N393 Stress incontinence (female) (male): Secondary | ICD-10-CM | POA: Diagnosis not present

## 2023-06-17 DIAGNOSIS — N200 Calculus of kidney: Secondary | ICD-10-CM | POA: Diagnosis not present

## 2023-06-17 NOTE — Telephone Encounter (Signed)
Last Fill: 03/24/2023  Eye exam: 08/22/2022 WNL    Labs: 02/20/2023 MCHC 31.8  Next Visit: 08/13/2023  Last Visit: 02/20/2023  DX: Undifferentiated connective tissue disease   Current Dose per office note 02/20/2023: Plaquenil 200 mg p.o. twice daily Monday to Friday   Okay to refill Plaquenil?

## 2023-06-25 ENCOUNTER — Ambulatory Visit
Admission: RE | Admit: 2023-06-25 | Discharge: 2023-06-25 | Disposition: A | Discharge status: Home / Self Care | Payer: Medicare HMO | Source: Ambulatory Visit | Attending: Endocrinology | Admitting: Endocrinology

## 2023-06-25 DIAGNOSIS — Z1231 Encounter for screening mammogram for malignant neoplasm of breast: Secondary | ICD-10-CM | POA: Diagnosis not present

## 2023-07-01 ENCOUNTER — Other Ambulatory Visit: Payer: Self-pay

## 2023-07-01 ENCOUNTER — Telehealth: Payer: Self-pay | Admitting: Neurology

## 2023-07-01 DIAGNOSIS — R200 Anesthesia of skin: Secondary | ICD-10-CM

## 2023-07-01 DIAGNOSIS — R29898 Other symptoms and signs involving the musculoskeletal system: Secondary | ICD-10-CM

## 2023-07-01 DIAGNOSIS — M329 Systemic lupus erythematosus, unspecified: Secondary | ICD-10-CM

## 2023-07-01 DIAGNOSIS — R269 Unspecified abnormalities of gait and mobility: Secondary | ICD-10-CM

## 2023-07-01 NOTE — Telephone Encounter (Signed)
Patient states that she is ready for the MRI order to be sent .

## 2023-07-10 ENCOUNTER — Encounter: Payer: Self-pay | Admitting: Neurology

## 2023-07-12 ENCOUNTER — Ambulatory Visit
Admission: RE | Admit: 2023-07-12 | Discharge: 2023-07-12 | Disposition: A | Discharge status: Home / Self Care | Payer: Medicare HMO | Source: Ambulatory Visit | Attending: Neurology | Admitting: Neurology

## 2023-07-12 DIAGNOSIS — R200 Anesthesia of skin: Secondary | ICD-10-CM | POA: Diagnosis not present

## 2023-07-12 DIAGNOSIS — R269 Unspecified abnormalities of gait and mobility: Secondary | ICD-10-CM | POA: Diagnosis not present

## 2023-07-12 DIAGNOSIS — M329 Systemic lupus erythematosus, unspecified: Secondary | ICD-10-CM

## 2023-07-12 DIAGNOSIS — R202 Paresthesia of skin: Secondary | ICD-10-CM | POA: Diagnosis not present

## 2023-07-12 DIAGNOSIS — R29898 Other symptoms and signs involving the musculoskeletal system: Secondary | ICD-10-CM

## 2023-07-16 ENCOUNTER — Telehealth: Payer: Self-pay | Admitting: Neurology

## 2023-07-16 NOTE — Telephone Encounter (Signed)
Pt called in and left a message. Wants to see if MRI results are back yet?

## 2023-07-16 NOTE — Telephone Encounter (Signed)
Called patient and unable to leave a message as phone kept ringing and hung up.   Need to inform patient that MRI results are not in yet and we will contact her as soon as they become available.

## 2023-07-23 ENCOUNTER — Other Ambulatory Visit: Payer: Self-pay | Admitting: Rheumatology

## 2023-07-23 NOTE — Telephone Encounter (Signed)
Last Fill: 04/18/2023  UDS:11/28/2022 UDS consistent with tramadol use.   Narc Agreement: 10/17/2022  Next Visit: 08/13/2023  Last Visit: 02/20/2023  Dx: Fibromyalgia   Current Dose per office note on 02/20/2023:tramadol 50 mg 1 to 2 tablets p.o. twice daily as needed.    Okay to refill Tramadol?

## 2023-07-23 NOTE — Telephone Encounter (Signed)
Pt called in wanting to see if her MRI results were back yet?

## 2023-07-24 ENCOUNTER — Telehealth: Payer: Self-pay | Admitting: Neurology

## 2023-07-24 ENCOUNTER — Encounter: Payer: Self-pay | Admitting: Neurology

## 2023-07-24 NOTE — Telephone Encounter (Signed)
Patient is calling to get MRI results, patient received them on [facility] but she doesn't know how to use it. Patient would like to have phone call today since she is out of town

## 2023-07-24 NOTE — Telephone Encounter (Signed)
Called patient and informed her of MRI results see Mychart message). Patient verbalized understanding and had no further questions or concerns.

## 2023-07-24 NOTE — Telephone Encounter (Signed)
[facility] and asked that MRI be read. They will get that done today.

## 2023-07-30 NOTE — Progress Notes (Deleted)
Office Visit Note  Patient: Paula Murphy             Date of Birth: 23-Nov-1945           MRN: [id number]             PCP: Adrian Prince, MD Referring: Adrian Prince, MD Visit Date: 08/13/2023 Occupation: @GUAROCC @  Subjective:  No chief complaint on file.   History of Present Illness: Paula Murphy is a 77 y.o. female ***     Activities of Daily Living:  Patient reports morning stiffness for *** {minute/hour:19697}.   Patient {ACTIONS;DENIES/REPORTS:21021675::"Denies"} nocturnal pain.  Difficulty dressing/grooming: {ACTIONS;DENIES/REPORTS:21021675::"Denies"} Difficulty climbing stairs: {ACTIONS;DENIES/REPORTS:21021675::"Denies"} Difficulty getting out of chair: {ACTIONS;DENIES/REPORTS:21021675::"Denies"} Difficulty using hands for taps, buttons, cutlery, and/or writing: {ACTIONS;DENIES/REPORTS:21021675::"Denies"}  No Rheumatology ROS completed.   PMFS History:  Patient Active Problem List   Diagnosis Date Noted   Primary osteoarthritis of left knee 10/10/2020   Ingrown toenail 02/02/2019   Primary osteoarthritis of both feet 03/21/2017   History of macular degeneration 03/21/2017   Other fatigue 03/21/2017   Age-related osteoporosis without current pathological fracture 03/21/2017   Esophageal reflux 06/29/2013   Actinic keratosis 04/11/2013   OA (osteoarthritis) of knee 03/02/2013   DEPRESSION 07/24/2010   Major depressive disorder, single episode, unspecified 07/24/2010   UTI 07/04/2010   ABDOMINAL BLOATING 01/20/2010   Fibromyalgia 10/04/2009   INSOMNIA, CHRONIC 10/04/2009   Autoimmune disease (HCC) 10/04/2009   Vitamin D deficiency 10/26/2008   HYPOTHYROIDISM 08/13/2008   HYPERLIPIDEMIA 08/13/2008   HYPERTENSION 08/13/2008   HEADACHE 08/13/2008   Muscle pain 08/12/2008    Past Medical History:  Diagnosis Date   Arthritis    OA AND PAIN RT KNEE   Autoimmune disease (HCC)    + ANA, and DS DNA--PT STATES SHE WAS TOLD SHE DOES NOT HAVE LUPUS AS  PREVIOUSLY THOUGHT   Fibromyalgia    followed by Dr. Corliss Skains   Hemorrhoids    Hyperlipidemia    Hypothyroidism    Kidney stone    Macular degeneration    BOTH EYES   PONV (postoperative nausea and vomiting)    Thyroid disease    Hypothyroidism    Family History  Problem Relation Age of Onset   Stroke Mother    Hypertension Mother    Hyperlipidemia Mother    Cancer Mother        Lung   Alzheimer's disease Mother    COPD Mother    Stroke Father    Hypertension Father    Diabetes Father        Type II   Hyperlipidemia Father    Cancer Father        Lung   Arthritis Father        Rheumatoid   COPD Father    COPD Brother    Thyroid disease Daughter    Breast cancer Cousin    Breast cancer Cousin    Past Surgical History:  Procedure Laterality Date   ABDOMINAL HYSTERECTOMY  11/06/1987   BUNIONECTOMY Left 11/05/2010   CHOLECYSTECTOMY  11/05/1994   EYE SURGERY     BILATERAL CATARACT EXTRACTION   KNEE ARTHROSCOPY  11/05/1992   Right Knee   KNEE SURGERY     NECK SURGERY  11/05/2001   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   on face   TONSILLECTOMY  11/06/1951   TOTAL KNEE ARTHROPLASTY Right 03/02/2013   Procedure: RIGHT TOTAL KNEE ARTHROPLASTY;  Surgeon: Loanne Drilling, MD;  Location: [facility];  Service: Orthopedics;  Laterality: Right;   TOTAL KNEE ARTHROPLASTY Left 10/10/2020   Procedure: TOTAL KNEE ARTHROPLASTY;  Surgeon: Ollen Gross, MD;  Location: [facility];  Service: Orthopedics;  Laterality: Left;    Social History   Social History Narrative   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   Physical Exam   Musculoskeletal Exam: ***  CDAI Exam: CDAI Score: -- Patient Global: --; Provider Global: -- Swollen: --; Tender: -- Joint Exam 08/13/2023   No joint exam has been documented for this visit   There is currently no information documented on the homunculus. Go to the Rheumatology activity and complete the homunculus joint exam.  Investigation: No additional findings.  Imaging: MR BRAIN WO CONTRAST  Result Date: 07/24/2023 CLINICAL DATA:  77 year old female with left leg numbness and tingling since February of 2021. Abnormal gait. EXAM: MRI HEAD WITHOUT CONTRAST TECHNIQUE: Multiplanar, multiecho pulse sequences of the brain and surrounding structures were obtained without intravenous contrast. COMPARISON:  None Available. FINDINGS: Brain: No restricted diffusion to suggest acute infarction. No midline shift, mass effect, evidence of mass lesion, ventriculomegaly, extra-axial collection or acute intracranial hemorrhage. Cervicomedullary junction and pituitary are within normal limits. Overall cerebral volume appears within normal limits for age. No regional disproportionate brain atrophy is evident. No cortical encephalomalacia identified. Moderate Patchy and confluent bilateral cerebral white matter T2 and FLAIR hyperintensity. No chronic cerebral blood products identified on SWI. Deep gray nuclei appear normal for age. Mild T2 heterogeneity in the brainstem, primarily the pons. Cerebellum appears normal for age. Vascular: Major intracranial vascular flow voids are preserved. Skull and upper cervical spine: Negative visible cervical spine. Visualized bone marrow signal is within normal limits. Sinuses/Orbits: Chronic appearing sphenoid sinus disease on the left. Other paranasal sinuses appear well aerated. Chronic postoperative changes to both globes. Otherwise negative orbits. Other:  Mastoids are well aerated. Grossly normal visible internal auditory structures. Negative visible scalp and face. IMPRESSION: 1. No acute intracranial abnormality. 2. Moderate signal changes in the cerebral white matter and pons, most commonly due to chronic small vessel disease. 3. Chronic appearing left sphenoid sinus disease. Electronically Signed   By: Odessa Fleming M.D.   On: 07/24/2023 10:08    Recent Labs: Lab Results  Component Value Date   WBC 5.4 02/20/2023   HGB 11.9 02/20/2023   PLT 239 02/20/2023   NA 142 05/27/2023   K 3.8 05/27/2023   CL 111 (H) 05/27/2023   CO2 27 05/27/2023   GLUCOSE 112 (H) 05/27/2023   BUN 18 05/27/2023   CREATININE 0.87 05/27/2023   BILITOT 0.3 05/27/2023   ALKPHOS 54 11/30/2021   AST 12 05/27/2023   ALT 10 05/27/2023   PROT 6.1 05/27/2023   ALBUMIN 3.7 11/30/2021   CALCIUM 9.1 05/27/2023   GFRAA 85 11/30/2020    Speciality Comments: PLQ Eye Exam: 08/22/2022 WNL @ [facility] Follow up in 1 year  Prolia: 02/07/18, 08/19/18, 10/12/19, 04/11/20, 12/06/20, 06/04/21,12/06/2021, 06/08/22, 12/05/22  Procedures:  No procedures performed Allergies: Codeine and Hydrocodone   Assessment / Plan:  Visit Diagnoses: No diagnosis found.  Orders: No orders of the defined types were placed in this encounter.  No orders of the defined types were placed in this encounter.   Face-to-face time spent with patient was *** minutes. Greater than 50% of time was spent in counseling and coordination of care.  Follow-Up Instructions: No follow-ups on file.   Ellen Henri, CMA  Note - This record has been created using Animal nutritionist.  Chart creation errors have been sought, but may not always  have been located. Such creation errors do not reflect on  the standard of medical care.

## 2023-08-09 DIAGNOSIS — R32 Unspecified urinary incontinence: Secondary | ICD-10-CM | POA: Diagnosis not present

## 2023-08-09 DIAGNOSIS — M359 Systemic involvement of connective tissue, unspecified: Secondary | ICD-10-CM | POA: Diagnosis not present

## 2023-08-09 DIAGNOSIS — M810 Age-related osteoporosis without current pathological fracture: Secondary | ICD-10-CM | POA: Diagnosis not present

## 2023-08-09 DIAGNOSIS — M797 Fibromyalgia: Secondary | ICD-10-CM | POA: Diagnosis not present

## 2023-08-09 DIAGNOSIS — E782 Mixed hyperlipidemia: Secondary | ICD-10-CM | POA: Diagnosis not present

## 2023-08-09 DIAGNOSIS — F330 Major depressive disorder, recurrent, mild: Secondary | ICD-10-CM | POA: Diagnosis not present

## 2023-08-09 DIAGNOSIS — E038 Other specified hypothyroidism: Secondary | ICD-10-CM | POA: Diagnosis not present

## 2023-08-09 DIAGNOSIS — R269 Unspecified abnormalities of gait and mobility: Secondary | ICD-10-CM | POA: Diagnosis not present

## 2023-08-12 DIAGNOSIS — R269 Unspecified abnormalities of gait and mobility: Secondary | ICD-10-CM | POA: Diagnosis not present

## 2023-08-13 ENCOUNTER — Ambulatory Visit: Payer: Medicare HMO | Admitting: Rheumatology

## 2023-08-13 DIAGNOSIS — Z8679 Personal history of other diseases of the circulatory system: Secondary | ICD-10-CM

## 2023-08-13 DIAGNOSIS — Z8669 Personal history of other diseases of the nervous system and sense organs: Secondary | ICD-10-CM

## 2023-08-13 DIAGNOSIS — R5383 Other fatigue: Secondary | ICD-10-CM

## 2023-08-13 DIAGNOSIS — Z8639 Personal history of other endocrine, nutritional and metabolic disease: Secondary | ICD-10-CM

## 2023-08-13 DIAGNOSIS — M19042 Primary osteoarthritis, left hand: Secondary | ICD-10-CM

## 2023-08-13 DIAGNOSIS — E559 Vitamin D deficiency, unspecified: Secondary | ICD-10-CM

## 2023-08-13 DIAGNOSIS — G8929 Other chronic pain: Secondary | ICD-10-CM

## 2023-08-13 DIAGNOSIS — Z5181 Encounter for therapeutic drug level monitoring: Secondary | ICD-10-CM

## 2023-08-13 DIAGNOSIS — F5101 Primary insomnia: Secondary | ICD-10-CM

## 2023-08-13 DIAGNOSIS — Z8659 Personal history of other mental and behavioral disorders: Secondary | ICD-10-CM

## 2023-08-13 DIAGNOSIS — Z96653 Presence of artificial knee joint, bilateral: Secondary | ICD-10-CM

## 2023-08-13 DIAGNOSIS — Z79899 Other long term (current) drug therapy: Secondary | ICD-10-CM

## 2023-08-13 DIAGNOSIS — M19072 Primary osteoarthritis, left ankle and foot: Secondary | ICD-10-CM

## 2023-08-13 DIAGNOSIS — M5030 Other cervical disc degeneration, unspecified cervical region: Secondary | ICD-10-CM

## 2023-08-13 DIAGNOSIS — M7062 Trochanteric bursitis, left hip: Secondary | ICD-10-CM

## 2023-08-13 DIAGNOSIS — M359 Systemic involvement of connective tissue, unspecified: Secondary | ICD-10-CM

## 2023-08-13 DIAGNOSIS — M810 Age-related osteoporosis without current pathological fracture: Secondary | ICD-10-CM

## 2023-08-13 DIAGNOSIS — M797 Fibromyalgia: Secondary | ICD-10-CM

## 2023-08-13 DIAGNOSIS — I890 Lymphedema, not elsewhere classified: Secondary | ICD-10-CM

## 2023-08-14 ENCOUNTER — Other Ambulatory Visit: Payer: Self-pay | Admitting: Physician Assistant

## 2023-08-15 IMAGING — DX DG CHEST 2V
2 series · 2 of 2 positions shown · non-contrast
Comparison: 01/20/2010, 09/05/2007

CLINICAL DATA: Chest pain, back pain, torso pain, nausea, vomiting,
diarrhea

EXAM:
CHEST - 2 VIEW

[chest pa]
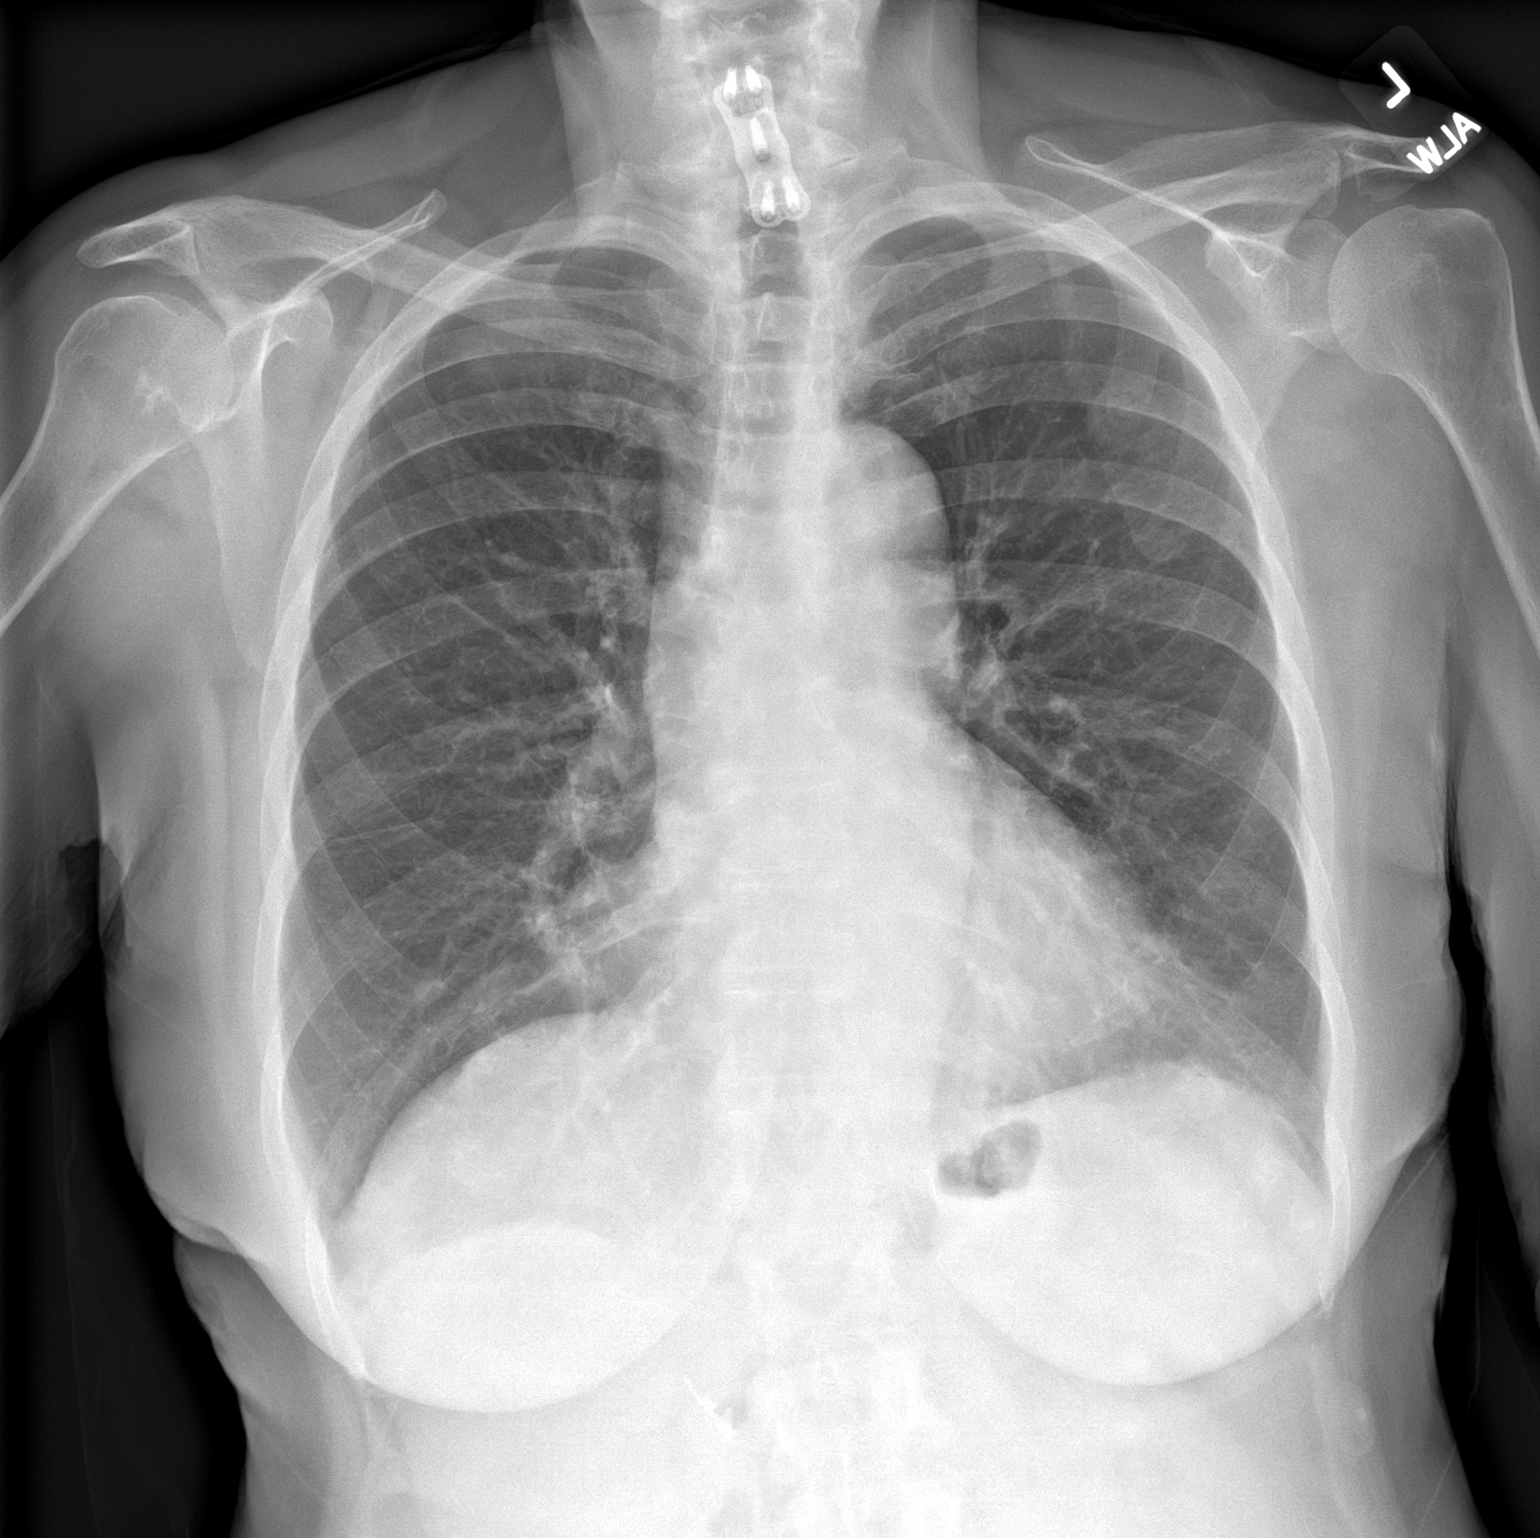

[chest lat]
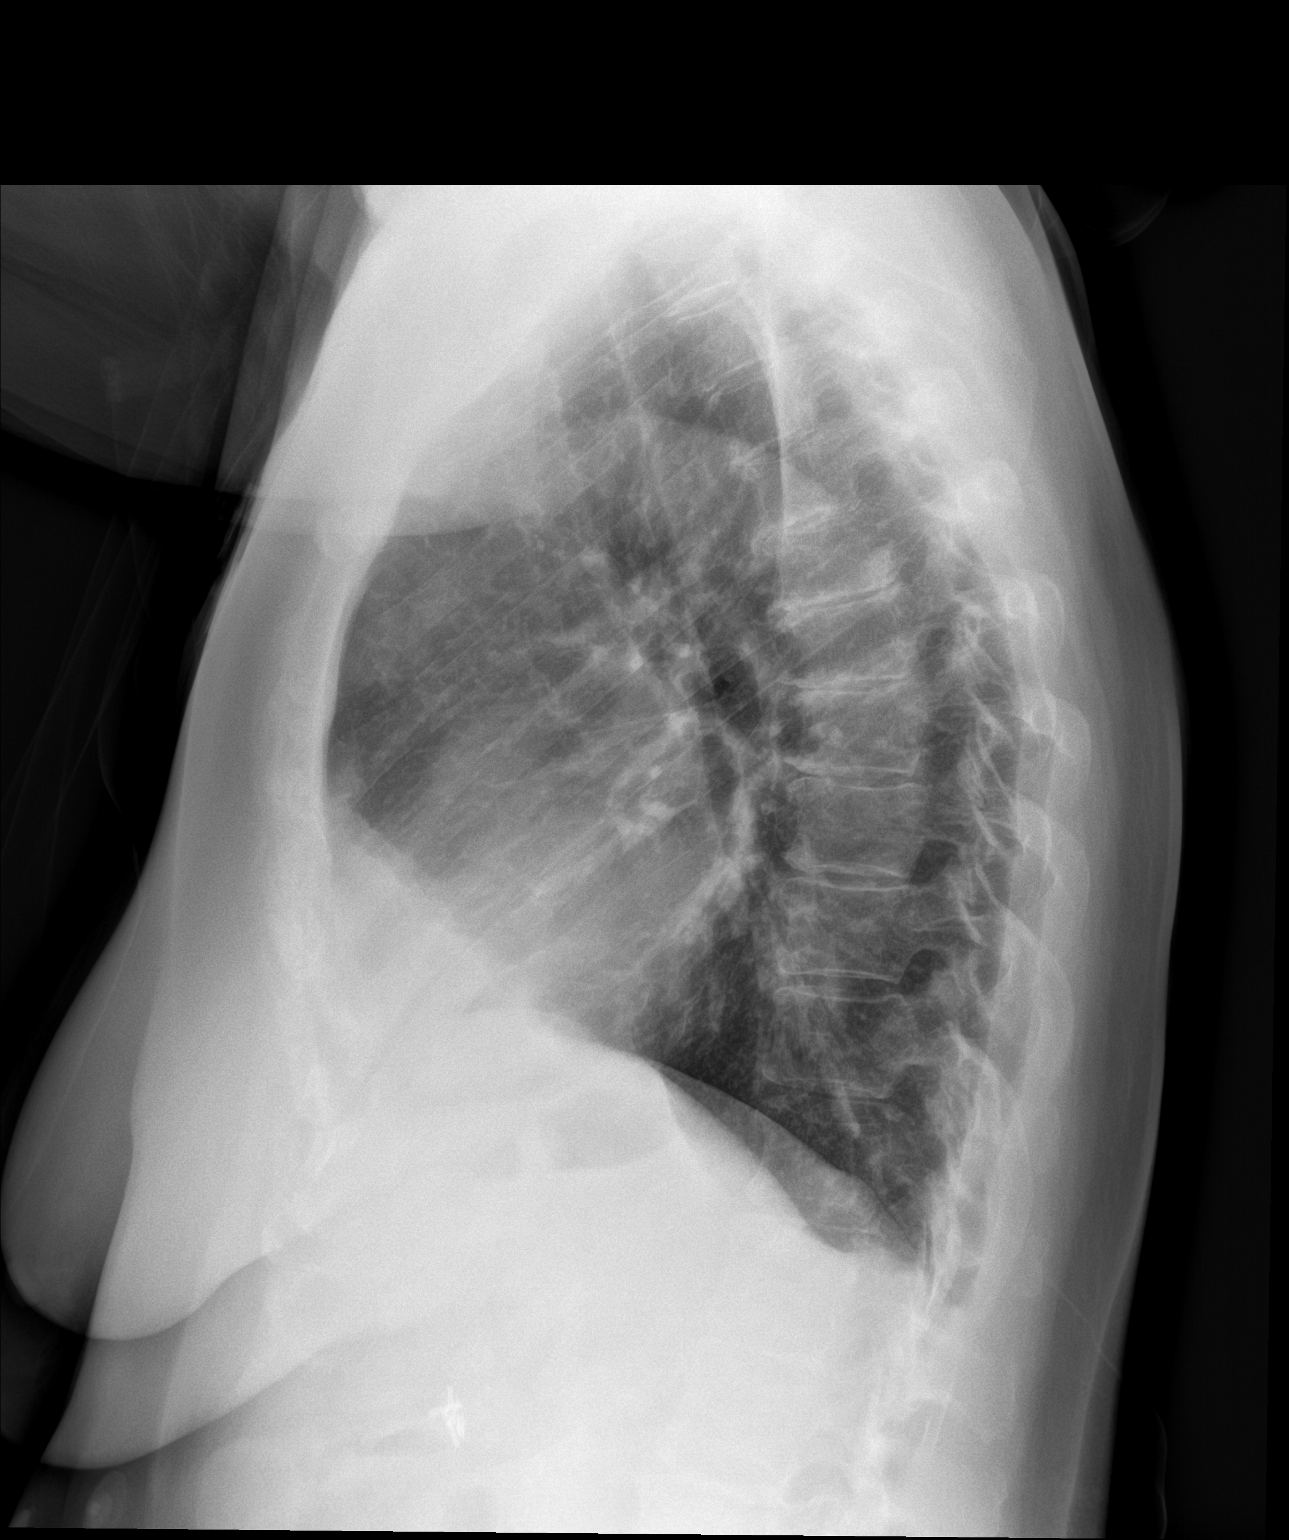

[2 of 2 positions shown; findings below may reference images not displayed]

FINDINGS: Normal heart size and pulmonary vascularity.

Density at RIGHT cardiophrenic angle increased since previous exam.

Lungs clear.

No infiltrate, pleural effusion, or pneumothorax.

Biconvex thoracic scoliosis with prior cervical spine fusion.
IMPRESSION: Density at RIGHT cardiophrenic angle, more prominent than on
previous studies from [DATE] represent a prominent epicardial fat
pad but mass not completely excluded; CT chest recommended to
exclude tumor.

## 2023-08-15 IMAGING — CT CT ANGIO CHEST-ABD-PELV FOR DISSECTION W/ AND WO/W CM
2 of 7 series · 14 of 46 positions shown, 16 images · IV contrast (APPLIED)
Comparison: None.

CLINICAL DATA: Aortic aneurysm, known or suspected

EXAM:
CT ANGIOGRAPHY CHEST, ABDOMEN AND PELVIS
TECHNIQUE: Non-contrast CT of the chest was initially obtained.

[Series 6: axial arterial · axial · arterial · 0.70mm/px · z∈[-419,+107]mm · 11 of 297 slices shown, 13 images]
[im 17/297  soft-tissue]
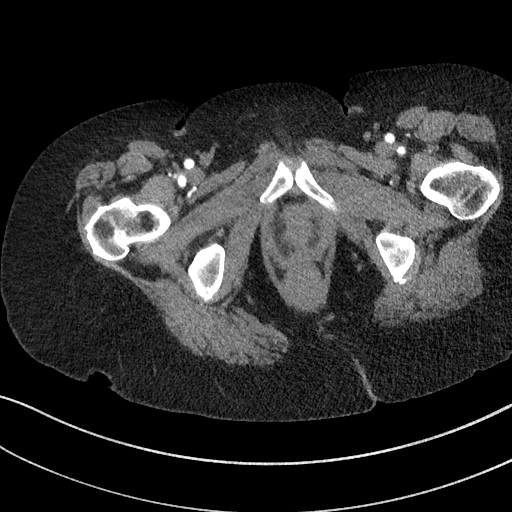
[im 17/297  bone]
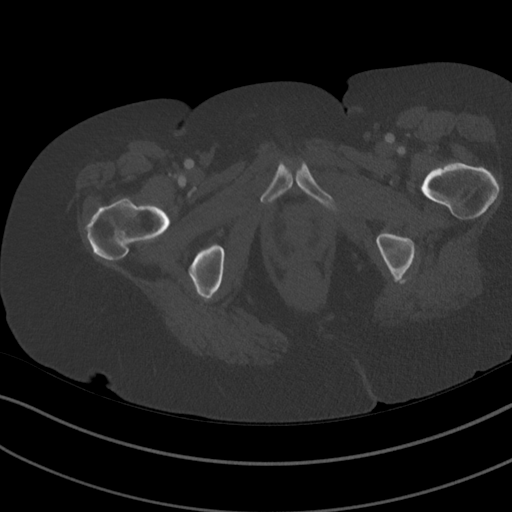
[im 50/297  soft-tissue]
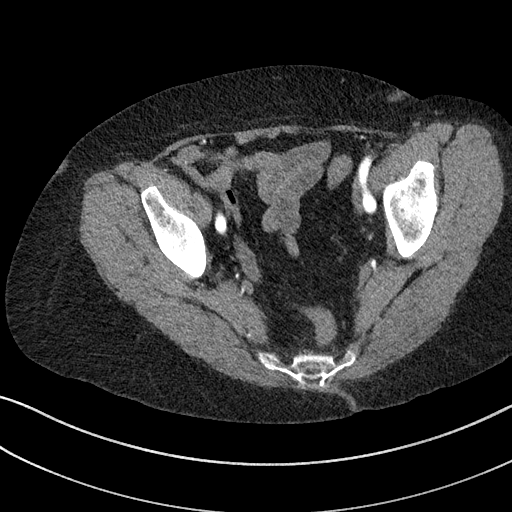
[im 66/297  soft-tissue]
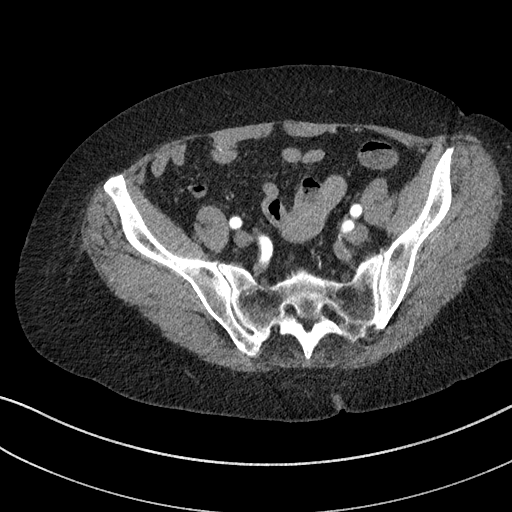
[im 99/297  soft-tissue]
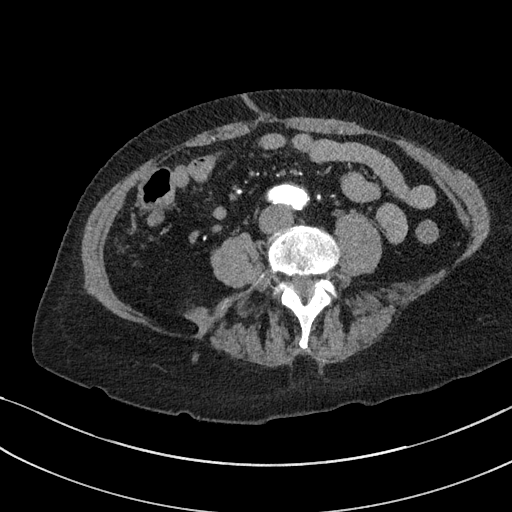
[im 116/297  soft-tissue]
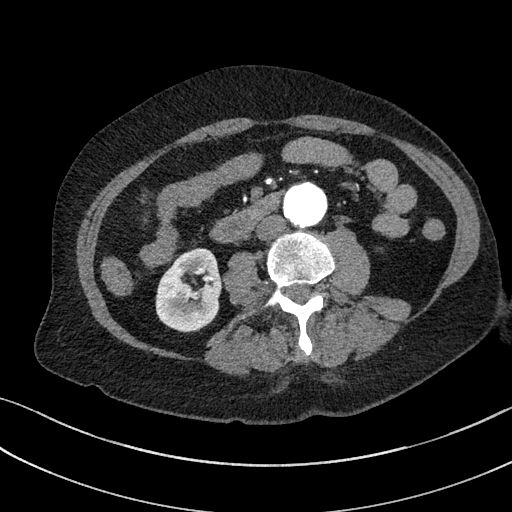
[im 149/297  soft-tissue]
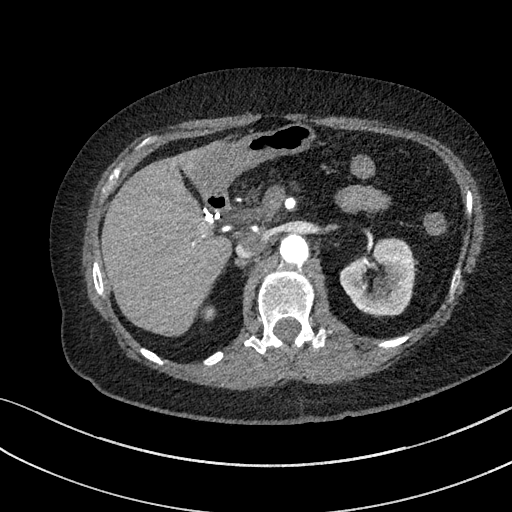
[im 181/297  soft-tissue]
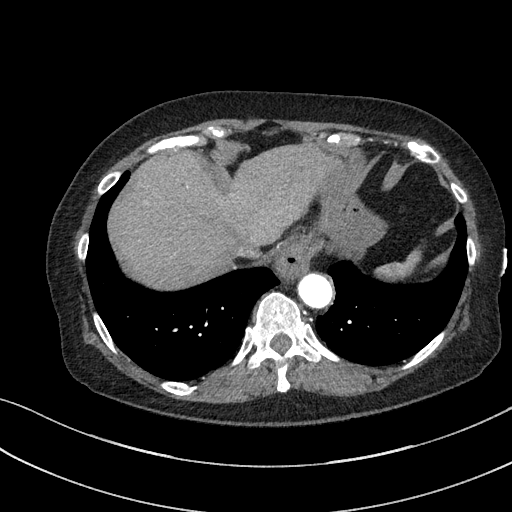
[im 198/297  soft-tissue]
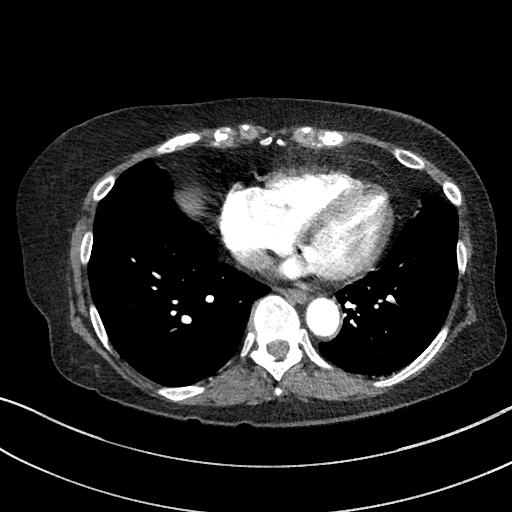
[im 231/297  soft-tissue]
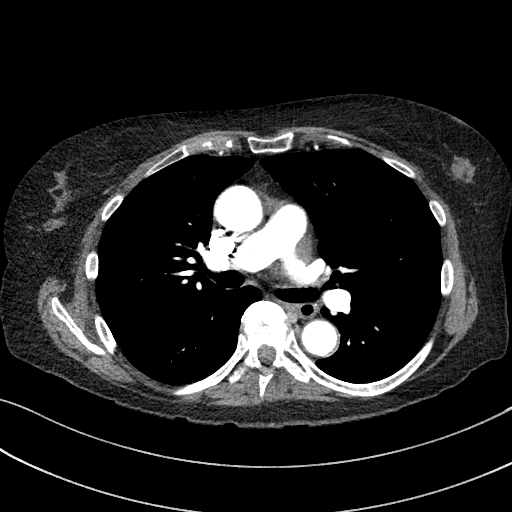
[im 231/297  bone]
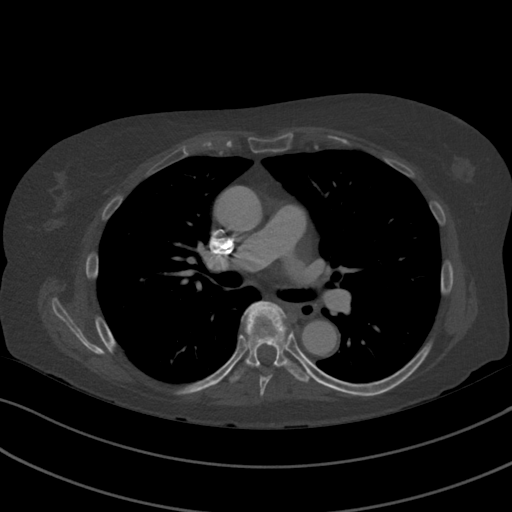
[im 247/297  soft-tissue]
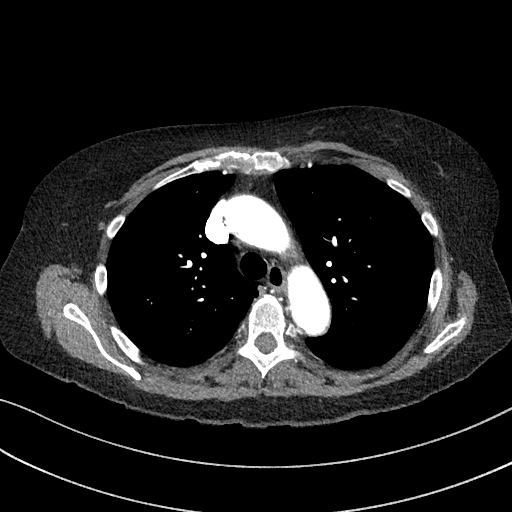
[im 280/297  soft-tissue]
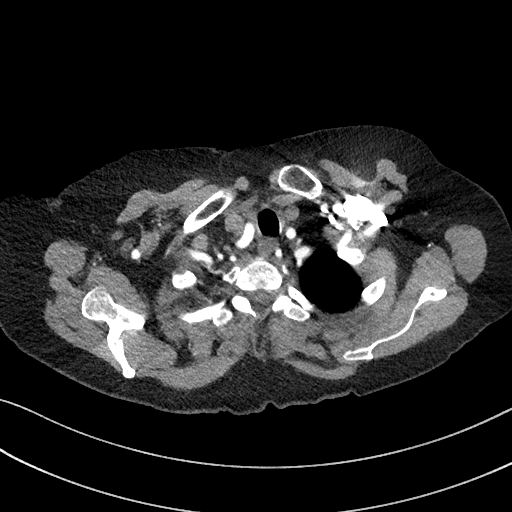

[Series 9: cor soft · coronal · 0.86mm/px · 3 of 130 slices shown]
[im 33/130  soft-tissue]
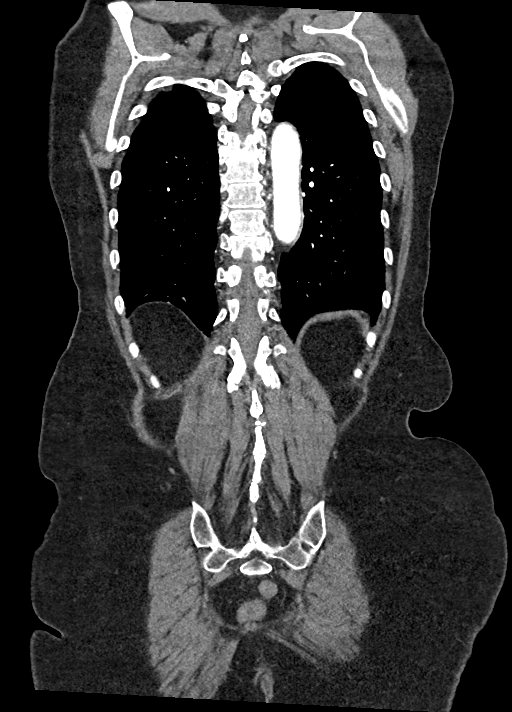
[im 65/130  soft-tissue]
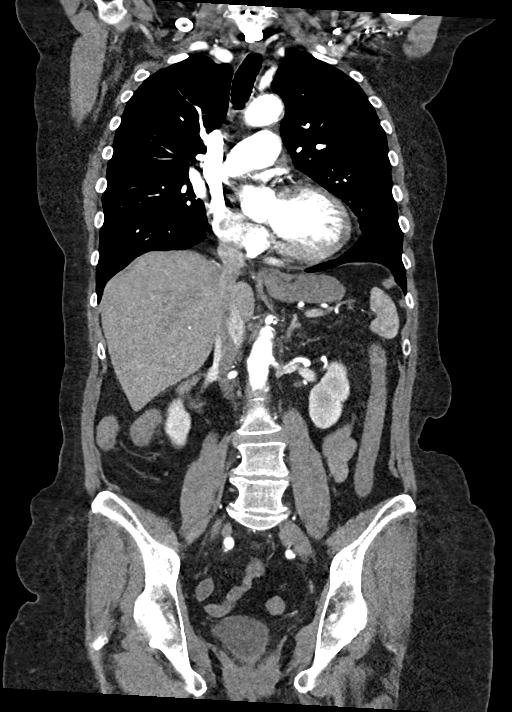
[im 97/130  soft-tissue]
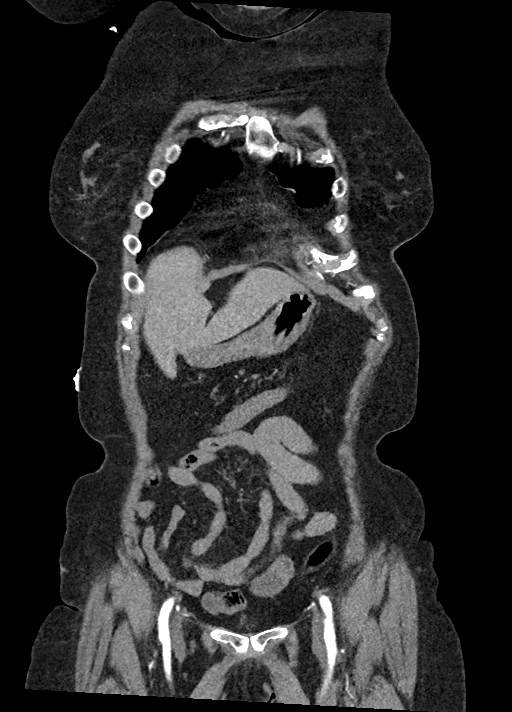

[14 of 46 positions shown; findings below may reference images not displayed]

Multidetector CT imaging through the chest, abdomen and pelvis was
performed using the standard protocol during bolus administration of
intravenous contrast. Multiplanar reconstructed images and MIPs were
obtained and reviewed to evaluate the vascular anatomy.

RADIATION DOSE REDUCTION: This exam was performed according to the
departmental dose-optimization program which includes automated
exposure control, adjustment of the mA and/or kV according to
patient size and/or use of iterative reconstruction technique.

CONTRAST:  85mL OMNIPAQUE IOHEXOL 350 MG/ML SOLN
FINDINGS: CTA CHEST

Cardiovascular: Preferential opacification of the thoracic aorta. No
evidence of thoracic aortic aneurysm or dissection. The pulmonary
arteries are normal in caliber without central filling defect.
Normal heart size. No pericardial effusion.

Mediastinum/Nodes: No enlarged mediastinal, hilar, or axillary lymph
nodes.

Lungs/Pleura: No pleural effusion. No pneumothorax. No mass or focal
consolidation. No suspicious pulmonary nodules.

CTA ABDOMEN AND PELVIS

VASCULAR

Aorta: Infrarenal abdominal aortic aneurysm measures 3.2 x 3.1 cm.
No findings of abdominal aortic dissection. Scattered calcified
atherosclerotic plaques.

Celiac: Patent without evidence of aneurysm, dissection, vasculitis
or significant stenosis.

SMA: Patent without evidence of aneurysm, dissection, vasculitis or
significant stenosis. Note is made of a replaced right hepatic
artery arising from the SMA.

IMA: Patent.

Renals: Both renal arteries are patent without evidence of aneurysm,
dissection, vasculitis, fibromuscular dysplasia or significant
stenosis. Single renal arteries bilaterally.

Inflow: Patent without evidence of aneurysm, dissection, vasculitis
or significant stenosis.

Veins: No obvious venous abnormality within the limitations of this
arterial phase study.

NON-VASCULAR

Hepatobiliary: The liver is normal in size without focal
abnormality. No intrahepatic or extrahepatic biliary ductal
dilation. Gallbladder surgically absent.

Spleen: Normal in size without focal abnormality.

Pancreas: Diffuse fatty replacement of the pancreas. No mass lesion
identified.

Adrenals/Urinary Tract: Adrenal glands are unremarkable. Kidneys are
normal in size without focal lesion. No right hydronephrosis. There
is a punctate 2 mm calcification overlying the left distal ureter,
with mild left-sided hydronephrosis. Bladder is unremarkable.

Stomach/Bowel: The stomach, small bowel and large bowel are normal
in caliber without abnormal wall thickening or surrounding
inflammatory changes. Small hiatal hernia.

Reproductive: Status post hysterectomy. No adnexal masses.

Lymphatic: No enlarged lymph nodes in the abdomen or pelvis.

Other: No abdominopelvic ascites.

Musculoskeletal: No aggressive osseous lesions. Degenerative changes
of the midthoracic and lower lumbar spine. The soft tissues are
unremarkable.

Review of the MIP images confirms the above findings.
IMPRESSION: Vascular:

1. No findings of aortic dissection or other acute vascular
pathology.
2. There is a infrarenal abdominal aortic aneurysm which measures
3.2 cm in size. Recommend follow-up ultrasound every 3 years. This
recommendation follows ACR consensus guidelines: White Paper of the
ACR Incidental Findings Committee II on Vascular Findings. [HOSPITAL] 2517; [DATE].

Nonvascular:

1. There is a small 2 mm calcification overlying the left distal
ureter which appears to result in mild left-sided hydronephrosis,
suspicious for a partially obstructing left ureteral stone (see key
image).

These results were called by telephone at the time of interpretation
on 11/30/2021 at [DATE] to provider SRI JAYANTI EFFENDI , who verbally
acknowledged these results.

## 2023-08-26 ENCOUNTER — Other Ambulatory Visit (HOSPITAL_COMMUNITY): Payer: Self-pay

## 2023-09-10 NOTE — Progress Notes (Signed)
Office Visit Note  Patient: Paula Murphy             Date of Birth: [birth year]           MRN: [id number]             PCP: Adrian Prince, MD Referring: Adrian Prince, MD Visit Date: 09/19/2023 Occupation: @GUAROCC @  Subjective:  Generalized pain  History of Present Illness: Paula Murphy is a 77 y.o. female with undifferentiated connective tissue disease, osteoarthritis, degenerative disc disease and osteoporosis.  Patient states she decided to come off hydroxychloroquine in October 2024 as she did not notice any improvement on hydroxychloroquine.  Patient states when she took hydroxychloroquine she had blurred far vision which improved after stopping hydroxychloroquine.  She took it for almost 1 year.  She continues to have pain and discomfort in all of her joints and muscles.  She states even her bones are painful.  She states the medications are not relieving her discomfort.  She states the pain ranges anywhere from 0-10.  She states with tramadol it can come down to 7.  She returns today after last visit on February 20, 2023.  She had left trochanteric bursa injection at the last visit.  The bursa injection according to patient was helpful.  She wants to have a repeat trochanteric bursa injection.  She is not having any discomfort in her knees which are lysed.  She is currently not experiencing much of the neck discomfort.  He believes that fibromyalgia is flaring with generalized pain and discomfort.    Activities of Daily Living:  Patient reports morning stiffness for all day. Patient Reports nocturnal pain.  Difficulty dressing/grooming: Denies Difficulty climbing stairs: Reports Difficulty getting out of chair: Reports Difficulty using hands for taps, buttons, cutlery, and/or writing: Reports  Review of Systems  Constitutional:  Positive for fatigue.  HENT:  Positive for mouth dryness. Negative for mouth sores.   Eyes:  Negative for dryness.  Respiratory:  Negative for  shortness of breath.   Cardiovascular:  Negative for chest pain and palpitations.  Gastrointestinal:  Negative for blood in stool, constipation and diarrhea.  Endocrine: Negative for increased urination.  Genitourinary:  Negative for involuntary urination.  Musculoskeletal:  Positive for joint pain, gait problem, joint pain, joint swelling, myalgias, muscle weakness, morning stiffness, muscle tenderness and myalgias.  Skin:  Negative for color change, rash, hair loss and sensitivity to sunlight.  Allergic/Immunologic: Negative for susceptible to infections.  Neurological:  Negative for dizziness and headaches.  Hematological:  Negative for swollen glands.  Psychiatric/Behavioral:  Positive for sleep disturbance. Negative for depressed mood. The patient is not nervous/anxious.     PMFS History:  Patient Active Problem List   Diagnosis Date Noted   Primary osteoarthritis of left knee 10/10/2020   Ingrown toenail 02/02/2019   Primary osteoarthritis of both feet 03/21/2017   History of macular degeneration 03/21/2017   Other fatigue 03/21/2017   Age-related osteoporosis without current pathological fracture 03/21/2017   Esophageal reflux 06/29/2013   Actinic keratosis 04/11/2013   OA (osteoarthritis) of knee 03/02/2013   DEPRESSION 07/24/2010   Major depressive disorder, single episode, unspecified 07/24/2010   UTI 07/04/2010   ABDOMINAL BLOATING 01/20/2010   Fibromyalgia 10/04/2009   INSOMNIA, CHRONIC 10/04/2009   Autoimmune disease (HCC) 10/04/2009   Vitamin D deficiency 10/26/2008   Hypothyroidism 08/13/2008   HYPERLIPIDEMIA 08/13/2008   Essential hypertension 08/13/2008   Headache 08/13/2008   Muscle pain 08/12/2008    Past Medical  History:  Diagnosis Date   Arthritis    OA AND PAIN RT KNEE   Autoimmune disease (HCC)    + ANA, and DS DNA--PT STATES SHE WAS TOLD SHE DOES NOT HAVE LUPUS AS PREVIOUSLY THOUGHT   Fibromyalgia    followed by Dr. Corliss Skains   Hemorrhoids     Hyperlipidemia    Hypothyroidism    Kidney stone    Macular degeneration    BOTH EYES   PONV (postoperative nausea and vomiting)    Thyroid disease    Hypothyroidism    Family History  Problem Relation Age of Onset   Stroke Mother    Hypertension Mother    Hyperlipidemia Mother    Cancer Mother        Lung   Alzheimer's disease Mother    COPD Mother    Stroke Father    Hypertension Father    Diabetes Father        Type II   Hyperlipidemia Father    Cancer Father        Lung   Arthritis Father        Rheumatoid   COPD Father    COPD Brother    Thyroid disease Daughter    Breast cancer Cousin    Breast cancer Cousin    Past Surgical History:  Procedure Laterality Date   ABDOMINAL HYSTERECTOMY  11/06/1987   BUNIONECTOMY Left 11/05/2010   CHOLECYSTECTOMY  11/05/1994   EYE SURGERY     BILATERAL CATARACT EXTRACTION   KNEE ARTHROSCOPY  11/05/1992   Right Knee   KNEE SURGERY     NECK SURGERY  11/05/2001   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   on face   TONSILLECTOMY  11/06/1951   TOTAL KNEE ARTHROPLASTY Right 03/02/2013   Procedure: RIGHT TOTAL KNEE ARTHROPLASTY;  Surgeon: Loanne Drilling, MD;  Location: [facility];  Service: Orthopedics;  Laterality: Right;   TOTAL KNEE ARTHROPLASTY Left 10/10/2020   Procedure: TOTAL KNEE ARTHROPLASTY;  Surgeon: Ollen Gross, MD;  Location: [facility];  Service: Orthopedics;  Laterality: Left;    Social History   Social History Narrative   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  Constitutional:      Appearance: She is well-developed.  HENT:     Head: Normocephalic and atraumatic.  Eyes:     Conjunctiva/sclera: Conjunctivae normal.  Cardiovascular:     Rate and Rhythm: Normal rate and regular rhythm.     Heart sounds: Normal heart sounds.  Pulmonary:     Effort: Pulmonary effort is normal.     Breath sounds: Normal breath sounds.  Abdominal:     General: Bowel sounds are normal.     Palpations: Abdomen is soft.  Musculoskeletal:     Cervical back: Normal range of motion.  Lymphadenopathy:  Cervical: No cervical adenopathy.  Skin:    General: Skin is warm and dry.     Capillary Refill: Capillary refill takes less than 2 seconds.  Neurological:     Mental Status: She is alert and oriented to person, place, and time.  Psychiatric:        Behavior: Behavior normal.      Musculoskeletal Exam: She had limited range of motion of the cervical spine.  Shoulder joints, elbow joints, wrist joints, MCPs PIPs and DIPs been good range of motion with no synovitis.  Hip joints and knee joints with good range of motion without any warmth swelling or effusion.  There is tenderness over left trochanteric bursa.  There is no tenderness over ankles or MTPs.  She had generalized hyperalgesia and positive tender points.  CDAI Exam: CDAI Score: -- Patient Global: --; Provider Global: -- Swollen: --; Tender: -- Joint Exam 09/19/2023   No joint exam has been documented for this visit   There is currently no information documented on the homunculus. Go to the Rheumatology activity and complete the homunculus joint exam.  Investigation: No additional findings.  Imaging: No results found.  Recent Labs: Lab Results  Component Value Date   WBC 5.4 02/20/2023   HGB 11.9  02/20/2023   PLT 239 02/20/2023   NA 142 05/27/2023   K 3.8 05/27/2023   CL 111 (H) 05/27/2023   CO2 27 05/27/2023   GLUCOSE 112 (H) 05/27/2023   BUN 18 05/27/2023   CREATININE 0.87 05/27/2023   BILITOT 0.3 05/27/2023   ALKPHOS 54 11/30/2021   AST 12 05/27/2023   ALT 10 05/27/2023   PROT 6.1 05/27/2023   ALBUMIN 3.7 11/30/2021   CALCIUM 9.1 05/27/2023   GFRAA 85 11/30/2020    Speciality Comments: PLQ Eye Exam: 08/22/2022 WNL @ [facility] Follow up in 1 year  Prolia: 02/07/18, 08/19/18, 10/12/19, 04/11/20, 12/06/20, 06/04/21,12/06/2021, 06/08/22, 12/05/22, 06/04/23  Procedures:  Large Joint Inj: L greater trochanter on 09/19/2023 1:37 PM Indications: pain Details: 27 G 1.5 in needle, lateral approach  Arthrogram: No  Medications: 40 mg triamcinolone acetonide 40 MG/ML; 1.5 mL lidocaine 1 % Aspirate: 0 mL Outcome: tolerated well, no immediate complications Procedure, treatment alternatives, risks and benefits explained, specific risks discussed. Consent was given by the patient. Immediately prior to procedure a time out was called to verify the correct patient, procedure, equipment, support staff and site/side marked as required. Patient was prepped and draped in the usual sterile fashion.     Allergies: Codeine, Hydrocodone, and Plaquenil [hydroxychloroquine]   Assessment / Plan:     Visit Diagnoses: Undifferentiated connective tissue disease (HCC) - positive ANA, positive double-stranded DNA, fatigue, arthralgias. -She continues to have arthralgias and myalgias.  No synovitis was noted on the examination.  Patient decided to come off hydroxychloroquine in October 2024 after taking it for almost 1 year.  Patient did not notice any improvement on hydroxychloroquine.  She also felt that it was affecting her far vision.  She states her vision is back to normal after stopping hydroxychloroquine.  She denies any history of oral ulcers, nasal ulcers, malar rash, photosensitivity,  Raynaud's or lymphadenopathy.  I will obtain autoimmune labs today.  February 20, 2023 double-stranded DNA was 50, complements normal, sed rate normal.  Plan: Protein / creatinine ratio, urine, CBC with Differential/Platelet, COMPLETE METABOLIC PANEL WITH GFR, Anti-DNA antibody, double-stranded, C3 and C4, Sedimentation rate  High risk medication use -(Plaquenil 200 mg p.o. twice daily Monday to Friday  started October 2023-till October 2024 and then discontinued PLQ Eye Exam: 08/22/2022.  May 27, 2023 CMP was normal.  Vitamin D was 42.  Primary osteoarthritis of both hands-she continues to have pain and stiffness in her hands.  No synovitis was noted.  Trochanteric bursitis of left hip-she had good response to trochanteric bursa injection in April 2024.  She requested repeat trochanteric bursa injection.  After informed consent was obtained and side effects were discussed left trochanteric bursa was injected as described above.  She tolerated the procedure well.  Postprocedure instructions were given.  IT band stretches were advised.  Status post total bilateral knee replacement -she denies any discomfort today.  Right total knee replacement February 03, 2013, left total knee replaced in October 10, 2000 Dr. Despina Hick.  Primary osteoarthritis of both feet-denies discomfort today.  Although she has been experiencing intermittent pain.  DDD (degenerative disc disease), cervical -she had limited range of motion without discomfort.  Status post ACDF C5-C7.  Age-related osteoporosis without current pathological fracture - January 02, 2022 DEXA scan T score -1.9, BMD 0.642 AP spine 4% improvement.  She has been on Prolia since April 2019.  Last Prolia injection: 06/04/2023  Fibromyalgia -she continues to have generalized pain and discomfort.  She describes pain in all of her muscles and joints.  Patient states with the tramadol her pain is not controlled.  She states the pain becomes manageable to some extent.  She  would like to be referred to pain management clinic.  Will place the referral per patient's request.  Tramadol 50 mg 1 to 2 tablets p.o. twice daily as needed. UDS: 11/28/2022 narcotic agreement: 10/17/2022.  Will get UDS today.  Narcotic agreement was renewed.  Encounter for chronic pain management-patient will be referred to pain management clinic per her request.  Medication monitoring encounter - Plan: DRUG MONITOR, TRAMADOL,QN, URINE, DRUG MONITOR, PANEL 5, W/CONF, URINE  Vitamin D deficiency  Other fatigue-she can history of fatigue related to fibromyalgia.  Primary insomnia-good hygiene was discussed.  History of hyperlipidemia  History of hypertension-blood pressure was 140/83.  She was advised to monitor blood pressure closely and follow-up with the PCP.  Lymphedema  History of anxiety  History of macular degeneration  History of hypothyroidism  History of depression  Orders: Orders Placed This Encounter  Procedures   Large Joint Inj: L greater trochanter   DRUG MONITOR, TRAMADOL,QN, URINE   DRUG MONITOR, PANEL 5, W/CONF, URINE   Protein / creatinine ratio, urine   CBC with Differential/Platelet   COMPLETE METABOLIC PANEL WITH GFR   Anti-DNA antibody, double-stranded   C3 and C4   Sedimentation rate   Ambulatory referral to Physical Medicine Rehab   No orders of the defined types were placed in this encounter.   Follow-Up Instructions: Return in about 6 months (around 03/18/2024) for UDCTD, OA, OP.   Pollyann Savoy, MD  Note - This record has been created using Animal nutritionist.  Chart creation errors have been sought, but may not always  have been located. Such creation errors do not reflect on  the standard of medical care.

## 2023-09-17 DIAGNOSIS — N3941 Urge incontinence: Secondary | ICD-10-CM | POA: Diagnosis not present

## 2023-09-17 DIAGNOSIS — N393 Stress incontinence (female) (male): Secondary | ICD-10-CM | POA: Diagnosis not present

## 2023-09-19 ENCOUNTER — Encounter: Payer: Self-pay | Admitting: Rheumatology

## 2023-09-19 ENCOUNTER — Ambulatory Visit: Payer: Medicare HMO | Attending: Rheumatology | Admitting: Rheumatology

## 2023-09-19 VITALS — BP 140/83 | HR 71 | Resp 15 | Ht 61.0 in | Wt 163.6 lb

## 2023-09-19 DIAGNOSIS — M7062 Trochanteric bursitis, left hip: Secondary | ICD-10-CM | POA: Diagnosis not present

## 2023-09-19 DIAGNOSIS — M19071 Primary osteoarthritis, right ankle and foot: Secondary | ICD-10-CM | POA: Diagnosis not present

## 2023-09-19 DIAGNOSIS — M19041 Primary osteoarthritis, right hand: Secondary | ICD-10-CM | POA: Diagnosis not present

## 2023-09-19 DIAGNOSIS — M5030 Other cervical disc degeneration, unspecified cervical region: Secondary | ICD-10-CM

## 2023-09-19 DIAGNOSIS — M810 Age-related osteoporosis without current pathological fracture: Secondary | ICD-10-CM

## 2023-09-19 DIAGNOSIS — M19042 Primary osteoarthritis, left hand: Secondary | ICD-10-CM

## 2023-09-19 DIAGNOSIS — R5383 Other fatigue: Secondary | ICD-10-CM

## 2023-09-19 DIAGNOSIS — E559 Vitamin D deficiency, unspecified: Secondary | ICD-10-CM

## 2023-09-19 DIAGNOSIS — Z96653 Presence of artificial knee joint, bilateral: Secondary | ICD-10-CM

## 2023-09-19 DIAGNOSIS — M359 Systemic involvement of connective tissue, unspecified: Secondary | ICD-10-CM | POA: Diagnosis not present

## 2023-09-19 DIAGNOSIS — Z5181 Encounter for therapeutic drug level monitoring: Secondary | ICD-10-CM

## 2023-09-19 DIAGNOSIS — Z8639 Personal history of other endocrine, nutritional and metabolic disease: Secondary | ICD-10-CM

## 2023-09-19 DIAGNOSIS — M797 Fibromyalgia: Secondary | ICD-10-CM

## 2023-09-19 DIAGNOSIS — Z79899 Other long term (current) drug therapy: Secondary | ICD-10-CM | POA: Diagnosis not present

## 2023-09-19 DIAGNOSIS — I890 Lymphedema, not elsewhere classified: Secondary | ICD-10-CM

## 2023-09-19 DIAGNOSIS — G8929 Other chronic pain: Secondary | ICD-10-CM

## 2023-09-19 DIAGNOSIS — Z8669 Personal history of other diseases of the nervous system and sense organs: Secondary | ICD-10-CM

## 2023-09-19 DIAGNOSIS — Z8659 Personal history of other mental and behavioral disorders: Secondary | ICD-10-CM

## 2023-09-19 DIAGNOSIS — M19072 Primary osteoarthritis, left ankle and foot: Secondary | ICD-10-CM

## 2023-09-19 DIAGNOSIS — Z8679 Personal history of other diseases of the circulatory system: Secondary | ICD-10-CM

## 2023-09-19 DIAGNOSIS — F5101 Primary insomnia: Secondary | ICD-10-CM

## 2023-09-19 MED ORDER — LIDOCAINE HCL 1 % IJ SOLN
1.5000 mL | INTRAMUSCULAR | Status: AC | PRN
Start: 2023-09-19 — End: 2023-09-19
  Administered 2023-09-19: 1.5 mL

## 2023-09-19 MED ORDER — TRIAMCINOLONE ACETONIDE 40 MG/ML IJ SUSP
40.0000 mg | INTRAMUSCULAR | Status: AC | PRN
Start: 2023-09-19 — End: 2023-09-19
  Administered 2023-09-19: 40 mg via INTRA_ARTICULAR

## 2023-09-20 LAB — COMPLETE METABOLIC PANEL WITH GFR
AG Ratio: 1.6 (calc) (ref 1.0–2.5)
ALT: 8 U/L (ref 6–29)
AST: 13 U/L (ref 10–35)
Albumin: 3.9 g/dL (ref 3.6–5.1)
Alkaline phosphatase (APISO): 51 U/L (ref 37–153)
BUN: 16 mg/dL (ref 7–25)
CO2: 26 mmol/L (ref 20–32)
Calcium: 8.6 mg/dL (ref 8.6–10.4)
Chloride: 107 mmol/L (ref 98–110)
Creat: 0.8 mg/dL (ref 0.60–1.00)
Globulin: 2.5 g/dL (ref 1.9–3.7)
Glucose, Bld: 92 mg/dL (ref 65–99)
Potassium: 4.1 mmol/L (ref 3.5–5.3)
Sodium: 139 mmol/L (ref 135–146)
Total Bilirubin: 0.3 mg/dL (ref 0.2–1.2)
Total Protein: 6.4 g/dL (ref 6.1–8.1)
eGFR: 76 mL/min/{1.73_m2} (ref >=60)

## 2023-09-20 LAB — CBC WITH DIFFERENTIAL/PLATELET
Absolute Lymphocytes: 2722 {cells}/uL (ref 850–3900)
Absolute Monocytes: 358 {cells}/uL (ref 200–950)
Basophils Absolute: 28 {cells}/uL (ref 0–200)
Basophils Relative: 0.5 %
Eosinophils Absolute: 140 {cells}/uL (ref 15–500)
Eosinophils Relative: 2.5 %
HCT: 39.2 % (ref 35.0–45.0)
Hemoglobin: 12.7 g/dL (ref 11.7–15.5)
MCH: 31.1 pg (ref 27.0–33.0)
MCHC: 32.4 g/dL (ref 32.0–36.0)
MCV: 96.1 fL (ref 80.0–100.0)
MPV: 10.9 fL (ref 7.5–12.5)
Monocytes Relative: 6.4 %
Neutro Abs: 2352 {cells}/uL (ref 1500–7800)
Neutrophils Relative %: 42 %
Platelets: 216 10*3/uL (ref 140–400)
RBC: 4.08 10*6/uL (ref 3.80–5.10)
RDW: 11.9 % (ref 11.0–15.0)
Total Lymphocyte: 48.6 %
WBC: 5.6 10*3/uL (ref 3.8–10.8)

## 2023-09-20 LAB — ANTI-DNA ANTIBODY, DOUBLE-STRANDED: ds DNA Ab: 53 [IU]/mL — ABNORMAL HIGH

## 2023-09-20 LAB — C3 AND C4
C3 Complement: 124 mg/dL (ref 83–193)
C4 Complement: 21 mg/dL (ref 15–57)

## 2023-09-20 LAB — PROTEIN / CREATININE RATIO, URINE
Creatinine, Urine: 193 mg/dL (ref 20–275)
Protein/Creat Ratio: 124 mg/g{creat} (ref 24–184)
Protein/Creatinine Ratio: 0.124 mg/mg{creat} (ref 0.024–0.184)
Total Protein, Urine: 24 mg/dL (ref 5–24)

## 2023-09-20 LAB — SEDIMENTATION RATE: Sed Rate: 6 mm/h (ref 0–30)

## 2023-09-22 NOTE — Progress Notes (Signed)
CBC and CMP are normal, urine protein creatinine ratio negative, double-stranded DNA remains positive and stable titer, C3-C4 normal, sedimentation rate normal.  Labs do not indicate an autoimmune disease flare.

## 2023-09-25 LAB — DRUG MONITOR, TRAMADOL,QN, URINE
Desmethyltramadol: 8086 ng/mL — ABNORMAL HIGH (ref <100)
Tramadol: >10000 ng/mL — ABNORMAL HIGH (ref <100)

## 2023-09-25 LAB — DM TEMPLATE

## 2023-09-25 NOTE — Progress Notes (Signed)
UDS is consistent with tramadol use.

## 2023-09-26 LAB — DM TEMPLATE

## 2023-09-26 LAB — DRUG MONITOR, TRAMADOL,QN, URINE
Desmethyltramadol: 8086 ng/mL — ABNORMAL HIGH (ref <100)
Tramadol: >10000 ng/mL — ABNORMAL HIGH (ref <100)

## 2023-09-30 ENCOUNTER — Other Ambulatory Visit: Payer: Self-pay | Admitting: *Deleted

## 2023-09-30 MED ORDER — TRAMADOL HCL 50 MG PO TABS: ORAL_TABLET | ORAL | 0 refills | Status: DC

## 2023-09-30 NOTE — Telephone Encounter (Signed)
Last Fill: 07/23/2023  UDS:09/19/2023 UDS is consistent with tramadol use.   Narc Agreement: Narcotic agreement was renewed. 09/19/2023  Next Visit: 03/19/2024  Last Visit: 09/19/2023  Dx: Fibromyalgia   Current Dose per office note on 09/19/2023:Tramadol 50 mg 1 to 2 tablets p.o. twice daily as needed    Okay to refill Tramadol?

## 2023-10-07 ENCOUNTER — Encounter: Payer: Self-pay | Admitting: Physical Medicine & Rehabilitation

## 2023-10-11 ENCOUNTER — Other Ambulatory Visit: Payer: Self-pay | Admitting: Rheumatology

## 2023-10-11 NOTE — Telephone Encounter (Signed)
Last Fill: 03/21/2023  Next Visit: 03/19/2024  Last Visit: 09/19/2023  Dx: Fibromyalgia   Current Dose per office note on 09/19/2023: not discussed  Okay to refill Topamax?

## 2023-10-17 ENCOUNTER — Encounter: Payer: Medicare HMO | Admitting: Physical Medicine & Rehabilitation

## 2023-11-04 DIAGNOSIS — D3131 Benign neoplasm of right choroid: Secondary | ICD-10-CM | POA: Diagnosis not present

## 2023-11-04 DIAGNOSIS — Z961 Presence of intraocular lens: Secondary | ICD-10-CM | POA: Diagnosis not present

## 2023-11-04 DIAGNOSIS — H04123 Dry eye syndrome of bilateral lacrimal glands: Secondary | ICD-10-CM | POA: Diagnosis not present

## 2023-11-04 DIAGNOSIS — H353131 Nonexudative age-related macular degeneration, bilateral, early dry stage: Secondary | ICD-10-CM | POA: Diagnosis not present

## 2023-11-04 DIAGNOSIS — H57812 Brow ptosis, left: Secondary | ICD-10-CM | POA: Diagnosis not present

## 2023-11-05 ENCOUNTER — Other Ambulatory Visit: Payer: Self-pay

## 2023-11-07 ENCOUNTER — Other Ambulatory Visit (HOSPITAL_COMMUNITY): Payer: Self-pay

## 2023-11-21 ENCOUNTER — Other Ambulatory Visit: Payer: Self-pay

## 2023-12-05 ENCOUNTER — Other Ambulatory Visit: Payer: Self-pay

## 2023-12-05 MED ORDER — TRAMADOL HCL 50 MG PO TABS: ORAL_TABLET | ORAL | 0 refills | Status: DC

## 2023-12-05 NOTE — Telephone Encounter (Signed)
Refill request received via fax from [facility] Order for Tramadol  Last Fill: 09/30/2023  UDS:09/19/2023 UDS is consistent with tramadol use.   Narc Agreement: 09/19/2023  Next Visit: 03/19/2024  Last Visit: 09/19/2023  Dx: Fibromyalgia   Current Dose per office note on 09/19/2023: Tramadol 50 mg 1 to 2 tablets p.o. twice daily as needed    Okay to refill Tramadol?

## 2023-12-06 ENCOUNTER — Other Ambulatory Visit: Payer: Self-pay

## 2023-12-06 MED ORDER — TOPIRAMATE 50 MG PO TABS: ORAL_TABLET | ORAL | 0 refills | Status: DC

## 2023-12-06 NOTE — Telephone Encounter (Signed)
Patient has changed insurance plans and now is required to use [facility]. She is requesting a refill of Topamax to [facility] because she has received a letter from them stating they will "close the request out unless they receive a prescription"  Last Fill: 10/11/2023  Next Visit: 03/19/2024  Last Visit: 09/19/2023  DX: Fibromyalgia   Current Dose per office note on 09/19/2023: not discussed.   Okay to refill topamax?

## 2023-12-12 ENCOUNTER — Telehealth: Payer: Self-pay | Admitting: Pharmacist

## 2023-12-12 ENCOUNTER — Other Ambulatory Visit (HOSPITAL_COMMUNITY): Payer: Self-pay

## 2023-12-12 DIAGNOSIS — Z79899 Other long term (current) drug therapy: Secondary | ICD-10-CM

## 2023-12-12 DIAGNOSIS — M810 Age-related osteoporosis without current pathological fracture: Secondary | ICD-10-CM

## 2023-12-12 NOTE — Telephone Encounter (Signed)
 Patient due for Prolia  on 12/01/23. Per test claim, copay is $300.  ATC patient - phone went straight to VM. Left VM regarding bloodwork  Geraldene Kleine, PharmD, MPH, BCPS, CPP Clinical Pharmacist (Rheumatology and Pulmonology)

## 2023-12-13 ENCOUNTER — Other Ambulatory Visit: Payer: Self-pay

## 2023-12-23 ENCOUNTER — Other Ambulatory Visit: Payer: Self-pay

## 2023-12-26 ENCOUNTER — Other Ambulatory Visit: Payer: Self-pay

## 2023-12-26 DIAGNOSIS — M810 Age-related osteoporosis without current pathological fracture: Secondary | ICD-10-CM

## 2023-12-30 ENCOUNTER — Other Ambulatory Visit (HOSPITAL_COMMUNITY): Payer: Self-pay

## 2023-12-30 ENCOUNTER — Telehealth: Payer: Self-pay | Admitting: Rheumatology

## 2023-12-30 NOTE — Telephone Encounter (Signed)
Patient left a voicemail stating she was returning Devki's call.   °

## 2023-12-30 NOTE — Telephone Encounter (Signed)
 Called patient about Prolia. She states that she cannot afford this $300 copay. Unfortunately there are no osteoporosis grants available.  It looks like Dr. Evlyn Kanner placed referral to [facility] but I advised her cost would likely be the same but she'd be able to set up payment plan to distribute cost. She will call back tomo regarding this.  Chesley Mires, PharmD, MPH, BCPS, CPP Clinical Pharmacist (Rheumatology and Pulmonology)

## 2023-12-31 ENCOUNTER — Other Ambulatory Visit: Payer: Self-pay

## 2023-12-31 MED ORDER — PROLIA 60 MG/ML ~~LOC~~ SOSY
60.0000 mg | PREFILLED_SYRINGE | SUBCUTANEOUS | 0 refills | Status: DC
Start: 2023-12-31 — End: 2024-06-30
  Filled 2023-12-31 (×2): qty 1, 180d supply, fill #0

## 2023-12-31 NOTE — Telephone Encounter (Signed)
 Returned call to patient. She will come either today or tomorrow for bloodwork

## 2023-12-31 NOTE — Progress Notes (Signed)
 Specialty Pharmacy Refill Coordination Note  Paula Murphy is a 78 y.o. female contacted today regarding refills of specialty medication(s) Denosumab (Prolia)   Patient requested Courier to Provider Office   Delivery date: 01/06/24   Verified address: [address] [place], [place] [address]   Medication will be filled on 01/03/24.   Patient authorized $300 copayment to be charged to credit card.

## 2023-12-31 NOTE — Telephone Encounter (Signed)
 Patient states she wants to come to clinic for Prolia. Rx sent to [facility]. She will out of town from this Thursday until likely next Wednesday  Chesley Mires, PharmD, MPH, BCPS, CPP Clinical Pharmacist (Rheumatology and Pulmonology)

## 2024-01-01 ENCOUNTER — Other Ambulatory Visit: Payer: Self-pay | Admitting: Rheumatology

## 2024-01-01 ENCOUNTER — Encounter: Payer: Self-pay | Admitting: Endocrinology

## 2024-01-01 ENCOUNTER — Telehealth: Payer: Self-pay

## 2024-01-01 ENCOUNTER — Other Ambulatory Visit: Payer: Self-pay | Admitting: *Deleted

## 2024-01-01 DIAGNOSIS — Z79899 Other long term (current) drug therapy: Secondary | ICD-10-CM

## 2024-01-01 DIAGNOSIS — M810 Age-related osteoporosis without current pathological fracture: Secondary | ICD-10-CM

## 2024-01-01 NOTE — Telephone Encounter (Signed)
 Last Fill: 12/05/2023   UDS:09/19/2023 UDS is consistent with tramadol use.    Narc Agreement: 09/19/2023   Next Visit: 03/19/2024   Last Visit: 09/19/2023   Dx: Fibromyalgia    Current Dose per office note on 09/19/2023: Tramadol 50 mg 1 to 2 tablets p.o. twice daily as needed      Okay to refill Tramadol?

## 2024-01-01 NOTE — Telephone Encounter (Signed)
 Dr. Evlyn Kanner, patient will be scheduled as soon as possible.  Auth Submission: APPROVED Site of care: Site of care: [facility] Payer: [employer] medicare Medication & CPT/J Code(s) submitted: Prolia (Denosumab) [id number] Route of submission (phone, fax, portal): portal Phone # Fax # Auth type: Buy/Bill PB Units/visits requested: 60mg  x 2 doses Reference number: [id number] Approval from: 01/01/24 to 12/31/24

## 2024-01-02 LAB — COMPREHENSIVE METABOLIC PANEL
AG Ratio: 1.4 (calc) (ref 1.0–2.5)
ALT: 6 U/L (ref 6–29)
AST: 12 U/L (ref 10–35)
Albumin: 3.6 g/dL (ref 3.6–5.1)
Alkaline phosphatase (APISO): 61 U/L (ref 37–153)
BUN: 15 mg/dL (ref 7–25)
CO2: 26 mmol/L (ref 20–32)
Calcium: 9 mg/dL (ref 8.6–10.4)
Chloride: 110 mmol/L (ref 98–110)
Creat: 0.88 mg/dL (ref 0.60–1.00)
Globulin: 2.5 g/dL (ref 1.9–3.7)
Glucose, Bld: 104 mg/dL — ABNORMAL HIGH (ref 65–99)
Potassium: 4 mmol/L (ref 3.5–5.3)
Sodium: 142 mmol/L (ref 135–146)
Total Bilirubin: 0.2 mg/dL (ref 0.2–1.2)
Total Protein: 6.1 g/dL (ref 6.1–8.1)

## 2024-01-02 LAB — CBC WITH DIFFERENTIAL/PLATELET
Absolute Lymphocytes: 2424 {cells}/uL (ref 850–3900)
Absolute Monocytes: 391 {cells}/uL (ref 200–950)
Basophils Absolute: 31 {cells}/uL (ref 0–200)
Basophils Relative: 0.5 %
Eosinophils Absolute: 161 {cells}/uL (ref 15–500)
Eosinophils Relative: 2.6 %
HCT: 38.4 % (ref 35.0–45.0)
Hemoglobin: 12.1 g/dL (ref 11.7–15.5)
MCH: 31 pg (ref 27.0–33.0)
MCHC: 31.5 g/dL — ABNORMAL LOW (ref 32.0–36.0)
MCV: 98.5 fL (ref 80.0–100.0)
MPV: 10.7 fL (ref 7.5–12.5)
Monocytes Relative: 6.3 %
Neutro Abs: 3193 {cells}/uL (ref 1500–7800)
Neutrophils Relative %: 51.5 %
Platelets: 258 10*3/uL (ref 140–400)
RBC: 3.9 10*6/uL (ref 3.80–5.10)
RDW: 12 % (ref 11.0–15.0)
Total Lymphocyte: 39.1 %
WBC: 6.2 10*3/uL (ref 3.8–10.8)

## 2024-01-02 NOTE — Progress Notes (Signed)
 CBC and CMP are normal.

## 2024-01-03 ENCOUNTER — Other Ambulatory Visit: Payer: Self-pay

## 2024-01-03 MED ORDER — DENOSUMAB 60 MG/ML ~~LOC~~ SOSY
60.0000 mg | PREFILLED_SYRINGE | Freq: Once | SUBCUTANEOUS | Status: AC
  Administered 2024-01-13: 60 mg via SUBCUTANEOUS

## 2024-01-03 NOTE — Telephone Encounter (Signed)
 Labs from 01/01/2024 wnl. Rx for Prolia sent to [facility].   Appt scheduled for 01/08/2024. She confirms that she has already paid the $300 copay now.  Chesley Mires, PharmD, MPH, BCPS, CPP Clinical Pharmacist (Rheumatology and Pulmonology)

## 2024-01-06 ENCOUNTER — Other Ambulatory Visit: Payer: Self-pay

## 2024-01-07 ENCOUNTER — Other Ambulatory Visit: Payer: Self-pay

## 2024-01-07 NOTE — Progress Notes (Signed)
 Patient contacted the specialty pharmacy regarding her prolia prescription from the Rheumatology clinic. Patient stated that her PCP is able to do a medical benefit at a $0 cost rather than the $300 pharmacy benefit cost that was already paid. Patient is to follow up with her PCP to verify this and to follow up with the Rheumatology clinic as well.

## 2024-01-07 NOTE — Progress Notes (Deleted)
 Pharmacy Note  Subjective:   Patient presents to clinic today to receive bi-annual dose of Prolia. Patient's last dose of Prolia was on 06/04/2023  Patient running a fever or have signs/symptoms of infection? {yes/no:20286}  Patient currently on antibiotics for the treatment of infection? {yes/no:20286}  Patient had fall in the last 6 months?  {yes/no:20286}  If yes, did it require medical attention? {yes/no:20286}   Patient taking calcium 1200 mg daily through diet or supplement and at least 800 units vitamin D? {yes/no:20286}  Objective: CMP     Component Value Date/Time   NA 142 01/01/2024 1346   K 4.0 01/01/2024 1346   CL 110 01/01/2024 1346   CO2 26 01/01/2024 1346   GLUCOSE 104 (H) 01/01/2024 1346   BUN 15 01/01/2024 1346   CREATININE 0.88 01/01/2024 1346   CALCIUM 9.0 01/01/2024 1346   PROT 6.1 01/01/2024 1346   ALBUMIN 3.7 11/30/2021 1133   AST 12 01/01/2024 1346   ALT 6 01/01/2024 1346   ALKPHOS 54 11/30/2021 1133   BILITOT 0.2 01/01/2024 1346   GFRNONAA 58 (L) 11/30/2021 1133   GFRNONAA 73 11/30/2020 1132   GFRAA 85 11/30/2020 1132    CBC    Component Value Date/Time   WBC 6.2 01/01/2024 1346   RBC 3.90 01/01/2024 1346   HGB 12.1 01/01/2024 1346   HCT 38.4 01/01/2024 1346   PLT 258 01/01/2024 1346   MCV 98.5 01/01/2024 1346   MCH 31.0 01/01/2024 1346   MCHC 31.5 (L) 01/01/2024 1346   RDW 12.0 01/01/2024 1346   LYMPHSABS 2,057 02/20/2023 1538   MONOABS 0.3 09/27/2009 2128   EOSABS 161 01/01/2024 1346   BASOSABS 31 01/01/2024 1346    Lab Results  Component Value Date   VD25OH 42 05/27/2023   T-score: January 02, 2022 DEXA scan T score -1.9, BMD 0.642 AP spine 4% improvement   Assessment/Plan:   Reviewed importance of adequate dietary intake of calcium in addition to supplementation due to risk of hypocalcemia with Prolia.   Patient tolerated injection ***.   Administration details as below:   Patient's next Prolia dose is due on  07/06/2024.  Patient is due for updated DEXA. DEXA order placed for [facility] today.   All questions encouraged and answered.  Instructed patient to call with any further questions or concerns.

## 2024-01-08 ENCOUNTER — Ambulatory Visit: Payer: Medicare HMO | Admitting: Pharmacist

## 2024-01-13 ENCOUNTER — Ambulatory Visit: Payer: Self-pay | Attending: Rheumatology | Admitting: Pharmacist

## 2024-01-13 DIAGNOSIS — M810 Age-related osteoporosis without current pathological fracture: Secondary | ICD-10-CM

## 2024-01-13 DIAGNOSIS — Z7689 Persons encountering health services in other specified circumstances: Secondary | ICD-10-CM

## 2024-01-13 MED ORDER — DENOSUMAB 60 MG/ML ~~LOC~~ SOSY
60.0000 mg | PREFILLED_SYRINGE | SUBCUTANEOUS | Status: AC
  Administered 2024-07-13: 60 mg via SUBCUTANEOUS

## 2024-01-13 NOTE — Progress Notes (Signed)
 Pharmacy Note  Subjective:   Patient presents to clinic today to receive bi-annual dose of Prolia. Patient's last dose of Prolia was on 06/04/2023  Patient running a fever or have signs/symptoms of infection? No  Patient currently on antibiotics for the treatment of infection? No  Patient had fall in the last 6 months?  No  I. She is using a walking stick to assist with ambulation  Patient taking calcium 1200 mg daily through diet or supplement and at least 800 units vitamin D? Yes  Objective: CMP     Component Value Date/Time   NA 142 01/01/2024 1346   K 4.0 01/01/2024 1346   CL 110 01/01/2024 1346   CO2 26 01/01/2024 1346   GLUCOSE 104 (H) 01/01/2024 1346   BUN 15 01/01/2024 1346   CREATININE 0.88 01/01/2024 1346   CALCIUM 9.0 01/01/2024 1346   PROT 6.1 01/01/2024 1346   ALBUMIN 3.7 11/30/2021 1133   AST 12 01/01/2024 1346   ALT 6 01/01/2024 1346   ALKPHOS 54 11/30/2021 1133   BILITOT 0.2 01/01/2024 1346   GFRNONAA 58 (L) 11/30/2021 1133   GFRNONAA 73 11/30/2020 1132   GFRAA 85 11/30/2020 1132    CBC    Component Value Date/Time   WBC 6.2 01/01/2024 1346   RBC 3.90 01/01/2024 1346   HGB 12.1 01/01/2024 1346   HCT 38.4 01/01/2024 1346   PLT 258 01/01/2024 1346   MCV 98.5 01/01/2024 1346   MCH 31.0 01/01/2024 1346   MCHC 31.5 (L) 01/01/2024 1346   RDW 12.0 01/01/2024 1346   LYMPHSABS 2,057 02/20/2023 1538   MONOABS 0.3 09/27/2009 2128   EOSABS 161 01/01/2024 1346   BASOSABS 31 01/01/2024 1346    Lab Results  Component Value Date   VD25OH 42 05/27/2023   T-score: January 02, 2022 DEXA scan T score -1.9, BMD 0.642 AP spine 4% improvement   Assessment/Plan:   Reviewed importance of adequate dietary intake of calcium in addition to supplementation due to risk of hypocalcemia with Prolia.   Patient tolerated injection well.   Administration details as below: Administrations This Visit     denosumab (PROLIA) injection 60 mg     Admin Date 01/13/2024  Action Given Dose 60 mg Route Subcutaneous Documented By Murrell Redden, RPH-CPP            Patient's next Prolia dose is due on 07/11/2024.  Patient is due for updated DEXA. DEXA order placed for [facility] today.   All questions encouraged and answered.  Instructed patient to call with any further questions or concerns.   Chesley Mires, PharmD, MPH, BCPS, CPP Clinical Pharmacist (Rheumatology and Pulmonology)

## 2024-01-13 NOTE — Patient Instructions (Signed)
 Order for bone density placed today for [facility].

## 2024-01-27 LAB — HM DEXA SCAN

## 2024-01-30 ENCOUNTER — Telehealth: Payer: Self-pay | Admitting: *Deleted

## 2024-01-30 NOTE — Telephone Encounter (Signed)
 Received DEXA results from [facility].  Date of Scan: 01/27/2024  Lowest T-score: -2.3  BMD: 0.591  Lowest site measured:Right Femoral Neck  DX: Osteopenia  Significant changes in BMD and site measured (5% and above): -6% Right Total Femur  Current Regimen: Vitamin D, Prolia Last injection 01/13/2024  Recommendation: Discuss at Follow up visit.   Reviewed by: Dr. Pollyann Savoy   Next Appointment:  03/19/2024

## 2024-02-07 ENCOUNTER — Other Ambulatory Visit: Payer: Self-pay | Admitting: Physician Assistant

## 2024-02-07 NOTE — Telephone Encounter (Signed)
 Last Fill: 12/06/2023  Next Visit: 03/19/2024  Last Visit: 09/19/2023  Dx:  Fibromyalgia   Current Dose per office note on 09/19/2023: not discussed  Okay to refill Topamax?

## 2024-02-10 ENCOUNTER — Encounter: Payer: Self-pay | Admitting: Rheumatology

## 2024-02-13 ENCOUNTER — Other Ambulatory Visit: Payer: Self-pay | Admitting: Rheumatology

## 2024-02-13 NOTE — Telephone Encounter (Signed)
 Last Fill: 01/01/2024   UDS:09/19/2023 UDS is consistent with tramadol use.    Narc Agreement: 09/19/2023   Next Visit: 03/19/2024   Last Visit: 09/19/2023   Dx: Fibromyalgia    Current Dose per office note on 09/19/2023: Tramadol 50 mg 1 to 2 tablets p.o. twice daily as needed      Okay to refill Tramadol?

## 2024-03-12 NOTE — Progress Notes (Signed)
 Office Visit Note  Patient: Paula Murphy             Date of Birth: Jul 26, 1946           MRN: [id number]             PCP: Rosslyn Coons, MD Referring: Rosslyn Coons, MD Visit Date: 03/19/2024 Occupation: @GUAROCC @  Subjective:  Left hip pain  History of Present Illness: Paula Murphy is a 78 y.o. female with undifferentiated connective tissue disease, osteoarthritis, degenerative disc disease, osteoporosis and fibromyalgia syndrome.  She states she has been having increased pain and discomfort all over.  She states since she had COVID-19  vaccine she has been having increased pain and discomfort.  She complains of discomfort in her both hands, left trochanteric bursa, left knee joint and her feet.  She continues to have neck pain.  She has generalized pain and discomfort from fibromyalgia.  She wants to have left trochanteric bursa injection.  Last left trochanteric bursa injection was in November 2024.  She had good relief from the last injection.  She has been getting Prolia  injections for osteoporosis.  Last Prolia  injection was on January 13, 2024.    Activities of Daily Living:  Patient reports morning stiffness for all day. Patient Reports nocturnal pain.  Difficulty dressing/grooming: Denies Difficulty climbing stairs: Reports Difficulty getting out of chair: Reports Difficulty using hands for taps, buttons, cutlery, and/or writing: Reports  Review of Systems  Constitutional:  Positive for fatigue.  HENT:  Negative for mouth sores and mouth dryness.   Eyes:  Negative for dryness.  Respiratory:  Negative for shortness of breath.   Cardiovascular:  Negative for chest pain and palpitations.  Gastrointestinal:  Negative for blood in stool, constipation and diarrhea.  Endocrine: Negative for increased urination.  Genitourinary:  Negative for involuntary urination.  Musculoskeletal:  Positive for joint pain, gait problem, joint pain, myalgias, muscle weakness, morning  stiffness, muscle tenderness and myalgias. Negative for joint swelling.  Skin:  Positive for color change. Negative for rash, hair loss and sensitivity to sunlight.  Allergic/Immunologic: Negative for susceptible to infections.  Neurological:  Negative for dizziness and headaches.  Hematological:  Negative for swollen glands.  Psychiatric/Behavioral:  Positive for sleep disturbance. Negative for depressed mood. The patient is not nervous/anxious.     PMFS History:  Patient Active Problem List   Diagnosis Date Noted   OP (osteoporosis) 12/26/2023   Primary osteoarthritis of left knee 10/10/2020   Ingrown toenail 02/02/2019   Primary osteoarthritis of both feet 03/21/2017   History of macular degeneration 03/21/2017   Other fatigue 03/21/2017   Age-related osteoporosis without current pathological fracture 03/21/2017   Esophageal reflux 06/29/2013   Actinic keratosis 04/11/2013   OA (osteoarthritis) of knee 03/02/2013   DEPRESSION 07/24/2010   Major depressive disorder, single episode, unspecified 07/24/2010   UTI 07/04/2010   ABDOMINAL BLOATING 01/20/2010   Fibromyalgia 10/04/2009   INSOMNIA, CHRONIC 10/04/2009   Autoimmune disease (HCC) 10/04/2009   Vitamin D  deficiency 10/26/2008   Hypothyroidism 08/13/2008   HYPERLIPIDEMIA 08/13/2008   Essential hypertension 08/13/2008   Headache 08/13/2008   Muscle pain 08/12/2008    Past Medical History:  Diagnosis Date   Arthritis    OA AND PAIN RT KNEE   Autoimmune disease (HCC)    + ANA, and DS DNA--PT STATES SHE WAS TOLD SHE DOES NOT HAVE LUPUS AS PREVIOUSLY THOUGHT   Fibromyalgia    followed by Dr. Alvira Josephs   Hemorrhoids  Hyperlipidemia    Hypothyroidism    Kidney stone    Macular degeneration    BOTH EYES   PONV (postoperative nausea and vomiting)    Thyroid  disease    Hypothyroidism    Family History  Problem Relation Age of Onset   Stroke Mother    Hypertension Mother    Hyperlipidemia Mother    Cancer Mother         Lung   Alzheimer's disease Mother    COPD Mother    Stroke Father    Hypertension Father    Diabetes Father        Type II   Hyperlipidemia Father    Cancer Father        Lung   Arthritis Father        Rheumatoid   COPD Father    COPD Brother    Other Brother        back surgery   Thyroid  disease Daughter    Breast cancer Cousin    Breast cancer Cousin    Past Surgical History:  Procedure Laterality Date   ABDOMINAL HYSTERECTOMY  11/06/1987   BUNIONECTOMY Left 11/05/2010   CHOLECYSTECTOMY  11/05/1994   EYE SURGERY     BILATERAL CATARACT EXTRACTION   KNEE ARTHROSCOPY  11/05/1992   Right Knee   KNEE SURGERY     NECK SURGERY  11/05/2001   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   on face   TONSILLECTOMY  11/06/1951   TOTAL KNEE ARTHROPLASTY Right 03/02/2013   Procedure: RIGHT TOTAL KNEE ARTHROPLASTY;  Surgeon: Aurther Blue, MD;  Location: [facility];  Service: Orthopedics;  Laterality: Right;   TOTAL KNEE ARTHROPLASTY Left 10/10/2020   Procedure: TOTAL KNEE ARTHROPLASTY;  Surgeon: Liliane Rei, MD;  Location: [facility];  Service: Orthopedics;  Laterality: Left;    Social History   Social History Narrative   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  Constitutional:      Appearance: She is well-developed.  HENT:     Head:  Normocephalic and atraumatic.  Eyes:     Conjunctiva/sclera: Conjunctivae normal.  Cardiovascular:     Rate and Rhythm: Normal rate and regular rhythm.     Heart sounds: Normal heart sounds.  Pulmonary:     Effort: Pulmonary effort is normal.     Breath sounds: Normal breath sounds.  Abdominal:     General: Bowel sounds are normal.     Palpations: Abdomen is soft.  Musculoskeletal:     Cervical back: Normal range of motion.  Lymphadenopathy:     Cervical: No cervical adenopathy.  Skin:    General: Skin is warm and dry.     Capillary Refill: Capillary refill takes less than 2 seconds.  Neurological:     Mental Status: She is alert and oriented to person, place, and  time.  Psychiatric:        Behavior: Behavior normal.      Musculoskeletal Exam: She had limited lateral rotation of the cervical spine.  Shoulder joints, elbow joints, wrist joints, MCPs PIPs and DIPs with good range of motion with no synovitis.  Hip joints and knee joints with good range of motion without any warmth swelling or effusion.  She had tenderness over bilateral trochanteric bursa.  There was no tenderness over ankles or MTPs.  She had generalized hyperalgesia and positive tender points.  CDAI Exam: CDAI Score: -- Patient Global: --; Provider Global: -- Swollen: --; Tender: -- Joint Exam 03/19/2024   No joint exam has been documented for this visit   There is currently no information documented on the homunculus. Go to the Rheumatology activity and complete the homunculus joint exam.  Investigation: No additional findings.  Imaging: No results found.  Recent Labs: Lab Results  Component Value Date   WBC 6.2 01/01/2024   HGB 12.1 01/01/2024   PLT 258 01/01/2024   NA 142 01/01/2024   K 4.0 01/01/2024   CL 110 01/01/2024   CO2 26 01/01/2024   GLUCOSE 104 (H) 01/01/2024   BUN 15 01/01/2024   CREATININE 0.88 01/01/2024   BILITOT 0.2 01/01/2024   ALKPHOS 54 11/30/2021   AST 12 01/01/2024    ALT 6 01/01/2024   PROT 6.1 01/01/2024   ALBUMIN 3.7 11/30/2021   CALCIUM 9.0 01/01/2024   GFRAA 85 11/30/2020    Speciality Comments: PLQ Eye Exam: 08/22/2022 WNL @ [facility] Follow up in 1 year  Prolia : 02/07/18, 08/19/18, 10/12/19, 04/11/20, 12/06/20, 06/04/21,12/06/2021, 06/08/22, 12/05/22, 06/04/23  Procedures:  Large Joint Inj: bilateral greater trochanter on 03/19/2024 11:54 AM Indications: pain Details: 27 G 1.5 in needle, lateral approach  Arthrogram: No  Medications (Right): 1.5 mL lidocaine  1 %; 40 mg triamcinolone  acetonide 40 MG/ML Aspirate (Right): 0 mL Medications (Left): 1.5 mL lidocaine  1 %; 40 mg triamcinolone  acetonide 40 MG/ML Aspirate (Left): 0 mL Outcome: tolerated well, no immediate complications  Risk of infection, tendon injury, nerve injury, hypopigmentation and dermal atrophy were discussed. Procedure, treatment alternatives, risks and benefits explained, specific risks discussed. Consent was given by the patient. Immediately prior to procedure a time out was called to verify the correct patient, procedure, equipment, support staff and site/side marked as required. Patient was prepped and draped in the usual sterile fashion.     Allergies: Codeine, Hydrocodone, and Plaquenil  [hydroxychloroquine ]   Assessment / Plan:     Visit Diagnoses: Undifferentiated connective tissue disease (HCC) - positive ANA, positive double-stranded DNA, fatigue, arthralgias. - Patient continues to have fatigue, arthralgias and mild Raynaud's symptoms.  She had good capillary refill with no nailbed capillary changes.  There was no evidence of malar rash or lymphadenopathy.  No synovitis was noted.  I will recheck labs today.  Labs obtained on September 19, 2023 double-stranded DNA remains positive.  Complements and sed rate were normal.  Plan: Protein / creatinine ratio, urine, Anti-DNA antibody, double-stranded, C3 and C4, Sedimentation rate, ANA  High risk medication use - (Plaquenil   200 mg p.o. twice daily Monday to Friday started October 2023-till October 2024 and then discontinued PLQ Eye Exam: 08/22/2022.  Primary osteoarthritis of both hands-she had no synovitis or tenderness on the examination.  Although she continues to have discomfort in her hands.  Which is most likely due to underlying fibromyalgia.  Trochanteric bursitis of both hips -she has severe tenderness over bilateral trochanteric bursa.  She is having difficulty walking and sleeping at night.  Patient requested bilateral cortisone injections.  Side effects of cortisone injections were discussed at length.  Bilateral trochanteric bursa were injected as described above.  Patient tolerated the procedure well.  Plan: Large Joint Inj: bilateral greater trochanter  Status post total bilateral knee replacement -she continues to have some discomfort.  Right total knee replacement February 03, 2013, left total knee replaced in October 10, 2000 Dr. Rossie Coon.  Primary osteoarthritis of both feet-proper fitting shoes were advised.  DDD (degenerative disc disease), cervical -she had limited range of motion with some discomfort.  Status post ACDF C5-C7.  Age-related osteoporosis without current pathological fracture [M81.0] - January 02, 2022 DEXA scan T score -1.9, BMD 0.642 AP spine 4% improvement.  She has been on Prolia  since April 2019.  Last Prolia  injection: 01/13/2024.  DEXA scan from January 27, 2024 was reviewed.  Right femoral neck BMD 0.591, T-score -2.9.  There was -6% decrease in total femur BMD.  Will continue Prolia  for now.  Calcium rich diet and vitamin D  was advised.  Regular exercise was advised.  Fibromyalgia -she continues to have generalized pain and discomfort from fibromyalgia.  She is on tramadol  50 mg 1 to 2 tablets p.o. twice daily as needed. referred to pain management at the last visit but patient canceled the appointment.  She may benefit from physical therapy.  I will refer her to integrative  therapies.- Plan: Ambulatory referral to Physical Therapy  Encounter for chronic pain management - UDS and narcotic agreement: 09/19/2023  Medication monitoring encounter - Plan: DRUG MONITOR, TRAMADOL ,QN, URINE, DRUG MONITOR, PANEL 5, W/CONF, URINE  Vitamin D  deficiency-vitamin D  was normal at 42 on May 27, 2023.  Other fatigue-related to the myalgia and insomnia.  Primary insomnia-good sleep hygiene was discussed.  Other medical problems are listed as follows:  History of hyperlipidemia  Lymphedema  History of anxiety  History of macular degeneration  History of hypertension  History of hypothyroidism  History of depression  Orders: Orders Placed This Encounter  Procedures   Large Joint Inj: bilateral greater trochanter   DRUG MONITOR, TRAMADOL ,QN, URINE   DRUG MONITOR, PANEL 5, W/CONF, URINE   Protein / creatinine ratio, urine   Anti-DNA antibody, double-stranded   C3 and C4   Sedimentation rate   ANA   Ambulatory referral to Physical Therapy   No orders of the defined types were placed in this encounter.   Face-to-face time spent with patient was over 30 minutes. Greater than 50% of time was spent in counseling and coordination of care.  Follow-Up Instructions: Return in about 4 months (around 07/20/2024) for UCTD, OA, OP.   Nicholas Bari, MD  Note - This record has been created using Animal nutritionist.  Chart creation errors have been sought, but may not always  have been located. Such creation errors do not reflect on  the standard of medical care.

## 2024-03-19 ENCOUNTER — Encounter: Payer: Self-pay | Admitting: Rheumatology

## 2024-03-19 ENCOUNTER — Ambulatory Visit: Payer: Medicare HMO | Attending: Rheumatology | Admitting: Rheumatology

## 2024-03-19 VITALS — BP 120/74 | HR 81 | Resp 13 | Ht 60.0 in | Wt 155.4 lb

## 2024-03-19 DIAGNOSIS — M359 Systemic involvement of connective tissue, unspecified: Secondary | ICD-10-CM

## 2024-03-19 DIAGNOSIS — F5101 Primary insomnia: Secondary | ICD-10-CM

## 2024-03-19 DIAGNOSIS — M7061 Trochanteric bursitis, right hip: Secondary | ICD-10-CM

## 2024-03-19 DIAGNOSIS — G8929 Other chronic pain: Secondary | ICD-10-CM

## 2024-03-19 DIAGNOSIS — Z5181 Encounter for therapeutic drug level monitoring: Secondary | ICD-10-CM

## 2024-03-19 DIAGNOSIS — Z8659 Personal history of other mental and behavioral disorders: Secondary | ICD-10-CM

## 2024-03-19 DIAGNOSIS — Z79899 Other long term (current) drug therapy: Secondary | ICD-10-CM | POA: Diagnosis not present

## 2024-03-19 DIAGNOSIS — M7062 Trochanteric bursitis, left hip: Secondary | ICD-10-CM

## 2024-03-19 DIAGNOSIS — Z8639 Personal history of other endocrine, nutritional and metabolic disease: Secondary | ICD-10-CM

## 2024-03-19 DIAGNOSIS — M19041 Primary osteoarthritis, right hand: Secondary | ICD-10-CM

## 2024-03-19 DIAGNOSIS — I890 Lymphedema, not elsewhere classified: Secondary | ICD-10-CM

## 2024-03-19 DIAGNOSIS — R5383 Other fatigue: Secondary | ICD-10-CM

## 2024-03-19 DIAGNOSIS — M19071 Primary osteoarthritis, right ankle and foot: Secondary | ICD-10-CM

## 2024-03-19 DIAGNOSIS — M797 Fibromyalgia: Secondary | ICD-10-CM

## 2024-03-19 DIAGNOSIS — M810 Age-related osteoporosis without current pathological fracture: Secondary | ICD-10-CM

## 2024-03-19 DIAGNOSIS — M5030 Other cervical disc degeneration, unspecified cervical region: Secondary | ICD-10-CM

## 2024-03-19 DIAGNOSIS — Z96653 Presence of artificial knee joint, bilateral: Secondary | ICD-10-CM

## 2024-03-19 DIAGNOSIS — M19042 Primary osteoarthritis, left hand: Secondary | ICD-10-CM

## 2024-03-19 DIAGNOSIS — M19072 Primary osteoarthritis, left ankle and foot: Secondary | ICD-10-CM

## 2024-03-19 DIAGNOSIS — Z8669 Personal history of other diseases of the nervous system and sense organs: Secondary | ICD-10-CM

## 2024-03-19 DIAGNOSIS — E559 Vitamin D deficiency, unspecified: Secondary | ICD-10-CM

## 2024-03-19 DIAGNOSIS — Z8679 Personal history of other diseases of the circulatory system: Secondary | ICD-10-CM

## 2024-03-19 MED ORDER — LIDOCAINE HCL 1 % IJ SOLN
1.5000 mL | INTRAMUSCULAR | Status: AC | PRN
  Administered 2024-03-19: 1.5 mL

## 2024-03-19 MED ORDER — TRIAMCINOLONE ACETONIDE 40 MG/ML IJ SUSP
40.0000 mg | INTRAMUSCULAR | Status: AC | PRN
  Administered 2024-03-19: 40 mg via INTRA_ARTICULAR

## 2024-03-20 ENCOUNTER — Ambulatory Visit: Payer: Self-pay | Admitting: Rheumatology

## 2024-03-20 LAB — DM TEMPLATE

## 2024-03-20 NOTE — Progress Notes (Signed)
 Protein creatinine ratio was normal, sed rate normal, complements normal.  ANA and double-stranded ENA are pending.

## 2024-03-21 LAB — DRUG MONITOR, PANEL 5, W/CONF, URINE
Amphetamines: NEGATIVE ng/mL (ref <500)
Barbiturates: NEGATIVE ng/mL (ref <300)
Benzodiazepines: NEGATIVE ng/mL (ref <100)
Cocaine Metabolite: NEGATIVE ng/mL (ref <150)
Creatinine: 93.3 mg/dL (ref >=20.0)
Marijuana Metabolite: NEGATIVE ng/mL (ref <20)
Methadone Metabolite: NEGATIVE ng/mL (ref <100)
Opiates: NEGATIVE ng/mL (ref <100)
Oxidant: NEGATIVE ug/mL (ref <200)
Oxycodone: NEGATIVE ng/mL (ref <100)
pH: 7.2 (ref 4.5–9.0)

## 2024-03-21 LAB — ANTI-NUCLEAR AB-TITER (ANA TITER)
ANA TITER: 1:40 {titer} — ABNORMAL HIGH
ANA TITER: 1:80 {titer} — ABNORMAL HIGH
ANA Titer 1: 1:80 {titer} — ABNORMAL HIGH

## 2024-03-21 LAB — DM TEMPLATE

## 2024-03-21 LAB — ANA: Anti Nuclear Antibody (ANA): POSITIVE — AB

## 2024-03-21 LAB — DRUG MONITOR, TRAMADOL,QN, URINE
Desmethyltramadol: >10000 ng/mL — ABNORMAL HIGH (ref <100)
Tramadol: 6791 ng/mL — ABNORMAL HIGH (ref <100)

## 2024-03-21 LAB — PROTEIN / CREATININE RATIO, URINE
Creatinine, Urine: 98 mg/dL (ref 20–275)
Protein/Creat Ratio: 122 mg/g{creat} (ref 24–184)
Protein/Creatinine Ratio: 0.122 mg/mg{creat} (ref 0.024–0.184)
Total Protein, Urine: 12 mg/dL (ref 5–24)

## 2024-03-21 LAB — C3 AND C4
C3 Complement: 136 mg/dL (ref 83–193)
C4 Complement: 22 mg/dL (ref 15–57)

## 2024-03-21 LAB — ANTI-DNA ANTIBODY, DOUBLE-STRANDED: ds DNA Ab: 68 [IU]/mL — ABNORMAL HIGH

## 2024-03-21 LAB — SEDIMENTATION RATE: Sed Rate: 2 mm/h (ref 0–30)

## 2024-03-22 NOTE — Progress Notes (Signed)
 UDS positive for tramadol  use, double-stranded DNA remains elevated, ANA remains positive.  Will continue to monitor labs.

## 2024-04-15 ENCOUNTER — Other Ambulatory Visit: Payer: Self-pay | Admitting: Rheumatology

## 2024-04-15 NOTE — Telephone Encounter (Signed)
 Last Fill: 02/07/2024  Next Visit: 09/23/2024  Last Visit: 03/19/2024  Dx: Fibromyalgia   Current Dose per office note on 03/19/2024:   Okay to refill Topamax ?

## 2024-04-17 ENCOUNTER — Other Ambulatory Visit: Payer: Self-pay | Admitting: Rheumatology

## 2024-04-20 NOTE — Telephone Encounter (Signed)
 Last Fill: 02/13/2024  UDS: 03/19/2024 UDS is consistent with tramadol  use.  Narc Agreement: 03/26/2024  Next Visit: 09/23/2024  Last Visit: 03/19/2024  Dx: Fibromyalgia   Current Dose per office note on 03/19/2024: tramadol  50 mg 1 to 2 tablets p.o. twice daily as needed    Okay to refill Tramadol  ?

## 2024-05-26 ENCOUNTER — Other Ambulatory Visit: Payer: Self-pay | Admitting: Rheumatology

## 2024-05-26 NOTE — Telephone Encounter (Signed)
 Last Fill: 04/20/2024   UDS: 03/19/2024 UDS is consistent with tramadol  use.   Narc Agreement: 03/26/2024   Next Visit: 09/23/2024   Last Visit: 03/19/2024   Dx: Fibromyalgia    Current Dose per office note on 03/19/2024: tramadol  50 mg 1 to 2 tablets p.o. twice daily as needed      Okay to refill Tramadol  ?

## 2024-05-27 ENCOUNTER — Other Ambulatory Visit: Payer: Self-pay | Admitting: Endocrinology

## 2024-05-27 DIAGNOSIS — Z1231 Encounter for screening mammogram for malignant neoplasm of breast: Secondary | ICD-10-CM

## 2024-06-12 LAB — LAB REPORT - SCANNED: EGFR: 69.6

## 2024-06-18 ENCOUNTER — Other Ambulatory Visit: Payer: Self-pay | Admitting: Rheumatology

## 2024-06-21 ENCOUNTER — Other Ambulatory Visit: Payer: Self-pay | Admitting: Rheumatology

## 2024-06-22 ENCOUNTER — Other Ambulatory Visit: Payer: Self-pay

## 2024-06-22 MED ORDER — TRAMADOL HCL 50 MG PO TABS: ORAL_TABLET | ORAL | 0 refills | Status: DC

## 2024-06-22 NOTE — Telephone Encounter (Signed)
 Last Fill: 05/26/2024  UDS:03/19/2024 UDS positive for tramadol  use, double-stranded DNA remains elevated, ANA remains positive.   Narc Agreement: 03/19/2024  Next Visit: 09/23/2024  Last Visit: 03/19/2024  Dx: Fibromyalgia    Current Dose per office note on 03/19/2024: tramadol  50 mg 1 to 2 tablets p.o. twice daily as needed.    Okay to refill Tramadol ?

## 2024-06-25 ENCOUNTER — Telehealth: Payer: Self-pay

## 2024-06-25 NOTE — Telephone Encounter (Signed)
 Received fax from [facility] regarding tramadol  and alprazolam . Dr. Nichole prescribed alprazolam  0.5mg  1 tab po BID on 06/20/2024.   Dr. Dolphus reviewed the fax and patient should not take the two prescriptions at the same time and for future refills of tramadol , she should discuss with Dr. Nichole about taking the prescription over.  Patient verbalized understanding. Tramadol  rx has been discontinued on medication list, per Dr. Dolphus.

## 2024-06-26 ENCOUNTER — Telehealth: Payer: Self-pay

## 2024-06-26 ENCOUNTER — Ambulatory Visit
Admission: RE | Admit: 2024-06-26 | Discharge: 2024-06-26 | Disposition: A | Discharge status: Home / Self Care | Source: Ambulatory Visit | Attending: Endocrinology | Admitting: Endocrinology

## 2024-06-26 DIAGNOSIS — Z1231 Encounter for screening mammogram for malignant neoplasm of breast: Secondary | ICD-10-CM

## 2024-06-26 NOTE — Telephone Encounter (Signed)
 Labs received from:[facility]  Drawn on: 06/12/2024  Reviewed by: Dr. Dolphus  Labs drawn: Lipid, CMP, CBC, Vitamin D , TSH, T4, Vitamin B12  Results:  LDL/HDL ratio 1.6 BUN 25 Chloride 113  Vitamin D  47.7 TSH 0.080   Patient is on Prolia  every 6 months and topamax  150mg  daily.

## 2024-06-30 ENCOUNTER — Other Ambulatory Visit (HOSPITAL_COMMUNITY): Payer: Self-pay

## 2024-06-30 ENCOUNTER — Other Ambulatory Visit: Payer: Self-pay

## 2024-06-30 ENCOUNTER — Telehealth: Payer: Self-pay | Admitting: Pharmacist

## 2024-06-30 ENCOUNTER — Other Ambulatory Visit: Payer: Self-pay | Admitting: Pharmacy Technician

## 2024-06-30 DIAGNOSIS — Z5181 Encounter for therapeutic drug level monitoring: Secondary | ICD-10-CM

## 2024-06-30 DIAGNOSIS — E559 Vitamin D deficiency, unspecified: Secondary | ICD-10-CM

## 2024-06-30 DIAGNOSIS — Z79899 Other long term (current) drug therapy: Secondary | ICD-10-CM

## 2024-06-30 DIAGNOSIS — M810 Age-related osteoporosis without current pathological fracture: Secondary | ICD-10-CM

## 2024-06-30 MED ORDER — PROLIA 60 MG/ML ~~LOC~~ SOSY
60.0000 mg | PREFILLED_SYRINGE | SUBCUTANEOUS | 0 refills | Status: AC
  Filled 2024-06-30: qty 1, 180d supply, fill #0

## 2024-06-30 NOTE — Progress Notes (Signed)
 Specialty Pharmacy Refill Coordination Note  Paula Murphy is a 78 y.o. female assessed today regarding refills of clinic administered specialty medication(s) Denosumab  (PROLIA )   Clinic requested Courier to Provider Office   Delivery date: 07/07/24   Verified address: [facility] [address]   Medication will be filled on 07/03/24.

## 2024-06-30 NOTE — Telephone Encounter (Addendum)
 Patient due for Prolia  on 07/11/2024. Labs were updated on 06/12/2024 - to be sent to scan center. WNL  Per test claim, copay is $300. Patient states she'd like to come to clinic  Scheduled for Prolia  appt on 07/13/2024. Denies any upcoming invasive dental procedures/surgery. Prescription sent to [facility] to be couriered to clinic before appt  Kenedee Molesky, PharmD, MPH, BCPS, CPP Clinical Pharmacist (Rheumatology and Pulmonology)  ----- Message ----- From: Yvone Daved BROCKS, CMA Sent: 06/25/2024  10:31 AM EDT To: Rx [facility]  Patient states she received a call from [facility] that her Prolia  is due but is not sure who or where she received the call from.

## 2024-07-03 ENCOUNTER — Other Ambulatory Visit: Payer: Self-pay

## 2024-07-07 ENCOUNTER — Telehealth: Payer: Self-pay

## 2024-07-07 ENCOUNTER — Other Ambulatory Visit (HOSPITAL_COMMUNITY): Payer: Self-pay

## 2024-07-07 NOTE — Telephone Encounter (Signed)
 Patient called office to ask about the name change of the Prolia  an wanted to know if we was aware of it . Devki advised patient to bring the papers to her appointment on 07/13/2024 and she would go over it with patient. Patient verbalized understanding

## 2024-07-08 NOTE — Progress Notes (Deleted)
 Pharmacy Note  Subjective:   Patient presents to clinic today to receive bi-annual dose of Prolia . Patient's last dose of Prolia  was on ***  Patient running a fever or have signs/symptoms of infection? {yes/no:20286}  Patient currently on antibiotics for the treatment of infection? {yes/no:20286}  Patient had fall in the last 6 months?  {yes/no:20286}  If yes, did it require medical attention? {yes/no:20286}   Patient taking calcium 1200 mg daily through diet or supplement and at least 800 units vitamin D ? {yes/no:20286}  Objective: CMP     Component Value Date/Time   NA 142 01/01/2024 1346   K 4.0 01/01/2024 1346   CL 110 01/01/2024 1346   CO2 26 01/01/2024 1346   GLUCOSE 104 (H) 01/01/2024 1346   BUN 15 01/01/2024 1346   CREATININE 0.88 01/01/2024 1346   CALCIUM 9.0 01/01/2024 1346   PROT 6.1 01/01/2024 1346   ALBUMIN 3.7 11/30/2021 1133   AST 12 01/01/2024 1346   ALT 6 01/01/2024 1346   ALKPHOS 54 11/30/2021 1133   BILITOT 0.2 01/01/2024 1346   GFRNONAA 58 (L) 11/30/2021 1133   GFRNONAA 73 11/30/2020 1132   GFRAA 85 11/30/2020 1132    CBC    Component Value Date/Time   WBC 6.2 01/01/2024 1346   RBC 3.90 01/01/2024 1346   HGB 12.1 01/01/2024 1346   HCT 38.4 01/01/2024 1346   PLT 258 01/01/2024 1346   MCV 98.5 01/01/2024 1346   MCH 31.0 01/01/2024 1346   MCHC 31.5 (L) 01/01/2024 1346   RDW 12.0 01/01/2024 1346   LYMPHSABS 2,057 02/20/2023 1538   MONOABS 0.3 09/27/2009 2128   EOSABS 161 01/01/2024 1346   BASOSABS 31 01/01/2024 1346    Lab Results  Component Value Date   VD25OH 42 05/27/2023    T-score: ***  Assessment/Plan:   Reviewed importance of adequate dietary intake of calcium in addition to supplementation due to risk of hypocalcemia with Prolia .   Patient tolerated injection  ***.   Administration details as below:   Patient is to return in 10-14 days for labs to monitor for hypocalcemia.  Future orders placed.  Patient's next Prolia   dose is due on ***.  Patient is due for updated DEXA in ***.   All questions encouraged and answered.  Instructed patient to call with any further questions or concerns.   Sherry Pennant, PharmD, MPH, BCPS, CPP Clinical Pharmacist (Rheumatology and Pulmonology)

## 2024-07-13 ENCOUNTER — Ambulatory Visit: Admitting: Pharmacist

## 2024-07-13 ENCOUNTER — Ambulatory Visit: Attending: Rheumatology | Admitting: Pharmacist

## 2024-07-13 DIAGNOSIS — Z7689 Persons encountering health services in other specified circumstances: Secondary | ICD-10-CM

## 2024-07-13 DIAGNOSIS — M810 Age-related osteoporosis without current pathological fracture: Secondary | ICD-10-CM

## 2024-07-13 MED ORDER — DENOSUMAB 60 MG/ML ~~LOC~~ SOSY: 60.0000 mg | PREFILLED_SYRINGE | SUBCUTANEOUS | Status: AC

## 2024-07-13 NOTE — Progress Notes (Signed)
 Pharmacy Note  Subjective:   Patient presents to clinic today to receive bi-annual dose of Prolia . Patient's last dose of Prolia  was on 01/13/2024.  Patient presents with letter from [facility] indicating that [employer] is preferred denosumab  product.  Patient running a fever or have signs/symptoms of infection? No  Patient currently on antibiotics for the treatment of infection? No  Patient had fall in the last 6 months?  No   Patient taking calcium 1200 mg daily through diet or supplement and at least 800 units vitamin D ? Yes. She is not taking calcium riht now but wnl (labs drawn on 06/12/2024)  Objective: CMP drawn on 06/12/2024 with PCP reviewed  Calcium 9.1 mg/dL Vitamin D  47.7 ng/dL  T-score: January 02, 2022 DEXA scan T score -1.9, BMD 0.642 AP spine 4% improvement.  DEXA scan from January 27, 2024 was reviewed. Right femoral neck BMD 0.591, T-score -2.9. There was -6% decrease in total femur BMD.   Assessment/Plan:   Reviewed importance of adequate dietary intake of calcium in addition to supplementation due to risk of hypocalcemia with Prolia .   Patient tolerated injection well.   Administration details as below: Administrations This Visit     denosumab  (PROLIA ) injection 60 mg     Admin Date 07/13/2024 Action Given Dose 60 mg Route Subcutaneous Documented By Dayne Sherry RAMAN, RPH-CPP           Patient's next Prolia  dose is due on 01/09/2025.  Patient is due for updated DEXA in March 2027.   All questions encouraged and answered.  Instructed patient to call with any further questions or concerns.   Sherry Dayne, PharmD, MPH, BCPS, CPP Clinical Pharmacist (Rheumatology and Pulmonology)

## 2024-08-23 ENCOUNTER — Emergency Department (HOSPITAL_BASED_OUTPATIENT_CLINIC_OR_DEPARTMENT_OTHER)

## 2024-08-23 ENCOUNTER — Emergency Department (HOSPITAL_BASED_OUTPATIENT_CLINIC_OR_DEPARTMENT_OTHER)
Admission: EM | Admit: 2024-08-23 | Discharge: 2024-08-23 | Disposition: A | Discharge status: Home / Self Care | Attending: Emergency Medicine | Admitting: Emergency Medicine

## 2024-08-23 ENCOUNTER — Encounter (HOSPITAL_BASED_OUTPATIENT_CLINIC_OR_DEPARTMENT_OTHER): Payer: Self-pay

## 2024-08-23 ENCOUNTER — Other Ambulatory Visit: Payer: Self-pay

## 2024-08-23 DIAGNOSIS — R1111 Vomiting without nausea: Secondary | ICD-10-CM

## 2024-08-23 DIAGNOSIS — I671 Cerebral aneurysm, nonruptured: Secondary | ICD-10-CM | POA: Insufficient documentation

## 2024-08-23 DIAGNOSIS — I714 Abdominal aortic aneurysm, without rupture, unspecified: Secondary | ICD-10-CM | POA: Insufficient documentation

## 2024-08-23 DIAGNOSIS — N3000 Acute cystitis without hematuria: Secondary | ICD-10-CM | POA: Insufficient documentation

## 2024-08-23 DIAGNOSIS — R112 Nausea with vomiting, unspecified: Secondary | ICD-10-CM | POA: Diagnosis present

## 2024-08-23 LAB — COMPREHENSIVE METABOLIC PANEL WITH GFR
ALT: 8 U/L (ref 0–44)
AST: 18 U/L (ref 15–41)
Albumin: 4.1 g/dL (ref 3.5–5.0)
Alkaline Phosphatase: 67 U/L (ref 38–126)
Anion gap: 15 (ref 5–15)
BUN: 10 mg/dL (ref 8–23)
CO2: 20 mmol/L — ABNORMAL LOW (ref 22–32)
Calcium: 9.2 mg/dL (ref 8.9–10.3)
Chloride: 104 mmol/L (ref 98–111)
Creatinine, Ser: 0.64 mg/dL (ref 0.44–1.00)
GFR, Estimated: >60 mL/min (ref >60)
Glucose, Bld: 129 mg/dL — ABNORMAL HIGH (ref 70–99)
Potassium: 3.7 mmol/L (ref 3.5–5.1)
Sodium: 139 mmol/L (ref 135–145)
Total Bilirubin: 0.4 mg/dL (ref 0.0–1.2)
Total Protein: 7 g/dL (ref 6.5–8.1)

## 2024-08-23 LAB — CBC
HCT: 40.2 % (ref 36.0–46.0)
Hemoglobin: 13.4 g/dL (ref 12.0–15.0)
MCH: 31.5 pg (ref 26.0–34.0)
MCHC: 33.3 g/dL (ref 30.0–36.0)
MCV: 94.4 fL (ref 80.0–100.0)
Platelets: 220 K/uL (ref 150–400)
RBC: 4.26 MIL/uL (ref 3.87–5.11)
RDW: 13.2 % (ref 11.5–15.5)
WBC: 6.4 K/uL (ref 4.0–10.5)
nRBC: 0 % (ref 0.0–0.2)

## 2024-08-23 LAB — URINALYSIS, ROUTINE W REFLEX MICROSCOPIC
Bilirubin Urine: NEGATIVE
Glucose, UA: NEGATIVE mg/dL
Ketones, ur: >80 mg/dL — AB
Nitrite: POSITIVE — AB
Protein, ur: 30 mg/dL — AB
Specific Gravity, Urine: >1.046 — ABNORMAL HIGH (ref 1.005–1.030)
pH: 6.5 (ref 5.0–8.0)

## 2024-08-23 LAB — LIPASE, BLOOD: Lipase: <10 U/L — ABNORMAL LOW (ref 11–51)

## 2024-08-23 MED ORDER — SODIUM CHLORIDE 0.9 % IV BOLUS
1000.0000 mL | Freq: Once | INTRAVENOUS | Status: AC
  Administered 2024-08-23: 1000 mL via INTRAVENOUS

## 2024-08-23 MED ORDER — IOHEXOL 350 MG/ML SOLN
75.0000 mL | Freq: Once | INTRAVENOUS | Status: AC | PRN
  Administered 2024-08-23: 75 mL via INTRAVENOUS

## 2024-08-23 MED ORDER — PROCHLORPERAZINE EDISYLATE 10 MG/2ML IJ SOLN
5.0000 mg | Freq: Once | INTRAMUSCULAR | Status: AC
  Administered 2024-08-23: 5 mg via INTRAVENOUS
  Filled 2024-08-23: qty 2

## 2024-08-23 MED ORDER — PROCHLORPERAZINE MALEATE 5 MG PO TABS: 5.0000 mg | ORAL_TABLET | Freq: Two times a day (BID) | ORAL | 0 refills | Status: DC | PRN

## 2024-08-23 MED ORDER — CEPHALEXIN 250 MG PO CAPS
500.0000 mg | ORAL_CAPSULE | Freq: Once | ORAL | Status: AC
  Administered 2024-08-23: 500 mg via ORAL
  Filled 2024-08-23: qty 2

## 2024-08-23 MED ORDER — ONDANSETRON HCL 4 MG/2ML IJ SOLN
4.0000 mg | Freq: Once | INTRAMUSCULAR | Status: DC
  Filled 2024-08-23: qty 2

## 2024-08-23 MED ORDER — CEPHALEXIN 500 MG PO CAPS: 500.0000 mg | ORAL_CAPSULE | Freq: Three times a day (TID) | ORAL | 0 refills | Status: AC

## 2024-08-23 MED ORDER — DIPHENHYDRAMINE HCL 50 MG/ML IJ SOLN
12.5000 mg | Freq: Once | INTRAMUSCULAR | Status: AC
  Administered 2024-08-23: 12.5 mg via INTRAVENOUS
  Filled 2024-08-23: qty 1

## 2024-08-23 NOTE — Discharge Instructions (Addendum)
 Continue antibiotics for urinary tract infection.  Follow-up with your primary care doctor.  Continue Zofran  as needed for nausea.  Return if symptoms worsen.  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  This will need monitoring as well  Follow-up with Dr. Lanis with neurosurgery regarding your small aneurysm with finding on your CT scan of your head today.  This may need further monitoring as well.  Please return if you ever develop severe abdominal pain or severe headache.

## 2024-08-23 NOTE — ED Triage Notes (Signed)
 Patient reports vomiting and nausea since Friday. She reports taking Zofran  and it has not helped. She said she has tested negative for COVID and flu.

## 2024-08-23 NOTE — ED Provider Notes (Signed)
 [place] EMERGENCY DEPARTMENT AT [facility] Provider Note   CSN: [id number] Arrival date & time: 08/23/24  1928     Patient presents with: Nausea and Emesis   DENIYAH DILLAVOU is a 78 y.o. female.   Patient here with nausea and vomiting for the last 3 days.  Nothing makes it worse or better.  She has been taking Zofran  without much relief.  She tested negative for COVID and flu.  She has had a little bit of abdominal pain.  She has had some mild headaches as well.  She has a history of fibromyalgia, high cholesterol.  She denies any weakness numbness tingling.  No vision loss.  No speech changes.  She feels weak and rundown.  She denies any diarrhea.  Denies any sick contacts.  Has not noticed a fever.  She has a history of migraines as well.  She is not really having any major headache at this time.  But still feels really nauseous.  The history is provided by the patient.       Prior to Admission medications   Medication Sig Start Date End Date Taking? Authorizing Provider  cephALEXin  (KEFLEX ) 500 MG capsule Take 1 capsule (500 mg total) by mouth 3 (three) times daily for 5 days. 08/23/24 08/28/24 Yes Bard Haupert, DO  prochlorperazine (COMPAZINE) 5 MG tablet Take 1 tablet (5 mg total) by mouth 2 (two) times daily as needed for up to 10 doses for nausea or vomiting. 08/23/24  Yes Malyn Aytes, DO  amoxicillin (AMOXIL) 500 MG capsule Take 2,000 mg by mouth once. Patient not taking: Reported on 06/14/2023    [provider]  Bisacodyl  (LAXATIVE PO) Take 1 tablet by mouth daily. Vegetable    [provider]  Cholecalciferol (VITAMIN D3) 5000 units CAPS Take 5,000 Units by mouth daily.     [provider]  denosumab  (PROLIA ) 60 MG/ML SOSY injection Inject 60 mg into the skin every 6 (six) months. CALL PT FOR PAYMENT INFORMATION. Courier to rheum: [address], [address], [place] [address]. Appt on 07/13/2024 06/30/24   Dolphus Reiter, MD   escitalopram  (LEXAPRO ) 10 MG tablet Take 20 mg by mouth daily.    [provider]  ezetimibe (ZETIA) 10 MG tablet Take 10 mg by mouth daily.  12/18/16 03/19/24  [provider]  fluticasone (FLONASE) 50 MCG/ACT nasal spray Place 1 spray into both nostrils daily as needed for allergies.  08/21/16   [provider]  GEMTESA 75 MG TABS Take 1 tablet by mouth daily.    [provider]  ibuprofen  (ADVIL ) 800 MG tablet Take 1 tablet by mouth daily as needed. 02/13/24   [provider]  levothyroxine  (SYNTHROID ) 150 MCG tablet Take 150 mcg by mouth daily before breakfast. 03/16/22   [provider]  loperamide  (IMODIUM ) 2 MG capsule Take 1 capsule (2 mg total) by mouth 4 (four) times daily as needed for diarrhea or loose stools. 11/30/21   Long, Fonda MATSU, MD  methocarbamol  (ROBAXIN ) 500 MG tablet Take 500 mg by mouth at bedtime.    [provider]  Multiple Vitamins-Minerals (PRESERVISION AREDS) CAPS Take 1 capsule by mouth daily.    [provider]  NYSTATIN EX Apply topically as needed. Patient not taking: Reported on 03/19/2024    [provider]  ondansetron  (ZOFRAN -ODT) 4 MG disintegrating tablet Take 1 tablet (4 mg total) by mouth every 8 (eight) hours as needed. 11/30/21   Long, Fonda MATSU, MD  pantoprazole  (PROTONIX ) 40 MG tablet Take 40 mg by mouth daily. 09/07/20   [provider]  simethicone (MYLICON) 125 MG chewable tablet 125 mg daily as needed for flatulence.  05/27/14   [provider]  topiramate  (TOPAMAX ) 50 MG tablet TAKE 3 TABLETS BY MOUTH DAILY 04/15/24   Dolphus Reiter, MD  TRIAMCINOLONE  ACETONIDE EX Apply topically as needed.    [provider]  valACYclovir  (VALTREX ) 500 MG tablet Take 500 mg by mouth daily as needed (fever blister).  05/26/14   [provider]    Allergies: Codeine, Hydrocodone, and Plaquenil  [hydroxychloroquine ]    Review of Systems  Updated Vital  Signs BP (!) 157/71   Pulse 82   Temp 98.9 F (37.2 C)   Resp 18   SpO2 99%   Physical Exam Vitals and nursing note reviewed.  Constitutional:      General: She is not in acute distress.    Appearance: She is well-developed. She is not ill-appearing.  HENT:     Head: Normocephalic and atraumatic.     Nose: Nose normal.     Mouth/Throat:     Mouth: Mucous membranes are moist.  Eyes:     Extraocular Movements: Extraocular movements intact.     Conjunctiva/sclera: Conjunctivae normal.     Pupils: Pupils are equal, round, and reactive to light.  Cardiovascular:     Rate and Rhythm: Normal rate and regular rhythm.     Pulses: Normal pulses.     Heart sounds: Normal heart sounds. No murmur heard. Pulmonary:     Effort: Pulmonary effort is normal. No respiratory distress.     Breath sounds: Normal breath sounds.  Abdominal:     General: Abdomen is flat.     Palpations: Abdomen is soft.     Tenderness: There is no abdominal tenderness.  Musculoskeletal:        General: No swelling.     Cervical back: Normal range of motion and neck supple.  Skin:    General: Skin is warm and dry.     Capillary Refill: Capillary refill takes less than 2 seconds.  Neurological:     General: No focal deficit present.     Mental Status: She is alert and oriented to person, place, and time.     Cranial Nerves: No cranial nerve deficit.     Sensory: No sensory deficit.     Motor: No weakness.     Coordination: Coordination normal.     Comments: 5+ out of 5 strength throughout, normal sensation, no drift, normal finger-nose-finger, normal speech, normal visual fields  Psychiatric:        Mood and Affect: Mood normal.     (all labs ordered are listed, but only abnormal results are displayed) Labs Reviewed  LIPASE, BLOOD - Abnormal; Notable for the following components:      Result Value   Lipase <10 (*)    All other components within normal limits  COMPREHENSIVE METABOLIC PANEL WITH GFR -  Abnormal; Notable for the following components:   CO2 20 (*)    Glucose, Bld 129 (*)    All other components within normal limits  URINALYSIS, ROUTINE W REFLEX MICROSCOPIC - Abnormal; Notable for the following components:   Specific Gravity, Urine >1.046 (*)    Hgb urine dipstick MODERATE (*)    Ketones, ur >80 (*)    Protein, ur 30 (*)    Nitrite POSITIVE (*)    Leukocytes,Ua MODERATE (*)    Bacteria, UA  FEW (*)    All other components within normal limits  URINE CULTURE  CBC    EKG: EKG Interpretation Date/Time:  Sunday August 23 2024 19:41:19 EDT Ventricular Rate:  95 PR Interval:  168 QRS Duration:  86 QT Interval:  330 QTC Calculation: 414 R Axis:   -62  Text Interpretation: Normal sinus rhythm Left axis deviation When compared with ECG of 30-Nov-2021 11:07, No significant change was found Confirmed by Ruthe Cornet [phone]) on 08/23/2024 7:46:04 PM  Radiology: CT ANGIO HEAD NECK W WO CM Result Date: 08/23/2024 EXAM: CTA Head and Neck with Intravenous Contrast. CT Head without Contrast. CLINICAL HISTORY: Neuro deficit, acute, stroke suspected. vomiting and nausea since Friday. TECHNIQUE: Axial CTA images of the head and neck performed with and without intravenous contrast. MIP reconstructed images were created and reviewed. Axial computed tomography images of the head/brain performed without intravenous contrast. Note: Per PQRS, the description of internal carotid artery percent stenosis, including 0 percent or normal exam, is based on [place] American Symptomatic Carotid Endarterectomy Trial (NASCET) criteria. Dose reduction technique was used including one or more of the following: automated exposure control, adjustment of mA and kV according to patient size, and/or iterative reconstruction. CONTRAST: Without and with; 75 mL iohexol  (OMNIPAQUE ) 350 MG/ML injection. COMPARISON: Prior MRI from 07/12/2023. FINDINGS: CT HEAD: BRAIN: Age-related global atrophy with moderate chronic  microvascular ischemic disease. Mild calcified atherosclerosis present about the skull base. No acute intraparenchymal hemorrhage. No mass lesion. No CT evidence for acute territorial infarct. No midline shift or extra-axial collection. VENTRICLES: No hydrocephalus. ORBITS: The orbits are unremarkable. SINUSES AND MASTOIDS: Chronic left sphenoid sinusitis noted. The remaining paranasal sinuses and mastoid air cells are clear. CTA NECK: COMMON CAROTID ARTERIES: No significant stenosis. No dissection or occlusion. INTERNAL CAROTID ARTERIES: No stenosis by NASCET criteria. No dissection or occlusion. The distal cervical left ICA is somewhat ectatic. VERTEBRAL ARTERIES: No significant stenosis. No dissection or occlusion. CTA HEAD: ANTERIOR CEREBRAL ARTERIES: Right A1 segment hypoplastic. No significant stenosis. No occlusion. No aneurysm. MIDDLE CEREBRAL ARTERIES: No significant stenosis. No occlusion. No aneurysm. POSTERIOR CEREBRAL ARTERIES: No significant stenosis. No occlusion. No aneurysm. BASILAR ARTERY: No significant stenosis. No occlusion. No aneurysm. OTHER: Mild atheromatous change about the right carotid siphon without hemodynamically significant stenosis. 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 Left vertebral artery slightly dominant. Left PICA patent. Right PICA origin not seen. SOFT TISSUES: No acute finding. No masses or lymphadenopathy. BONES: No acute osseous abnormality. IMPRESSION: 1. Negative CTA for acute large vessel occlusion or other emergent finding. 2. 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 3. Mild for age-related atheromatous disease without hemodynamically significant or correctable stenosis. 4. No other acute intracranial abnormality. 5. Age-related atrophy with moderate chronic microvascular ischemic disease. Electronically signed by:  Morene Hoard MD 08/23/2024 11:18 PM EDT RP Workstation: [id number]   CT ABDOMEN PELVIS W CONTRAST Result Date: 08/23/2024 EXAM: CT ABDOMEN AND PELVIS WITH CONTRAST 08/23/2024 10:07:16 PM TECHNIQUE: CT of the abdomen and pelvis was performed with the administration of 75 mL of iohexol  (OMNIPAQUE ) 350 MG/ML injection. Multiplanar reformatted images are provided for review. Automated exposure control, iterative reconstruction, and/or weight-based adjustment of the mA/kV was utilized to reduce the radiation dose to as low as reasonably achievable. COMPARISON: Comparison with 11/30/2021. CLINICAL HISTORY: Abdominal pain, acute, nonlocalized. Vomiting and nausea since Friday. FINDINGS: LOWER CHEST:  No acute abnormality. LIVER: The liver is unremarkable. GALLBLADDER AND BILE DUCTS: Cholecystectomy. No biliary ductal dilatation. SPLEEN: No acute abnormality. PANCREAS: Fatty atrophy of the pancreas. No evidence of acute pancreatitis. ADRENAL GLANDS: No acute abnormality. KIDNEYS, URETERS AND BLADDER: No stones in the kidneys or ureters. No hydronephrosis. No perinephric or periureteral stranding. Urinary bladder is unremarkable. GI AND BOWEL: Stomach demonstrates no acute abnormality. There is no bowel obstruction. PERITONEUM AND RETROPERITONEUM: No ascites. No free air. VASCULATURE: Aortic atherosclerotic calcification. Infrarenal abdominal aortic aneurysm measuring 4.0 x 3.9 cm. LYMPH NODES: No lymphadenopathy. REPRODUCTIVE ORGANS: No acute abnormality. BONES AND SOFT TISSUES: No acute osseous abnormality. No focal soft tissue abnormality. IMPRESSION: 1. Infrarenal abdominal aortic aneurysm measuring 4.0 x 3.9 cm; recommend annual surveillance imaging per ACR guidelines. This has increased from 3.2 cm on 1 / 26 / 23. 2. No acute intra-abdominal or pelvic abnormality detected. Electronically signed by: Norman Gatlin MD 08/23/2024 10:13 PM EDT RP Workstation: [id number]     Procedures   Medications Ordered  in the ED  sodium chloride  0.9 % bolus 1,000 mL (0 mLs Intravenous Stopped 08/23/24 2320)  prochlorperazine (COMPAZINE) injection 5 mg (5 mg Intravenous Given 08/23/24 2143)  diphenhydrAMINE  (BENADRYL ) injection 12.5 mg (12.5 mg Intravenous Given 08/23/24 2143)  iohexol  (OMNIPAQUE ) 350 MG/ML injection 75 mL (75 mLs Intravenous Contrast Given 08/23/24 2206)  cephALEXin  (KEFLEX ) capsule 500 mg (500 mg Oral Given 08/23/24 2320)                                    Medical Decision Making Amount and/or Complexity of Data Reviewed Labs: ordered. Radiology: ordered.  Risk Prescription drug management.   FABIHA ROUGEAU is here with nausea and vomiting for the last couple days.  Nothing makes it worse or better.  Denies any diarrhea.  She has had a negative COVID and flu test at home.  She does not know of any suspicious food intake or sick contacts.  She has some abdominal cramping at times.  She has had a mild headache at times as well.  She is neurologically intact on exam.  Normal strength normal sensation.  She is not endorsing any major headache at this time.  Not endorsing any major abdominal pain at this time.  She feels dehydrated.  She has not really been able to hold anything down.  Zofran  at home has not helped much.  She has a history of fibromyalgia high cholesterol.  Differential diagnosis could be viral process versus less likely intracranial intra-abdominal process.  She does have a fever here.  I have no concern for meningitis.  I have very low concern for appendicitis head bleed or stroke but will get CT of the head and neck will get CT abdomen and pelvis check basic labs.  Will give IV fluids Compazine Benadryl  and reevaluate.  Not endorsing any chest pain or shortness of breath.  I doubt ACS PE or any other acute cardiac or pulmonary process.  EKG with sinus rhythm.  No ischemic changes.  Lab work overall shows no significant leukocytosis anemia or electrolyte abnormality.   Urinalysis appears consistent with infection.  Overall we will treat with antibiotics.  She is feeling completely better following Compazine and Benadryl  and fluids.  She is not having any nausea or vomiting.  Stable tolerate p.o.  Is not having any abdominal pain.  Headache has resolved but she has been having any major  headache today.  Ultimately she has known AAA that is slightly enlarged from prior she will follow-up with vascular surgery for this.  CTA of the head and neck was overall unremarkable for acute findings but maybe a small left ICA aneurysm we will have her follow-up with neurosurgery for this.  She was told to return if she develop any severe abdominal pain or neck pain/headache otherwise.  She is requesting Compazine prescription for home we will give that and Zofran .  Overall I do suspect that her symptoms are secondary to UTI/viral process.  Told to return if symptoms worsen.  Discharged in good condition.  This chart was dictated using voice recognition software.  Despite best efforts to proofread,  errors can occur which can change the documentation meaning.      Final diagnoses:  Acute cystitis without hematuria  Vomiting without nausea, unspecified vomiting type  Abdominal aortic aneurysm (AAA) without rupture, unspecified part  Internal carotid aneurysm    ED Discharge Orders          Ordered    cephALEXin  (KEFLEX ) 500 MG capsule  3 times daily        08/23/24 2316    prochlorperazine (COMPAZINE) 5 MG tablet  2 times daily PRN        08/23/24 2327               Ruthe Cornet, DO 08/23/24 2330

## 2024-08-27 LAB — URINE CULTURE: Culture: >=100000 — AB

## 2024-08-28 ENCOUNTER — Telehealth (HOSPITAL_BASED_OUTPATIENT_CLINIC_OR_DEPARTMENT_OTHER): Payer: Self-pay

## 2024-08-28 NOTE — Telephone Encounter (Signed)
 Post ED Visit - Positive Culture Follow-up: Successful Patient Follow-Up  Culture assessed and recommendations reviewed by:  []  Rankin Dee, Pharm.D. []  Venetia Gully, Pharm.D., BCPS AQ-ID []  Garrel Crews, Pharm.D., BCPS []  Almarie Lunger, [address].D., BCPS []  Marquez, [address].D., BCPS, AAHIVP []  Rosaline Bihari, Pharm.D., BCPS, AAHIVP [x]  Vernell Meier, PharmD, BCPS []  Latanya Hint, PharmD, BCPS []  Donald Medley, PharmD, BCPS []  Rocky Bold, PharmD  Positive urine culture  []  Patient discharged without antimicrobial prescription and treatment is now indicated []  Organism is resistant to prescribed ED discharge antimicrobial []  Patient with positive blood cultures  Changes discussed with ED provider: Signe Servant, PA-C  Plan: call pt to make sure she is feeling better. If no improvement will need to ask lab to run sensitivities.   Spoke with pt, pt states she is feeling a lot better, big improvement since the ED visit. No need for sensitivities.  Contacted patient, date 08/28/24, time 9:30 am   Paula Murphy 08/28/2024, 9:34 AM

## 2024-09-08 ENCOUNTER — Other Ambulatory Visit: Payer: Self-pay | Admitting: Vascular Surgery

## 2024-09-08 DIAGNOSIS — I714 Abdominal aortic aneurysm, without rupture, unspecified: Secondary | ICD-10-CM

## 2024-09-09 NOTE — Progress Notes (Signed)
 Office Visit Note  Patient: Paula Murphy             Date of Birth: 11-02-1946           MRN: [id number]             PCP: Nichole Senior, MD Referring: Nichole Senior, MD Visit Date: 09/23/2024 Occupation: Data Unavailable  Subjective:  Pain in both hips  History of Present Illness: Paula Murphy is a 78 y.o. female with undifferentiated connective tissue disease, osteoarthritis, degenerative disc disease, fibromyalgia and osteoporosis.  She returns today after her last visit in May 2025.  She states she has been having pain and discomfort in bilateral trochanteric bursa for about 3 months.  She states the pain is getting worse.  She is having difficulty walking.  She has difficulty sleeping on the side.  She requests cortisone injection to bilateral trochanteric bursa today.  She had good relief to trochanteric bursa injections in the past.  She continues to have some discomfort in her hands and feet.  She states her neck and trapezius pain is manageable.  Her bilateral total knee replacements are doing fine.  She continues to have generalized pain and discomfort from fibromyalgia.  She remains on tramadol  50 mg 1 to 2 tablets twice a day.  She describes her pain without tramadol  on the scale of 0-10 about 10 and with tramadol  it comes down to about 4-5 range.  She states she is unable to function without tramadol .  She has been on Prolia  injections for osteoporosis.    Activities of Daily Living:  Patient reports morning stiffness for less than 1 minute.   Patient Denies nocturnal pain.  Difficulty dressing/grooming: Denies Difficulty climbing stairs: Reports Difficulty getting out of chair: Denies Difficulty using hands for taps, buttons, cutlery, and/or writing: Reports  Review of Systems  Constitutional:  Positive for fatigue.  HENT:  Negative for mouth sores and mouth dryness.   Eyes:  Negative for dryness.  Respiratory:  Negative for shortness of breath.   Cardiovascular:   Negative for chest pain and palpitations.  Gastrointestinal:  Positive for diarrhea. Negative for blood in stool and constipation.  Endocrine: Negative for increased urination.  Genitourinary:  Negative for involuntary urination.  Musculoskeletal:  Positive for joint pain, gait problem, joint pain, myalgias, muscle weakness, morning stiffness, muscle tenderness and myalgias. Negative for joint swelling.  Skin:  Negative for color change, rash, hair loss and sensitivity to sunlight.  Allergic/Immunologic: Negative for susceptible to infections.  Neurological:  Negative for dizziness, numbness and headaches.  Hematological:  Negative for swollen glands.  Psychiatric/Behavioral:  Positive for depressed mood and sleep disturbance. The patient is nervous/anxious.     PMFS History:  Patient Active Problem List   Diagnosis Date Noted   OP (osteoporosis) 12/26/2023   Primary osteoarthritis of left knee 10/10/2020   Ingrown toenail 02/02/2019   Primary osteoarthritis of both feet 03/21/2017   History of macular degeneration 03/21/2017   Other fatigue 03/21/2017   Age-related osteoporosis without current pathological fracture 03/21/2017   Esophageal reflux 06/29/2013   Actinic keratosis 04/11/2013   OA (osteoarthritis) of knee 03/02/2013   DEPRESSION 07/24/2010   Major depressive disorder, single episode, unspecified 07/24/2010   UTI 07/04/2010   ABDOMINAL BLOATING 01/20/2010   Fibromyalgia 10/04/2009   INSOMNIA, CHRONIC 10/04/2009   Autoimmune disease 10/04/2009   Vitamin D  deficiency 10/26/2008   Hypothyroidism 08/13/2008   HYPERLIPIDEMIA 08/13/2008   Essential hypertension 08/13/2008   Headache 08/13/2008  Muscle pain 08/12/2008    Past Medical History:  Diagnosis Date   Arthritis    OA AND PAIN RT KNEE   Autoimmune disease    + ANA, and DS DNA--PT STATES SHE WAS TOLD SHE DOES NOT HAVE LUPUS AS PREVIOUSLY THOUGHT   Fibromyalgia    followed by Dr. Dolphus   Hemorrhoids     Hyperlipidemia    Hypothyroidism    Kidney stone    Macular degeneration    BOTH EYES   PONV (postoperative nausea and vomiting)    Thyroid  disease    Hypothyroidism    Family History  Problem Relation Age of Onset   Stroke Mother    Hypertension Mother    Hyperlipidemia Mother    Cancer Mother        Lung   Alzheimer's disease Mother    COPD Mother    Stroke Father    Hypertension Father    Diabetes Father        Type II   Hyperlipidemia Father    Cancer Father        Lung   Arthritis Father        Rheumatoid   COPD Father    COPD Brother    Other Brother        back surgery   Thyroid  disease Daughter    Breast cancer Cousin    Breast cancer Cousin    Past Surgical History:  Procedure Laterality Date   ABDOMINAL HYSTERECTOMY  11/06/1987   BUNIONECTOMY Left 11/05/2010   CHOLECYSTECTOMY  11/05/1994   EYE SURGERY     BILATERAL CATARACT EXTRACTION   KNEE ARTHROSCOPY  11/05/1992   Right Knee   KNEE SURGERY     NECK SURGERY  11/05/2001   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   on face   TONSILLECTOMY  11/06/1951   TOTAL KNEE ARTHROPLASTY Right 03/02/2013   Procedure: RIGHT TOTAL KNEE ARTHROPLASTY;  Surgeon: Dempsey LULLA Moan, MD;  Location: [facility];  Service: Orthopedics;  Laterality: Right;   TOTAL KNEE ARTHROPLASTY Left 10/10/2020   Procedure: TOTAL KNEE ARTHROPLASTY;  Surgeon: Moan Dempsey, MD;  Location: [facility];  Service: Orthopedics;  Laterality: Left;    Social History   Tobacco Use   Smoking status: Never    Passive exposure: Past   Smokeless tobacco: Never  Vaping Use   Vaping status: Never Used  Substance Use Topics   Alcohol use: Yes    Comment: rarely   Drug use: No   Social History   Social History Narrative   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  Constitutional:      Appearance: She is well-developed.  HENT:     Head: Normocephalic and atraumatic.  Eyes:     Conjunctiva/sclera: Conjunctivae normal.  Cardiovascular:     Rate and Rhythm: Normal  rate and regular rhythm.     Heart sounds: Normal heart sounds.  Pulmonary:     Effort: Pulmonary effort is normal.     Breath sounds: Normal breath sounds.  Abdominal:     General: Bowel sounds are normal.     Palpations: Abdomen is soft.  Musculoskeletal:     Cervical back: Normal range of motion.  Lymphadenopathy:     Cervical: No cervical adenopathy.  Skin:    General: Skin is warm and dry.     Capillary Refill: Capillary refill takes less than 2 seconds.  Neurological:     Mental Status: She is alert and oriented to person, place, and time.  Psychiatric:        Behavior: Behavior normal.      Musculoskeletal Exam: She had positive tender points and generalized hyperalgesia.  She had limited lateral rotation of the cervical spine.  Thoracic kyphosis was noted.  There was no tenderness over thoracic or lumbar spine.  Shoulders, elbows, wrist joints, MCPs PIPs and DIPs were in good range of motion.  No synovitis was noted.  Hip joints and knee joints in good range of motion without any warmth swelling or effusion.  She had tenderness over bilateral trochanteric bursa.  There was no tenderness over her ankles or MTPs.  CDAI Exam: CDAI Score: -- Patient Global: --; Provider Global: -- Swollen: --; Tender: -- Joint Exam 09/23/2024   No joint exam has been  documented for this visit   There is currently no information documented on the homunculus. Go to the Rheumatology activity and complete the homunculus joint exam.  Investigation: No additional findings.  Imaging: No results found.   Recent Labs: Lab Results  Component Value Date   WBC 6.4 08/23/2024   HGB 13.4 08/23/2024   PLT 220 08/23/2024   NA 139 08/23/2024   K 3.7 08/23/2024   CL 104 08/23/2024   CO2 20 (L) 08/23/2024   GLUCOSE 129 (H) 08/23/2024   BUN 10 08/23/2024   CREATININE 0.64 08/23/2024   BILITOT 0.4 08/23/2024   ALKPHOS 67 08/23/2024   AST 18 08/23/2024   ALT 8 08/23/2024   PROT 7.0 08/23/2024   ALBUMIN 4.1 08/23/2024   CALCIUM 9.2 08/23/2024   GFRAA 85 11/30/2020    Speciality Comments: PLQ Eye Exam: 08/22/2022 WNL @ [facility] Follow up in 1 year  Prolia : 02/07/18, 08/19/18, 10/12/19, 04/11/20, 12/06/20, 06/04/21,12/06/2021, 06/08/22, 12/05/22, 06/04/23,01/13/24,07/13/24  Procedures:  Large Joint Inj: bilateral greater trochanter on 09/23/2024 12:00 PM Indications: pain Details: 27 G 1.5 in needle, lateral approach  Arthrogram: No  Medications (Right): 1.5 mL lidocaine  1 %; 40 mg triamcinolone  acetonide 40 MG/ML Aspirate (Right): 0 mL Medications (Left): 1.5 mL lidocaine  1 %; 40 mg triamcinolone  acetonide 40 MG/ML Aspirate (Left): 0 mL Outcome: tolerated well, no immediate complications  Risk of infection, tendon injury, nerve injury, hypopigmentation and dermal atrophy were discussed. Procedure, treatment alternatives, risks and benefits explained, specific risks discussed. Consent was given by the patient. Immediately prior to procedure a time out was called to verify the correct patient, procedure, equipment, support staff and site/side marked as required. Patient was prepped and draped in the usual sterile fashion.     Allergies: Codeine, Hydrocodone, and Plaquenil  [hydroxychloroquine ]   Assessment / Plan:     Visit Diagnoses:  Undifferentiated connective tissue disease - positive ANA, positive double-stranded DNA, fatigue, arthralgias.  She continues to have fatigue and arthralgias.  She denies any history of oral ulcers, nasal ulcers,  sicca symptoms, malar rash, photosensitivity, Raynaud's or lymphadenopathy.  She has been off Plaquenil  since October 2024.  She has not had any flares of autoimmune disease.  Labs obtained in May 2025 showed positive double-stranded DNA, complements and sed rate were normal.  Will recheck labs today.   High risk medication  use - (Plaquenil  200 mg p.o. twice daily Monday to Friday started October 2023-till October 2024 and then discontinued PLQ Eye Exam: 08/22/2022.  Primary osteoarthritis of both hands-she has mild osteoarthritis in her hands and continues to have some stiffness.  No synovitis was noted.  Trochanteric bursitis of both hips-complains of ongoing pain and discomfort in bilateral trochanteric bursa and difficulty walking and nocturnal pain.  Patient requested bilateral trochanteric bursa injection.  After informed consent was obtained bilateral trochanteric bursae were injected with lidocaine  and Kenalog  as described above.  Patient tolerated the procedure well.  Patient states she is going to [employer] in February and would like to have repeat injection in her left trochanteric bursa if it is hurting.  She usually gets a good response to the trochanteric bursa injection lasting for about 5 to 6 months.  Status post total bilateral knee replacement - Right total knee replacement February 03, 2013, left total knee replaced in October 10, 2000 Dr. Hiram.  Doing well.  DDD (degenerative disc disease), cervical -she continues to have limited range of motion cervical spine.  Status post ACDF C5-C7.  Age-related osteoporosis without current pathological fracture - DEXA January 27, 2024:  Right femoral neck BMD 0.591, T-score -2.9.  There was -6% decrease in total femur BMD.  She has been on  Prolia  injections since 2019.  Her last Prolia  injection was in September 2025.  Medication monitoring encounter - Prolia : 07/13/2024.  Fibromyalgia -she is on tramadol  50 mg 1 to 2 tablets p.o. twice daily as needed prescribed by Dr. Nichole.  Patient states she is unable to function without tramadol .  Vitamin D  deficiency-vitamin D  was 42 on May 27, 2023.  Other fatigue-she continues to have fatigue related to insomnia and fibromyalgia.  Primary insomnia-good sleep hygiene was discussed.  Other medical problems are listed as follows:  Lymphedema  History of hyperlipidemia  History of macular degeneration  History of anxiety  History of hypertension-pressure was normal at 113/77.  Patient was advised to monitor blood pressure closely after the cortisone injection.  History of depression  History of hypothyroidism  Orders: Orders Placed This Encounter  Procedures   Large Joint Inj   Protein / creatinine ratio, urine   CBC with Differential/Platelet   Comprehensive metabolic panel with GFR   Anti-DNA antibody, double-stranded   C3 and C4   Sedimentation rate   No orders of the defined types were placed in this encounter.    Follow-Up Instructions: Return in about 3 months (around 12/24/2024) for Osteoarthritis, Osteoporosis.   Maya Nash, MD  Note - This record has been created using Animal nutritionist.  Chart creation errors have been sought, but may not always  have been located. Such creation errors do not reflect on  the standard of medical care.

## 2024-09-23 ENCOUNTER — Ambulatory Visit: Attending: Rheumatology | Admitting: Rheumatology

## 2024-09-23 ENCOUNTER — Encounter: Payer: Self-pay | Admitting: Rheumatology

## 2024-09-23 VITALS — BP 113/77 | HR 92 | Temp 97.1°F | Resp 15 | Ht 60.0 in | Wt 148.8 lb

## 2024-09-23 DIAGNOSIS — M19041 Primary osteoarthritis, right hand: Secondary | ICD-10-CM

## 2024-09-23 DIAGNOSIS — Z8669 Personal history of other diseases of the nervous system and sense organs: Secondary | ICD-10-CM

## 2024-09-23 DIAGNOSIS — M19042 Primary osteoarthritis, left hand: Secondary | ICD-10-CM

## 2024-09-23 DIAGNOSIS — M5030 Other cervical disc degeneration, unspecified cervical region: Secondary | ICD-10-CM

## 2024-09-23 DIAGNOSIS — M7062 Trochanteric bursitis, left hip: Secondary | ICD-10-CM

## 2024-09-23 DIAGNOSIS — E559 Vitamin D deficiency, unspecified: Secondary | ICD-10-CM

## 2024-09-23 DIAGNOSIS — R5383 Other fatigue: Secondary | ICD-10-CM

## 2024-09-23 DIAGNOSIS — Z96653 Presence of artificial knee joint, bilateral: Secondary | ICD-10-CM

## 2024-09-23 DIAGNOSIS — Z5181 Encounter for therapeutic drug level monitoring: Secondary | ICD-10-CM

## 2024-09-23 DIAGNOSIS — Z8639 Personal history of other endocrine, nutritional and metabolic disease: Secondary | ICD-10-CM

## 2024-09-23 DIAGNOSIS — Z79899 Other long term (current) drug therapy: Secondary | ICD-10-CM | POA: Diagnosis not present

## 2024-09-23 DIAGNOSIS — M359 Systemic involvement of connective tissue, unspecified: Secondary | ICD-10-CM | POA: Diagnosis not present

## 2024-09-23 DIAGNOSIS — G8929 Other chronic pain: Secondary | ICD-10-CM

## 2024-09-23 DIAGNOSIS — Z8659 Personal history of other mental and behavioral disorders: Secondary | ICD-10-CM

## 2024-09-23 DIAGNOSIS — M7061 Trochanteric bursitis, right hip: Secondary | ICD-10-CM

## 2024-09-23 DIAGNOSIS — Z8679 Personal history of other diseases of the circulatory system: Secondary | ICD-10-CM

## 2024-09-23 DIAGNOSIS — M797 Fibromyalgia: Secondary | ICD-10-CM

## 2024-09-23 DIAGNOSIS — M810 Age-related osteoporosis without current pathological fracture: Secondary | ICD-10-CM

## 2024-09-23 DIAGNOSIS — F5101 Primary insomnia: Secondary | ICD-10-CM

## 2024-09-23 DIAGNOSIS — I890 Lymphedema, not elsewhere classified: Secondary | ICD-10-CM

## 2024-09-23 MED ORDER — TRIAMCINOLONE ACETONIDE 40 MG/ML IJ SUSP
40.0000 mg | INTRAMUSCULAR | Status: AC | PRN
  Administered 2024-09-23: 40 mg via INTRA_ARTICULAR

## 2024-09-23 MED ORDER — LIDOCAINE HCL 1 % IJ SOLN
1.5000 mL | INTRAMUSCULAR | Status: AC | PRN
  Administered 2024-09-23: 1.5 mL

## 2024-09-23 NOTE — Patient Instructions (Addendum)
 Iliotibial Band Syndrome Rehab Ask your health care provider which exercises are safe for you. Do exercises exactly as told by your provider and adjust them as told. It's normal to feel mild stretching, pulling, tightness, or discomfort as you do these exercises. Stop right away if you feel sudden pain or your pain gets a lot worse. Do not begin these exercises until told by your provider. Stretching and range-of-motion exercises These exercises warm up your muscles and joints. They also improve the movement and flexibility of your hip and pelvis. Quadriceps stretch, prone  Lie face down (prone) on a firm surface like a bed or padded floor. Bend your left / right knee. Reach back to hold your ankle or pant leg. If you can't reach your ankle or pant leg, use a belt looped around your foot and grab the belt instead. Gently pull your heel toward your butt. Your knee should not slide out to the side. You should feel a stretch in the front of your thigh and knee, also called the quadriceps. Hold this position for __________ seconds. Repeat __________ times. Complete this exercise __________ times a day. Iliotibial band stretch The iliotibial band is a strip of tissue that runs along the outside of your hip down to your knee. Lie on your side with your left / right leg on top. Bend both knees and grab your left / right ankle. Stretch out your bottom arm to help you balance. Slowly bring your top knee back so your thigh goes behind your back. Slowly lower your top leg toward the floor until you feel a gentle stretch on the outside of your left / right hip and thigh. If you don't feel a stretch and your knee won't go farther, place the heel of your other foot on top of your knee and pull your knee down toward the floor with your foot. Hold this position for __________ seconds. Repeat __________ times. Complete this exercise __________ times a day. Strengthening exercises These exercises build strength  and endurance in your hip and pelvis. Endurance means your muscles can keep working even when they're tired. Straight leg raises, side-lying This exercise strengthens the muscles that rotate the leg at the hip and move it away from your body. These muscles are called hip abductors. Lie on your side with your left / right leg on top. Lie so your head, shoulder, hip, and knee line up. You can bend your bottom knee to help you balance. Roll your hips slightly forward so they're stacked directly over each other. Your left / right knee should face forward. Tense the muscles in your outer thigh and hip. Lift your top leg 4-6 inches (10-15 cm) off the ground. Hold this position for __________ seconds. Slowly lower your leg back down to the starting position. Let your muscles fully relax before doing this exercise again. Repeat __________ times. Complete this exercise __________ times a day. Leg raises, prone This exercise strengthens the muscles that move the hips backward. These muscles are called hip extensors. Lie face down (prone) on your bed or a firm surface. You can put a pillow under your hips for comfort and to support your lower back. Bend your left / right knee so your foot points straight up toward the ceiling. Keep the other leg straight and behind you. Squeeze your butt muscles. Lift your left / right thigh off the firm surface. Do not let your back arch. Tense your thigh muscle as hard as you can without having  more knee pain. Hold this position for __________ seconds. Slowly lower your leg to the starting position. Allow your leg to relax all the way. Repeat __________ times. Complete this exercise __________ times a day. Hip hike  Stand sideways on a bottom step. Place your feet so that your left / right leg is on the step, and the other foot is hanging off the side. If you need support for balance, hold onto a railing or wall. Keep your knees straight and your abdomen square,  meaning your hips are level. Then, lift your left / right hip up toward the ceiling. Slowly let your leg that's hanging off the step lower towards the floor. Your foot should get closer to the ground. Do not lean or bend your knees during this movement. Repeat __________ times. Complete this exercise __________ times a day. This information is not intended to replace advice given to you by your health care provider. Make sure you discuss any questions you have with your health care provider. Document Revised: 01/04/2023 Document Reviewed: 01/04/2023 Elsevier Patient Education  2024 [employer].

## 2024-09-24 ENCOUNTER — Ambulatory Visit: Payer: Self-pay | Admitting: Rheumatology

## 2024-09-24 LAB — CBC WITH DIFFERENTIAL/PLATELET
Absolute Lymphocytes: 2058 {cells}/uL (ref 850–3900)
Absolute Monocytes: 433 {cells}/uL (ref 200–950)
Basophils Absolute: 29 {cells}/uL (ref 0–200)
Basophils Relative: 0.5 %
Eosinophils Absolute: 120 {cells}/uL (ref 15–500)
Eosinophils Relative: 2.1 %
HCT: 43.1 % (ref 35.0–45.0)
Hemoglobin: 13.8 g/dL (ref 11.7–15.5)
MCH: 30.8 pg (ref 27.0–33.0)
MCHC: 32 g/dL (ref 32.0–36.0)
MCV: 96.2 fL (ref 80.0–100.0)
MPV: 10.7 fL (ref 7.5–12.5)
Monocytes Relative: 7.6 %
Neutro Abs: 3061 {cells}/uL (ref 1500–7800)
Neutrophils Relative %: 53.7 %
Platelets: 234 Thousand/uL (ref 140–400)
RBC: 4.48 Million/uL (ref 3.80–5.10)
RDW: 12.3 % (ref 11.0–15.0)
Total Lymphocyte: 36.1 %
WBC: 5.7 Thousand/uL (ref 3.8–10.8)

## 2024-09-24 LAB — PROTEIN / CREATININE RATIO, URINE
Creatinine, Urine: 197 mg/dL (ref 20–275)
Protein/Creat Ratio: 147 mg/g{creat} (ref 24–184)
Protein/Creatinine Ratio: 0.147 mg/mg{creat} (ref 0.024–0.184)
Total Protein, Urine: 29 mg/dL — ABNORMAL HIGH (ref 5–24)

## 2024-09-24 LAB — COMPREHENSIVE METABOLIC PANEL WITH GFR
AG Ratio: 1.5 (calc) (ref 1.0–2.5)
ALT: 8 U/L (ref 6–29)
AST: 12 U/L (ref 10–35)
Albumin: 4 g/dL (ref 3.6–5.1)
Alkaline phosphatase (APISO): 52 U/L (ref 37–153)
BUN: 21 mg/dL (ref 7–25)
CO2: 23 mmol/L (ref 20–32)
Calcium: 9.6 mg/dL (ref 8.6–10.4)
Chloride: 109 mmol/L (ref 98–110)
Creat: 0.8 mg/dL (ref 0.60–1.00)
Globulin: 2.6 g/dL (ref 1.9–3.7)
Glucose, Bld: 107 mg/dL — ABNORMAL HIGH (ref 65–99)
Potassium: 4 mmol/L (ref 3.5–5.3)
Sodium: 141 mmol/L (ref 135–146)
Total Bilirubin: 0.3 mg/dL (ref 0.2–1.2)
Total Protein: 6.6 g/dL (ref 6.1–8.1)
eGFR: 76 mL/min/1.73m2 (ref >=60)

## 2024-09-24 LAB — C3 AND C4
C3 Complement: 150 mg/dL (ref 83–193)
C4 Complement: 24 mg/dL (ref 15–57)

## 2024-09-24 LAB — SEDIMENTATION RATE: Sed Rate: 6 mm/h (ref 0–30)

## 2024-09-24 LAB — ANTI-DNA ANTIBODY, DOUBLE-STRANDED: ds DNA Ab: 56 [IU]/mL — ABNORMAL HIGH

## 2024-09-24 NOTE — Progress Notes (Signed)
 CBC, CMP and sed rate are normal.  Glucose is mildly elevated probably not a fasting sample.

## 2024-09-25 NOTE — Progress Notes (Signed)
 CBC normal.  CMP normal, glucose is mildly elevated probably not a fasting sample.  Urine protein creatinine ratio normal.  Double-stranded DNA remains positive for any stable titers.  Complements normal, sed rate normal.  Labs do not indicate an autoimmune disease flare.

## 2024-10-19 ENCOUNTER — Inpatient Hospital Stay (HOSPITAL_BASED_OUTPATIENT_CLINIC_OR_DEPARTMENT_OTHER)
Admission: EM | Admit: 2024-10-19 | Discharge: 2024-10-28 | Disposition: A | Discharge status: Home / Self Care | Attending: Internal Medicine | Admitting: Internal Medicine

## 2024-10-19 ENCOUNTER — Other Ambulatory Visit: Payer: Self-pay

## 2024-10-19 ENCOUNTER — Emergency Department (HOSPITAL_COMMUNITY)

## 2024-10-19 ENCOUNTER — Emergency Department (HOSPITAL_BASED_OUTPATIENT_CLINIC_OR_DEPARTMENT_OTHER)

## 2024-10-19 DIAGNOSIS — G459 Transient cerebral ischemic attack, unspecified: Secondary | ICD-10-CM | POA: Diagnosis present

## 2024-10-19 DIAGNOSIS — Z7982 Long term (current) use of aspirin: Secondary | ICD-10-CM

## 2024-10-19 DIAGNOSIS — Z741 Need for assistance with personal care: Secondary | ICD-10-CM | POA: Diagnosis present

## 2024-10-19 DIAGNOSIS — Z825 Family history of asthma and other chronic lower respiratory diseases: Secondary | ICD-10-CM

## 2024-10-19 DIAGNOSIS — H3530 Unspecified macular degeneration: Secondary | ICD-10-CM | POA: Diagnosis present

## 2024-10-19 DIAGNOSIS — Z7902 Long term (current) use of antithrombotics/antiplatelets: Secondary | ICD-10-CM

## 2024-10-19 DIAGNOSIS — R29810 Facial weakness: Secondary | ICD-10-CM

## 2024-10-19 DIAGNOSIS — R29701 NIHSS score 1: Secondary | ICD-10-CM | POA: Diagnosis present

## 2024-10-19 DIAGNOSIS — Z87442 Personal history of urinary calculi: Secondary | ICD-10-CM

## 2024-10-19 DIAGNOSIS — R1310 Dysphagia, unspecified: Secondary | ICD-10-CM | POA: Diagnosis present

## 2024-10-19 DIAGNOSIS — E782 Mixed hyperlipidemia: Secondary | ICD-10-CM | POA: Diagnosis present

## 2024-10-19 DIAGNOSIS — Z888 Allergy status to other drugs, medicaments and biological substances status: Secondary | ICD-10-CM

## 2024-10-19 DIAGNOSIS — R531 Weakness: Principal | ICD-10-CM

## 2024-10-19 DIAGNOSIS — Z803 Family history of malignant neoplasm of breast: Secondary | ICD-10-CM

## 2024-10-19 DIAGNOSIS — E559 Vitamin D deficiency, unspecified: Secondary | ICD-10-CM | POA: Diagnosis present

## 2024-10-19 DIAGNOSIS — Z8249 Family history of ischemic heart disease and other diseases of the circulatory system: Secondary | ICD-10-CM

## 2024-10-19 DIAGNOSIS — Z9049 Acquired absence of other specified parts of digestive tract: Secondary | ICD-10-CM

## 2024-10-19 DIAGNOSIS — G8191 Hemiplegia, unspecified affecting right dominant side: Secondary | ICD-10-CM | POA: Diagnosis present

## 2024-10-19 DIAGNOSIS — Z8639 Personal history of other endocrine, nutritional and metabolic disease: Secondary | ICD-10-CM

## 2024-10-19 DIAGNOSIS — Z820 Family history of epilepsy and other diseases of the nervous system: Secondary | ICD-10-CM

## 2024-10-19 DIAGNOSIS — Z79890 Hormone replacement therapy (postmenopausal): Secondary | ICD-10-CM

## 2024-10-19 DIAGNOSIS — K219 Gastro-esophageal reflux disease without esophagitis: Secondary | ICD-10-CM | POA: Diagnosis present

## 2024-10-19 DIAGNOSIS — R112 Nausea with vomiting, unspecified: Secondary | ICD-10-CM | POA: Diagnosis present

## 2024-10-19 DIAGNOSIS — Z96653 Presence of artificial knee joint, bilateral: Secondary | ICD-10-CM | POA: Diagnosis present

## 2024-10-19 DIAGNOSIS — I671 Cerebral aneurysm, nonruptured: Secondary | ICD-10-CM | POA: Diagnosis present

## 2024-10-19 DIAGNOSIS — I6381 Other cerebral infarction due to occlusion or stenosis of small artery: Principal | ICD-10-CM | POA: Diagnosis present

## 2024-10-19 DIAGNOSIS — Z885 Allergy status to narcotic agent status: Secondary | ICD-10-CM

## 2024-10-19 DIAGNOSIS — K5900 Constipation, unspecified: Secondary | ICD-10-CM | POA: Diagnosis not present

## 2024-10-19 DIAGNOSIS — Z823 Family history of stroke: Secondary | ICD-10-CM

## 2024-10-19 DIAGNOSIS — I639 Cerebral infarction, unspecified: Secondary | ICD-10-CM | POA: Diagnosis present

## 2024-10-19 DIAGNOSIS — Z90710 Acquired absence of both cervix and uterus: Secondary | ICD-10-CM

## 2024-10-19 DIAGNOSIS — F419 Anxiety disorder, unspecified: Secondary | ICD-10-CM | POA: Diagnosis present

## 2024-10-19 DIAGNOSIS — Z83438 Family history of other disorder of lipoprotein metabolism and other lipidemia: Secondary | ICD-10-CM

## 2024-10-19 DIAGNOSIS — F32A Depression, unspecified: Secondary | ICD-10-CM | POA: Diagnosis present

## 2024-10-19 DIAGNOSIS — M797 Fibromyalgia: Secondary | ICD-10-CM | POA: Diagnosis present

## 2024-10-19 DIAGNOSIS — Z833 Family history of diabetes mellitus: Secondary | ICD-10-CM

## 2024-10-19 DIAGNOSIS — I10 Essential (primary) hypertension: Secondary | ICD-10-CM | POA: Diagnosis present

## 2024-10-19 DIAGNOSIS — Z85828 Personal history of other malignant neoplasm of skin: Secondary | ICD-10-CM

## 2024-10-19 DIAGNOSIS — R270 Ataxia, unspecified: Secondary | ICD-10-CM | POA: Diagnosis present

## 2024-10-19 DIAGNOSIS — Z8261 Family history of arthritis: Secondary | ICD-10-CM

## 2024-10-19 DIAGNOSIS — Z79899 Other long term (current) drug therapy: Secondary | ICD-10-CM

## 2024-10-19 DIAGNOSIS — E039 Hypothyroidism, unspecified: Secondary | ICD-10-CM | POA: Diagnosis present

## 2024-10-19 LAB — CBC
HCT: 39.9 % (ref 36.0–46.0)
Hemoglobin: 13 g/dL (ref 12.0–15.0)
MCH: 31.3 pg (ref 26.0–34.0)
MCHC: 32.6 g/dL (ref 30.0–36.0)
MCV: 96.1 fL (ref 80.0–100.0)
Platelets: 213 K/uL (ref 150–400)
RBC: 4.15 MIL/uL (ref 3.87–5.11)
RDW: 14 % (ref 11.5–15.5)
WBC: 7.3 K/uL (ref 4.0–10.5)
nRBC: 0 % (ref 0.0–0.2)

## 2024-10-19 LAB — DIFFERENTIAL
Abs Immature Granulocytes: 0.01 K/uL (ref 0.00–0.07)
Basophils Absolute: 0 K/uL (ref 0.0–0.1)
Basophils Relative: 0 %
Eosinophils Absolute: 0.2 K/uL (ref 0.0–0.5)
Eosinophils Relative: 2 %
Immature Granulocytes: 0 %
Lymphocytes Relative: 27 %
Lymphs Abs: 2 K/uL (ref 0.7–4.0)
Monocytes Absolute: 0.4 K/uL (ref 0.1–1.0)
Monocytes Relative: 6 %
Neutro Abs: 4.7 K/uL (ref 1.7–7.7)
Neutrophils Relative %: 65 %

## 2024-10-19 LAB — ETHANOL: Alcohol, Ethyl (B): <15 mg/dL (ref <15)

## 2024-10-19 LAB — COMPREHENSIVE METABOLIC PANEL WITH GFR
ALT: 10 U/L (ref 0–44)
AST: 18 U/L (ref 15–41)
Albumin: 3.8 g/dL (ref 3.5–5.0)
Alkaline Phosphatase: 56 U/L (ref 38–126)
Anion gap: 9 (ref 5–15)
BUN: 15 mg/dL (ref 8–23)
CO2: 25 mmol/L (ref 22–32)
Calcium: 9.6 mg/dL (ref 8.9–10.3)
Chloride: 109 mmol/L (ref 98–111)
Creatinine, Ser: 0.79 mg/dL (ref 0.44–1.00)
GFR, Estimated: >60 mL/min (ref >60)
Glucose, Bld: 110 mg/dL — ABNORMAL HIGH (ref 70–99)
Potassium: 3.9 mmol/L (ref 3.5–5.1)
Sodium: 142 mmol/L (ref 135–145)
Total Bilirubin: 0.3 mg/dL (ref 0.0–1.2)
Total Protein: 6.7 g/dL (ref 6.5–8.1)

## 2024-10-19 LAB — PROTIME-INR
INR: 1 (ref 0.8–1.2)
Prothrombin Time: 14.2 s (ref 11.4–15.2)

## 2024-10-19 LAB — APTT: aPTT: 27 s (ref 24–36)

## 2024-10-19 LAB — CBG MONITORING, ED: Glucose-Capillary: 99 mg/dL (ref 70–99)

## 2024-10-19 MED ORDER — STROKE: EARLY STAGES OF RECOVERY BOOK
Freq: Once | Status: AC
  Filled 2024-10-19: qty 1

## 2024-10-19 MED ORDER — ACETAMINOPHEN 650 MG RE SUPP: 650.0000 mg | RECTAL | Status: DC | PRN

## 2024-10-19 MED ORDER — EZETIMIBE 10 MG PO TABS
10.0000 mg | ORAL_TABLET | Freq: Every day | ORAL | Status: DC
  Administered 2024-10-20 – 2024-10-28 (×9): 10 mg via ORAL
  Filled 2024-10-19 (×9): qty 1

## 2024-10-19 MED ORDER — ACETAMINOPHEN 160 MG/5ML PO SOLN: 650.0000 mg | ORAL | Status: DC | PRN

## 2024-10-19 MED ORDER — TOPIRAMATE 25 MG PO TABS
150.0000 mg | ORAL_TABLET | Freq: Every day | ORAL | Status: DC
  Administered 2024-10-20 – 2024-10-27 (×8): 150 mg via ORAL
  Filled 2024-10-19 (×9): qty 6

## 2024-10-19 MED ORDER — VITAMIN D 25 MCG (1000 UNIT) PO TABS
5000.0000 [IU] | ORAL_TABLET | Freq: Every day | ORAL | Status: DC
  Administered 2024-10-20 – 2024-10-28 (×9): 5000 [IU] via ORAL
  Filled 2024-10-19 (×9): qty 5

## 2024-10-19 MED ORDER — SODIUM CHLORIDE 0.9 % IV SOLN: INTRAVENOUS | Status: AC

## 2024-10-19 MED ORDER — SODIUM CHLORIDE 0.9% FLUSH: 3.0000 mL | Freq: Once | INTRAVENOUS | Status: DC

## 2024-10-19 MED ORDER — SENNOSIDES-DOCUSATE SODIUM 8.6-50 MG PO TABS
1.0000 | ORAL_TABLET | Freq: Every evening | ORAL | Status: DC | PRN
  Administered 2024-10-23 – 2024-10-27 (×3): 1 via ORAL
  Filled 2024-10-19 (×3): qty 1

## 2024-10-19 MED ORDER — LEVOTHYROXINE SODIUM 75 MCG PO TABS
150.0000 ug | ORAL_TABLET | Freq: Every day | ORAL | Status: DC
  Filled 2024-10-19: qty 2

## 2024-10-19 MED ORDER — ACETAMINOPHEN 325 MG PO TABS
650.0000 mg | ORAL_TABLET | ORAL | Status: DC | PRN
  Filled 2024-10-19: qty 2

## 2024-10-19 MED ORDER — TRAMADOL HCL 50 MG PO TABS
50.0000 mg | ORAL_TABLET | Freq: Three times a day (TID) | ORAL | Status: DC | PRN
  Administered 2024-10-20 (×2): 50 mg via ORAL
  Filled 2024-10-19 (×3): qty 1

## 2024-10-19 MED ORDER — ENOXAPARIN SODIUM 40 MG/0.4ML IJ SOSY
40.0000 mg | PREFILLED_SYRINGE | INTRAMUSCULAR | Status: DC
  Administered 2024-10-20 – 2024-10-28 (×9): 40 mg via SUBCUTANEOUS
  Filled 2024-10-19 (×9): qty 0.4

## 2024-10-19 MED ORDER — MIRABEGRON ER 25 MG PO TB24
25.0000 mg | ORAL_TABLET | Freq: Every day | ORAL | Status: DC
  Administered 2024-10-20 – 2024-10-28 (×9): 25 mg via ORAL
  Filled 2024-10-19 (×10): qty 1

## 2024-10-19 MED ORDER — ESCITALOPRAM OXALATE 10 MG PO TABS
10.0000 mg | ORAL_TABLET | Freq: Every day | ORAL | Status: DC
  Administered 2024-10-20 – 2024-10-28 (×9): 10 mg via ORAL
  Filled 2024-10-19 (×9): qty 1

## 2024-10-19 MED ORDER — IOHEXOL 350 MG/ML SOLN
75.0000 mL | Freq: Once | INTRAVENOUS | Status: AC | PRN
  Administered 2024-10-19: 15:00:00 75 mL via INTRAVENOUS

## 2024-10-19 MED ORDER — PANTOPRAZOLE SODIUM 40 MG PO TBEC
40.0000 mg | DELAYED_RELEASE_TABLET | Freq: Every day | ORAL | Status: DC
  Administered 2024-10-20 – 2024-10-28 (×9): 40 mg via ORAL
  Filled 2024-10-19 (×9): qty 1

## 2024-10-19 NOTE — ED Notes (Signed)
 IV secured, patient changed into normal clothes and knows to head to [facility], after paperwork is given.

## 2024-10-19 NOTE — ED Provider Notes (Cosign Needed Addendum)
 [place] EMERGENCY DEPARTMENT AT [facility] Provider Note   HPI/ROS    HPI/ROS:  Paula Murphy is a 78 y.o. female with a medical history as below who presents with concern for right leg weakness when she woke up.  She said that she went to bed feeling fine last night and even woke up around 4-5 AM this morning and felt fine as well.  When she got out of bed this morning around 9 AM she fell onto the ground because her right leg was feeling weak.  She also had right-sided arm weakness at that time too.  On arrival to the ED she initially had sided facial droop, right-sided arm weakness, and right-sided leg weakness.  She states that her symptoms of all resolved on my initial evaluation.    Past Medical History:  Diagnosis Date   Arthritis    OA AND PAIN RT KNEE   Autoimmune disease    + ANA, and DS DNA--PT STATES SHE WAS TOLD SHE DOES NOT HAVE LUPUS AS PREVIOUSLY THOUGHT   Fibromyalgia    followed by Dr. 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   on face   TONSILLECTOMY  11/06/1951   TOTAL KNEE ARTHROPLASTY Right 03/02/2013   Procedure: RIGHT TOTAL KNEE ARTHROPLASTY;  Surgeon: Dempsey LULLA Moan, MD;  Location: [facility];  Service: Orthopedics;  Laterality: Right;   TOTAL KNEE ARTHROPLASTY Left 10/10/2020   Procedure: TOTAL KNEE ARTHROPLASTY;  Surgeon: Moan Dempsey, MD;  Location: [facility];  Service: Orthopedics;  Laterality: Left;        MDM   See HPI section for pertinent HPI  and ROS. A complete ROS was performed with pertinent positives/negatives noted above.   Physical exam is notable for: - Largely resolved right-sided deficits with no significant facial droop or weakness.  Patient's symptoms have largely resolved since arrival.  Spoke with neurology who recommended  admission for CVA/TIA.CBC with no significant leukocytosis or anemia.  CMP with no significant lecture light abnormality, AKI, or transaminitis.  PT and PTT within normal notes.  Ethanol negative.  CTA head and neck here was unremarkable aside from known ICA aneurysm.  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  Was not an LVO so did not require thrombectomy and was outside of the window for TNK.  Spoke with hospitalist team and they are planning for admission with neurology consultation.  Patient admitted to the hospitalist service for further workup and management.   Interpretations, interventions, and the patient's course of care are documented below.    Clinical Course as of 10/19/24 1902  Mon Oct 19, 2024  1900 MR BRAIN WO CONTRAST [SE]    Clinical Course User Index [SE] Guillermina Hamilton, MD     Disposition:  I discussed the case with Dr.Prosper who graciously agreed to admit the patient to their service for continued  care.   Clinical Impression:  1. Right sided weakness   2. Facial droop    The plan for this patient was discussed with Dr. Cleotilde, who voiced agreement and who oversaw evaluation and treatment of this patient.   Clinical Complexity A medically appropriate history, review of systems, and physical exam was performed.  My independent interpretations of EKG, labs, and radiology are documented in the ED course above.   If decision rules were used in this patient's evaluation, they are listed below.   Click here for ABCD2, HEART and other calculatorsREFRESH Note before signing   Patient's presentation is most consistent with acute presentation with  potential threat to life or bodily function.   Physical Exam   Vitals:   10/19/24 1252 10/19/24 1330 10/19/24 1500 10/19/24 1559  BP: (!) 149/57 (!) 157/66 (!) 178/82 (!) 151/81  Pulse: 66 67 69 85  Resp: 13 15 (!) 23 18  Temp:    98.3 F (36.8 C)  TempSrc:      SpO2: 98% 100% 99% 98%  Weight:      Height:        Physical Exam Constitutional:      Appearance: Normal appearance.  HENT:     Head: Normocephalic and atraumatic.     Mouth/Throat:     Mouth: Mucous membranes are moist.     Pharynx: Oropharynx is clear.  Eyes:     Extraocular Movements: Extraocular movements intact.     Pupils: Pupils are equal, round, and reactive to light.  Cardiovascular:     Rate and Rhythm: Normal rate and regular rhythm.  Pulmonary:     Effort: Pulmonary effort is normal.     Breath sounds: Normal breath sounds. No wheezing or rales.  Abdominal:     General: Abdomen is flat.     Palpations: Abdomen is soft.     Tenderness: There is no abdominal tenderness. There is no guarding.  Skin:    General: Skin is warm and dry.     Capillary Refill: Capillary refill takes less than 2 seconds.  Neurological:     General: No focal deficit present.     Mental Status: She is alert and oriented to person, place, and time.     GCS: GCS eye subscore is 4. GCS verbal subscore is 5. GCS motor subscore is 6.     Cranial Nerves: Cranial nerves 2-12 are intact.     Comments: 5-5 out of 5 strength in all extremities.  Sensation intact in all extremities.  No obvious dysarthria.    Procedures   If procedures were preformed on this patient, they are listed below:  Procedures   Glendia JAYSON Ancona MD, PGY-2  Please note that this documentation was produced with the assistance of voice-to-text technology and may contain errors.     Ancona Glendia, MD 10/19/24 [id number]    Ancona Glendia, MD 10/19/24 RONNALD    Cleotilde Rogue, MD 10/20/24 [phone]

## 2024-10-19 NOTE — ED Notes (Signed)
 Pt AO x4, NIH of 1, Right leg weakness while ambulating... Pt had nothing else to note per NIH... Family and Pt agreed... Pt understood where, how, why and when to get to [facility]... Family was going to drive the PT directly over without stopping at home or to get food.... IV left in place per Provider.SABRASABRA

## 2024-10-19 NOTE — Consult Note (Signed)
 NEUROLOGY CONSULT NOTE   Date of service: October 19, 2024 Patient Name: Paula Murphy MRN:  [id number] DOB:  1946/07/14 Chief Complaint: Episode of R facial droop and R sided weakness Requesting Provider: Lou Claretta HERO, MD  History of Present Illness  IMBERLY TROXLER is a 78 y.o. female with hx of  HLD, macular degeneration, nephrolithiasis, hypothyroidism who went to bed at 2130 on 10/18/24 and woke up between 0330 and 0400 and was wife. She later got up at 0930 with R sided weakness and slid out of bed. She was unabel to get up as her R leg kept giving out. Called her daughter who helped her get up.  She was brought in to the ED. MRI brain demonstrates a small left BG stroke.  No prior hx of stroke. Reports that her parents had stroke. She doe snot smoke, no recreational substances. Reports prior hx of HTN but no longer hypertensive. Endorses myalgias from statins and is on zetia .  LKW: 0300 Modified rankin score: 0-Completely asymptomatic and back to baseline post- stroke IV Thrombolysis: not offered, no symptoms EVT: not offered, no LVO and no symptoms.   NIHSS components Score: Comment  1a Level of Conscious 0[]  1[]  2[]  3[]      1b LOC Questions 0[]  1[]  2[]       1c LOC Commands 0[]  1[]  2[]       2 Best Gaze 0[]  1[]  2[]       3 Visual 0[]  1[]  2[]  3[]      4 Facial Palsy 0[]  1[]  2[]  3[]      5a Motor Arm - left 0[]  1[]  2[]  3[]  4[]  UN[]    5b Motor Arm - Right 0[]  1[]  2[]  3[]  4[]  UN[]    6a Motor Leg - Left 0[]  1[]  2[]  3[]  4[]  UN[]    6b Motor Leg - Right 0[]  1[]  2[]  3[]  4[]  UN[]    7 Limb Ataxia 0[]  1[]  2[]  UN[]      8 Sensory 0[]  1[]  2[]  UN[]      9 Best Language 0[]  1[]  2[]  3[]      10 Dysarthria 0[]  1[]  2[]  UN[]      11 Extinct. and Inattention 0[]  1[]  2[]       TOTAL: 0      ROS  Comprehensive ROS performed and pertinent positives documented in HPI   Past History   Past Medical History:  Diagnosis Date   Arthritis    OA AND PAIN RT KNEE   Autoimmune disease    +  ANA, and DS DNA--PT STATES SHE WAS TOLD SHE DOES NOT HAVE LUPUS AS PREVIOUSLY THOUGHT   Fibromyalgia    followed by Dr. 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   on face   TONSILLECTOMY  11/06/1951   TOTAL KNEE ARTHROPLASTY Right 03/02/2013   Procedure: RIGHT TOTAL KNEE ARTHROPLASTY;  Surgeon: Dempsey LULLA Moan, MD;  Location: [facility];  Service: Orthopedics;  Laterality: Right;  TOTAL KNEE ARTHROPLASTY Left 10/10/2020   Procedure: TOTAL KNEE ARTHROPLASTY;  Surgeon: Melodi Lerner, MD;  Location: [facility];  Service: Orthopedics;  Laterality: Left;     Family History: Family History  Problem Relation Age of Onset   Stroke Mother    Hypertension Mother    Hyperlipidemia Mother    Cancer Mother        Lung   Alzheimer's disease Mother    COPD Mother    Stroke Father    Hypertension Father    Diabetes Father        Type II   Hyperlipidemia Father    Cancer Father        Lung   Arthritis Father        Rheumatoid   COPD Father    COPD Brother    Other Brother        back surgery   Thyroid  disease Daughter    Breast cancer Cousin    Breast cancer Cousin     Social History  reports that she has never smoked. She has been exposed to tobacco smoke. She has never used smokeless tobacco. She reports current alcohol use. She reports that she does not use drugs.  Allergies[1]  Medications  Current Medications[2]  Vitals   Vitals:   10/19/24 1330 10/19/24 1500 10/19/24 1559 10/19/24  1945  BP: (!) 157/66 (!) 178/82 (!) 151/81 (!) 156/74  Pulse: 67 69 85 79  Resp: 15 (!) 23 18 18   Temp:   98.3 F (36.8 C) 98.7 F (37.1 C)  TempSrc:    Oral  SpO2: 100% 99% 98% 95%  Weight:      Height:        Body mass index is 28.51 kg/m.   Physical Exam   General: Laying comfortably in bed; in no acute distress.  HENT: Normal oropharynx and mucosa. Normal external appearance of ears and nose.  Neck: Supple, no pain or tenderness  CV: No JVD. No peripheral edema.  Pulmonary: Symmetric Chest rise. Normal respiratory effort.  Abdomen: Soft to touch, non-tender.  Ext: No cyanosis, or deformity. BL lower ext edema. Skin: No rash. Normal palpation of skin.   Musculoskeletal: Normal digits and nails by inspection. No clubbing.   Neurologic Examination  Mental status/Cognition: Alert, oriented to self, place, month and year, good attention.  Speech/language: Fluent, comprehension intact, object naming intact, repetition intact.  Cranial nerves:   CN II Pupils equal and reactive to light, no VF deficits    CN III,IV,VI EOM intact, no gaze preference or deviation, no nystagmus    CN V normal sensation in V1, V2, and V3 segments bilaterally    CN VII no asymmetry, no nasolabial fold flattening    CN VIII normal hearing to speech    CN IX & X normal palatal elevation, no uvular deviation    CN XI 5/5 head turn and 5/5 shoulder shrug bilaterally    CN XII midline tongue protrusion    Motor:  Muscle bulk: normal, tone normal, pronator drift none tremor none Mvmt Root Nerve  Muscle Right Left Comments  SA C5/6 Ax Deltoid 5 5   EF C5/6 Mc Biceps 5 5   EE C6/7/8 Rad Triceps 5 5   WF C6/7 Med FCR     WE C7/8 PIN ECU     F Ab C8/T1 U ADM/FDI 5 5   HF L1/2/3 Fem Illopsoas 5 5   KE L2/3/4 Fem Quad 5 5   DF  L4/5 D Peron Tib Ant 5 5   PF S1/2 Tibial Grc/Sol 5 5    Sensation:  Light touch Intact throughout   Pin prick    Temperature    Vibration   Proprioception     Coordination/Complex Motor:  - Finger to Nose intact BL - Heel to shin intact BL - Rapid alternating movement are slowed BL. - Gait: deferred.  Labs/Imaging/Neurodiagnostic studies   CBC:  Recent Labs  Lab Oct 22, 2024 1304  WBC 7.3  NEUTROABS 4.7  HGB 13.0  HCT 39.9  MCV 96.1  PLT 213   Basic Metabolic Panel:  Lab Results  Component Value Date   NA 142 2024-10-22   K 3.9 10/22/2024   CO2 25 10-22-24   GLUCOSE 110 (H) 10-22-2024   BUN 15 2024-10-22   CREATININE 0.79 10/22/24   CALCIUM 9.6 Oct 22, 2024   GFRNONAA >60 Oct 22, 2024   GFRAA 85 11/30/2020   Lipid Panel:  Lab Results  Component Value Date   LDLCALC 124 (H) 10/06/2010   HgbA1c:  Lab Results  Component Value Date   HGBA1C 5.7 07/31/2022   Urine Drug Screen: No results found for: LABOPIA, COCAINSCRNUR, LABBENZ, AMPHETMU, THCU, LABBARB  Alcohol Level     Component Value Date/Time   [facility] <15 2024/10/22 1304   INR  Lab Results  Component Value Date   INR 1.0 October 22, 2024   APTT  Lab Results  Component Value Date   APTT 27 10/22/2024   AED levels: No results found for: PHENYTOIN, ZONISAMIDE, LAMOTRIGINE, LEVETIRACETA  CT Head without contrast(Personally reviewed): CTH was negative for a large hypodensity concerning for a large territory infarct or hyperdensity concerning for an ICH  CT angio Head and Neck with contrast(Personally reviewed): No LVO  MRI Brain(Personally reviewed): Small L BG stroke  ASSESSMENT   DARIANE NATZKE is a 78 y.o. female ith hx of  HLD, macular degeneration, nephrolithiasis, hypothyroidism who p/w episode of R sided weakness and facial droop that lasted several hours and resolved. Now back to baseline. MRI Brain shows a small L BG stroke.  Etiology of stroke is likely small vessel disease.  RECOMMENDATIONS  - Frequent Neuro checks per stroke unit protocol - Recommend brain imaging with MRI Brain without contrast - Recommend obtaining TTE  -  Recommend obtaining Lipid panel with LDL - Please start statin if LDL > 70 - Recommend HbA1c to evaluate for diabetes and how well it is controlled. - Antithrombotic - aspirin  81mg  dialy along with plavix  75mg  daily x 21days, followed by Aspirin  81mg  daily alone. - Recommend DVT ppx - SBP goal - permissive hypertension first 24 h < 220/110. Held home meds.  - Recommend Telemetry monitoring for arrythmia - Recommend bedside swallow screen prior to PO intake. - Stroke education booklet - Recommend PT/OT/SLP consult - Recommend Urine Tox screen.  ______________________________________________________________________  I personally spent a total of 60 minutes in the care of the patient today including preparing to see the patient, getting/reviewing separately obtained history, performing a medically appropriate exam/evaluation, referring and communicating with other health care professionals, documenting clinical information in the EHR, independently interpreting results, communicating results, and coordinating care.   Plan discussed with Dr. Claretta with the Hospitalist team.  Signed, Nunzio Banet, MD [facility]     [1]  Allergies Allergen Reactions   Codeine Nausea And Vomiting    NAUSEA & VOMITING   Hydrocodone Nausea And Vomiting   Plaquenil  [Hydroxychloroquine ]     Vision changes per patient   [2]  Current Facility-Administered Medications:    [START ON 10/20/2024]  stroke: early stages of recovery book, , Does not apply, Once, [place], Claretta HERO, MD   0.9 %  sodium chloride  infusion, , Intravenous, Continuous, [place], Claretta HERO, MD   acetaminophen  (TYLENOL ) tablet 650 mg, 650 mg, Oral, Q4H PRN **OR** acetaminophen  (TYLENOL ) 160 MG/5ML solution 650 mg, 650 mg, Per Tube, Q4H PRN **OR** acetaminophen  (TYLENOL ) suppository 650 mg, 650 mg, Rectal, Q4H PRN, Lou Claretta HERO, MD   [START ON 01/09/2025] denosumab  (PROLIA ) injection 60 mg, 60 mg, Subcutaneous, Q6  months,    [START ON 10/20/2024] enoxaparin  (LOVENOX ) injection 40 mg, 40 mg, Subcutaneous, Q24H, [place], Claretta HERO, MD   senna-docusate (Senokot-S) tablet 1 tablet, 1 tablet, Oral, QHS PRN, Lou, Claretta HERO, MD   sodium chloride  flush (NS) 0.9 % injection 3 mL, 3 mL, Intravenous, Once, Prosperi, Christian H, PA-C  Current Outpatient Medications:    amoxicillin (AMOXIL) 500 MG capsule, Take 2,000 mg by mouth once. (Patient not taking: Reported on 09/23/2024), Disp: , Rfl:    Bisacodyl  (LAXATIVE PO), Take 1 tablet by mouth daily. Vegetable (Patient taking differently: Take 1 tablet by mouth as needed. Vegetable), Disp: , Rfl:    Cholecalciferol  (VITAMIN D3) 5000 units CAPS, Take 5,000 Units by mouth daily. , Disp: , Rfl:    denosumab  (PROLIA ) 60 MG/ML SOSY injection, Inject 60 mg into the skin every 6 (six) months. CALL PT FOR PAYMENT INFORMATION. 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 Appt on 07/13/2024, Disp: 1 mL, Rfl: 0   escitalopram  (LEXAPRO ) 10 MG tablet, Take 20 mg by mouth daily., Disp: , Rfl:    ezetimibe  (ZETIA ) 10 MG tablet, Take 10 mg by mouth daily. , Disp: , Rfl:    fluticasone (FLONASE) 50 MCG/ACT nasal spray, Place 1 spray into both nostrils daily as needed for allergies. , Disp: , Rfl:    GEMTESA 75 MG TABS, Take 1 tablet by mouth daily., Disp: , Rfl:    ibuprofen  (ADVIL ) 800 MG tablet, Take 1 tablet by mouth daily as needed., Disp: , Rfl:    levothyroxine  (SYNTHROID ) 150 MCG tablet, Take 150 mcg by mouth daily before breakfast., Disp: , Rfl:    loperamide  (IMODIUM ) 2 MG capsule, Take 1 capsule (2 mg total) by mouth 4 (four) times daily as needed for diarrhea or loose stools., Disp: 12 capsule, Rfl: 0   methocarbamol  (ROBAXIN ) 500 MG tablet, Take 500 mg by mouth at bedtime., Disp: , Rfl:    Multiple Vitamins-Minerals (PRESERVISION AREDS) CAPS, Take 1 capsule by mouth daily., Disp: , Rfl:    NYSTATIN EX, Apply topically as needed., Disp: , Rfl:     ondansetron  (ZOFRAN -ODT) 4 MG disintegrating tablet, Take 1 tablet (4 mg total) by mouth every 8 (eight) hours as needed., Disp: 20 tablet, Rfl: 0   pantoprazole  (PROTONIX ) 40 MG tablet, Take 40 mg by mouth daily., Disp: , Rfl:    prochlorperazine  (COMPAZINE ) 5 MG tablet, Take 1 tablet (5 mg total) by mouth 2 (two) times daily as needed for up to 10 doses for nausea or vomiting. (Patient not taking: Reported on 09/23/2024), Disp: 10 tablet, Rfl: 0   simethicone (MYLICON) 125 MG chewable tablet, 125 mg daily as needed for flatulence. , Disp: , Rfl:    topiramate  (TOPAMAX ) 50 MG tablet, TAKE 3 TABLETS BY MOUTH DAILY, Disp: 270 tablet, Rfl: 0   traMADol  (ULTRAM ) 50 MG tablet, Take 50 mg by mouth 3 (three) times daily as needed.,  Disp: , Rfl:    TRIAMCINOLONE  ACETONIDE EX, Apply topically as needed., Disp: , Rfl:    valACYclovir  (VALTREX ) 500 MG tablet, Take 500 mg by mouth daily as needed (fever blister). , Disp: , Rfl:

## 2024-10-19 NOTE — H&P (Signed)
 History and Physical  Paula Murphy FMW:[id number] DOB: 26-Dec-1945 DOA: 10/19/2024  PCP: Nichole Senior, MD   Chief Complaint: Right-sided weakness  HPI: Paula Murphy is a 78 y.o. female with medical history significant for osteoarthritis, fibromyalgia, HLD, hypothyroidism, DDD, macular degeneration, nephrolithiasis and depression who presented to the ED for evaluation of right-sided weakness. Patient reports she was at her usual state of health last night before going to bed. When she woke up this morning, she noticed weakness in her right leg so she slid out of bed. She was unable to get off the floor due to weakness in her right arm as well. She called her daughter who assisted her to get back. She initially presented to [facility] for evaluation, she received a CT scan and was sent to the [facility] ED for MRI brain. Reports her right-sided weakness has significantly improved but not at 100 percent. She denies any numbness, tingling, vision changes, headache, chest pain, shortness of breath or falls.  ED Course: Initial vitals show patient afebrile but hypertensive with SBP in the 140s to 170s. Initial labs overall unremarkable with normal CBC CMP and INR. EKG shows sinus rhythm.  CTA head and neck shows no acute acute abnormality and no LVO.  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 Neurology and vascular surgery were consulted for evaluation. TRH was consulted for admission.   Review of Systems: Please see HPI for pertinent positives and negatives. A complete 10 system review of systems are otherwise negative.  Past Medical History:  Diagnosis Date   Arthritis    OA AND PAIN RT KNEE   Autoimmune disease    + ANA, and DS DNA--PT STATES SHE WAS TOLD SHE DOES NOT HAVE LUPUS AS PREVIOUSLY THOUGHT   Fibromyalgia    followed by Dr. Dolphus   Hemorrhoids    Hyperlipidemia    Hypothyroidism    Kidney stone    Macular  degeneration    BOTH EYES   PONV (postoperative nausea and vomiting)    Thyroid  disease    Hypothyroidism   Past Surgical History:  Procedure Laterality Date   ABDOMINAL HYSTERECTOMY  11/06/1987   BUNIONECTOMY Left 11/05/2010   CHOLECYSTECTOMY  11/05/1994   EYE SURGERY     BILATERAL CATARACT EXTRACTION   KNEE ARTHROSCOPY  11/05/1992   Right Knee   KNEE SURGERY     NECK SURGERY  11/05/2001   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   on face   TONSILLECTOMY  11/06/1951   TOTAL KNEE ARTHROPLASTY Right 03/02/2013   Procedure: RIGHT TOTAL KNEE ARTHROPLASTY;  Surgeon: Dempsey LULLA Moan, MD;  Location: [facility];  Service: Orthopedics;  Laterality: Right;   TOTAL KNEE ARTHROPLASTY Left 10/10/2020   Procedure: TOTAL KNEE ARTHROPLASTY;  Surgeon: Moan Dempsey, MD;  Location: [facility];  Service: Orthopedics;  Laterality: Left;    Social History:  reports that she has never smoked. She has been exposed to tobacco smoke. She has never used smokeless tobacco. She reports current alcohol use. She reports that she does not use drugs.  Allergies[1]  Family History  Problem Relation Age of Onset   Stroke Mother    Hypertension Mother    Hyperlipidemia Mother    Cancer Mother        Lung   Alzheimer's disease Mother    COPD Mother    Stroke Father    Hypertension Father    Diabetes Father  Type II   Hyperlipidemia Father    Cancer Father        Lung   Arthritis Father        Rheumatoid   COPD Father    COPD Brother    Other Brother        back surgery   Thyroid  disease Daughter    Breast cancer Cousin    Breast cancer Cousin      Prior to Admission medications  Medication Sig Start Date End Date Taking? Authorizing Provider  amoxicillin (AMOXIL) 500 MG capsule Take 2,000 mg by mouth once. Patient not taking: Reported on 09/23/2024    [provider]  Bisacodyl  (LAXATIVE PO) Take 1 tablet by mouth daily. Vegetable Patient taking differently: Take 1 tablet by  mouth as needed. Vegetable    [provider]  Cholecalciferol  (VITAMIN D3) 5000 units CAPS Take 5,000 Units by mouth daily.     [provider]  denosumab  (PROLIA ) 60 MG/ML SOSY injection Inject 60 mg into the skin every 6 (six) months. CALL PT FOR PAYMENT INFORMATION. Courier to rheum: [address], [address], [place] [address]. Appt on 07/13/2024 06/30/24   Dolphus Reiter, MD  escitalopram  (LEXAPRO ) 10 MG tablet Take 20 mg by mouth daily.    [provider]  ezetimibe  (ZETIA ) 10 MG tablet Take 10 mg by mouth daily.  12/18/16 09/23/24  [provider]  fluticasone (FLONASE) 50 MCG/ACT nasal spray Place 1 spray into both nostrils daily as needed for allergies.  08/21/16   [provider]  GEMTESA 75 MG TABS Take 1 tablet by mouth daily.    [provider]  ibuprofen  (ADVIL ) 800 MG tablet Take 1 tablet by mouth daily as needed. 02/13/24   [provider]  levothyroxine  (SYNTHROID ) 150 MCG tablet Take 150 mcg by mouth daily before breakfast. 03/16/22   [provider]  loperamide  (IMODIUM ) 2 MG capsule Take 1 capsule (2 mg total) by mouth 4 (four) times daily as needed for diarrhea or loose stools. 11/30/21   Long, Fonda MATSU, MD  methocarbamol  (ROBAXIN ) 500 MG tablet Take 500 mg by mouth at bedtime.    [provider]  Multiple Vitamins-Minerals (PRESERVISION AREDS) CAPS Take 1 capsule by mouth daily.    [provider]  NYSTATIN EX Apply topically as needed.    [provider]  ondansetron  (ZOFRAN -ODT) 4 MG disintegrating tablet Take 1 tablet (4 mg total) by mouth every 8 (eight) hours as needed. 11/30/21   Long, Joshua G, MD  pantoprazole  (PROTONIX ) 40 MG tablet Take 40 mg by mouth daily. 09/07/20   [provider]  prochlorperazine  (COMPAZINE ) 5 MG tablet Take 1 tablet (5 mg total) by mouth 2 (two) times daily as needed for up to 10 doses for nausea or vomiting. Patient not taking: Reported on  09/23/2024 08/23/24   Ruthe Cornet, DO  simethicone (MYLICON) 125 MG chewable tablet 125 mg daily as needed for flatulence.  05/27/14   [provider]  topiramate  (TOPAMAX ) 50 MG tablet TAKE 3 TABLETS BY MOUTH DAILY 04/15/24   Dolphus Reiter, MD  traMADol  (ULTRAM ) 50 MG tablet Take 50 mg by mouth 3 (three) times daily as needed.    [provider]  TRIAMCINOLONE  ACETONIDE EX Apply topically as needed.    [provider]  valACYclovir  (VALTREX ) 500 MG tablet Take 500 mg by mouth daily as needed (fever blister).  05/26/14   [provider]    Physical Exam: BP ROLLEN)  156/74   Pulse 79   Temp 98.7 F (37.1 C) (Oral)   Resp 18   Ht 5' (1.524 m)   Wt 66.2 kg   SpO2 95%   BMI 28.51 kg/m  General: Pleasant, well-appearing elderly woman laying in bed. No acute distress. HEENT: [place]/AT. Anicteric sclera.  EOMI. PERRLA. CV: RRR. No murmurs, rubs, or gallops. Pulmonary: Lungs CTAB. Normal effort. No wheezing or rales. Abdominal: Soft, nontender, nondistended. Normal bowel sounds. Extremities: Trace BLE edema. Palpable pedal pulses. Skin: Warm and dry. No obvious rash or lesions. Neuro: A&Ox3. Speech is clear and comprehensible. No facial asymmetry. Normal finger-nose testing. Strength 5/5 in all extremities. Normal sensation to light touch. No focal deficit. Psych: Normal mood and affect          Labs on Admission:  Basic Metabolic Panel: Recent Labs  Lab 10/19/24 1304  NA 142  K 3.9  CL 109  CO2 25  GLUCOSE 110*  BUN 15  CREATININE 0.79  CALCIUM 9.6   Liver Function Tests: Recent Labs  Lab 10/19/24 1304  AST 18  ALT 10  ALKPHOS 56  BILITOT 0.3  PROT 6.7  ALBUMIN 3.8   No results for input(s): LIPASE, AMYLASE in the last 168 hours. No results for input(s): AMMONIA in the last 168 hours. CBC: Recent Labs  Lab 10/19/24 1304  WBC 7.3  NEUTROABS 4.7  HGB 13.0  HCT 39.9  MCV 96.1  PLT 213   Cardiac Enzymes: No results for  input(s): CKTOTAL, CKMB, CKMBINDEX, TROPONINI in the last 168 hours. BNP (last 3 results) No results for input(s): BNP in the last 8760 hours.  ProBNP (last 3 results) No results for input(s): PROBNP in the last 8760 hours.  CBG: Recent Labs  Lab 10/19/24 1224  GLUCAP 99    Radiological Exams on Admission: MR BRAIN WO CONTRAST Result Date: 10/19/2024 EXAM: MRI BRAIN WITHOUT CONTRAST 10/19/2024 05:42:17 PM TECHNIQUE: Multiplanar multisequence MRI of the head/brain was performed without the administration of intravenous contrast. COMPARISON: Compared to the CT from earlier the same day. CLINICAL HISTORY: Headache, neuro deficit. FINDINGS: BRAIN AND VENTRICLES: Small 1.2 cm focus of restricted diffusion involving the left basal ganglia, consistent with a small acute ischemic perforator-type infarct (series 2, series 31). Patchy T2 hyperintensity involving the periventricular and deep white matter of both hemispheres, as well as the pons, consistent with chronic small vessel ischemic disease, moderate in nature. Single chronic microhemorrhage noted at the posterior left temporal lobe, of doubtful significance in isolation. No mass. No midline shift. No hydrocephalus. The sella is unremarkable. Normal flow voids. ORBITS: Prior bilateral ocular lens replacement. No acute abnormality. SINUSES AND MASTOIDS: No acute abnormality. BONES AND SOFT TISSUES: Normal marrow signal. No acute soft tissue abnormality. IMPRESSION: 1. 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 2. Moderate chronic small vessel ischemic disease involving the periventricular and deep white matter of both hemispheres and the pons. Electronically signed by: Morene Hoard MD 10/19/2024 06:49 PM EST RP Workstation: [id number]   CT ANGIO HEAD NECK W WO CM Result Date: 10/19/2024 EXAM: CT HEAD WITHOUT CTA HEAD AND NECK WITHOUT AND WITH 10/19/2024 02:42:52 PM TECHNIQUE: CTA of the  head and neck was performed without and with the administration of 75 mL of intravenous iohexol  (OMNIPAQUE ) 350 MG/ML injection. Noncontrast CT of the head with reconstructed 2-D images are also provided for review. Multiplanar 2D and/or 3D reformatted images are provided for review. Automated exposure control, iterative reconstruction, and/or weight based  adjustment of the mA/kV was utilized to reduce the radiation dose to as low as reasonably achievable. COMPARISON: CTA head/neck 08/23/2024 CLINICAL HISTORY: Neuro deficit, acute, stroke suspected. FINDINGS: CT HEAD: BRAIN AND VENTRICLES: No acute intracranial hemorrhage. No mass effect. No midline shift. No extra-axial fluid collection. No evidence of acute infarct. No hydrocephalus. Stable background of moderate chronic small vessel disease and central pattern of volume loss. ORBITS: No acute abnormality. SINUSES AND MASTOIDS: Chronic left sphenoid sinusitis. CTA NECK: AORTIC ARCH AND ARCH VESSELS: No dissection or arterial injury. No significant stenosis of the brachiocephalic or subclavian arteries. CERVICAL CAROTID ARTERIES: No dissection, arterial injury, or hemodynamically significant stenosis by NASCET criteria. CERVICAL VERTEBRAL ARTERIES: No dissection, arterial injury, or significant stenosis. LUNGS AND MEDIASTINUM: Unremarkable. SOFT TISSUES: No acute abnormality. BONES: Prior C5 to C7 ACDF. Hardware is intact. No associated lucency or fracture. No suspicious bone lesion. CTA HEAD: ANTERIOR CIRCULATION: No significant stenosis of the internal carotid arteries. No significant stenosis of the anterior cerebral arteries. No significant stenosis of the middle cerebral arteries. Conical outpouching from the communicating segment of the left ICA, favored to represent a posterior communicating artery infundibulum. POSTERIOR CIRCULATION: No significant stenosis of the posterior cerebral arteries. No significant stenosis of the basilar artery. No significant  stenosis of the vertebral arteries. No aneurysm. OTHER: No dural venous sinus thrombosis on this non-dedicated study. IMPRESSION: 1. No acute intracranial abnormality. 2. No large vessel occlusion or hemodynamically significant stenosis in the head or neck. Electronically signed by: Ryan Chess MD 10/19/2024 02:54 PM EST RP Workstation: [id number]   Assessment/Plan Paula Murphy is a 78 y.o. female with medical history significant for osteoarthritis, fibromyalgia, HLD, hypothyroidism, DDD, macular degeneration, nephrolithiasis and depression who presented to the ED for evaluation of right-sided weakness and admitted for TIA and left basal ganglia infarct.   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 - Patient with no focal neurodeficit on exam, currently at baseline - Neurology consulted, recommended admission for stroke/TIA workup - Recommended DAPT with ASA and Plavix  x 21 days followed by ASA alone - Check echocardiogram - Follow-up lipid panel and A1c - PT/OT/SLP eval-treat - Frequent neurochecks - Telemetry  # HTN - BP elevated with SBP in the 130s to 170s - Patient not on any BP medications at home - Permissive hypertension with BP goal of <220/110 in the first 24 hours  # HLD - Intolerance to statin - Continue Zetia  and follow-up lipid panel  # Fibromyalgia - Continue tramadol  and Topamax   # Hypothyroidism - Continue Synthroid   # GERD - Continue Protonix   # Depression - Continue Lexapro   DVT prophylaxis: Lovenox      Code Status: Full Code  Consults called: Neurology  Family Communication: No family at bedside  Severity of Illness: The appropriate patient status for this patient is OBSERVATION. Observation status is judged to be reasonable and necessary in  order to provide the required intensity of service to ensure the patient's safety. The patient's presenting symptoms, physical exam findings, and initial radiographic and laboratory data in the context of their medical condition is felt to place them at decreased risk for further clinical deterioration. Furthermore, it is anticipated that the patient will be medically stable for discharge from the hospital within 2 midnights of admission.   Level of care: Progressive  Lou Claretta HERO, MD 10/19/2024, 10:34 PM [facility] Pager: [phone] Isaiah 41:10   If 7PM-7AM, please contact night-coverage [email or web address] Password [email or web address]     [1]  Allergies Allergen Reactions   Codeine Nausea And Vomiting    NAUSEA & VOMITING   Hydrocodone Nausea And Vomiting   Plaquenil  [Hydroxychloroquine ]     Vision changes per patient

## 2024-10-19 NOTE — Progress Notes (Deleted)
 Patient name: Paula Murphy MRN: [id number] DOB: 12/29/1945 Sex: female  REASON FOR CONSULT: 4 cm AAA  HPI: Paula Murphy is a 78 y.o. female, with history of hyperlipidemia and fibromyalgia the presents for evaluation of a 4 cm AAA.  Patient had a CT abdomen on 08/23/2024 with a 4 cm aneurysm.  In comparing imaging studies this is increased from 3.2 cm in 2023.  Past Medical History:  Diagnosis Date   Arthritis    OA AND PAIN RT KNEE   Autoimmune disease    + ANA, and DS DNA--PT STATES SHE WAS TOLD SHE DOES NOT HAVE LUPUS AS PREVIOUSLY THOUGHT   Fibromyalgia    followed by Dr. 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   on face   TONSILLECTOMY  11/06/1951   TOTAL KNEE ARTHROPLASTY Right 03/02/2013   Procedure: RIGHT TOTAL KNEE ARTHROPLASTY;  Surgeon: Dempsey LULLA Moan, MD;  Location: [facility];  Service: Orthopedics;  Laterality: Right;   TOTAL KNEE ARTHROPLASTY Left 10/10/2020   Procedure: TOTAL KNEE ARTHROPLASTY;  Surgeon: Moan Dempsey, MD;  Location: [facility];  Service: Orthopedics;  Laterality: Left;     Family History  Problem Relation Age of Onset   Stroke Mother    Hypertension Mother    Hyperlipidemia Mother    Cancer Mother        Lung   Alzheimer's disease Mother    COPD Mother    Stroke Father    Hypertension Father    Diabetes Father        Type II   Hyperlipidemia Father    Cancer Father        Lung   Arthritis Father        Rheumatoid   COPD Father    COPD  Brother    Other Brother        back surgery   Thyroid  disease Daughter    Breast cancer Cousin    Breast cancer Cousin     SOCIAL HISTORY: Social History   Socioeconomic History   Marital status: Divorced    Spouse name: Paula Murphy   Number of children: 1   Years of education: Not on file   Highest education level: Not on file  Occupational History   Occupation: Retired    Associate Professor: RETIRED  Tobacco Use   Smoking status: Never    Passive exposure: Past   Smokeless tobacco: Never  Vaping Use   Vaping status: Never Used  Substance and Sexual Activity   Alcohol use: Yes    Comment: rarely   Drug use: No   Sexual activity: Not on file  Other Topics Concern   Not on file  Social History Narrative   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)   Live in a one story home   Caffeine none   Right Handed   Social Drivers of  Health   Tobacco Use: Low Risk (09/28/2024)   Received from [facility]   Patient History    Smoking Tobacco Use: Never    Smokeless Tobacco Use: Never    Passive Exposure: Not on file  Financial Resource Strain: Not on file  Food Insecurity: Low Risk (09/21/2024)   Received from [facility]   Epic    Within the past 12 months, you worried that your food would run out before you got money to buy more: Never true    Within the past 12 months, the food you bought just didn't last and you didn't have money to get more. : Never true  Transportation Needs: No Transportation Needs (09/21/2024)   Received from [facility]    In the past 12 months, has lack of reliable transportation kept you from medical appointments, meetings, work or from getting things needed for daily living? : No  Physical Activity: Not on file  Stress: Not on file  Social Connections: Unknown (03/16/2022)   Received from [facility]   Social Network    Social Network: Not on file  Intimate Partner Violence: Unknown (02/06/2022)    Received from [facility]   HITS    Physically Hurt: Not on file    Insult or Talk Down To: Not on file    Threaten Physical Harm: Not on file    Scream or Curse: Not on file  Depression (EYV7-0): Not on file  Alcohol Screen: Not on file  Housing: Low Risk (09/21/2024)   Received from [facility]   Epic    What is your living situation today?: I have a steady place to live    Think about the place you live. Do you have problems with any of the following? Choose all that apply:: None/None on this list  Utilities: Low Risk (09/21/2024)   Received from [facility]   Utilities    In the past 12 months has the electric, gas, oil, or water  company threatened to shut off services in your home? : No  Health Literacy: Not on file    Allergies[1]  Current Outpatient Medications  Medication Sig Dispense Refill   amoxicillin (AMOXIL) 500 MG capsule Take 2,000 mg by mouth once. (Patient not taking: Reported on 09/23/2024)     Bisacodyl  (LAXATIVE PO) Take 1 tablet by mouth daily. Vegetable (Patient taking differently: Take 1 tablet by mouth as needed. Vegetable)     Cholecalciferol  (VITAMIN D3) 5000 units CAPS Take 5,000 Units by mouth daily.      denosumab  (PROLIA ) 60 MG/ML SOSY injection Inject 60 mg into the skin every 6 (six) months. CALL PT FOR PAYMENT INFORMATION. Courier to rheum: [address], [place] [address]. Appt on 07/13/2024 1 mL 0   escitalopram  (LEXAPRO ) 10 MG tablet Take 20 mg by mouth daily.     ezetimibe  (ZETIA ) 10 MG tablet Take 10 mg by mouth daily.      fluticasone (FLONASE) 50 MCG/ACT nasal spray Place 1 spray into both nostrils daily as needed for allergies.      GEMTESA 75 MG TABS Take 1 tablet by mouth daily.     ibuprofen  (ADVIL ) 800 MG tablet Take 1 tablet by mouth daily as needed.     levothyroxine  (SYNTHROID ) 150 MCG tablet Take 150 mcg by mouth daily before breakfast.     loperamide  (IMODIUM ) 2 MG capsule Take 1 capsule (2 mg total) by mouth 4  (four) times daily as needed for  diarrhea or loose stools. 12 capsule 0   methocarbamol  (ROBAXIN ) 500 MG tablet Take 500 mg by mouth at bedtime.     Multiple Vitamins-Minerals (PRESERVISION AREDS) CAPS Take 1 capsule by mouth daily.     NYSTATIN EX Apply topically as needed.     ondansetron  (ZOFRAN -ODT) 4 MG disintegrating tablet Take 1 tablet (4 mg total) by mouth every 8 (eight) hours as needed. 20 tablet 0   pantoprazole  (PROTONIX ) 40 MG tablet Take 40 mg by mouth daily.     prochlorperazine  (COMPAZINE ) 5 MG tablet Take 1 tablet (5 mg total) by mouth 2 (two) times daily as needed for up to 10 doses for nausea or vomiting. (Patient not taking: Reported on 09/23/2024) 10 tablet 0   simethicone (MYLICON) 125 MG chewable tablet 125 mg daily as needed for flatulence.      topiramate  (TOPAMAX ) 50 MG tablet TAKE 3 TABLETS BY MOUTH DAILY 270 tablet 0   traMADol  (ULTRAM ) 50 MG tablet Take 50 mg by mouth 3 (three) times daily as needed.     TRIAMCINOLONE  ACETONIDE EX Apply topically as needed.     valACYclovir  (VALTREX ) 500 MG tablet Take 500 mg by mouth daily as needed (fever blister).      Current Facility-Administered Medications  Medication Dose Route Frequency Provider Last Rate Last Admin   [START ON 01/09/2025] denosumab  (PROLIA ) injection 60 mg  60 mg Subcutaneous Q6 months         REVIEW OF SYSTEMS:  [X]  denotes positive finding, [ ]  denotes negative finding Cardiac  Comments:  Chest pain or chest pressure: ***   Shortness of breath upon exertion:    Short of breath when lying flat:    Irregular heart rhythm:        Vascular    Pain in calf, thigh, or hip brought on by ambulation:    Pain in feet at night that wakes you up from your sleep:     Blood clot in your veins:    Leg swelling:         Pulmonary    Oxygen at home:    Productive cough:     Wheezing:         Neurologic    Sudden weakness in arms or legs:     Sudden numbness in arms or legs:     Sudden onset of difficulty  speaking or slurred speech:    Temporary loss of vision in one eye:     Problems with dizziness:         Gastrointestinal    Blood in stool:     Vomited blood:         Genitourinary    Burning when urinating:     Blood in urine:        Psychiatric    Major depression:         Hematologic    Bleeding problems:    Problems with blood clotting too easily:        Skin    Rashes or ulcers:        Constitutional    Fever or chills:      PHYSICAL EXAM: There were no vitals filed for this visit.  GENERAL: The patient is a well-nourished female, in no acute distress. The vital signs are documented above. CARDIAC: There is a regular rate and rhythm.  VASCULAR: *** PULMONARY: There is good air exchange bilaterally without wheezing or rales. ABDOMEN: Soft and non-tender with normal pitched bowel sounds.  MUSCULOSKELETAL: There are no major deformities or cyanosis. NEUROLOGIC: No focal weakness or paresthesias are detected. SKIN: There are no ulcers or rashes noted. PSYCHIATRIC: The patient has a normal affect.  DATA:   CT abdomen pelvis reviewed from 08/23/2024 with 4 cm abdominal aortic aneurysm  Assessment/Plan:  78 y.o. female, with history of hyperlipidemia and fibromyalgia the presents for evaluation of a 4 cm AAA.  Patient had a CT abdomen on 08/23/2024 with a 4 cm aneurysm.  In comparing imaging studies this is increased from 3.2 cm in 2023.  I discussed her aneurysm will require surveillance and not surgical intervention at this time.  I discussed in women we repair these greater than 5 cm unless there is rapid growth.  I do not think 8 mm growth in over 2-1/2 years is significant.  Will continue to follow.  Duplex again in 6 months to continue following.   Lonni DOROTHA Gaskins, MD [facility] Office: [phone]        [1]  Allergies Allergen Reactions   Codeine Nausea And Vomiting    NAUSEA & VOMITING   Hydrocodone Nausea  And Vomiting   Plaquenil  [Hydroxychloroquine ]     Vision changes per patient

## 2024-10-19 NOTE — ED Notes (Signed)
Report given to Charge at [facility].

## 2024-10-19 NOTE — ED Triage Notes (Signed)
 Pt caox4 reporting she woke up and attempted to get out of bed but slid down the side of the bed (without injury) d/t R side weakness. LKW 2130 last night when she went to bed. Denies visual changes or speech abnormalities. R grip strength weakness, R side ataxia, and L facial droop in triage. Denies any recent trauma to the head. Does not take blood thinner.

## 2024-10-19 NOTE — Discharge Instructions (Signed)
 Present to the [facility] emergency department for MRI. Dr. Ula accepting physician.

## 2024-10-19 NOTE — ED Provider Notes (Signed)
 [place] EMERGENCY DEPARTMENT AT [facility] Provider Note   CSN: [id number] Arrival date & time: 10/19/24  1142     Patient presents with: Extremity Weakness   Paula Murphy is a 78 y.o. female with past medical history significant for hyperlipidemia, fibromyalgia, hypertension, who presents with concern for weakness on waking up.  Right sided facial droop, right-sided weakness.  Last known well 2130 last night when she went to bed.  Denies visual changes or speech abnormalities.  Right-sided grip weakness, right-sided ataxia, right-sided facial droop noted on ED arrival.  No recent head trauma, no blood thinners.  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  She reports that she has vascular surgery.    Extremity Weakness       Prior to Admission medications  Medication Sig Start Date End Date Taking? Authorizing Provider  amoxicillin (AMOXIL) 500 MG capsule Take 2,000 mg by mouth once. Patient not taking: Reported on 09/23/2024    [provider]  Bisacodyl  (LAXATIVE PO) Take 1 tablet by mouth daily. Vegetable Patient taking differently: Take 1 tablet by mouth as needed. Vegetable    [provider]  Cholecalciferol  (VITAMIN D3) 5000 units CAPS Take 5,000 Units by mouth daily.     [provider]  denosumab  (PROLIA ) 60 MG/ML SOSY injection Inject 60 mg into the skin every 6 (six) months. CALL PT FOR PAYMENT INFORMATION. Courier to rheum: [address], [address], [place] [address]. Appt on 07/13/2024 06/30/24   Dolphus Reiter, MD  escitalopram  (LEXAPRO ) 10 MG tablet Take 20 mg by mouth daily.    [provider]  ezetimibe  (ZETIA ) 10 MG tablet Take 10 mg by mouth daily.  12/18/16 09/23/24  [provider]  fluticasone (FLONASE) 50 MCG/ACT nasal spray Place 1 spray into both nostrils daily as needed for allergies.  08/21/16   [provider]  GEMTESA 75 MG TABS Take 1 tablet by mouth daily.     [provider]  ibuprofen  (ADVIL ) 800 MG tablet Take 1 tablet by mouth daily as needed. 02/13/24   [provider]  levothyroxine  (SYNTHROID ) 150 MCG tablet Take 150 mcg by mouth daily before breakfast. 03/16/22   [provider]  loperamide  (IMODIUM ) 2 MG capsule Take 1 capsule (2 mg total) by mouth 4 (four) times daily as needed for diarrhea or loose stools. 11/30/21   Long, Fonda MATSU, MD  methocarbamol  (ROBAXIN ) 500 MG tablet Take 500 mg by mouth at bedtime.    [provider]  Multiple Vitamins-Minerals (PRESERVISION AREDS) CAPS Take 1 capsule by mouth daily.    [provider]  NYSTATIN EX Apply topically as needed.    [provider]  ondansetron  (ZOFRAN -ODT) 4 MG disintegrating tablet Take 1 tablet (4 mg total) by mouth every 8 (eight) hours as needed. 11/30/21   Long, Fonda MATSU, MD  pantoprazole  (PROTONIX ) 40 MG tablet Take 40 mg by mouth daily. 09/07/20   [provider]  prochlorperazine  (COMPAZINE ) 5 MG tablet Take 1 tablet (5 mg total) by mouth 2 (two) times daily as needed for up to 10 doses for nausea or vomiting. Patient not taking: Reported on 09/23/2024 08/23/24   Ruthe Cornet, DO  simethicone (MYLICON) 125 MG chewable tablet 125 mg daily as needed for flatulence.  05/27/14   [provider]  topiramate  (TOPAMAX ) 50 MG tablet TAKE 3 TABLETS BY MOUTH DAILY 04/15/24   Dolphus Reiter, MD  traMADol  (ULTRAM ) 50 MG tablet Take 50 mg  by mouth 3 (three) times daily as needed.    [provider]  TRIAMCINOLONE  ACETONIDE EX Apply topically as needed.    [provider]  valACYclovir  (VALTREX ) 500 MG tablet Take 500 mg by mouth daily as needed (fever blister).  05/26/14   [provider]    Allergies: Codeine, Hydrocodone, and Plaquenil  [hydroxychloroquine ]    Review of Systems  Musculoskeletal:  Positive for extremity weakness.  All other systems reviewed and are negative.   Updated Vital  Signs BP (!) 157/66   Pulse 67   Temp 98.1 F (36.7 C) (Oral)   Resp 15   Ht 5' (1.524 m)   Wt 66.2 kg   SpO2 100%   BMI 28.51 kg/m   Physical Exam Vitals and nursing note reviewed.  Constitutional:      General: She is not in acute distress.    Appearance: Normal appearance.  HENT:     Head: Normocephalic and atraumatic.  Eyes:     General:        Right eye: No discharge.        Left eye: No discharge.  Cardiovascular:     Rate and Rhythm: Normal rate and regular rhythm.     Heart sounds: No murmur heard.    No friction rub. No gallop.  Pulmonary:     Effort: Pulmonary effort is normal.     Breath sounds: Normal breath sounds.  Abdominal:     General: Bowel sounds are normal.     Palpations: Abdomen is soft.  Skin:    General: Skin is warm and dry.     Capillary Refill: Capillary refill takes less than 2 seconds.  Neurological:     Mental Status: She is alert and oriented to person, place, and time.     Comments: Right-sided facial droop noted.  Question some mild right-sided pronator drift, subtle right upper and lower extremity weakness.  She endorses normal sensation bilaterally of upper extremities, face, but does endorse feeling some slight decrease in sensation of her right leg compared to her left.  Psychiatric:        Mood and Affect: Mood normal.        Behavior: Behavior normal.     (all labs ordered are listed, but only abnormal results are displayed) Labs Reviewed  COMPREHENSIVE METABOLIC PANEL WITH GFR - Abnormal; Notable for the following components:      Result Value   Glucose, Bld 110 (*)    All other components within normal limits  PROTIME-INR  APTT  CBC  DIFFERENTIAL  ETHANOL  CBG MONITORING, ED    EKG: EKG Interpretation Date/Time:  Monday October 19 2024 12:49:42 EST Ventricular Rate:  67 PR Interval:  197 QRS Duration:  93 QT Interval:  415 QTC Calculation: 439 R Axis:   -37  Text Interpretation: Sinus rhythm Probable left  atrial enlargement Inferior infarct, old Consider anterior infarct Confirmed by Bari Flank [phone]) on 10/19/2024 2:34:43 PM  Radiology: CT ANGIO HEAD NECK W WO CM Result Date: 10/19/2024 EXAM: CT HEAD WITHOUT CTA HEAD AND NECK WITHOUT AND WITH 10/19/2024 02:42:52 PM TECHNIQUE: CTA of the head and neck was performed without and with the administration of 75 mL of intravenous iohexol  (OMNIPAQUE ) 350 MG/ML injection. Noncontrast CT of the head with reconstructed 2-D images are also provided for review. Multiplanar 2D and/or 3D reformatted images are provided for review. Automated exposure control, iterative reconstruction, and/or weight based adjustment of the mA/kV was utilized to reduce the  radiation dose to as low as reasonably achievable. COMPARISON: CTA head/neck 08/23/2024 CLINICAL HISTORY: Neuro deficit, acute, stroke suspected. FINDINGS: CT HEAD: BRAIN AND VENTRICLES: No acute intracranial hemorrhage. No mass effect. No midline shift. No extra-axial fluid collection. No evidence of acute infarct. No hydrocephalus. Stable background of moderate chronic small vessel disease and central pattern of volume loss. ORBITS: No acute abnormality. SINUSES AND MASTOIDS: Chronic left sphenoid sinusitis. CTA NECK: AORTIC ARCH AND ARCH VESSELS: No dissection or arterial injury. No significant stenosis of the brachiocephalic or subclavian arteries. CERVICAL CAROTID ARTERIES: No dissection, arterial injury, or hemodynamically significant stenosis by NASCET criteria. CERVICAL VERTEBRAL ARTERIES: No dissection, arterial injury, or significant stenosis. LUNGS AND MEDIASTINUM: Unremarkable. SOFT TISSUES: No acute abnormality. BONES: Prior C5 to C7 ACDF. Hardware is intact. No associated lucency or fracture. No suspicious bone lesion. CTA HEAD: ANTERIOR CIRCULATION: No significant stenosis of the internal carotid arteries. No significant stenosis of the anterior cerebral arteries. No significant stenosis of the middle  cerebral arteries. Conical outpouching from the communicating segment of the left ICA, favored to represent a posterior communicating artery infundibulum. POSTERIOR CIRCULATION: No significant stenosis of the posterior cerebral arteries. No significant stenosis of the basilar artery. No significant stenosis of the vertebral arteries. No aneurysm. OTHER: No dural venous sinus thrombosis on this non-dedicated study. IMPRESSION: 1. No acute intracranial abnormality. 2. No large vessel occlusion or hemodynamically significant stenosis in the head or neck. Electronically signed by: Ryan Chess MD 10/19/2024 02:54 PM EST RP Workstation: [id number]     Procedures   Medications Ordered in the ED  sodium chloride  flush (NS) 0.9 % injection 3 mL (has no administration in time range)  iohexol  (OMNIPAQUE ) 350 MG/ML injection 75 mL (75 mLs Intravenous Contrast Given 10/19/24 1432)                                    Medical Decision Making Amount and/or Complexity of Data Reviewed Labs: ordered. Radiology: ordered.  Risk Prescription drug management.   This patient is a 78 y.o. female  who presents to the ED for concern of weakness, facial droop, stroke like symptoms.   Differential diagnoses prior to evaluation: The emergent differential diagnosis includes, but is not limited to,  CVA, spinal cord injury, ACS, arrhythmia, syncope, orthostatic hypotension, sepsis, hypoglycemia, hypoxia, electrolyte disturbance, endocrine disorder, anemia, environmental exposure, polypharmacy . This is not an exhaustive differential.   Past Medical History / Co-morbidities / Social History: hyperlipidemia, fibromyalgia, hypertension  Physical Exam: Physical exam performed. The pertinent findings include: Right-sided facial droop noted.  Question some mild right-sided pronator drift, subtle right upper and lower extremity weakness.  She endorses normal sensation bilaterally of upper extremities, face, but does  endorse feeling some slight decrease in sensation of her right leg compared to her left.   Vital signs stable other than some hypertension, blood pressure 157/66  Lab Tests/Imaging studies: I personally interpreted labs/imaging and the pertinent results include: CBC, CMP, ethanol, PT/INR, APTT, CBG all overall unremarkable, CT angio head and neck with without contrast with no evidence of acute abnormality, large vessel occlusion. I agree with the radiologist interpretation.   Given her right-sided facial droop, right-sided weakness will plan for transfer for MRI brain.  I spoke with Dr. Ula at [facility] agrees to ED to ED transfer for MRI, stroke evaluation  Final diagnoses:  Right sided weakness  Facial droop    ED Discharge  Orders     None          Rosan Sherlean DEL, PA-C 10/19/24 1506    Bari Roxie HERO, [place] 10/20/24 [phone]

## 2024-10-20 ENCOUNTER — Observation Stay (HOSPITAL_COMMUNITY)

## 2024-10-20 ENCOUNTER — Encounter (HOSPITAL_COMMUNITY): Payer: Self-pay | Admitting: Student

## 2024-10-20 ENCOUNTER — Ambulatory Visit: Admitting: Vascular Surgery

## 2024-10-20 ENCOUNTER — Ambulatory Visit (HOSPITAL_COMMUNITY)

## 2024-10-20 DIAGNOSIS — R531 Weakness: Principal | ICD-10-CM

## 2024-10-20 DIAGNOSIS — I639 Cerebral infarction, unspecified: Secondary | ICD-10-CM | POA: Diagnosis present

## 2024-10-20 LAB — LIPID PANEL
Cholesterol: 136 mg/dL (ref 0–200)
HDL: 41 mg/dL (ref >40)
LDL Cholesterol: 85 mg/dL (ref 0–99)
Total CHOL/HDL Ratio: 3.3 ratio
Triglycerides: 49 mg/dL (ref <150)
VLDL: 10 mg/dL (ref 0–40)

## 2024-10-20 LAB — ECHOCARDIOGRAM COMPLETE
Area-P 1/2: 5.54 cm2
Height: 60 in
S' Lateral: 2.1 cm
Weight: 2336 [oz_av]

## 2024-10-20 LAB — HEMOGLOBIN A1C
Hgb A1c MFr Bld: 5.3 % (ref 4.8–5.6)
Mean Plasma Glucose: 105.41 mg/dL

## 2024-10-20 LAB — CBC
HCT: 37.1 % (ref 36.0–46.0)
Hemoglobin: 12.1 g/dL (ref 12.0–15.0)
MCH: 31.8 pg (ref 26.0–34.0)
MCHC: 32.6 g/dL (ref 30.0–36.0)
MCV: 97.4 fL (ref 80.0–100.0)
Platelets: 188 K/uL (ref 150–400)
RBC: 3.81 MIL/uL — ABNORMAL LOW (ref 3.87–5.11)
RDW: 14.1 % (ref 11.5–15.5)
WBC: 6.2 K/uL (ref 4.0–10.5)
nRBC: 0 % (ref 0.0–0.2)

## 2024-10-20 LAB — BASIC METABOLIC PANEL WITH GFR
Anion gap: 7 (ref 5–15)
BUN: 13 mg/dL (ref 8–23)
CO2: 20 mmol/L — ABNORMAL LOW (ref 22–32)
Calcium: 8 mg/dL — ABNORMAL LOW (ref 8.9–10.3)
Chloride: 113 mmol/L — ABNORMAL HIGH (ref 98–111)
Creatinine, Ser: 0.68 mg/dL (ref 0.44–1.00)
GFR, Estimated: >60 mL/min (ref >60)
Glucose, Bld: 101 mg/dL — ABNORMAL HIGH (ref 70–99)
Potassium: 3.6 mmol/L (ref 3.5–5.1)
Sodium: 140 mmol/L (ref 135–145)

## 2024-10-20 MED ORDER — ASPIRIN 81 MG PO TBEC
81.0000 mg | DELAYED_RELEASE_TABLET | Freq: Every day | ORAL | Status: DC
  Administered 2024-10-20 – 2024-10-28 (×9): 81 mg via ORAL
  Filled 2024-10-20 (×10): qty 1

## 2024-10-20 MED ORDER — LEVOTHYROXINE SODIUM 75 MCG PO TABS
150.0000 ug | ORAL_TABLET | Freq: Every day | ORAL | Status: DC
  Administered 2024-10-20 – 2024-10-28 (×8): 150 ug via ORAL
  Filled 2024-10-20 (×9): qty 2

## 2024-10-20 MED ORDER — CLOPIDOGREL BISULFATE 75 MG PO TABS
75.0000 mg | ORAL_TABLET | Freq: Every day | ORAL | Status: DC
  Administered 2024-10-20 – 2024-10-28 (×9): 75 mg via ORAL
  Filled 2024-10-20 (×9): qty 1

## 2024-10-20 NOTE — Progress Notes (Signed)
 Inpatient Rehab Admissions Coordinator Note:   Per therapy recommendations patient was screened for CIR candidacy by Reche FORBES Lowers, PT. At this time, pt appears to be a potential candidate for CIR. I will place an order for rehab consult for full assessment, per our protocol.  Please contact me any with questions.SABRA Reche Lowers, PT, DPT [phone] 10/20/2024 4:03 PM

## 2024-10-20 NOTE — ED Notes (Signed)
Wheeled patient to the bathroom patient is now back in bed on the monitor with family at bedside and call bell in reach

## 2024-10-20 NOTE — Progress Notes (Signed)
 Echocardiogram 2D Echocardiogram has been performed.  Merlynn Argyle 10/20/2024, 10:22 AM

## 2024-10-20 NOTE — Progress Notes (Signed)
 PROGRESS NOTE    Paula Murphy  FMW:[id number] DOB: May 20, 1946 DOA: 10/19/2024 PCP: Nichole Senior, MD   Brief Narrative:  HPI: Paula Murphy is a 78 y.o. female with medical history significant for osteoarthritis, fibromyalgia, HLD, hypothyroidism, DDD, macular degeneration, nephrolithiasis and depression who presented to the ED for evaluation of right-sided weakness. Patient reports she was at her usual state of health last night before going to bed. When she woke up this morning, she noticed weakness in her right leg so she slid out of bed. She was unable to get off the floor due to weakness in her right arm as well. She called her daughter who assisted her to get back. She initially presented to [facility] ED for evaluation, she received a CT scan and was sent to the [facility] ED for MRI brain. Reports her right-sided weakness has significantly improved but not at 100 percent. She denies any numbness, tingling, vision changes, headache, chest pain, shortness of breath or falls.   ED Course: Initial vitals show patient afebrile but hypertensive with SBP in the 140s to 170s. Initial labs overall unremarkable with normal CBC CMP and INR. EKG shows sinus rhythm.  CTA head and neck shows no acute acute abnormality and no LVO.  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 Neurology and vascular surgery were consulted for evaluation. TRH was consulted for admission.   Assessment & Plan:   Principal Problem:   TIA (transient ischemic attack) Active Problems:   Mixed hyperlipidemia   Infarction of left basal ganglia (HCC)   Right sided weakness  # Acute CVA/left basal ganglia infarct/hyperlipidemia/hypertension Patient presented with acute onset of right-sided weakness lasted several hours before resolving. MRI of the brain showed small acute ischemic nonhemorrhagic perforator type infarct in the left basal ganglia.  Neuro consulted, started  on aspirin  and Plavix , recommendation to continue both for 21 days followed by aspirin  alone.  Hemoglobin A1c pending, LDL 85, goal<70.  Will start on atorvastatin 40 mg.  Patient is still leans towards the left side and has some weakness in the right leg.  Echo pending.  Was being seen by OT when I saw her however PT assessment is still pending but it appears that patient may benefit from CIR.  Allow permissive hypertension.  Patient was not on any antihypertensives PTA.   # Fibromyalgia - Continue tramadol  and Topamax    # Hypothyroidism - Continue Synthroid , will check TSH.   # GERD - Continue Protonix    # Depression - Continue Lexapro   DVT prophylaxis: enoxaparin  (LOVENOX ) injection 40 mg Start: 10/20/24 1000   Code Status: Full Code  Family Communication: Daughter present at bedside.  Plan of care discussed with patient in length and he/she verbalized understanding and agreed with it.  Status is: Observation The patient will require care spanning > 2 midnights and should be moved to inpatient because: Needs complete workup of stroke   Estimated body mass index is 28.51 kg/m as calculated from the following:   Height as of this encounter: 5' (1.524 m).   Weight as of this encounter: 66.2 kg.    Nutritional Assessment: Body mass index is 28.51 kg/m.SABRA Seen by dietician.  I agree with the assessment and plan as outlined below: Nutrition Status:        . Skin Assessment: I have examined the patient's skin and I agree with the wound assessment as performed by the wound care RN as outlined below:  Consultants:  Neurology  Procedures:  None  Antimicrobials:  Anti-infectives (From admission, onward)    None         Subjective: Patient seen and examined, daughter at the bedside.  Patient denied any weakness in the right upper or lower extremity however she was noted to have right lower extremity weakness and leaning towards the left side.  No problem with the  speech, facial droop or swallowing.  Objective: Vitals:   10/20/24 0607 10/20/24 0726 10/20/24 0730 10/20/24 0800  BP: (!) 144/74  135/69 (!) 148/58  Pulse: 73  71 99  Resp: 17  14 16   Temp: 97.9 F (36.6 C) 98.2 F (36.8 C)    TempSrc: Oral Oral    SpO2: 99%  93% 95%  Weight:      Height:       No intake or output data in the 24 hours ending 10/20/24 0957 Filed Weights   10/19/24 1227  Weight: 66.2 kg    Examination:  General exam: Appears calm and comfortable  Respiratory system: Clear to auscultation. Respiratory effort normal. Cardiovascular system: S1 & S2 heard, RRR. No JVD, murmurs, rubs, gallops or clicks. No pedal edema. Gastrointestinal system: Abdomen is nondistended, soft and nontender. No organomegaly or masses felt. Normal bowel sounds heard. Central nervous system: Alert and oriented.  4/5 power in right lower extremity. Extremities: Symmetric 5 x 5 power. Skin: No rashes, lesions or ulcers Psychiatry: Judgement and insight appear normal. Mood & affect appropriate.    Data Reviewed: I have personally reviewed following labs and imaging studies  CBC: Recent Labs  Lab 10/19/24 1304 10/20/24 0250  WBC 7.3 6.2  NEUTROABS 4.7  --   HGB 13.0 12.1  HCT 39.9 37.1  MCV 96.1 97.4  PLT 213 188   Basic Metabolic Panel: Recent Labs  Lab 10/19/24 1304 10/20/24 0250  NA 142 140  K 3.9 3.6  CL 109 113*  CO2 25 20*  GLUCOSE 110* 101*  BUN 15 13  CREATININE 0.79 0.68  CALCIUM 9.6 8.0*   GFR: Estimated Creatinine Clearance: 50 mL/min (by C-G formula based on SCr of 0.68 mg/dL). Liver Function Tests: Recent Labs  Lab 10/19/24 1304  AST 18  ALT 10  ALKPHOS 56  BILITOT 0.3  PROT 6.7  ALBUMIN 3.8   No results for input(s): LIPASE, AMYLASE in the last 168 hours. No results for input(s): AMMONIA in the last 168 hours. Coagulation Profile: Recent Labs  Lab 10/19/24 1304  INR 1.0   Cardiac Enzymes: No results for input(s): CKTOTAL,  CKMB, CKMBINDEX, TROPONINI in the last 168 hours. BNP (last 3 results) No results for input(s): PROBNP in the last 8760 hours. HbA1C: No results for input(s): HGBA1C in the last 72 hours. CBG: Recent Labs  Lab 10/19/24 1224  GLUCAP 99   Lipid Profile: Recent Labs    10/20/24 0249  CHOL 136  HDL 41  LDLCALC 85  TRIG 49  CHOLHDL 3.3   Thyroid  Function Tests: No results for input(s): TSH, T4TOTAL, FREET4, T3FREE, THYROIDAB in the last 72 hours. Anemia Panel: No results for input(s): VITAMINB12, FOLATE, FERRITIN, TIBC, IRON, RETICCTPCT in the last 72 hours. Sepsis Labs: No results for input(s): PROCALCITON, LATICACIDVEN in the last 168 hours.  No results found for this or any previous visit (from the past 240 hours).   Radiology Studies: MR BRAIN WO CONTRAST Result Date: 10/19/2024 EXAM: MRI BRAIN WITHOUT CONTRAST 10/19/2024 05:42:17 PM TECHNIQUE: Multiplanar multisequence MRI of the head/brain was performed  without the administration of intravenous contrast. COMPARISON: Compared to the CT from earlier the same day. CLINICAL HISTORY: Headache, neuro deficit. FINDINGS: BRAIN AND VENTRICLES: Small 1.2 cm focus of restricted diffusion involving the left basal ganglia, consistent with a small acute ischemic perforator-type infarct (series 2, series 31). Patchy T2 hyperintensity involving the periventricular and deep white matter of both hemispheres, as well as the pons, consistent with chronic small vessel ischemic disease, moderate in nature. Single chronic microhemorrhage noted at the posterior left temporal lobe, of doubtful significance in isolation. No mass. No midline shift. No hydrocephalus. The sella is unremarkable. Normal flow voids. ORBITS: Prior bilateral ocular lens replacement. No acute abnormality. SINUSES AND MASTOIDS: No acute abnormality. BONES AND SOFT TISSUES: Normal marrow signal. No acute soft tissue abnormality. IMPRESSION: 1. Small  acute ischemic nonhemorrhagic perforator-type infarct in the left basal ganglia. 2. Moderate chronic small vessel ischemic disease involving the periventricular and deep white matter of both hemispheres and the pons. Electronically signed by: Morene Hoard MD 10/19/2024 06:49 PM EST RP Workstation: [id number]   CT ANGIO HEAD NECK W WO CM Result Date: 10/19/2024 EXAM: CT HEAD WITHOUT CTA HEAD AND NECK WITHOUT AND WITH 10/19/2024 02:42:52 PM TECHNIQUE: CTA of the head and neck was performed without and with the administration of 75 mL of intravenous iohexol  (OMNIPAQUE ) 350 MG/ML injection. Noncontrast CT of the head with reconstructed 2-D images are also provided for review. Multiplanar 2D and/or 3D reformatted images are provided for review. Automated exposure control, iterative reconstruction, and/or weight based adjustment of the mA/kV was utilized to reduce the radiation dose to as low as reasonably achievable. COMPARISON: CTA head/neck 08/23/2024 CLINICAL HISTORY: Neuro deficit, acute, stroke suspected. FINDINGS: CT HEAD: BRAIN AND VENTRICLES: No acute intracranial hemorrhage. No mass effect. No midline shift. No extra-axial fluid collection. No evidence of acute infarct. No hydrocephalus. Stable background of moderate chronic small vessel disease and central pattern of volume loss. ORBITS: No acute abnormality. SINUSES AND MASTOIDS: Chronic left sphenoid sinusitis. CTA NECK: AORTIC ARCH AND ARCH VESSELS: No dissection or arterial injury. No significant stenosis of the brachiocephalic or subclavian arteries. CERVICAL CAROTID ARTERIES: No dissection, arterial injury, or hemodynamically significant stenosis by NASCET criteria. CERVICAL VERTEBRAL ARTERIES: No dissection, arterial injury, or significant stenosis. LUNGS AND MEDIASTINUM: Unremarkable. SOFT TISSUES: No acute abnormality. BONES: Prior C5 to C7 ACDF. Hardware is intact. No associated lucency or fracture. No suspicious bone lesion. CTA HEAD:  ANTERIOR CIRCULATION: No significant stenosis of the internal carotid arteries. No significant stenosis of the anterior cerebral arteries. No significant stenosis of the middle cerebral arteries. Conical outpouching from the communicating segment of the left ICA, favored to represent a posterior communicating artery infundibulum. POSTERIOR CIRCULATION: No significant stenosis of the posterior cerebral arteries. No significant stenosis of the basilar artery. No significant stenosis of the vertebral arteries. No aneurysm. OTHER: No dural venous sinus thrombosis on this non-dedicated study. IMPRESSION: 1. No acute intracranial abnormality. 2. No large vessel occlusion or hemodynamically significant stenosis in the head or neck. Electronically signed by: Ryan Chess MD 10/19/2024 02:54 PM EST RP Workstation: [id number]    Scheduled Meds:   stroke: early stages of recovery book   Does not apply Once   aspirin  EC  81 mg Oral Daily   cholecalciferol   5,000 Units Oral Daily   clopidogrel   75 mg Oral Daily   [START ON 01/09/2025] denosumab   60 mg Subcutaneous Q6 months   enoxaparin  (LOVENOX ) injection  40 mg Subcutaneous Q24H   escitalopram   10 mg Oral Daily   ezetimibe   10 mg Oral Daily   levothyroxine   150 mcg Oral Q0600   mirabegron  ER  25 mg Oral Daily   pantoprazole   40 mg Oral Daily   sodium chloride  flush  3 mL Intravenous Once   topiramate   150 mg Oral QHS   Continuous Infusions:  sodium chloride  40 mL/hr at 10/20/24 0803     LOS: 0 days   Fredia Skeeter, MD [facility]  10/20/2024, 9:57 AM   *Please note that this is a verbal dictation therefore any spelling or grammatical errors are due to the Dragon Medical One system interpretation.  Please page via Amion and do not message via secure chat for urgent patient care matters. Secure chat can be used for non urgent patient care matters.  How to contact the [facility] Attending or Consulting provider 7A - 7P or covering provider during  after hours 7P -7A, for this patient?  Check the care team in [facility] and look for a) attending/consulting [facility] provider listed and b) the [facility] team listed. Page or secure chat 7A-7P. Log into [email or web address] and use [place]'s universal password to access. If you do not have the password, please contact the hospital operator. Locate the [facility] provider you are looking for under [facility] and page to a number that you can be directly reached. If you still have difficulty reaching the provider, please page the [facility] (Director on Call) for the Hospitalists listed on amion for assistance.

## 2024-10-20 NOTE — Care Management Obs Status (Signed)
 MEDICARE OBSERVATION STATUS NOTIFICATION   Patient Details  Name: Paula Murphy MRN: [id number] Date of Birth: Oct 29, 1946   Medicare Observation Status Notification Given:  Yes    Camelia JONETTA Cary, RN 10/20/2024, 3:19 PM

## 2024-10-20 NOTE — Evaluation (Signed)
 Physical Therapy Evaluation Patient Details Name: Paula Murphy MRN: [id number] DOB: [birth year] Today's Date: 10/20/2024  History of Present Illness  78 yo F adm 10/19/24 with R facial droop and R side weakness. MRI(+) L basal ganglia CVA. NIH 0 PMH: HLD, macular degeneration, nephrolithiasis, hypothyroidism, HTN, fibromyalgia, LTKA  Clinical Impression  Pt admitted with above diagnosis. Pt from home where she lives alone in an apartment with family support in town.  Pt has had LLE weakness since 2nd covid vaccine in 2021 but now with new R sided weakness and L lean. Needed mod A for safe ambulation without AD. Was more steady with RW but still needed progressively more assist with increasing distance due to fatigue. Pt with balance deficits and high fall risk. Patient will benefit from intensive inpatient follow-up therapy, >3 hours/day to return to independence. Pt currently with functional limitations due to the deficits listed below (see PT Problem List). Pt will benefit from acute skilled PT to increase their independence and safety with mobility to allow discharge.           If plan is discharge home, recommend the following: A little help with walking and/or transfers;A little help with bathing/dressing/bathroom;Assistance with cooking/housework;Assist for transportation   Can travel by private vehicle        Equipment Recommendations None recommended by PT  Recommendations for Other Services  Rehab consult    Functional Status Assessment Patient has had a recent decline in their functional status and demonstrates the ability to make significant improvements in function in a reasonable and predictable amount of time.     Precautions / Restrictions Precautions Precautions: Fall Recall of Precautions/Restrictions: Intact Restrictions Weight Bearing Restrictions Per Provider Order: No      Mobility  Bed Mobility Overal bed mobility: Needs Assistance Bed Mobility: Sit to  Supine       Sit to supine: Min assist   General bed mobility comments: min A to LE's for return to stretcher. Once in supine able to bridge hips to scoot    Transfers Overall transfer level: Needs assistance Equipment used: Rolling walker (2 wheels), None Transfers: Sit to/from Stand Sit to Stand: Min assist           General transfer comment: needs UE support for safe transfers, unsteady in standing    Ambulation/Gait Ambulation/Gait assistance: Mod assist Gait Distance (Feet): 40 Feet (3x) Assistive device: Rolling walker (2 wheels) Gait Pattern/deviations: Shuffle, Step-through pattern, Decreased stride length Gait velocity: decreased Gait velocity interpretation: <1.31 ft/sec, indicative of household ambulator   General Gait Details: step length only ~6 in and  step height only 2 in. Mod A needed without AD due to imbalance and very slow mvmts and inability to correct for LOB. More steady with use of RW but needed increased assistance with increased distance due to fatigue. L lean throughout, pt able to correct with vc's but only for short time.  Stairs            Wheelchair Mobility     Tilt Bed    Modified Rankin (Stroke Patients Only)       Balance Overall balance assessment: Needs assistance Sitting-balance support: No upper extremity supported, Feet supported Sitting balance-Leahy Scale: Poor Sitting balance - Comments: left lateral lean with activity--reported on one occassion she knew she was leaning--did not self correct until task was complete   Standing balance support: Bilateral upper extremity supported, Reliant on assistive device for balance Standing balance-Leahy Scale: Poor Standing balance comment: pt  unable to accept challenge or correct for LOB in timeframe sufficient to prevent falling                             Pertinent Vitals/Pain Pain Assessment Pain Assessment: 0-10 Pain Score: 6  Pain Location: back and other  places due to fibromyalgia and has been NPO so has not had meds yet Pain Descriptors / Indicators: Aching, Sore Pain Intervention(s): Limited activity within patient's tolerance, Monitored during session    Home Living Family/patient expects to be discharged to:: Private residence Living Arrangements: Alone Available Help at Discharge: Family;Available PRN/intermittently Type of Home: House Home Access: Level entry       Home Layout: One level Home Equipment: Agricultural Consultant (2 wheels);Rollator (4 wheels);Shower seat;Grab bars - tub/shower;Hand held shower head Additional Comments: LLE weakness ever since her 2nd COVID shot in 2021. Family lives nearby, very supportive    Prior Function Prior Level of Function : Independent/Modified Independent;Driving                     Extremity/Trunk Assessment   Upper Extremity Assessment Upper Extremity Assessment: Defer to OT evaluation    Lower Extremity Assessment Lower Extremity Assessment: Generalized weakness;RLE deficits/detail;LLE deficits/detail RLE Deficits / Details: hip flex 3-/5, knee ext 3/5 RLE Sensation: decreased proprioception RLE Coordination: decreased gross motor LLE Deficits / Details: hip flex 3/5, knee ext 3+/5 LLE Sensation: decreased proprioception LLE Coordination: decreased gross motor    Cervical / Trunk Assessment Cervical / Trunk Assessment: Kyphotic  Communication   Communication Communication: No apparent difficulties    Cognition Arousal: Alert Behavior During Therapy: WFL for tasks assessed/performed   PT - Cognitive impairments: Problem solving                       PT - Cognition Comments: slow processing Following commands: Intact       Cueing Cueing Techniques: Verbal cues     General Comments General comments (skin integrity, edema, etc.): HR 120 bpm, BP 149/92, SPO2 100% on RA. Daughter present for session.    Exercises     Assessment/Plan    PT Assessment  Patient needs continued PT services  PT Problem List Decreased strength;Decreased activity tolerance;Decreased balance;Decreased mobility;Decreased coordination;Cardiopulmonary status limiting activity;Pain       PT Treatment Interventions DME instruction;Gait training;Functional mobility training;Therapeutic activities;Therapeutic exercise;Balance training;Neuromuscular re-education;Patient/family education    PT Goals (Current goals can be found in the Care Plan section)  Acute Rehab PT Goals Patient Stated Goal: return home PT Goal Formulation: With patient Time For Goal Achievement: 11/03/24 Potential to Achieve Goals: Good    Frequency Min 2X/week     Co-evaluation               AM-PAC PT 6 Clicks Mobility  Outcome Measure Help needed turning from your back to your side while in a flat bed without using bedrails?: None Help needed moving from lying on your back to sitting on the side of a flat bed without using bedrails?: A Little Help needed moving to and from a bed to a chair (including a wheelchair)?: A Little Help needed standing up from a chair using your arms (e.g., wheelchair or bedside chair)?: A Lot Help needed to walk in hospital room?: A Lot Help needed climbing 3-5 steps with a railing? : A Lot 6 Click Score: 16    End of Session Equipment Utilized During Treatment:  Gait belt Activity Tolerance: Patient limited by fatigue Patient left: in bed;with call bell/phone within reach;with family/visitor present Nurse Communication: Mobility status PT Visit Diagnosis: Unsteadiness on feet (R26.81);Difficulty in walking, not elsewhere classified (R26.2);Pain Pain - part of body:  (back)    Time: 9099-9071 PT Time Calculation (min) (ACUTE ONLY): 28 min   Charges:   PT Evaluation $PT Eval Moderate Complexity: 1 Mod PT Treatments $Gait Training: 8-22 mins PT General Charges $$ ACUTE PT VISIT: 1 Visit         Richerd Lipoma, PT  Acute Rehab  Services Secure chat preferred Office [phone]   Richerd CROME Shahmeer Bunn 10/20/2024, 12:49 PM

## 2024-10-20 NOTE — ED Notes (Signed)
 CCMD called for cardiac monitoring.

## 2024-10-20 NOTE — Evaluation (Signed)
 Occupational Therapy Evaluation Patient Details Name: Paula Murphy MRN: [id number] DOB: 1945-11-06 Today's Date: 10/20/2024   History of Present Illness   78 yo F adm 10/19/24 with R facial droop and R side weakness. MRI(+) L basal ganglia CVA. NIH 0 PMH: HLD, macular degeneration, nephrolithiasis, hypothyroidism, HTN, fibromyalgia, LTKA     Clinical Impressions This 78 yo female admitted with above presents to acute OT with PLOF of living alone and being independent to Mod I for basic ADLs, IADLs, and driving. Currently she is min A-CGA  for basic ADLs and min A for ambulation without AD (holding onto IV pole and intermittently holding onto furniture/door frames, handrails with other hand). She has weakness on the left LE ever since her 2nd covid shot in 2021 and this is currently even weaker than normal; as well as when she is seated and also trying to do a task (ie: putting on and tying her shoes) she has a left lateral lean. She will continue to benefit from acute OT with follow up from intensive inpatient follow-up therapy, >3 hours/day to get her back to her PLOF in a timely manner where she can be home alone again--which a stay on AIR would do for her (no family can stay with her 24/7). We will continue to follow her acutely.      If plan is discharge home, recommend the following:   A little help with walking and/or transfers;A little help with bathing/dressing/bathroom;Assistance with cooking/housework;Assist for transportation;Help with stairs or ramp for entrance     Functional Status Assessment   Patient has had a recent decline in their functional status and demonstrates the ability to make significant improvements in function in a reasonable and predictable amount of time.     Equipment Recommendations   None recommended by OT     Recommendations for Other Services   Rehab consult     Precautions/Restrictions   Precautions Precautions: Fall Recall of  Precautions/Restrictions: Intact Restrictions Weight Bearing Restrictions Per Provider Order: No     Mobility Bed Mobility Overal bed mobility: Needs Assistance Bed Mobility: Supine to Sit     Supine to sit: Contact guard, HOB elevated     General bed mobility comments: increased time, on stretcher    Transfers Overall transfer level: Needs assistance Equipment used: 1 person hand held assist Transfers: Sit to/from Stand Sit to Stand: Min assist                  Balance Overall balance assessment: Needs assistance Sitting-balance support: No upper extremity supported, Feet supported Sitting balance-Leahy Scale: Poor Sitting balance - Comments: left lateral lean with activity--reported on one occassion she knew she was leaning--did not self correct until task was complete   Standing balance support: Bilateral upper extremity supported, Reliant on assistive device for balance Standing balance-Leahy Scale: Poor                             ADL either performed or assessed with clinical judgement   ADL Overall ADL's : Needs assistance/impaired Eating/Feeding: Independent Eating/Feeding Details (indicate cue type and reason): supported sitting Grooming: Set up Grooming Details (indicate cue type and reason): supported sitting Upper Body Bathing: Set up Upper Body Bathing Details (indicate cue type and reason): supported sitting Lower Body Bathing: Minimal assistance Lower Body Bathing Details (indicate cue type and reason): CGA sit<>stand Upper Body Dressing : Set up Upper Body Dressing Details (indicate cue type  and reason): supported sitting Lower Body Dressing: Minimal assistance Lower Body Dressing Details (indicate cue type and reason): CGA sit<>stand Toilet Transfer: Minimal assistance;Ambulation;Comfort height toilet;Grab bars   Toileting- Clothing Manipulation and Hygiene: Minimal assistance Toileting - Clothing Manipulation Details (indicate  cue type and reason): CGA sit <>stand             Vision Baseline Vision/History: 1 Wears glasses (reading) Ability to See in Adequate Light: 0 Adequate Patient Visual Report: No change from baseline Vision Assessment?: Yes Eye Alignment: Within Functional Limits Ocular Range of Motion: Within Functional Limits Alignment/Gaze Preference: Within Defined Limits Tracking/Visual Pursuits: Able to track stimulus in all quads without difficulty Saccades: Within functional limits Convergence: Within functional limits Visual Fields: No apparent deficits Additional Comments: does tend to lean left with visual testing            Pertinent Vitals/Pain Pain Assessment Pain Assessment: 0-10 Pain Score: 6  Pain Location: back and other places due to fibromyalgia and has been NPO so has not had meds yet Pain Descriptors / Indicators: Aching, Sore Pain Intervention(s): Limited activity within patient's tolerance, Monitored during session, Repositioned     Extremity/Trunk Assessment Upper Extremity Assessment Upper Extremity Assessment: Right hand dominant           Communication Communication Communication: No apparent difficulties   Cognition Arousal: Alert Behavior During Therapy: WFL for tasks assessed/performed Cognition: No apparent impairments                               Following commands: Intact       Cueing   Cueing Techniques: Verbal cues              Home Living Family/patient expects to be discharged to:: Private residence Living Arrangements: Alone Available Help at Discharge: Family;Available PRN/intermittently Type of Home: House Home Access: Level entry     Home Layout: One level     Bathroom Shower/Tub: Tub/shower unit;Curtain   Firefighter: Standard     Home Equipment: Agricultural Consultant (2 wheels);Rollator (4 wheels);Shower seat;Grab bars - tub/shower;Hand held shower head   Additional Comments: LLE weakness ever since  her 2nd COVID shot in 2021      Prior Functioning/Environment Prior Level of Function : Independent/Modified Independent;Driving                    OT Problem List: Decreased strength;Decreased activity tolerance;Impaired balance (sitting and/or standing);Decreased coordination   OT Treatment/Interventions: Self-care/ADL training;DME and/or AE instruction;Patient/family education;Balance training      OT Goals(Current goals can be found in the care plan section)   Acute Rehab OT Goals Patient Stated Goal: to go home OT Goal Formulation: With patient Time For Goal Achievement: 11/03/24 Potential to Achieve Goals: Good   OT Frequency:  Min 2X/week       AM-PAC OT 6 Clicks Daily Activity     Outcome Measure Help from another person eating meals?: None Help from another person taking care of personal grooming?: A Little Help from another person toileting, which includes using toliet, bedpan, or urinal?: A Little Help from another person bathing (including washing, rinsing, drying)?: A Little Help from another person to put on and taking off regular upper body clothing?: A Little Help from another person to put on and taking off regular lower body clothing?: A Little 6 Click Score: 19   End of Session Equipment Utilized During Treatment: Gait belt (pushing IV  pole and holding onto rail in hallway) Nurse Communication: Mobility status (recommending AIR)  Activity Tolerance: Patient tolerated treatment well Patient left: in chair;with call bell/phone within reach;with family/visitor present  OT Visit Diagnosis: Unsteadiness on feet (R26.81);Other abnormalities of gait and mobility (R26.89);Muscle weakness (generalized) (M62.81)                Time: 0825-0900 OT Time Calculation (min): 35 min Charges:  OT General Charges $OT Visit: 1 Visit OT Evaluation $OT Eval Moderate Complexity: 1 Mod OT Treatments $Self Care/Home Management : 8-22 mins  Donny BECKER OT Acute  Rehabilitation Services Office [phone]   Rodgers Dorothyann Distel 10/20/2024, 9:19 AM

## 2024-10-20 NOTE — Progress Notes (Signed)
°   10/20/24 1523  TOC Brief Assessment  Insurance and Status Reviewed  Patient has primary care physician Yes  Home environment has been reviewed lives alone from home  Prior level of function: independent  Prior/Current Home Services No current home services  Social Drivers of Health Review SDOH reviewed no interventions necessary  Readmission risk has been reviewed Yes  Transition of care needs no transition of care needs at this time   Current DME walker cane

## 2024-10-20 NOTE — ED Notes (Signed)
 ECHO at bedside.

## 2024-10-20 NOTE — Progress Notes (Addendum)
 STROKE TEAM PROGRESS NOTE    SIGNIFICANT HOSPITAL EVENTS   INTERIM HISTORY/SUBJECTIVE  Stroke occurred 12/15 between 4am and 9:30am which presented with R arm and leg weakness. MRI showed small L BG stroke. Seemed to return to baseline with bed neuro exam, but still has some difficulty with ambulation. Patient is unable to tolerate statins, and Zetia  has been insufficient to manage her LDL level.  Recommend consider using PCSK9 inhibitor injections  OBJECTIVE  CBC    Component Value Date/Time   WBC 6.2 10/20/2024 0250   RBC 3.81 (L) 10/20/2024 0250   HGB 12.1 10/20/2024 0250   HCT 37.1 10/20/2024 0250   PLT 188 10/20/2024 0250   MCV 97.4 10/20/2024 0250   MCH 31.8 10/20/2024 0250   MCHC 32.6 10/20/2024 0250   RDW 14.1 10/20/2024 0250   LYMPHSABS 2.0 10/19/2024 1304   MONOABS 0.4 10/19/2024 1304   EOSABS 0.2 10/19/2024 1304   BASOSABS 0.0 10/19/2024 1304    BMET    Component Value Date/Time   NA 140 10/20/2024 0250   K 3.6 10/20/2024 0250   CL 113 (H) 10/20/2024 0250   CO2 20 (L) 10/20/2024 0250   GLUCOSE 101 (H) 10/20/2024 0250   BUN 13 10/20/2024 0250   CREATININE 0.68 10/20/2024 0250   CREATININE 0.80 09/23/2024 1222   CALCIUM 8.0 (L) 10/20/2024 0250   EGFR 76 09/23/2024 1222   GFRNONAA >60 10/20/2024 0250   GFRNONAA 73 11/30/2020 1132    IMAGING past 24 hours MR BRAIN WO CONTRAST Result Date: 10/19/2024 EXAM: MRI BRAIN WITHOUT CONTRAST 10/19/2024 05:42:17 PM TECHNIQUE: Multiplanar multisequence MRI of the head/brain was performed without the administration of intravenous contrast. COMPARISON: Compared to the CT from earlier the same day. CLINICAL HISTORY: Headache, neuro deficit. FINDINGS: BRAIN AND VENTRICLES: Small 1.2 cm focus of restricted diffusion involving the left basal ganglia, consistent with a small acute ischemic perforator-type infarct (series 2, series 31). Patchy T2 hyperintensity involving the periventricular and deep white matter of both  hemispheres, as well as the pons, consistent with chronic small vessel ischemic disease, moderate in nature. Single chronic microhemorrhage noted at the posterior left temporal lobe, of doubtful significance in isolation. No mass. No midline shift. No hydrocephalus. The sella is unremarkable. Normal flow voids. ORBITS: Prior bilateral ocular lens replacement. No acute abnormality. SINUSES AND MASTOIDS: No acute abnormality. BONES AND SOFT TISSUES: Normal marrow signal. No acute soft tissue abnormality. IMPRESSION: 1. 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 2. Moderate chronic small vessel ischemic disease involving the periventricular and deep white matter of both hemispheres and the pons. Electronically signed by: Morene Hoard MD 10/19/2024 06:49 PM EST RP Workstation: [id number]   CT ANGIO HEAD NECK W WO CM Result Date: 10/19/2024 EXAM: CT HEAD WITHOUT CTA HEAD AND NECK WITHOUT AND WITH 10/19/2024 02:42:52 PM TECHNIQUE: CTA of the head and neck was performed without and with the administration of 75 mL of intravenous iohexol  (OMNIPAQUE ) 350 MG/ML injection. Noncontrast CT of the head with reconstructed 2-D images are also provided for review. Multiplanar 2D and/or 3D reformatted images are provided for review. Automated exposure control, iterative reconstruction, and/or weight based adjustment of the mA/kV was utilized to reduce the radiation dose to as low as reasonably achievable. COMPARISON: CTA head/neck 08/23/2024 CLINICAL HISTORY: Neuro deficit, acute, stroke suspected. FINDINGS: CT HEAD: BRAIN AND VENTRICLES: No acute intracranial hemorrhage. No mass effect. No midline shift. No extra-axial fluid collection. No evidence of acute infarct. No hydrocephalus. Stable background  of moderate chronic small vessel disease and central pattern of volume loss. ORBITS: No acute abnormality. SINUSES AND MASTOIDS: Chronic left sphenoid sinusitis. CTA NECK: AORTIC  ARCH AND ARCH VESSELS: No dissection or arterial injury. No significant stenosis of the brachiocephalic or subclavian arteries. CERVICAL CAROTID ARTERIES: No dissection, arterial injury, or hemodynamically significant stenosis by NASCET criteria. CERVICAL VERTEBRAL ARTERIES: No dissection, arterial injury, or significant stenosis. LUNGS AND MEDIASTINUM: Unremarkable. SOFT TISSUES: No acute abnormality. BONES: Prior C5 to C7 ACDF. Hardware is intact. No associated lucency or fracture. No suspicious bone lesion. CTA HEAD: ANTERIOR CIRCULATION: No significant stenosis of the internal carotid arteries. No significant stenosis of the anterior cerebral arteries. No significant stenosis of the middle cerebral arteries. Conical outpouching from the communicating segment of the left ICA, favored to represent a posterior communicating artery infundibulum. POSTERIOR CIRCULATION: No significant stenosis of the posterior cerebral arteries. No significant stenosis of the basilar artery. No significant stenosis of the vertebral arteries. No aneurysm. OTHER: No dural venous sinus thrombosis on this non-dedicated study. IMPRESSION: 1. No acute intracranial abnormality. 2. No large vessel occlusion or hemodynamically significant stenosis in the head or neck. Electronically signed by: Ryan Chess MD 10/19/2024 02:54 PM EST RP Workstation: [id number]    Vitals:   10/20/24 0240 10/20/24 0508 10/20/24 0607 10/20/24 0726  BP: (!) 146/76  (!) 144/74   Pulse: 79  73   Resp: 14 16 17    Temp: 98.4 F (36.9 C)  97.9 F (36.6 C) 98.2 F (36.8 C)  TempSrc: Oral  Oral Oral  SpO2: 98%  99%   Weight:      Height:         PHYSICAL EXAM General:  Alert, well-nourished, well-developed patient in no acute distress Psych:  Mood and affect appropriate for situation CV: Regular rate and rhythm on monitor Respiratory:  Regular, unlabored respirations on room air   NEURO:  Mental Status: AA&Ox3, patient is able to give clear  and coherent history Speech/Language: speech is without dysarthria or aphasia.  Naming, repetition, fluency, and comprehension intact.  Cranial Nerves:  II: PERRL. Visual fields full.  III, IV, VI: EOMI. Eyelids elevate symmetrically.  V: Sensation is intact to light touch and symmetrical to face.  VII: Face is symmetrical resting and smiling VIII: hearing intact to voice. IX, X: Palate elevates symmetrically. Phonation is normal.  KP:Dynloizm shrug 5/5. XII: tongue is midline without fasciculations. Motor: 5/5 strength to all muscle groups tested.  Tone: is normal and bulk is normal Sensation- Intact to light touch bilaterally. Extinction absent to light touch to DSS.   Coordination: FTN intact bilaterally, HKS: no ataxia in BLE.No drift.  Gait- deferred  Most Recent NIH 0     ASSESSMENT/PLAN  Ms. ADALEY KIENE is a 78 y.o. female with history of HLD, macular degeneration, osteoarthritis, fibromyalgia, hypothyroidism, DDD admitted for right-sided weakness.  NIH on Admission 0  Acute Ischemic Infarct:  left basal ganglia Etiology: Likely small vessel disease CTA head & neck: No large vessel occulusions or significant stenosis. MRI: Small acute ischemic infarct in L BG. Moderate chronic small vessel ischemic disease throughout both hemispheres and pons. 2D Echo: 60-65%, atria normal in size LDL 85 HgbA1c: 5.3 VTE prophylaxis - Lovenox  No antithrombotic prior to admission, now on aspirin  81 mg daily and clopidogrel  75 mg daily for 3 weeks and then ASA alone. Therapy recommendations:  Pending Disposition:  Pending  Hx of Stroke/TIA no  Atrial fibrillation EKG showed sinus rhythym. Echo showed normal  sized atria. Continue telemetry monitoring  Hypertension Hx of HTN, but no longer hypertensive Home meds:  none Stable Blood pressure long-term goal: 130/90  Hyperlipidemia Home meds:  Zetia , resumed in hospital Pt has a history of myalgias with statins LDL 85, goal <  70 Highly recommend starting a PSK9 inhibitor, such as Repatha, in the outpatient setting for better cholesterol control and decrease future episode risk.  Diabetes type II - n/a HgbA1c 5.7, goal < 7.0  Tobacco Abuse - n/a  Substance Abuse - n/a  Dysphagia Patient has post-stroke dysphagia, SLP consulted    Diet   Diet NPO time specified  Advance diet as tolerated  Hospital day # 0  Pt can be discharged from a neurology standpoint pending PT/OT recs and her decision on whether or not to enroll in an appropriate clinical trial. ASA + Plavix  for 3 weeks then ASA alone. Recommend PCP start patient on Repatha or another PSK9 inhibitor for better LDL control.  CHARM Penne Mori, DO, PGY1 Neurology Stroke Team  I have personally obtained history,examined this patient, reviewed notes, independently viewed imaging studies, participated in medical decision making and plan of care.ROS completed by me personally and pertinent positives fully documented  I have made any additions or clarifications directly to the above note. Agree with note above.  Presented with right leg weakness due to left basal ganglia lacunar infarct from small vessel disease.  Recommend aspirin  and Plavix  for 3 weeks followed by aspirin  alone and aggressive risk factor modification.  Patient has elevated lipid has statin intolerance and recommend injection to Repatha or Praluent as an outpatient for more optimal lipid management.  Mobilize out of bed.  Therapy consult.  Discussed with Dr.Pahwani.   I personally spent a total of 50 minutes in the care of the patient today including getting/reviewing separately obtained history, performing a medically appropriate exam/evaluation, counseling and educating, placing orders, referring and communicating with other health care professionals, documenting clinical information in the EHR, independently interpreting results, and coordinating care.         Eather Popp, MD Medical  Director [facility] Pager: [phone] 10/20/2024 2:55 PM    To contact Stroke Continuity provider, please refer to [email or web address]. After hours, contact General Neurology

## 2024-10-21 DIAGNOSIS — Z79890 Hormone replacement therapy (postmenopausal): Secondary | ICD-10-CM | POA: Diagnosis not present

## 2024-10-21 DIAGNOSIS — I10 Essential (primary) hypertension: Secondary | ICD-10-CM | POA: Diagnosis present

## 2024-10-21 DIAGNOSIS — Z96653 Presence of artificial knee joint, bilateral: Secondary | ICD-10-CM | POA: Diagnosis present

## 2024-10-21 DIAGNOSIS — R112 Nausea with vomiting, unspecified: Secondary | ICD-10-CM | POA: Diagnosis present

## 2024-10-21 DIAGNOSIS — R531 Weakness: Secondary | ICD-10-CM | POA: Diagnosis not present

## 2024-10-21 DIAGNOSIS — G459 Transient cerebral ischemic attack, unspecified: Secondary | ICD-10-CM | POA: Diagnosis not present

## 2024-10-21 DIAGNOSIS — R1310 Dysphagia, unspecified: Secondary | ICD-10-CM | POA: Diagnosis present

## 2024-10-21 DIAGNOSIS — K5900 Constipation, unspecified: Secondary | ICD-10-CM | POA: Diagnosis not present

## 2024-10-21 DIAGNOSIS — R270 Ataxia, unspecified: Secondary | ICD-10-CM | POA: Diagnosis present

## 2024-10-21 DIAGNOSIS — E559 Vitamin D deficiency, unspecified: Secondary | ICD-10-CM | POA: Diagnosis present

## 2024-10-21 DIAGNOSIS — Z741 Need for assistance with personal care: Secondary | ICD-10-CM | POA: Diagnosis present

## 2024-10-21 DIAGNOSIS — I639 Cerebral infarction, unspecified: Secondary | ICD-10-CM | POA: Diagnosis not present

## 2024-10-21 DIAGNOSIS — I671 Cerebral aneurysm, nonruptured: Secondary | ICD-10-CM | POA: Diagnosis present

## 2024-10-21 DIAGNOSIS — R29810 Facial weakness: Secondary | ICD-10-CM | POA: Diagnosis present

## 2024-10-21 DIAGNOSIS — I6381 Other cerebral infarction due to occlusion or stenosis of small artery: Secondary | ICD-10-CM | POA: Diagnosis present

## 2024-10-21 DIAGNOSIS — Z90710 Acquired absence of both cervix and uterus: Secondary | ICD-10-CM | POA: Diagnosis not present

## 2024-10-21 DIAGNOSIS — H3530 Unspecified macular degeneration: Secondary | ICD-10-CM | POA: Diagnosis present

## 2024-10-21 DIAGNOSIS — K219 Gastro-esophageal reflux disease without esophagitis: Secondary | ICD-10-CM | POA: Diagnosis present

## 2024-10-21 DIAGNOSIS — Z7902 Long term (current) use of antithrombotics/antiplatelets: Secondary | ICD-10-CM | POA: Diagnosis not present

## 2024-10-21 DIAGNOSIS — Z7982 Long term (current) use of aspirin: Secondary | ICD-10-CM | POA: Diagnosis not present

## 2024-10-21 DIAGNOSIS — E039 Hypothyroidism, unspecified: Secondary | ICD-10-CM | POA: Diagnosis present

## 2024-10-21 DIAGNOSIS — F32A Depression, unspecified: Secondary | ICD-10-CM | POA: Diagnosis present

## 2024-10-21 DIAGNOSIS — R29701 NIHSS score 1: Secondary | ICD-10-CM | POA: Diagnosis present

## 2024-10-21 DIAGNOSIS — F419 Anxiety disorder, unspecified: Secondary | ICD-10-CM | POA: Diagnosis present

## 2024-10-21 DIAGNOSIS — E782 Mixed hyperlipidemia: Secondary | ICD-10-CM | POA: Diagnosis present

## 2024-10-21 DIAGNOSIS — G8191 Hemiplegia, unspecified affecting right dominant side: Secondary | ICD-10-CM | POA: Diagnosis present

## 2024-10-21 DIAGNOSIS — M797 Fibromyalgia: Secondary | ICD-10-CM | POA: Diagnosis present

## 2024-10-21 MED ORDER — TRAMADOL HCL 50 MG PO TABS
50.0000 mg | ORAL_TABLET | Freq: Three times a day (TID) | ORAL | Status: DC | PRN
  Administered 2024-10-23 – 2024-10-27 (×8): 50 mg via ORAL
  Filled 2024-10-21 (×9): qty 1

## 2024-10-21 MED ORDER — ONDANSETRON HCL 4 MG/2ML IJ SOLN
4.0000 mg | Freq: Four times a day (QID) | INTRAMUSCULAR | Status: DC | PRN
  Administered 2024-10-21 (×2): 4 mg via INTRAVENOUS
  Filled 2024-10-21 (×2): qty 2

## 2024-10-21 MED ORDER — PROCHLORPERAZINE EDISYLATE 10 MG/2ML IJ SOLN
10.0000 mg | Freq: Four times a day (QID) | INTRAMUSCULAR | Status: DC | PRN
  Administered 2024-10-21: 21:00:00 10 mg via INTRAVENOUS
  Filled 2024-10-21: qty 2

## 2024-10-21 MED ORDER — TRAMADOL HCL 50 MG PO TABS
100.0000 mg | ORAL_TABLET | Freq: Three times a day (TID) | ORAL | Status: DC | PRN
  Administered 2024-10-21: 13:00:00 100 mg via ORAL
  Filled 2024-10-21: qty 2

## 2024-10-21 NOTE — Progress Notes (Signed)
 SLP Cancellation Note  Patient Details Name: MILCA SYTSMA MRN: [id number] DOB: [birth year]   Cancelled treatment:       Reason Eval/Treat Not Completed: SLP screened, no needs identified, will sign off   Chamille Werntz, Consuelo Fitch 10/21/2024, 3:41 PM

## 2024-10-21 NOTE — Progress Notes (Signed)
° ° °  Inpatient Rehabilitation Admissions Coordinator   Met with patient and friend, Arley, at bedside for rehab assessment. We discussed goals and expectations of a possible CIR admit. They prefer CIR for rehab. Patient's daughter can stay at night and Arley and another friend can provide supervision during the day. I will ask Dr Lorilee to consult and then I will begin insurance Auth with [facility] Medicare for possible CIR admit pending approval. Please call me with any questions.   Heron Leavell, RN, MSN Rehab Admissions Coordinator [phone]

## 2024-10-21 NOTE — Plan of Care (Signed)

## 2024-10-21 NOTE — Hospital Course (Signed)
 Paula Murphy is a 78 y.o. female with a history of osteoarthritis, fibromyalgia, hyperlipidemia, hypothyroidism, degenerative disc disease, depression, nephrolithiasis.  Patient presented secondary to right-sided weakness with initial concern for TIA versus stroke.  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  Patient started on dual antiplatelet therapy with aspirin  and Plavix .  Plan for discharge to acute inpatient rehab.

## 2024-10-21 NOTE — Progress Notes (Signed)
 Occupational Therapy Treatment Patient Details Name: Paula Murphy MRN: [id number] DOB: 1946-09-04 Today's Date: 10/21/2024   History of present illness 78 yo F adm 10/19/24 with R facial droop and R side weakness. MRI(+) L basal ganglia CVA. NIH 0 PMH: HLD, macular degeneration, nephrolithiasis, hypothyroidism, HTN, fibromyalgia, LTKA   OT comments  Pt making good progress towards OT goals. Pt requesting bathroom assist on entry. Pt able to mobilize to/from bathroom without AD with CGA. Pt able to manage toileting tasks and UB ADLs with no more than Supervision. No significant L lateral lean noted today in comparison to yesterday's therapy sessions. Anticipate good progress with continued OOB activity. Hopeful to have progressed activity out of room but unable to obtain portable tele box at this time. Pt does report some concerns about managing at home alone and hopeful that intensive rehab >3 hours per day would return her to complete independence.       If plan is discharge home, recommend the following:  A little help with walking and/or transfers;A little help with bathing/dressing/bathroom;Assistance with cooking/housework;Assist for transportation;Help with stairs or ramp for entrance   Equipment Recommendations  None recommended by OT    Recommendations for Other Services Rehab consult    Precautions / Restrictions Precautions Precautions: Fall Recall of Precautions/Restrictions: Intact Restrictions Weight Bearing Restrictions Per Provider Order: No       Mobility Bed Mobility Overal bed mobility: Needs Assistance Bed Mobility: Supine to Sit, Sit to Supine     Supine to sit: Supervision Sit to supine: Supervision        Transfers Overall transfer level: Needs assistance Equipment used: None Transfers: Sit to/from Stand Sit to Stand: Supervision                 Balance Overall balance assessment: Needs assistance Sitting-balance support: No upper  extremity supported, Feet supported Sitting balance-Leahy Scale: Fair     Standing balance support: No upper extremity supported, During functional activity Standing balance-Leahy Scale: Fair                             ADL either performed or assessed with clinical judgement   ADL Overall ADL's : Needs assistance/impaired                 Upper Body Dressing : Set up;Sitting       Toilet Transfer: Contact guard Nurse, Adult Details (indicate cue type and reason): CGA to ambulate to regular toilet, declined need for AD. able to stand from toilet without assist Toileting- Clothing Manipulation and Hygiene: Supervision/safety;Sitting/lateral lean;Sit to/from stand Toileting - Clothing Manipulation Details (indicate cue type and reason): able to manage depends without assist     Functional mobility during ADLs: Contact guard assist      Extremity/Trunk Assessment Upper Extremity Assessment Upper Extremity Assessment: Generalized weakness;Right hand dominant   Lower Extremity Assessment Lower Extremity Assessment: Defer to PT evaluation        Vision   Vision Assessment?: No apparent visual deficits   Perception     Praxis     Communication Communication Communication: No apparent difficulties   Cognition Arousal: Alert Behavior During Therapy: WFL for tasks assessed/performed Cognition: No apparent impairments                               Following commands: Intact  Cueing   Cueing Techniques: Verbal cues  Exercises      Shoulder Instructions       General Comments      Pertinent Vitals/ Pain       Pain Assessment Pain Assessment: No/denies pain  Home Living                                          Prior Functioning/Environment              Frequency  Min 2X/week        Progress Toward Goals  OT Goals(current goals can now be found in the care  plan section)  Progress towards OT goals: Progressing toward goals  Acute Rehab OT Goals Patient Stated Goal: hopeful to continue improving to go home OT Goal Formulation: With patient Time For Goal Achievement: 11/03/24 Potential to Achieve Goals: Good ADL Goals Pt Will Perform Grooming: Independently;standing Pt Will Perform Upper Body Bathing: Independently;standing;sitting Pt Will Perform Lower Body Bathing: Independently;sit to/from stand Pt Will Perform Upper Body Dressing: Independently;sitting;standing Pt Will Perform Lower Body Dressing: Independently;sit to/from stand Pt Will Transfer to Toilet: Independently;ambulating;grab bars Pt Will Perform Toileting - Clothing Manipulation and hygiene: Independently;sit to/from stand Additional ADL Goal #1: Pt will be independent with in and OOB for basic ADLs Additional ADL Goal #2: Pt will not lean while seated/standing for ADLs  Plan      Co-evaluation                 AM-PAC OT 6 Clicks Daily Activity     Outcome Measure   Help from another person eating meals?: None Help from another person taking care of personal grooming?: A Little Help from another person toileting, which includes using toliet, bedpan, or urinal?: A Little Help from another person bathing (including washing, rinsing, drying)?: A Little Help from another person to put on and taking off regular upper body clothing?: A Little Help from another person to put on and taking off regular lower body clothing?: A Little 6 Click Score: 19    End of Session Equipment Utilized During Treatment: Gait belt  OT Visit Diagnosis: Unsteadiness on feet (R26.81);Other abnormalities of gait and mobility (R26.89);Muscle weakness (generalized) (M62.81)   Activity Tolerance Patient tolerated treatment well   Patient Left in bed;with call bell/phone within reach;with bed alarm set   Nurse Communication Mobility status;Other (comment) (need for portable tele monitor)         Time: 8879-8862 OT Time Calculation (min): 17 min  Charges: OT General Charges $OT Visit: 1 Visit OT Treatments $Self Care/Home Management : 8-22 mins  Mliss NOVAK, OTR/L Acute Rehab Services Office: [phone]   Mliss Fish 10/21/2024, 12:09 PM

## 2024-10-21 NOTE — Progress Notes (Signed)
 [facility] night cross cover note:   Prn iv compazine  added for n/v refractory to prn iv zofran .      Eva Pore, DO Hospitalist

## 2024-10-21 NOTE — Consult Note (Signed)
 Physical Medicine and Rehabilitation Consult Reason for Consult: L basal ganglia infarction Referring Physician: Elgin Lam, MD   HPI: Paula Murphy is a 78 y.o. female who was admitted on 10/19/24 with a right sided facial droop and right sided weakness. 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 PMH includes HLD, macular degeneration, nephrolithiasis, hypothyroidism, HTN, fibromyalgia, and L TKA. Physical Medicine & Rehabilitation was consulted to assess candidacy for CIR.      Home: Home Living Family/patient expects to be discharged to:: Private residence Living Arrangements: Alone Available Help at Discharge: Family, Available PRN/intermittently Type of Home: House Home Access: Level entry Home Layout: One level Bathroom Shower/Tub: Tub/shower unit, Engineer, Building Services: Standard Home Equipment: Agricultural Consultant (2 wheels), Rollator (4 wheels), Shower seat, Grab bars - tub/shower, Hand held shower head Additional Comments: LLE weakness ever since her 2nd COVID shot in 2021. Family lives nearby, very supportive  Functional History: Prior Function Prior Level of Function : Independent/Modified Independent, Driving Functional Status:  Mobility: Bed Mobility Overal bed mobility: Needs Assistance Bed Mobility: Supine to Sit, Sit to Supine Supine to sit: Supervision, HOB elevated Sit to supine: Supervision, HOB elevated General bed mobility comments: Pt sat up on L side of bed with increased time. She brought BLE off EOB and slowly elevated trunk. Returning to bed pt swung BLE back in and repositioned herself by scooting upwards pushing with BLE. Transfers Overall transfer level: Needs assistance Equipment used: Rolling walker (2 wheels) Transfers: Sit to/from Stand, Bed to chair/wheelchair/BSC Sit to Stand: Contact guard assist, Supervision Bed to/from chair/wheelchair/BSC transfer type:: Step pivot Step pivot transfers: Contact guard assist General transfer  comment: Pt stood from lowest bed height. Cued proper hand placement using RW. Powered up with CGA. Transferred to/from the bathroom. Cues for proximity to RW and navigating the tight space. Pt completed sit<>stand x5 reps with CGA for safety. Good eccentric control. Ambulation/Gait Ambulation/Gait assistance: Contact guard assist Gait Distance (Feet): 100 Feet (with intermittent standing rest break) Assistive device: Rolling walker (2 wheels) Gait Pattern/deviations: Step-to pattern, Decreased step length - right, Decreased step length - left, Decreased stride length, Decreased dorsiflexion - left, Shuffle General Gait Details: Pt ambulated with short slow steps. She maintained upright posture, but required cues for proximity to RW. Pt lacked left ankle DF achieving minimal foot clearance. As she fatigued, her steps became significantly shorter and she began to shuffle. No lean, but some unsteadiness noted by postural sway. Cues for safety throughout mobility. Pt took standing rest breaks to recover. Gait velocity: decreased Gait velocity interpretation: <1.31 ft/sec, indicative of household ambulator    ADL: ADL Overall ADL's : Needs assistance/impaired Eating/Feeding: Independent Eating/Feeding Details (indicate cue type and reason): supported sitting Grooming: Set up Grooming Details (indicate cue type and reason): supported sitting Upper Body Bathing: Set up Upper Body Bathing Details (indicate cue type and reason): supported sitting Lower Body Bathing: Minimal assistance Lower Body Bathing Details (indicate cue type and reason): CGA sit<>stand Upper Body Dressing : Set up, Sitting Upper Body Dressing Details (indicate cue type and reason): supported sitting Lower Body Dressing: Minimal assistance Lower Body Dressing Details (indicate cue type and reason): CGA sit<>stand Toilet Transfer: Contact guard assist, Ambulation, Regular Toilet Toilet Transfer Details (indicate cue type and  reason): CGA to ambulate to regular toilet, declined need for AD. able to stand from toilet without assist Toileting- Clothing Manipulation and Hygiene: Supervision/safety, Sitting/lateral lean, Sit to/from stand Toileting - Clothing Manipulation Details (indicate  cue type and reason): able to manage depends without assist Functional mobility during ADLs: Contact guard assist  Cognition: Cognition Orientation Level: Oriented X4 Cognition Arousal: Alert Behavior During Therapy: WFL for tasks assessed/performed   ROS: denies pain Past Medical History:  Diagnosis Date   Arthritis    OA AND PAIN RT KNEE   Autoimmune disease    + ANA, and DS DNA--PT STATES SHE WAS TOLD SHE DOES NOT HAVE LUPUS AS PREVIOUSLY THOUGHT   Fibromyalgia    followed by Dr. 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   on face   TONSILLECTOMY  11/06/1951   TOTAL KNEE ARTHROPLASTY Right 03/02/2013   Procedure: RIGHT TOTAL KNEE ARTHROPLASTY;  Surgeon: Dempsey LULLA Moan, MD;  Location: [facility];  Service: Orthopedics;  Laterality: Right;   TOTAL KNEE ARTHROPLASTY Left 10/10/2020   Procedure: TOTAL KNEE ARTHROPLASTY;  Surgeon: Moan Dempsey, MD;  Location: [facility];  Service: Orthopedics;  Laterality: Left;    Family History  Problem Relation Age of Onset   Stroke Mother    Hypertension Mother    Hyperlipidemia Mother    Cancer Mother        Lung   Alzheimer's disease Mother    COPD Mother    Stroke Father     Hypertension Father    Diabetes Father        Type II   Hyperlipidemia Father    Cancer Father        Lung   Arthritis Father        Rheumatoid   COPD Father    COPD Brother    Other Brother        back surgery   Thyroid  disease Daughter    Breast cancer Cousin    Breast cancer Cousin    Social History:  reports that she has never smoked. She has been exposed to tobacco smoke. She has never used smokeless tobacco. She reports current alcohol use. She reports that she does not use drugs. Allergies: Allergies[1] Facility-Administered Medications Prior to Admission  Medication Dose Route Frequency Provider Last Rate Last Admin   [START ON 01/09/2025] denosumab  (PROLIA ) injection 60 mg  60 mg Subcutaneous Q6 months        Medications Prior to Admission  Medication Sig Dispense Refill   ALPRAZolam  (XANAX  XR) 0.5 MG 24 hr tablet Take 0.5 mg by mouth daily.     Cholecalciferol  (VITAMIN D3) 5000 units CAPS Take 5,000 Units by mouth daily.      denosumab  (PROLIA ) 60 MG/ML SOSY injection Inject 60 mg into the skin every 6 (six) months. CALL PT FOR PAYMENT INFORMATION. Courier to rheum: [address], [place] [address]. Appt on 07/13/2024 1 mL 0   escitalopram  (LEXAPRO ) 10 MG tablet Take 10 mg by mouth daily.     ezetimibe  (ZETIA ) 10 MG tablet Take 10 mg by mouth daily.  fluticasone (FLONASE) 50 MCG/ACT nasal spray Place 1 spray into both nostrils daily as needed for allergies.      GEMTESA 75 MG TABS Take 1 tablet by mouth daily.     ibuprofen  (ADVIL ) 800 MG tablet Take 1 tablet by mouth daily as needed.     levothyroxine  (SYNTHROID ) 150 MCG tablet Take 150 mcg by mouth daily before breakfast.     loperamide  (IMODIUM ) 2 MG capsule Take 1 capsule (2 mg total) by mouth 4 (four) times daily as needed for diarrhea or loose stools. 12 capsule 0   methocarbamol  (ROBAXIN ) 500 MG tablet Take 500 mg by mouth at bedtime as needed for muscle spasms.     Multiple Vitamins-Minerals  (PRESERVISION AREDS) CAPS Take 1 capsule by mouth daily.     ondansetron  (ZOFRAN -ODT) 4 MG disintegrating tablet Take 1 tablet (4 mg total) by mouth every 8 (eight) hours as needed. 20 tablet 0   pantoprazole  (PROTONIX ) 40 MG tablet Take 40 mg by mouth daily.     topiramate  (TOPAMAX ) 50 MG tablet TAKE 3 TABLETS BY MOUTH DAILY (Patient taking differently: Take 150 mg by mouth at bedtime.) 270 tablet 0   traMADol  (ULTRAM ) 50 MG tablet Take 50 mg by mouth 3 (three) times daily as needed for moderate pain (pain score 4-6).     valACYclovir  (VALTREX ) 500 MG tablet Take 500 mg by mouth daily as needed (fever blister).      prochlorperazine  (COMPAZINE ) 5 MG tablet Take 1 tablet (5 mg total) by mouth 2 (two) times daily as needed for up to 10 doses for nausea or vomiting. (Patient not taking: No sig reported) 10 tablet 0     Blood pressure 139/67, pulse 81, temperature 97.8 F (36.6 C), temperature source Oral, resp. rate 18, height 5' (1.524 m), weight 66.2 kg, SpO2 98%. Physical Exam Gen: no distress, normal appearing HEENT: oral mucosa pink and moist, NCAT Cardio: Reg rate Chest: normal effort, normal rate of breathing Abd: soft, non-distended Ext: no edema Psych: pleasant, normal affect Skin: intact Neuro: Alert and oriented x3.Strength is 4+/5 throughout. Sensation is intact throughout.  No results found for this or any previous visit (from the past 24 hours). ECHOCARDIOGRAM COMPLETE Result Date: 10/20/2024    ECHOCARDIOGRAM REPORT   Patient Name:   Paula Murphy Beason Date of Exam: 10/20/2024 Medical Rec #:  [id number]       Height:       60.0 in Accession #:    [id number]      Weight:       146.0 lb Date of Birth:  01-24-1946       BSA:          1.633 m Patient Age:    78 years        BP:           115/70 mmHg Patient Gender: F               HR:           69 bpm. Exam Location:  Inpatient Procedure: 2D Echo, Cardiac Doppler and Color Doppler (Both Spectral and Color            Flow Doppler were  utilized during procedure). Indications:    Stroke 163.9  History:        Patient has no prior history of Echocardiogram examinations.                 Risk Factors:Dyslipidemia.  Sonographer:  Merlynn Argyle Referring Phys: [id number] PROSPER M AMPONSAH  Sonographer Comments: Image acquisition challenging due to respiratory motion. IMPRESSIONS  1. Left ventricular ejection fraction, by estimation, is 60 to 65%. The left ventricle has normal function. The left ventricle has no regional wall motion abnormalities. Left ventricular diastolic parameters are consistent with Grade I diastolic dysfunction (impaired relaxation).  2. Right ventricular systolic function is normal. The right ventricular size is normal. There is normal pulmonary artery systolic pressure. The estimated right ventricular systolic pressure is 23.4 mmHg.  3. The mitral valve is normal in structure. No evidence of mitral valve regurgitation. No evidence of mitral stenosis.  4. The aortic valve is tricuspid. Aortic valve regurgitation is not visualized. No aortic stenosis is present.  5. The inferior vena cava is normal in size with greater than 50% respiratory variability, suggesting right atrial pressure of 3 mmHg. FINDINGS  Left Ventricle: Left ventricular ejection fraction, by estimation, is 60 to 65%. The left ventricle has normal function. The left ventricle has no regional wall motion abnormalities. The left ventricular internal cavity size was normal in size. There is  no left ventricular hypertrophy. Left ventricular diastolic parameters are consistent with Grade I diastolic dysfunction (impaired relaxation). Normal left ventricular filling pressure. Right Ventricle: The right ventricular size is normal. No increase in right ventricular wall thickness. Right ventricular systolic function is normal. There is normal pulmonary artery systolic pressure. The tricuspid regurgitant velocity is 2.26 m/s, and  with an assumed right atrial pressure of 3  mmHg, the estimated right ventricular systolic pressure is 23.4 mmHg. Left Atrium: Left atrial size was normal in size. Right Atrium: Right atrial size was normal in size. Pericardium: There is no evidence of pericardial effusion. Presence of epicardial fat layer. Mitral Valve: The mitral valve is normal in structure. No evidence of mitral valve regurgitation. No evidence of mitral valve stenosis. Tricuspid Valve: The tricuspid valve is normal in structure. Tricuspid valve regurgitation is not demonstrated. No evidence of tricuspid stenosis. Aortic Valve: The aortic valve is tricuspid. Aortic valve regurgitation is not visualized. No aortic stenosis is present. Pulmonic Valve: The pulmonic valve was not well visualized. Pulmonic valve regurgitation is not visualized. No evidence of pulmonic stenosis. Aorta: The aortic root and ascending aorta are structurally normal, with no evidence of dilitation. Venous: The inferior vena cava is normal in size with greater than 50% respiratory variability, suggesting right atrial pressure of 3 mmHg. IAS/Shunts: No atrial level shunt detected by color flow Doppler.  LEFT VENTRICLE PLAX 2D LVIDd:         3.50 cm   Diastology LVIDs:         2.10 cm   LV e' medial:    11.00 cm/s LV PW:         1.20 cm   LV E/e' medial:  5.5 LV IVS:        1.20 cm   LV e' lateral:   8.08 cm/s LVOT diam:     1.90 cm   LV E/e' lateral: 7.5 LV SV:         54 LV SV Index:   33 LVOT Area:     2.84 cm LV IVRT:       77 msec  RIGHT VENTRICLE             IVC RV Basal diam:  3.60 cm     IVC diam: 1.60 cm RV S prime:     14.90 cm/s TAPSE (M-mode): 1.7 cm LEFT ATRIUM  Index        RIGHT ATRIUM           Index LA diam:        2.40 cm 1.47 cm/m   RA Area:     11.10 cm LA Vol (A2C):   25.7 ml 15.74 ml/m  RA Volume:   25.30 ml  15.49 ml/m LA Vol (A4C):   25.2 ml 15.43 ml/m LA Biplane Vol: 25.7 ml 15.74 ml/m  AORTIC VALVE             PULMONIC VALVE LVOT Vmax:   89.80 cm/s  RVOT Peak grad: 3 mmHg  LVOT Vmean:  66.500 cm/s LVOT VTI:    0.189 m  AORTA Ao Root diam: 3.50 cm Ao Asc diam:  3.70 cm MITRAL VALVE                TRICUSPID VALVE MV Area (PHT): 5.54 cm     TR Peak grad:   20.4 mmHg MV Decel Time: 137 msec     TR Vmax:        226.00 cm/s MV E velocity: 60.20 cm/s MV A velocity: 103.00 cm/s  SHUNTS MV E/A ratio:  0.58         Systemic VTI:  0.19 m                             Systemic Diam: 1.90 cm                             Pulmonic VTI:  0.123 m Jerel Croitoru MD Electronically signed by Jerel Balding MD Signature Date/Time: 10/20/2024/11:17:00 AM    Final    MR BRAIN WO CONTRAST Result Date: 10/19/2024 EXAM: MRI BRAIN WITHOUT CONTRAST 10/19/2024 05:42:17 PM TECHNIQUE: Multiplanar multisequence MRI of the head/brain was performed without the administration of intravenous contrast. COMPARISON: Compared to the CT from earlier the same day. CLINICAL HISTORY: Headache, neuro deficit. FINDINGS: BRAIN AND VENTRICLES: Small 1.2 cm focus of restricted diffusion involving the left basal ganglia, consistent with a small acute ischemic perforator-type infarct (series 2, series 31). Patchy T2 hyperintensity involving the periventricular and deep white matter of both hemispheres, as well as the pons, consistent with chronic small vessel ischemic disease, moderate in nature. Single chronic microhemorrhage noted at the posterior left temporal lobe, of doubtful significance in isolation. No mass. No midline shift. No hydrocephalus. The sella is unremarkable. Normal flow voids. ORBITS: Prior bilateral ocular lens replacement. No acute abnormality. SINUSES AND MASTOIDS: No acute abnormality. BONES AND SOFT TISSUES: Normal marrow signal. No acute soft tissue abnormality. IMPRESSION: 1. 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 2. Moderate chronic small vessel ischemic disease involving the periventricular and deep white matter of both hemispheres and the pons.  Electronically signed by: Morene Hoard MD 10/19/2024 06:49 PM EST RP Workstation: [id number]    Assessment/Plan: Diagnosis: L basal ganglia infarction Does the need for close, 24 hr/day medical supervision in concert with the patient's rehab needs make it unreasonable for this patient to be served in a less intensive setting? Yes Co-Morbidities requiring supervision/potential complications:  1) overweight: provide dietary education 2) HLD: continue Zetia  3) Dysphagia: continue SLP 4) History of vitamin D  insufficiency: continue vitamin D  supplement 5) Depression: continue Lexapro  Due to bladder management, bowel management, safety, skin/wound care, disease management, medication administration, pain management, and patient education, does the  patient require 24 hr/day rehab nursing? Yes Does the patient require coordinated care of a physician, rehab nurse, therapy disciplines of PT, OT, SLP to address physical and functional deficits in the context of the above medical diagnosis(es)? Yes Addressing deficits in the following areas: balance, endurance, locomotion, strength, transferring, bowel/bladder control, bathing, dressing, feeding, grooming, toileting, cognition, swallowing, and psychosocial support Can the patient actively participate in an intensive therapy program of at least 3 hrs of therapy per day at least 5 days per week? Yes The potential for patient to make measurable gains while on inpatient rehab is excellent Anticipated functional outcomes upon discharge from inpatient rehab are supervision  with PT, supervision with OT, supervision with SLP. Estimated rehab length of stay to reach the above functional goals is: 5-7 days Anticipated discharge destination: Home Overall Rehab/Functional Prognosis: excellent  POST ACUTE RECOMMENDATIONS: This patient's condition is appropriate for continued rehabilitative care in the following setting:  Patient has agreed to participate in  recommended program.   I have personally performed a face to face diagnostic evaluation of this patient. Additionally, I have examined the patient's medical record including any pertinent labs and radiographic images.    Thanks,  Sven SHAUNNA Elks, MD 10/21/2024     [1]  Allergies Allergen Reactions   Codeine Nausea And Vomiting    NAUSEA & VOMITING   Hydrocodone Nausea And Vomiting   Plaquenil  [Hydroxychloroquine ]     Vision changes per patient

## 2024-10-21 NOTE — Plan of Care (Signed)
°  Problem: Education: Goal: Knowledge of disease or condition will improve 10/21/2024 1710 by Doretta Harlene SAUNDERS, RN Outcome: Progressing 10/21/2024 1702 by Doretta Harlene SAUNDERS, RN Outcome: Progressing Goal: Knowledge of secondary prevention will improve (MUST DOCUMENT ALL) 10/21/2024 1710 by Doretta Harlene SAUNDERS, RN Outcome: Progressing 10/21/2024 1702 by Doretta Harlene SAUNDERS, RN Outcome: Progressing Goal: Knowledge of patient specific risk factors will improve (DELETE if not current risk factor) 10/21/2024 1710 by Doretta Harlene SAUNDERS, RN Outcome: Progressing 10/21/2024 1702 by Doretta Harlene SAUNDERS, RN Outcome: Progressing   Problem: Ischemic Stroke/TIA Tissue Perfusion: Goal: Complications of ischemic stroke/TIA will be minimized 10/21/2024 1710 by Doretta Harlene SAUNDERS, RN Outcome: Progressing 10/21/2024 1702 by Doretta Harlene SAUNDERS, RN Outcome: Progressing   Problem: Coping: Goal: Will verbalize positive feelings about self 10/21/2024 1710 by Doretta Harlene SAUNDERS, RN Outcome: Progressing 10/21/2024 1702 by Doretta Harlene SAUNDERS, RN Outcome: Progressing Goal: Will identify appropriate support needs 10/21/2024 1710 by Doretta Harlene SAUNDERS, RN Outcome: Progressing 10/21/2024 1702 by Doretta Harlene SAUNDERS, RN Outcome: Progressing

## 2024-10-21 NOTE — ED Notes (Signed)
 NT placed pt at sink to brush her teeth.  RN assisted pt with wheelchair to the restroom.   Pt eating breakfast at the bedside.  Daughter at bedside

## 2024-10-21 NOTE — Progress Notes (Signed)
 PROGRESS NOTE    Paula Murphy  FMW:[id number] DOB: August 24, 1946 DOA: 10/19/2024 PCP: Nichole Senior, MD   Brief Narrative: Paula Murphy is a 78 y.o. female with a history of osteoarthritis, fibromyalgia, hyperlipidemia, hypothyroidism, degenerative disc disease, depression, nephrolithiasis.  Patient presented secondary to right-sided weakness with initial concern for TIA versus stroke.  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  Patient started on dual antiplatelet therapy with aspirin  and Plavix .  Plan for discharge to acute inpatient rehab.   Assessment and Plan:  Acute stroke Patient presented with symptoms including right-sided weakness concerning for stroke/TIA.  CT of the head and neck obtained and was significant for no acute intracranial abnormality and no large vessel occlusion or hemodynamically significant stenosis at the head or neck.  MRI brain (12/15) confirms a small acute ischemic nonhemorrhagic infarct in the left basal ganglia. LDL of 85. Hemoglobin A1C of 5.3%. Transthoracic Echocardiogram significant for no atrial level shunt. Neurology recommendations for Plavix  for 3 weeks in addition to indefinite aspirin . PT/OT recommendations for CIR. - Continue Plavix , aspirin  and Zetia   Fibromyalgia Prior to arrival medication(s) include Robaxin , ibuprofen , tramadol  -Continue tramadol  as needed  Hypothyroidism Prior to arrival medication(s) include levothyroxine . - Continue levothyroxine   GERD Prior to arrival medication(s) include Protonix . - Continue Protonix   Hyperlipidemia Prior to arrival medication(s) include Zetia .  Patient has an intolerance to statin therapy.  Neurology recommendation for continuing Zetia  in addition to considering Repatha as an outpatient  Depression Anxiety Prior to arrival medication(s) include alprazolam  and Lexapro .  Lexapro  restarted on admission. - Continue Lexapro     DVT prophylaxis: Lovenox  Code Status:   Code Status: Full  Code Family Communication: Daughter at bedside Disposition Plan: Plan to discharge to acute inpatient rehab pending bed/insurance authorization   Consultants:  Neurology  Procedures:  Transthoracic echocardiogram  Antimicrobials: None   Subjective: Patient reports no specific issues this morning except for an episode of emesis after eating breakfast.  No recurrent nausea.  Objective: BP (!) 147/76 (BP Location: Right Arm)   Pulse 84   Temp 98.4 F (36.9 C) (Oral)   Resp 20   Ht 5' (1.524 m)   Wt 66.2 kg   SpO2 99%   BMI 28.51 kg/m   Examination:  General exam: Appears calm and comfortable. Respiratory system: Clear to auscultation. Respiratory effort normal. Cardiovascular system: S1 & S2 heard, RRR.  Gastrointestinal system: Abdomen is nondistended, soft and nontender. Normal bowel sounds heard. Central nervous system: Alert and oriented. 4/5 RUE strength compared to 5/5. 4/5 BLE strength Musculoskeletal: No edema. No calf tenderness Skin: No cyanosis. No rashes Psychiatry: Judgement and insight appear normal. Mood & affect appropriate.    Data Reviewed: I have personally reviewed following labs and imaging studies  CBC Lab Results  Component Value Date   WBC 6.2 10/20/2024   RBC 3.81 (L) 10/20/2024   HGB 12.1 10/20/2024   HCT 37.1 10/20/2024   MCV 97.4 10/20/2024   MCH 31.8 10/20/2024   PLT 188 10/20/2024   MCHC 32.6 10/20/2024   RDW 14.1 10/20/2024   LYMPHSABS 2.0 10/19/2024   MONOABS 0.4 10/19/2024   EOSABS 0.2 10/19/2024   BASOSABS 0.0 10/19/2024     Last metabolic panel Lab Results  Component Value Date   NA 140 10/20/2024   K 3.6 10/20/2024   CL 113 (H) 10/20/2024   CO2 20 (L) 10/20/2024   BUN 13 10/20/2024   CREATININE 0.68 10/20/2024   GLUCOSE 101 (H) 10/20/2024   GFRNONAA >  60 10/20/2024   GFRAA 85 11/30/2020   CALCIUM 8.0 (L) 10/20/2024   PROT 6.7 10/19/2024   ALBUMIN 3.8 10/19/2024   BILITOT 0.3 10/19/2024   ALKPHOS 56  10/19/2024   AST 18 10/19/2024   ALT 10 10/19/2024   ANIONGAP 7 10/20/2024    GFR: Estimated Creatinine Clearance: 50 mL/min (by C-G formula based on SCr of 0.68 mg/dL).  No results found for this or any previous visit (from the past 240 hours).    Radiology Studies: ECHOCARDIOGRAM COMPLETE Result Date: 10/20/2024    ECHOCARDIOGRAM REPORT   Patient Name:   Paula Murphy Date of Exam: 10/20/2024 Medical Rec #:  [id number]       Height:       60.0 in Accession #:    [id number]      Weight:       146.0 lb Date of Birth:  [birth year]       BSA:          1.633 m Patient Age:    78 years        BP:           115/70 mmHg Patient Gender: F               HR:           69 bpm. Exam Location:  Inpatient Procedure: 2D Echo, Cardiac Doppler and Color Doppler (Both Spectral and Color            Flow Doppler were utilized during procedure). Indications:    Stroke 163.9  History:        Patient has no prior history of Echocardiogram examinations.                 Risk Factors:Dyslipidemia.  Sonographer:    Merlynn Argyle Referring Phys: [id number] PROSPER M AMPONSAH  Sonographer Comments: Image acquisition challenging due to respiratory motion. IMPRESSIONS  1. Left ventricular ejection fraction, by estimation, is 60 to 65%. The left ventricle has normal function. The left ventricle has no regional wall motion abnormalities. Left ventricular diastolic parameters are consistent with Grade I diastolic dysfunction (impaired relaxation).  2. Right ventricular systolic function is normal. The right ventricular size is normal. There is normal pulmonary artery systolic pressure. The estimated right ventricular systolic pressure is 23.4 mmHg.  3. The mitral valve is normal in structure. No evidence of mitral valve regurgitation. No evidence of mitral stenosis.  4. The aortic valve is tricuspid. Aortic valve regurgitation is not visualized. No aortic stenosis is present.  5. The inferior vena cava is normal in size with greater than  50% respiratory variability, suggesting right atrial pressure of 3 mmHg. FINDINGS  Left Ventricle: Left ventricular ejection fraction, by estimation, is 60 to 65%. The left ventricle has normal function. The left ventricle has no regional wall motion abnormalities. The left ventricular internal cavity size was normal in size. There is  no left ventricular hypertrophy. Left ventricular diastolic parameters are consistent with Grade I diastolic dysfunction (impaired relaxation). Normal left ventricular filling pressure. Right Ventricle: The right ventricular size is normal. No increase in right ventricular wall thickness. Right ventricular systolic function is normal. There is normal pulmonary artery systolic pressure. The tricuspid regurgitant velocity is 2.26 m/s, and  with an assumed right atrial pressure of 3 mmHg, the estimated right ventricular systolic pressure is 23.4 mmHg. Left Atrium: Left atrial size was normal in size. Right Atrium: Right atrial size was normal in size. Pericardium: There is  no evidence of pericardial effusion. Presence of epicardial fat layer. Mitral Valve: The mitral valve is normal in structure. No evidence of mitral valve regurgitation. No evidence of mitral valve stenosis. Tricuspid Valve: The tricuspid valve is normal in structure. Tricuspid valve regurgitation is not demonstrated. No evidence of tricuspid stenosis. Aortic Valve: The aortic valve is tricuspid. Aortic valve regurgitation is not visualized. No aortic stenosis is present. Pulmonic Valve: The pulmonic valve was not well visualized. Pulmonic valve regurgitation is not visualized. No evidence of pulmonic stenosis. Aorta: The aortic root and ascending aorta are structurally normal, with no evidence of dilitation. Venous: The inferior vena cava is normal in size with greater than 50% respiratory variability, suggesting right atrial pressure of 3 mmHg. IAS/Shunts: No atrial level shunt detected by color flow Doppler.  LEFT  VENTRICLE PLAX 2D LVIDd:         3.50 cm   Diastology LVIDs:         2.10 cm   LV e' medial:    11.00 cm/s LV PW:         1.20 cm   LV E/e' medial:  5.5 LV IVS:        1.20 cm   LV e' lateral:   8.08 cm/s LVOT diam:     1.90 cm   LV E/e' lateral: 7.5 LV SV:         54 LV SV Index:   33 LVOT Area:     2.84 cm LV IVRT:       77 msec  RIGHT VENTRICLE             IVC RV Basal diam:  3.60 cm     IVC diam: 1.60 cm RV S prime:     14.90 cm/s TAPSE (M-mode): 1.7 cm LEFT ATRIUM             Index        RIGHT ATRIUM           Index LA diam:        2.40 cm 1.47 cm/m   RA Area:     11.10 cm LA Vol (A2C):   25.7 ml 15.74 ml/m  RA Volume:   25.30 ml  15.49 ml/m LA Vol (A4C):   25.2 ml 15.43 ml/m LA Biplane Vol: 25.7 ml 15.74 ml/m  AORTIC VALVE             PULMONIC VALVE LVOT Vmax:   89.80 cm/s  RVOT Peak grad: 3 mmHg LVOT Vmean:  66.500 cm/s LVOT VTI:    0.189 m  AORTA Ao Root diam: 3.50 cm Ao Asc diam:  3.70 cm MITRAL VALVE                TRICUSPID VALVE MV Area (PHT): 5.54 cm     TR Peak grad:   20.4 mmHg MV Decel Time: 137 msec     TR Vmax:        226.00 cm/s MV E velocity: 60.20 cm/s MV A velocity: 103.00 cm/s  SHUNTS MV E/A ratio:  0.58         Systemic VTI:  0.19 m                             Systemic Diam: 1.90 cm  Pulmonic VTI:  0.123 m Jerel Balding MD Electronically signed by Jerel Balding MD Signature Date/Time: 10/20/2024/11:17:00 AM    Final    MR BRAIN WO CONTRAST Result Date: 10/19/2024 EXAM: MRI BRAIN WITHOUT CONTRAST 10/19/2024 05:42:17 PM TECHNIQUE: Multiplanar multisequence MRI of the head/brain was performed without the administration of intravenous contrast. COMPARISON: Compared to the CT from earlier the same day. CLINICAL HISTORY: Headache, neuro deficit. FINDINGS: BRAIN AND VENTRICLES: Small 1.2 cm focus of restricted diffusion involving the left basal ganglia, consistent with a small acute ischemic perforator-type infarct (series 2, series 31). Patchy T2  hyperintensity involving the periventricular and deep white matter of both hemispheres, as well as the pons, consistent with chronic small vessel ischemic disease, moderate in nature. Single chronic microhemorrhage noted at the posterior left temporal lobe, of doubtful significance in isolation. No mass. No midline shift. No hydrocephalus. The sella is unremarkable. Normal flow voids. ORBITS: Prior bilateral ocular lens replacement. No acute abnormality. SINUSES AND MASTOIDS: No acute abnormality. BONES AND SOFT TISSUES: Normal marrow signal. No acute soft tissue abnormality. IMPRESSION: 1. 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 2. Moderate chronic small vessel ischemic disease involving the periventricular and deep white matter of both hemispheres and the pons. Electronically signed by: Morene Hoard MD 10/19/2024 06:49 PM EST RP Workstation: [id number]   CT ANGIO HEAD NECK W WO CM Result Date: 10/19/2024 EXAM: CT HEAD WITHOUT CTA HEAD AND NECK WITHOUT AND WITH 10/19/2024 02:42:52 PM TECHNIQUE: CTA of the head and neck was performed without and with the administration of 75 mL of intravenous iohexol  (OMNIPAQUE ) 350 MG/ML injection. Noncontrast CT of the head with reconstructed 2-D images are also provided for review. Multiplanar 2D and/or 3D reformatted images are provided for review. Automated exposure control, iterative reconstruction, and/or weight based adjustment of the mA/kV was utilized to reduce the radiation dose to as low as reasonably achievable. COMPARISON: CTA head/neck 08/23/2024 CLINICAL HISTORY: Neuro deficit, acute, stroke suspected. FINDINGS: CT HEAD: BRAIN AND VENTRICLES: No acute intracranial hemorrhage. No mass effect. No midline shift. No extra-axial fluid collection. No evidence of acute infarct. No hydrocephalus. Stable background of moderate chronic small vessel disease and central pattern of volume loss. ORBITS: No acute  abnormality. SINUSES AND MASTOIDS: Chronic left sphenoid sinusitis. CTA NECK: AORTIC ARCH AND ARCH VESSELS: No dissection or arterial injury. No significant stenosis of the brachiocephalic or subclavian arteries. CERVICAL CAROTID ARTERIES: No dissection, arterial injury, or hemodynamically significant stenosis by NASCET criteria. CERVICAL VERTEBRAL ARTERIES: No dissection, arterial injury, or significant stenosis. LUNGS AND MEDIASTINUM: Unremarkable. SOFT TISSUES: No acute abnormality. BONES: Prior C5 to C7 ACDF. Hardware is intact. No associated lucency or fracture. No suspicious bone lesion. CTA HEAD: ANTERIOR CIRCULATION: No significant stenosis of the internal carotid arteries. No significant stenosis of the anterior cerebral arteries. No significant stenosis of the middle cerebral arteries. Conical outpouching from the communicating segment of the left ICA, favored to represent a posterior communicating artery infundibulum. POSTERIOR CIRCULATION: No significant stenosis of the posterior cerebral arteries. No significant stenosis of the basilar artery. No significant stenosis of the vertebral arteries. No aneurysm. OTHER: No dural venous sinus thrombosis on this non-dedicated study. IMPRESSION: 1. No acute intracranial abnormality. 2. No large vessel occlusion or hemodynamically significant stenosis in the head or neck. Electronically signed by: Ryan Chess MD 10/19/2024 02:54 PM EST RP Workstation: [id number]      LOS: 0 days    Elgin Lam, MD [facility] 10/21/2024, 2:40 PM  If 7PM-7AM, please contact night-coverage [email or web address]

## 2024-10-21 NOTE — Progress Notes (Signed)
 Physical Therapy Treatment Patient Details Name: Paula Murphy MRN: [id number] DOB: 07/28/1946 Today's Date: 10/21/2024   History of Present Illness 78 yo F adm 10/19/24 with R facial droop and R side weakness. MRI(+) L basal ganglia CVA. PMH: HLD, macular degeneration, nephrolithiasis, hypothyroidism, HTN, fibromyalgia, LTKA.    PT Comments  Pt greeted supine in bed, pleasant and agreeable to PT session. She advanced gait ambulating within the hallway using RW with CGA. Pt required intermittent standing rest breaks and demonstrated multiple gait abnormalities. As pt fatigued, she developed a shuffling gait pattern and increased forward lean. Multi-modal cues for improved technique and safety awareness. Pt complete sit<>stand from lowest bed height x5 reps using RW with CGA. Will continue to follow acutely and advance appropriately.      If plan is discharge home, recommend the following: A little help with walking and/or transfers;A little help with bathing/dressing/bathroom;Assistance with cooking/housework;Assist for transportation   Can travel by private vehicle        Equipment Recommendations  None recommended by PT (Pt already has needed DME)    Recommendations for Other Services       Precautions / Restrictions Precautions Precautions: Fall Recall of Precautions/Restrictions: Intact Restrictions Weight Bearing Restrictions Per Provider Order: No     Mobility  Bed Mobility Overal bed mobility: Needs Assistance Bed Mobility: Supine to Sit, Sit to Supine     Supine to sit: Supervision, HOB elevated Sit to supine: Supervision, HOB elevated   General bed mobility comments: Pt sat up on L side of bed with increased time. She brought BLE off EOB and slowly elevated trunk. Returning to bed pt swung BLE back in and repositioned herself by scooting upwards pushing with BLE.    Transfers Overall transfer level: Needs assistance Equipment used: Rolling walker (2  wheels) Transfers: Sit to/from Stand, Bed to chair/wheelchair/BSC Sit to Stand: Contact guard assist, Supervision   Step pivot transfers: Contact guard assist       General transfer comment: Pt stood from lowest bed height. Cued proper hand placement using RW. Powered up with CGA. Transferred to/from the bathroom. Cues for proximity to RW and navigating the tight space. Pt completed sit<>stand x5 reps with CGA for safety. Good eccentric control.    Ambulation/Gait Ambulation/Gait assistance: Contact guard assist Gait Distance (Feet): 100 Feet (with intermittent standing rest break) Assistive device: Rolling walker (2 wheels) Gait Pattern/deviations: Step-to pattern, Decreased step length - right, Decreased step length - left, Decreased stride length, Decreased dorsiflexion - left, Shuffle Gait velocity: decreased Gait velocity interpretation: <1.31 ft/sec, indicative of household ambulator   General Gait Details: Pt ambulated with short slow steps. She maintained upright posture, but required cues for proximity to RW. Pt lacked left ankle DF achieving minimal foot clearance. As she fatigued, her steps became significantly shorter and she began to shuffle. No lean, but some unsteadiness noted by postural sway. Cues for safety throughout mobility. Pt took standing rest breaks to recover.   Stairs             Wheelchair Mobility     Tilt Bed    Modified Rankin (Stroke Patients Only) Modified Rankin (Stroke Patients Only) Pre-Morbid Rankin Score: No significant disability Modified Rankin: Moderately severe disability     Balance Overall balance assessment: Needs assistance Sitting-balance support: No upper extremity supported, Feet supported Sitting balance-Leahy Scale: Fair     Standing balance support: No upper extremity supported, During functional activity Standing balance-Leahy Scale: Fair  Communication  Communication Communication: No apparent difficulties  Cognition Arousal: Alert Behavior During Therapy: WFL for tasks assessed/performed   PT - Cognitive impairments: No apparent impairments                       PT - Cognition Comments: Pt A,Ox4. She appears to have delayed processing. Following commands: Intact      Cueing Cueing Techniques: Verbal cues  Exercises      General Comments General comments (skin integrity, edema, etc.): VSS on RA. Husband present and supportive throughout session. Educated pt on BEFAST. She verbalized understanding.      Pertinent Vitals/Pain Pain Assessment Pain Assessment: No/denies pain    Home Living                          Prior Function            PT Goals (current goals can now be found in the care plan section) Acute Rehab PT Goals Patient Stated Goal: Regain strength and move better PT Goal Formulation: With patient Time For Goal Achievement: 11/03/24 Potential to Achieve Goals: Good Progress towards PT goals: Progressing toward goals    Frequency    Min 3X/week      PT Plan      Co-evaluation              AM-PAC PT 6 Clicks Mobility   Outcome Measure  Help needed turning from your back to your side while in a flat bed without using bedrails?: None Help needed moving from lying on your back to sitting on the side of a flat bed without using bedrails?: A Little Help needed moving to and from a bed to a chair (including a wheelchair)?: A Little Help needed standing up from a chair using your arms (e.g., wheelchair or bedside chair)?: A Little Help needed to walk in hospital room?: A Little Help needed climbing 3-5 steps with a railing? : A Lot 6 Click Score: 18    End of Session Equipment Utilized During Treatment: Gait belt Activity Tolerance: Patient tolerated treatment well Patient left: in bed;with call bell/phone within reach;with bed alarm set;with family/visitor present Nurse  Communication: Mobility status PT Visit Diagnosis: Unsteadiness on feet (R26.81);Difficulty in walking, not elsewhere classified (R26.2);Pain     Time: 8649-8577 PT Time Calculation (min) (ACUTE ONLY): 32 min  Charges:    $Gait Training: 8-22 mins $Therapeutic Activity: 8-22 mins PT General Charges $$ ACUTE PT VISIT: 1 Visit                     Randall SAUNDERS, PT, DPT Acute Rehabilitation Services Office: [phone] Secure Chat Preferred  Delon CHRISTELLA Callander 10/21/2024, 2:50 PM

## 2024-10-22 ENCOUNTER — Other Ambulatory Visit (HOSPITAL_COMMUNITY): Payer: Self-pay

## 2024-10-22 DIAGNOSIS — E039 Hypothyroidism, unspecified: Secondary | ICD-10-CM | POA: Diagnosis not present

## 2024-10-22 DIAGNOSIS — I639 Cerebral infarction, unspecified: Secondary | ICD-10-CM | POA: Diagnosis not present

## 2024-10-22 LAB — BASIC METABOLIC PANEL WITH GFR
Anion gap: 6 (ref 5–15)
BUN: 9 mg/dL (ref 8–23)
CO2: 23 mmol/L (ref 22–32)
Calcium: 8.2 mg/dL — ABNORMAL LOW (ref 8.9–10.3)
Chloride: 111 mmol/L (ref 98–111)
Creatinine, Ser: 0.59 mg/dL (ref 0.44–1.00)
GFR, Estimated: >60 mL/min (ref >60)
Glucose, Bld: 91 mg/dL (ref 70–99)
Potassium: 3.7 mmol/L (ref 3.5–5.1)
Sodium: 140 mmol/L (ref 135–145)

## 2024-10-22 NOTE — Progress Notes (Signed)
 Physical Therapy Treatment Patient Details Name: Paula Murphy MRN: [id number] DOB: 18-Apr-1946 Today's Date: 10/22/2024   History of Present Illness 78 yo F adm 10/19/24 with R facial droop and R side weakness. MRI(+) L basal ganglia CVA. NIH 0 PMH: HLD, macular degeneration, nephrolithiasis, hypothyroidism, HTN, fibromyalgia, LTKA    PT Comments  Today's session focused on the pt's ability to maintain walking balance while responding to different task demands, through various dynamic conditions. She completed the DGI and scored 11/24, which is below the cut-off for increased fall risk (<19/24). Introduced occupational hygienist and educated pt on proper and safe use of AD. Practiced a different AD to aid in pt's energy conservation allowing her to have a built in seated rest break. Pt required minA to safely mobilize and maintain proper proximity to rollator. Instructed pt to slightly break to aid in keeping AD closer to her. She took 3 seated rest breaks during ambulation. As pt fatigued, she developed a shuffling gait pattern. She reported an 8/10 on the modified RPE scale at the end of the session. Pt is highly motivated to regain her PLOF and is eager to engage in therapy. Pt will benefit from intensive inpatient follow-up therapy, >3 hours/day.   If plan is discharge home, recommend the following: A little help with walking and/or transfers;A little help with bathing/dressing/bathroom;Assistance with cooking/housework;Assist for transportation   Can travel by private vehicle        Equipment Recommendations  None recommended by PT (Pt already has needed DME)    Recommendations for Other Services       Precautions / Restrictions Precautions Precautions: Fall Recall of Precautions/Restrictions: Intact Restrictions Weight Bearing Restrictions Per Provider Order: No     Mobility  Bed Mobility Overal bed mobility: Needs Assistance Bed Mobility: Supine to Sit, Sit to Supine     Supine to sit:  Contact guard Sit to supine: Min assist   General bed mobility comments: Pt sat up on L side of bed with increased time. She brought BLE off EOB slowly. Pt pulled on PT to scoot hips fwd. Returning to bed pt required assist bring BLE back in secondary to fatigue. Repositioned using bed pad, bed features, and +2 assist.    Transfers Overall transfer level: Needs assistance Equipment used: Rolling walker (2 wheels) Transfers: Sit to/from Stand Sit to Stand: Contact guard assist           General transfer comment: Introduced rollator and educated pt on proper and safe use of AD. Instructed pt to lock/unlock breaks. Cued proper hand placement. She powered up with CGA. Pt took 3 seated rest breaks on rollator seat. She locked/unlocked breaks with cue and demonstrated good eccentric control with sitting down.    Ambulation/Gait Ambulation/Gait assistance: Contact guard assist, Min assist Gait Distance (Feet): 60 Feet (x3, seated rest breaks) Assistive device: Rollator (4 wheels) Gait Pattern/deviations: Step-to pattern, Decreased step length - right, Decreased step length - left, Decreased stride length, Decreased dorsiflexion - left, Shuffle Gait velocity: decreased Gait velocity interpretation: <1.31 ft/sec, indicative of household ambulator   General Gait Details: Pt too short slow steps with minimal to no foot clearence bilat. She demonstrated a fwd lean and would keep AD too far from her. Cues for increased step length, wider BOS, increased step height, and closer proximity to RW. MinA placed on AD to ensure pt was properly support and stable while mobilizing. As she fatigued, pt took began to take small shuffling steps. Distance limited d/t fatigue requiring  seated rest breaks.   Stairs Stairs: Yes Stairs assistance: Contact guard assist Stair Management: Two rails, Forwards, Step to pattern Number of Stairs: 3 General stair comments: Pt ascended/descended 3 small ~4 steps one at  a time leading with RLE and maintained BUE support on handrails.   Wheelchair Mobility     Tilt Bed    Modified Rankin (Stroke Patients Only) Modified Rankin (Stroke Patients Only) Pre-Morbid Rankin Score: No significant disability Modified Rankin: Moderately severe disability     Balance                                 Standardized Balance Assessment Standardized Balance Assessment : Dynamic Gait Index   Dynamic Gait Index Level Surface: Moderate Impairment Change in Gait Speed: Moderate Impairment Gait with Horizontal Head Turns: Mild Impairment Gait with Vertical Head Turns: Mild Impairment Gait and Pivot Turn: Moderate Impairment Step Over Obstacle: Moderate Impairment Step Around Obstacles: Mild Impairment Steps: Moderate Impairment Total Score: 11      Communication Communication Communication: No apparent difficulties  Cognition Arousal: Alert Behavior During Therapy: WFL for tasks assessed/performed   PT - Cognitive impairments: No apparent impairments                         Following commands: Intact      Cueing Cueing Techniques: Verbal cues  Exercises      General Comments General comments (skin integrity, edema, etc.): Husband present and supportive throughout session. Pt reported an 8/10 on the modified RPE scale at end of session.      Pertinent Vitals/Pain Pain Assessment Pain Assessment: No/denies pain    Home Living                          Prior Function            PT Goals (current goals can now be found in the care plan section) Acute Rehab PT Goals Patient Stated Goal: Regain indepence to safely return home after IPR PT Goal Formulation: With patient Time For Goal Achievement: 11/03/24 Potential to Achieve Goals: Good Progress towards PT goals: Progressing toward goals    Frequency    Min 3X/week      PT Plan      Co-evaluation              AM-PAC PT 6 Clicks Mobility    Outcome Measure  Help needed turning from your back to your side while in a flat bed without using bedrails?: A Little Help needed moving from lying on your back to sitting on the side of a flat bed without using bedrails?: A Little Help needed moving to and from a bed to a chair (including a wheelchair)?: A Little Help needed standing up from a chair using your arms (e.g., wheelchair or bedside chair)?: A Little Help needed to walk in hospital room?: A Little Help needed climbing 3-5 steps with a railing? : A Little 6 Click Score: 18    End of Session Equipment Utilized During Treatment: Gait belt Activity Tolerance: Patient tolerated treatment well;Patient limited by fatigue Patient left: in bed;with call bell/phone within reach;with bed alarm set;with family/visitor present Nurse Communication: Mobility status PT Visit Diagnosis: Unsteadiness on feet (R26.81);Difficulty in walking, not elsewhere classified (R26.2);Pain     Time: 1634-1700 PT Time Calculation (min) (ACUTE ONLY): 26 min  Charges:    $  Gait Training: 8-22 mins $Neuromuscular Re-education: 8-22 mins PT General Charges $$ ACUTE PT VISIT: 1 Visit                     Randall SAUNDERS, PT, DPT Acute Rehabilitation Services Office: [phone] Secure Chat Preferred  Delon CHRISTELLA Callander 10/22/2024, 5:26 PM

## 2024-10-22 NOTE — Plan of Care (Signed)
   Problem: Coping: Goal: Will verbalize positive feelings about self Outcome: Progressing

## 2024-10-22 NOTE — Progress Notes (Signed)
° °  Inpatient Rehabilitation Admissions Coordinator   I met with patient and friend, Arley, at bedside. Reviewed estimated cost of care for CIR if approved. I let her know that as well as she is mobilizing, payor may not approve admit.   Heron Leavell, RN, MSN Rehab Admissions Coordinator [phone] 10/22/2024 12:26 PM

## 2024-10-22 NOTE — Progress Notes (Signed)
 PROGRESS NOTE    [name]  FMW:[id number] DOB: [birth year] DOA: 10/19/2024 PCP: Nichole Senior, MD   Brief Narrative: Paula Murphy is a 78 y.o. female with a history of osteoarthritis, fibromyalgia, hyperlipidemia, hypothyroidism, degenerative disc disease, depression, nephrolithiasis.  Patient presented secondary to right-sided weakness with initial concern for TIA versus stroke.  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  Patient started on dual antiplatelet therapy with aspirin  and Plavix .  Plan for discharge to acute inpatient rehab.   Assessment and Plan:  Acute stroke Patient presented with symptoms including right-sided weakness concerning for stroke/TIA.  CT of the head and neck obtained and was significant for no acute intracranial abnormality and no large vessel occlusion or hemodynamically significant stenosis at the head or neck.  MRI brain (12/15) confirms a small acute ischemic nonhemorrhagic infarct in the left basal ganglia. LDL of 85. Hemoglobin A1C of 5.3%. Transthoracic Echocardiogram significant for no atrial level shunt. Neurology recommendations for Plavix  for 3 weeks in addition to indefinite aspirin . PT/OT recommendations for CIR. - Continue Plavix , aspirin  and Zetia   Fibromyalgia Prior to arrival medication(s) include Robaxin , ibuprofen , tramadol  - Continue tramadol  as needed  Hypothyroidism Prior to arrival medication(s) include levothyroxine . - Continue levothyroxine   GERD Prior to arrival medication(s) include Protonix . - Continue Protonix   Hyperlipidemia Prior to arrival medication(s) include Zetia .  Patient has an intolerance to statin therapy.  Neurology recommendation for continuing Zetia  in addition to considering Repatha as an outpatient  Depression Anxiety Prior to arrival medication(s) include alprazolam  and Lexapro .  Lexapro  restarted on admission. - Continue Lexapro   Nausea/vomiting Patient with multiple episodes of emesis on  12/17, possibly related to food she ate. She reports symptoms have now resolved. - Continue Zofran  and Compazine  as needed    DVT prophylaxis: Lovenox  Code Status:   Code Status: Full Code Family Communication: None at bedside Disposition Plan: Plan to discharge to acute inpatient rehab pending bed/insurance authorization   Consultants:  Neurology  Procedures:  Transthoracic echocardiogram  Antimicrobials: None   Subjective: No nausea or vomiting today. Patient feels well today. Walked well yesterday with PT/OT  Objective: BP (!) 144/70 (BP Location: Right Arm)   Pulse 76   Temp 98.9 F (37.2 C) (Oral)   Resp 14   Ht 5' (1.524 m)   Wt 66.2 kg   SpO2 99%   BMI 28.51 kg/m   Examination:  General exam: Appears calm and comfortable. Respiratory system: Clear to auscultation. Respiratory effort normal. Cardiovascular system: S1 & S2 heard, RRR.  Gastrointestinal system: Abdomen is nondistended, soft and nontender. Normal bowel sounds heard. Central nervous system: Alert and oriented. Musculoskeletal: No edema. No calf tenderness Psychiatry: Judgement and insight appear normal. Mood & affect appropriate.    Data Reviewed: I have personally reviewed following labs and imaging studies  CBC Lab Results  Component Value Date   WBC 6.2 10/20/2024   RBC 3.81 (L) 10/20/2024   HGB 12.1 10/20/2024   HCT 37.1 10/20/2024   MCV 97.4 10/20/2024   MCH 31.8 10/20/2024   PLT 188 10/20/2024   MCHC 32.6 10/20/2024   RDW 14.1 10/20/2024   LYMPHSABS 2.0 10/19/2024   MONOABS 0.4 10/19/2024   EOSABS 0.2 10/19/2024   BASOSABS 0.0 10/19/2024     Last metabolic panel Lab Results  Component Value Date   NA 140 10/22/2024   K 3.7 10/22/2024   CL 111 10/22/2024   CO2 23 10/22/2024   BUN 9 10/22/2024   CREATININE 0.59 10/22/2024  GLUCOSE 91 10/22/2024   GFRNONAA >60 10/22/2024   GFRAA 85 11/30/2020   CALCIUM 8.2 (L) 10/22/2024   PROT 6.7 10/19/2024   ALBUMIN 3.8  10/19/2024   BILITOT 0.3 10/19/2024   ALKPHOS 56 10/19/2024   AST 18 10/19/2024   ALT 10 10/19/2024   ANIONGAP 6 10/22/2024    GFR: Estimated Creatinine Clearance: 50 mL/min (by C-G formula based on SCr of 0.59 mg/dL).  No results found for this or any previous visit (from the past 240 hours).    Radiology Studies: No results found.     LOS: 1 day    Elgin Lam, MD [facility] 10/22/2024, 12:53 PM   If 7PM-7AM, please contact night-coverage [email or web address]

## 2024-10-22 NOTE — PMR Pre-admission (Shared)
 PMR Admission Coordinator Pre-Admission Assessment  Patient: Paula Murphy is an 78 y.o., female MRN: [id number] DOB: [birth year] Height: 5' (152.4 cm) Weight: 66.2 kg  Insurance Information HMO: yes    PPO:      PCP:      IPA:      80/20:      OTHER:  PRIMARY: [employer] Medicare      Policy#: [id number]; Medicare [id number]      Subscriber: pt CM Name: ***      Phone#: [phone] option 3     Fax#: [phone] Pre-Cert#: [id number] Auth for CIR from *** with *** for admit *** with next review date ***.  Updates due to *** at fax listed above.        Employer:  Benefits:  Phone #: [phone]     Name: 12/18 Eff. Date: 11/06/23 states has [employer] Medicare 2026     Deduct: none      Out of Pocket Max: $3900      Life Max: none CIR: $295 co pay per day days 1 until 5      SNF: no co pay per day days 1 until 20; $$203 co pay per day days 21 until 100 Outpatient: $20 per visit     Co-Pay:  Home Health: 100%      Co-Pay: limited by medical neccesity DME: 80%     Co-Pay: 20% Providers: in network  SECONDARY: none      Policy#:      Phone#:   Artist:       Phone#:   The [employer] for patients in Inpatient Rehabilitation Facilities with attached Privacy Act Statement-Health Care Records was provided and verbally reviewed with: Patient  Emergency Contact Information Contact Information     Name Relation Home Work Mobile   [name] Daughter [phone]  [phone]      Other Contacts   None on File    Current Medical History  Patient Admitting Diagnosis: CVA  History of Present Illness: 78 yo female with past medical history of osteoarthritis, fibromyalgia, HLD, hypothyroidism, DDD, macular degeneration, nephrolithiasis nd depression presented on 10/19/24 to ED for right sided weakness. Initially seen at [facility] and sent to [facility] ER same day.  Mri revealed small left BG CVA. Neurology consulted. CTA head and neck without LVO or  significant stenosis. 2 D echo EF 60 to 65%. VTE prophylaxis lovenox . No antithrombotic pro to admit, now on ASA and clopidogrel  for 3 weeks and then ASA alone. H/o HTN but no longer hypertensive. No home meds. Home meds Zetia  resumed. History of myalgias with statins. LDL 85. Neurology recommends starting a PSK9 inhibitor, such as Repatha, in the OP setting for better cholesterol control and to decrease future risk.   Complete NIHSS TOTAL: 3  Patient's medical record from [facility] has been reviewed by the rehabilitation admission coordinator and physician.  Past Medical History  Past Medical History:  Diagnosis Date   Arthritis    OA AND PAIN RT KNEE   Autoimmune disease    + ANA, and DS DNA--PT STATES SHE WAS TOLD SHE DOES NOT HAVE LUPUS AS PREVIOUSLY THOUGHT   Fibromyalgia    followed by Dr. Dolphus   Hemorrhoids    Hyperlipidemia    Hypothyroidism    Kidney stone    Macular degeneration    BOTH EYES   PONV (postoperative nausea and vomiting)    Thyroid  disease    Hypothyroidism   Has the patient  had major surgery during 100 days prior to admission? No  Family History   family history includes Alzheimer's disease in her mother; Arthritis in her father; Breast cancer in her cousin and cousin; COPD in her brother, father, and mother; Cancer in her father and mother; Diabetes in her father; Hyperlipidemia in her father and mother; Hypertension in her father and mother; Other in her brother; Stroke in her father and mother; Thyroid  disease in her daughter.  Current Medications Current Medications[1]  Patients Current Diet:  Diet Order             Diet heart healthy/carb modified Room service appropriate? Yes; Fluid consistency: Thin  Diet effective now                  Precautions / Restrictions Precautions Precautions: Fall Restrictions Weight Bearing Restrictions Per Provider Order: No   Has the patient had 2 or more falls or a fall with injury in the  past year? No  Prior Activity Level Community (5-7x/wk): Indpendent, living alone, driving  Prior Functional Level Self Care: Did the patient need help bathing, dressing, using the toilet or eating? Independent  Indoor Mobility: Did the patient need assistance with walking from room to room (with or without device)? Independent  Stairs: Did the patient need assistance with internal or external stairs (with or without device)? Independent  Functional Cognition: Did the patient need help planning regular tasks such as shopping or remembering to take medications? Independent  Patient Information Are you of Hispanic, Latino/a,or Spanish origin?: A. No, not of Hispanic, Latino/a, or Spanish origin What is your race?: A. White Do you need or want an interpreter to communicate with a doctor or health care staff?: 0. No  Patient's Response To:  Health Literacy and Transportation Is the patient able to respond to health literacy and transportation needs?: Yes Health Literacy - How often do you need to have someone help you when you read instructions, pamphlets, or other written material from your doctor or pharmacy?: Never In the past 12 months, has lack of transportation kept you from medical appointments or from getting medications?: No In the past 12 months, has lack of transportation kept you from meetings, work, or from getting things needed for daily living?: No  Home Assistive Devices / Equipment Home Equipment: Agricultural Consultant (2 wheels), Rollator (4 wheels), Shower seat, Grab bars - tub/shower, Hand held shower head  Prior Device Use: Indicate devices/aids used by the patient prior to current illness, exacerbation or injury? None of the above  Current Functional Level Cognition  Orientation Level: Oriented X4    Extremity Assessment (includes Sensation/Coordination)  Upper Extremity Assessment: Generalized weakness, Right hand dominant  Lower Extremity Assessment: Defer to PT  evaluation RLE Deficits / Details: hip flex 3-/5, knee ext 3/5 RLE Sensation: decreased proprioception RLE Coordination: decreased gross motor LLE Deficits / Details: hip flex 3/5, knee ext 3+/5 LLE Sensation: decreased proprioception LLE Coordination: decreased gross motor    ADLs  Overall ADL's : Needs assistance/impaired Eating/Feeding: Independent Eating/Feeding Details (indicate cue type and reason): supported sitting Grooming: Set up Grooming Details (indicate cue type and reason): supported sitting Upper Body Bathing: Set up Upper Body Bathing Details (indicate cue type and reason): supported sitting Lower Body Bathing: Minimal assistance Lower Body Bathing Details (indicate cue type and reason): CGA sit<>stand Upper Body Dressing : Set up, Sitting Upper Body Dressing Details (indicate cue type and reason): supported sitting Lower Body Dressing: Minimal assistance Lower Body Dressing Details (  indicate cue type and reason): CGA sit<>stand Toilet Transfer: Contact guard assist, Ambulation, Regular Toilet Toilet Transfer Details (indicate cue type and reason): CGA to ambulate to regular toilet, declined need for AD. able to stand from toilet without assist Toileting- Clothing Manipulation and Hygiene: Supervision/safety, Sitting/lateral lean, Sit to/from stand Toileting - Clothing Manipulation Details (indicate cue type and reason): able to manage depends without assist Functional mobility during ADLs: Contact guard assist    Mobility  Overal bed mobility: Needs Assistance Bed Mobility: Supine to Sit, Sit to Supine Supine to sit: Contact guard Sit to supine: Min assist General bed mobility comments: Pt sat up on L side of bed with increased time. She brought BLE off EOB slowly. Pt pulled on PT to scoot hips fwd. Returning to bed pt required assist bring BLE back in secondary to fatigue. Repositioned using bed pad, bed features, and +2 assist.    Transfers  Overall transfer  level: Needs assistance Equipment used: Rolling walker (2 wheels) Transfers: Sit to/from Stand Sit to Stand: Contact guard assist Bed to/from chair/wheelchair/BSC transfer type:: Step pivot Step pivot transfers: Contact guard assist General transfer comment: Introduced rollator and educated pt on proper and safe use of AD. Instructed pt to lock/unlock breaks. Cued proper hand placement. She powered up with CGA. Pt took 3 seated rest breaks on rollator seat. She locked/unlocked breaks with cue and demonstrated good eccentric control with sitting down.    Ambulation / Gait / Stairs / Wheelchair Mobility  Ambulation/Gait Ambulation/Gait assistance: Contact guard assist, Min assist Gait Distance (Feet): 60 Feet (x3, seated rest breaks) Assistive device: Rollator (4 wheels) Gait Pattern/deviations: Step-to pattern, Decreased step length - right, Decreased step length - left, Decreased stride length, Decreased dorsiflexion - left, Shuffle General Gait Details: Pt too short slow steps with minimal to no foot clearence bilat. She demonstrated a fwd lean and would keep AD too far from her. Cues for increased step length, wider BOS, increased step height, and closer proximity to RW. MinA placed on AD to ensure pt was properly support and stable while mobilizing. As she fatigued, pt took began to take small shuffling steps. Distance limited d/t fatigue requiring seated rest breaks. Gait velocity: decreased Gait velocity interpretation: <1.31 ft/sec, indicative of household ambulator Stairs: Yes Stairs assistance: Contact guard assist Stair Management: Two rails, Forwards, Step to pattern Number of Stairs: 3 General stair comments: Pt ascended/descended 3 small ~4 steps one at a time leading with RLE and maintained BUE support on handrails.    Posture / Balance Dynamic Sitting Balance Sitting balance - Comments: left lateral lean with activity--reported on one occassion she knew she was leaning--did not  self correct until task was complete Balance Overall balance assessment: Needs assistance Sitting-balance support: No upper extremity supported, Feet supported Sitting balance-Leahy Scale: Fair Sitting balance - Comments: left lateral lean with activity--reported on one occassion she knew she was leaning--did not self correct until task was complete Standing balance support: No upper extremity supported, During functional activity Standing balance-Leahy Scale: Fair Standing balance comment: pt unable to accept challenge or correct for LOB in timeframe sufficient to prevent falling Standardized Balance Assessment Standardized Balance Assessment : Dynamic Gait Index Dynamic Gait Index Level Surface: Moderate Impairment Change in Gait Speed: Moderate Impairment Gait with Horizontal Head Turns: Mild Impairment Gait with Vertical Head Turns: Mild Impairment Gait and Pivot Turn: Moderate Impairment Step Over Obstacle: Moderate Impairment Step Around Obstacles: Mild Impairment Steps: Moderate Impairment Total Score: 11    Special  considerations/life events     Previous Home Environment  Living Arrangements: Alone  Lives With: Alone Available Help at Discharge: Family, Friend(s) (states dtr, and friends initially could give close to 24/7) Type of Home: House Home Layout: One level Home Access: Level entry Bathroom Shower/Tub: Tub/shower unit, Engineer, Building Services: Standard Bathroom Accessibility: Yes How Accessible: Accessible via walker Home Care Services: No Additional Comments: LLE weakness ever since her 2nd COVID shot in 2021. Family lives nearby, very supportive  Discharge Living Setting Plans for Discharge Living Setting: Patient's home, Alone, House Type of Home at Discharge: House Discharge Home Layout: One level Discharge Home Access: Level entry Discharge Bathroom Shower/Tub: Tub/shower unit, Curtain Discharge Bathroom Toilet: Standard Discharge Bathroom  Accessibility: Yes How Accessible: Accessible via walker Does the patient have any problems obtaining your medications?: No  Social/Family/Support Systems Patient Roles: Parent Contact Information: Katrina, daughter Anticipated Caregiver: daughter at [facility], friends during the day ETTER Kuba) Anticipated Caregiver's Contact Information: see contacts Ability/Limitations of Caregiver: daughter works days Caregiver Availability: 24/7 Discharge Plan Discussed with Primary Caregiver: Yes Is Caregiver In Agreement with Plan?: Yes Does Caregiver/Family have Issues with Lodging/Transportation while Pt is in Rehab?: No  Goals Patient/Family Goal for Rehab: Mod I to intermittent supervision with PT and OT Expected length of stay: ELOS 4 to 6 days Pt/Family Agrees to Admission and willing to participate: Yes Program Orientation Provided & Reviewed with Pt/Caregiver Including Roles  & Responsibilities: Yes  Decrease burden of Care through IP rehab admission: n/a  Possible need for SNF placement upon discharge: not anticipated  Patient Condition: This patient's condition remains as documented in the consult dated ***, in which the Rehabilitation Physician determined and documented that the patient's condition is appropriate for intensive rehabilitative care in an inpatient rehabilitation facility. Will admit to inpatient rehab today.  Preadmission Screen Completed By:  Alison Heron Lot, RN MSN 10/22/2024 5:39 PM ______________________________________________________________________   Discussed status with Dr. PIERRETTE on *** at *** and received approval for admission today.  Admission Coordinator:  Alison Heron Lot, RN MSN time ***Date ***   Assessment/Plan: Diagnosis: *** Does the need for close, 24 hr/day Medical supervision in concert with the patient's rehab needs make it unreasonable for this patient to be served in a less intensive setting? {yes_no_potentially:3041433} Co-Morbidities  requiring supervision/potential complications: *** Due to {due un:6958565}, does the patient require 24 hr/day rehab nursing? {yes_no_potentially:3041433} Does the patient require coordinated care of a physician, rehab nurse, PT, OT, and SLP to address physical and functional deficits in the context of the above medical diagnosis(es)? {yes_no_potentially:3041433} Addressing deficits in the following areas: {deficits:3041436} Can the patient actively participate in an intensive therapy program of at least 3 hrs of therapy 5 days a week? {yes_no_potentially:3041433} The potential for patient to make measurable gains while on inpatient rehab is {potential:3041437} Anticipated functional outcomes upon discharge from inpatient rehab: {functional outcomes:304600100} PT, {functional outcomes:304600100} OT, {functional outcomes:304600100} SLP Estimated rehab length of stay to reach the above functional goals is: *** Anticipated discharge destination: {anticipated dc setting:21604} 10. Overall Rehab/Functional Prognosis: {potential:3041437}   MD Signature: ***    [1]  Current Facility-Administered Medications:    acetaminophen  (TYLENOL ) tablet 650 mg, 650 mg, Oral, Q4H PRN **OR** acetaminophen  (TYLENOL ) 160 MG/5ML solution 650 mg, 650 mg, Per Tube, Q4H PRN **OR** acetaminophen  (TYLENOL ) suppository 650 mg, 650 mg, Rectal, Q4H PRN, Lou, Claretta HERO, MD   aspirin  EC tablet 81 mg, 81 mg, Oral, Daily, Lou Claretta HERO, MD, 81 mg at 10/22/24 1124  cholecalciferol  (VITAMIN D3) 25 MCG (1000 UNIT) tablet 5,000 Units, 5,000 Units, Oral, Daily, Lou Claretta HERO, MD, 5,000 Units at 10/22/24 1124   clopidogrel  (PLAVIX ) tablet 75 mg, 75 mg, Oral, Daily, Lou Claretta HERO, MD, 75 mg at 10/22/24 1124   enoxaparin  (LOVENOX ) injection 40 mg, 40 mg, Subcutaneous, Q24H, Amponsah, Claretta HERO, MD, 40 mg at 10/22/24 1124   escitalopram  (LEXAPRO ) tablet 10 mg, 10 mg, Oral, Daily, Amponsah, Prosper M, MD, 10 mg at  10/22/24 1124   ezetimibe  (ZETIA ) tablet 10 mg, 10 mg, Oral, Daily, Amponsah, Prosper M, MD, 10 mg at 10/22/24 1124   levothyroxine  (SYNTHROID ) tablet 150 mcg, 150 mcg, Oral, Q0600, Vernon Ranks, MD, 150 mcg at 10/22/24 0522   mirabegron  ER (MYRBETRIQ ) tablet 25 mg, 25 mg, Oral, Daily, Amponsah, Prosper M, MD, 25 mg at 10/22/24 1124   ondansetron  (ZOFRAN ) injection 4 mg, 4 mg, Intravenous, Q6H PRN, Briana Elgin LABOR, MD, 4 mg at 10/21/24 1822   pantoprazole  (PROTONIX ) EC tablet 40 mg, 40 mg, Oral, Daily, Lou Claretta HERO, MD, 40 mg at 10/22/24 1124   prochlorperazine  (COMPAZINE ) injection 10 mg, 10 mg, Intravenous, Q6H PRN, Howerter, Justin B, DO, 10 mg at 10/21/24 2033   senna-docusate (Senokot-S) tablet 1 tablet, 1 tablet, Oral, QHS PRN, Lou Claretta HERO, MD   sodium chloride  flush (NS) 0.9 % injection 3 mL, 3 mL, Intravenous, Once, Prosperi, Christian H, PA-C   topiramate  (TOPAMAX ) tablet 150 mg, 150 mg, Oral, QHS, Amponsah, Prosper M, MD, 150 mg at 10/21/24 2034   traMADol  (ULTRAM ) tablet 50 mg, 50 mg, Oral, TID PRN, Briana Elgin LABOR, MD

## 2024-10-22 NOTE — Plan of Care (Signed)
   Problem: Education: Goal: Knowledge of disease or condition will improve Outcome: Progressing Goal: Knowledge of secondary prevention will improve (MUST DOCUMENT ALL) Outcome: Progressing Goal: Knowledge of patient specific risk factors will improve (DELETE if not current risk factor) Outcome: Progressing   Problem: Ischemic Stroke/TIA Tissue Perfusion: Goal: Complications of ischemic stroke/TIA will be minimized Outcome: Progressing   Problem: Coping: Goal: Will verbalize positive feelings about self Outcome: Progressing Goal: Will identify appropriate support needs Outcome: Progressing   Problem: Health Behavior/Discharge Planning: Goal: Ability to manage health-related needs will improve Outcome: Progressing Goal: Goals will be collaboratively established with patient/family Outcome: Progressing   Problem: Self-Care: Goal: Ability to participate in self-care as condition permits will improve Outcome: Progressing Goal: Verbalization of feelings and concerns over difficulty with self-care will improve Outcome: Progressing Goal: Ability to communicate needs accurately will improve Outcome: Progressing   Problem: Nutrition: Goal: Risk of aspiration will decrease Outcome: Progressing Goal: Dietary intake will improve Outcome: Progressing   Problem: Education: Goal: Knowledge of General Education information will improve Description: Including pain rating scale, medication(s)/side effects and non-pharmacologic comfort measures Outcome: Progressing   Problem: Health Behavior/Discharge Planning: Goal: Ability to manage health-related needs will improve Outcome: Progressing   Problem: Clinical Measurements: Goal: Ability to maintain clinical measurements within normal limits will improve Outcome: Progressing Goal: Will remain free from infection Outcome: Progressing Goal: Diagnostic test results will improve Outcome: Progressing Goal: Respiratory complications will  improve Outcome: Progressing Goal: Cardiovascular complication will be avoided Outcome: Progressing   Problem: Activity: Goal: Risk for activity intolerance will decrease Outcome: Progressing   Problem: Nutrition: Goal: Adequate nutrition will be maintained Outcome: Progressing   Problem: Coping: Goal: Level of anxiety will decrease Outcome: Progressing   Problem: Elimination: Goal: Will not experience complications related to bowel motility Outcome: Progressing Goal: Will not experience complications related to urinary retention Outcome: Progressing   Problem: Pain Managment: Goal: General experience of comfort will improve and/or be controlled Outcome: Progressing   Problem: Safety: Goal: Ability to remain free from injury will improve Outcome: Progressing   Problem: Skin Integrity: Goal: Risk for impaired skin integrity will decrease Outcome: Progressing   Problem: Education: Goal: Knowledge of disease or condition will improve Outcome: Progressing Goal: Knowledge of secondary prevention will improve (MUST DOCUMENT ALL) Outcome: Progressing Goal: Knowledge of patient specific risk factors will improve (DELETE if not current risk factor) Outcome: Progressing   Problem: Ischemic Stroke/TIA Tissue Perfusion: Goal: Complications of ischemic stroke/TIA will be minimized Outcome: Progressing   Problem: Coping: Goal: Will verbalize positive feelings about self Outcome: Progressing Goal: Will identify appropriate support needs Outcome: Progressing   Problem: Health Behavior/Discharge Planning: Goal: Ability to manage health-related needs will improve Outcome: Progressing Goal: Goals will be collaboratively established with patient/family Outcome: Progressing   Problem: Self-Care: Goal: Ability to participate in self-care as condition permits will improve Outcome: Progressing Goal: Verbalization of feelings and concerns over difficulty with self-care will  improve Outcome: Progressing Goal: Ability to communicate needs accurately will improve Outcome: Progressing   Problem: Nutrition: Goal: Risk of aspiration will decrease Outcome: Progressing Goal: Dietary intake will improve Outcome: Progressing

## 2024-10-23 DIAGNOSIS — E039 Hypothyroidism, unspecified: Secondary | ICD-10-CM | POA: Diagnosis not present

## 2024-10-23 DIAGNOSIS — I639 Cerebral infarction, unspecified: Secondary | ICD-10-CM | POA: Diagnosis not present

## 2024-10-23 NOTE — Plan of Care (Signed)
   Problem: Coping: Goal: Will verbalize positive feelings about self Outcome: Progressing

## 2024-10-23 NOTE — Progress Notes (Signed)
" ° °  Inpatient Rehabilitation Admissions Coordinator   I continue to await insurance determination for possible CIR admit.  Heron Leavell, RN, MSN Rehab Admissions Coordinator [phone] 10/23/2024 5:34 PM  "

## 2024-10-23 NOTE — Progress Notes (Signed)
 "  PROGRESS NOTE    Paula Murphy  FMW:[id number] DOB: 1946/10/12 DOA: 10/19/2024 PCP: Nichole Senior, MD   Brief Narrative: Paula Murphy is a 78 y.o. female with a history of osteoarthritis, fibromyalgia, hyperlipidemia, hypothyroidism, degenerative disc disease, depression, nephrolithiasis.  Patient presented secondary to right-sided weakness with initial concern for TIA versus stroke.  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  Patient started on dual antiplatelet therapy with aspirin  and Plavix .  Plan for discharge to acute inpatient rehab.   Assessment and Plan:  Acute stroke Patient presented with symptoms including right-sided weakness concerning for stroke/TIA.  CT of the head and neck obtained and was significant for no acute intracranial abnormality and no large vessel occlusion or hemodynamically significant stenosis at the head or neck.  MRI brain (12/15) confirms a small acute ischemic nonhemorrhagic infarct in the left basal ganglia. LDL of 85. Hemoglobin A1C of 5.3%. Transthoracic Echocardiogram significant for no atrial level shunt. Neurology recommendations for Plavix  for 3 weeks in addition to indefinite aspirin . PT/OT recommendations for CIR. - Continue Plavix , aspirin  and Zetia   Fibromyalgia Prior to arrival medication(s) include Robaxin , ibuprofen , tramadol  - Continue tramadol  as needed  Hypothyroidism Prior to arrival medication(s) include levothyroxine . - Continue levothyroxine   GERD Prior to arrival medication(s) include Protonix . - Continue Protonix   Hyperlipidemia Prior to arrival medication(s) include Zetia .  Patient has an intolerance to statin therapy.  Neurology recommendation for continuing Zetia  in addition to considering Repatha as an outpatient  Depression Anxiety Prior to arrival medication(s) include alprazolam  and Lexapro .  Lexapro  restarted on admission. - Continue Lexapro   Nausea/vomiting Patient with multiple episodes of emesis on  12/17, possibly related to food she ate. She reports symptoms have now resolved. - Continue Zofran  and Compazine  as needed   DVT prophylaxis: Lovenox  Code Status:   Code Status: Full Code Family Communication: None at bedside Disposition Plan: Plan to discharge to acute inpatient rehab pending bed/insurance authorization   Consultants:  Neurology  Procedures:  Transthoracic echocardiogram  Antimicrobials: None   Subjective: Did not sleep well overnight. Otherwise, no concerns.  Objective: BP 132/68 (BP Location: Right Arm)   Pulse 76   Temp 98.4 F (36.9 C) (Oral)   Resp 17   Ht 5' (1.524 m)   Wt 66.2 kg   SpO2 97%   BMI 28.51 kg/m   Examination:  General exam: Appears calm and comfortable. Respiratory system: Respiratory effort normal. Cardiovascular system: S1 & S2 heard, RRR.  Gastrointestinal system: Abdomen is nondistended, soft and nontender. Normal bowel sounds heard. Central nervous system: Alert and oriented. Musculoskeletal: BLE edema. Psychiatry: Judgement and insight appear normal. Mood & affect appropriate.    Data Reviewed: I have personally reviewed following labs and imaging studies  CBC Lab Results  Component Value Date   WBC 6.2 10/20/2024   RBC 3.81 (L) 10/20/2024   HGB 12.1 10/20/2024   HCT 37.1 10/20/2024   MCV 97.4 10/20/2024   MCH 31.8 10/20/2024   PLT 188 10/20/2024   MCHC 32.6 10/20/2024   RDW 14.1 10/20/2024   LYMPHSABS 2.0 10/19/2024   MONOABS 0.4 10/19/2024   EOSABS 0.2 10/19/2024   BASOSABS 0.0 10/19/2024     Last metabolic panel Lab Results  Component Value Date   NA 140 10/22/2024   K 3.7 10/22/2024   CL 111 10/22/2024   CO2 23 10/22/2024   BUN 9 10/22/2024   CREATININE 0.59 10/22/2024   GLUCOSE 91 10/22/2024   GFRNONAA >60 10/22/2024   GFRAA 85  11/30/2020   CALCIUM 8.2 (L) 10/22/2024   PROT 6.7 10/19/2024   ALBUMIN 3.8 10/19/2024   BILITOT 0.3 10/19/2024   ALKPHOS 56 10/19/2024   AST 18 10/19/2024    ALT 10 10/19/2024   ANIONGAP 6 10/22/2024    GFR: Estimated Creatinine Clearance: 50 mL/min (by C-G formula based on SCr of 0.59 mg/dL).  No results found for this or any previous visit (from the past 240 hours).    Radiology Studies: No results found.     LOS: 2 days    Elgin Lam, MD [facility] 10/23/2024, 11:38 AM   If 7PM-7AM, please contact night-coverage [email or web address]  "

## 2024-10-23 NOTE — TOC CAGE-AID Note (Signed)
 Transition of Care [facility]) - CAGE-AID Screening   Patient Details  Name: Paula Murphy MRN: [id number] Date of Birth: 05-11-1946  Transition [facility]) CM/SW Contact:    Andrez JULIANNA George, RN Phone Number: 10/23/2024, 11:15 AM   Clinical Narrative:  Pt denied the use of alcohol or drugs  CAGE-AID Screening:    Have You Ever Felt You Ought to Cut Down on Your Drinking or Drug Use?: No Have People Annoyed You By Critizing Your Drinking Or Drug Use?: No Have You Felt Bad Or Guilty About Your Drinking Or Drug Use?: No Have You Ever Had a Drink or Used Drugs First Thing In The Morning to Steady Your Nerves or to Get Rid of a Hangover?: No CAGE-AID Score: 0  Substance Abuse Education Offered: No (no alcohol or drug use)

## 2024-10-23 NOTE — Plan of Care (Signed)
   Problem: Education: Goal: Knowledge of disease or condition will improve Outcome: Progressing Goal: Knowledge of secondary prevention will improve (MUST DOCUMENT ALL) Outcome: Progressing Goal: Knowledge of patient specific risk factors will improve (DELETE if not current risk factor) Outcome: Progressing

## 2024-10-23 NOTE — TOC Progression Note (Signed)
 Transition of Care [facility]) - Progression Note    Patient Details  Name: Paula Murphy MRN: [id number] Date of Birth: 01/20/1946  [facility]) CM/SW Contact  Andrez JULIANNA George, RN Phone Number: 10/23/2024, 11:16 AM  Clinical Narrative:     Awaiting insurance approval for CIR.  IP Care management following.  Expected Discharge Plan: IP Rehab Facility                 Expected Discharge Plan and Services                                               Social Drivers of Health (SDOH) Interventions SDOH Screenings   Food Insecurity: No Food Insecurity (10/20/2024)  Housing: Low Risk (10/20/2024)  Transportation Needs: No Transportation Needs (10/20/2024)  Utilities: Not At Risk (10/20/2024)  Social Connections: Moderately Integrated (10/20/2024)  Tobacco Use: Low Risk (10/20/2024)    Readmission Risk Interventions     No data to display

## 2024-10-23 NOTE — Care Management Important Message (Signed)
 Important Message  Patient Details  Name: Paula Murphy MRN: [id number] Date of Birth: Nov 22, 1945   Important Message Given:  Yes - Medicare IM     Claretta Deed 10/23/2024, 1:51 PM

## 2024-10-23 NOTE — H&P (Shared)
 "   Physical Medicine and Rehabilitation Admission H&P    Chief Complaint  Patient presents with   Extremity Weakness  : HPI: Paula Murphy is a 78 year old right-handed female with history significant for osteoarthritis with bilateral knee replacements/fibromyalgia followed by Dr. Dolphus, hyperlipidemia, hypothyroidism, macular degeneration, nephrolithiasis and depression.  Per chart review patient lives alone.  1 level apartment.  Independent driving prior to admission.  Family lives close by with excellent support.  Presented 10/19/2024 with acute onset of right sided weakness and facial droop.  CT//CTA showed no acute intracranial abnormality no large vessel occlusion or hemodynamically significant stenosis.  MRI showed small acute ischemic nonhemorrhagic perforator type infarct in the left basal ganglia.  Patient did not receive TNK.  Admission chemistries unremarkable except glucose 110.  Echocardiogram with ejection fraction of 60 to 65% no wall motion abnormality grade 1 diastolic dysfunction.  Neurology follow-up placed on aspirin  81 mg daily and Plavix  75 mg daily x 3 weeks then aspirin  alone for CVA prophylaxis.  Lovenox  added for DVT prophylaxis.  Patient is tolerating a regular consistency diet.  Therapy evaluations completed due to patient decreased functional ability and right side weakness was admitted for a comprehensive rehab program.  Review of Systems  Constitutional:  Negative for chills and fever.  HENT:  Negative for hearing loss.   Eyes:  Positive for blurred vision. Negative for double vision.  Respiratory:  Negative for cough and shortness of breath.   Cardiovascular:  Negative for chest pain, palpitations and leg swelling.  Gastrointestinal:  Positive for constipation. Negative for heartburn, nausea and vomiting.  Genitourinary:  Negative for dysuria, flank pain and hematuria.  Musculoskeletal:  Positive for joint pain and myalgias.  Skin:  Negative for rash.   Neurological:  Positive for sensory change and weakness.  Psychiatric/Behavioral:  Positive for depression.        Anxiety  All other systems reviewed and are negative.  Past Medical History:  Diagnosis Date   Arthritis    OA AND PAIN RT KNEE   Autoimmune disease    + ANA, and DS DNA--PT STATES SHE WAS TOLD SHE DOES NOT HAVE LUPUS AS PREVIOUSLY THOUGHT   Fibromyalgia    followed by Dr. 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   on face   TONSILLECTOMY  11/06/1951   TOTAL KNEE ARTHROPLASTY Right 03/02/2013   Procedure: RIGHT TOTAL KNEE ARTHROPLASTY;  Surgeon: Paula LULLA Moan, MD;  Location: [facility];  Service: Orthopedics;  Laterality: Right;   TOTAL KNEE ARTHROPLASTY Left 10/10/2020   Procedure: TOTAL KNEE ARTHROPLASTY;  Surgeon: Murphy Dempsey, MD;  Location: [facility];  Service: Orthopedics;  Laterality: Left;    Family History  Problem Relation Age of Onset   Stroke Mother    Hypertension Mother    Hyperlipidemia Mother    Cancer Mother        Lung   Alzheimer's  disease Mother    COPD Mother    Stroke Father    Hypertension Father    Diabetes Father        Type II   Hyperlipidemia Father    Cancer Father        Lung   Arthritis Father        Rheumatoid   COPD Father    COPD Brother    Other Brother        back surgery   Thyroid  disease Daughter    Breast cancer Cousin    Breast cancer Cousin    Social History:  reports that she has never smoked. She has been exposed to  tobacco smoke. She has never used smokeless tobacco. She reports current alcohol use. She reports that she does not use drugs. Allergies: Allergies[1] Facility-Administered Medications Prior to Admission  Medication Dose Route Frequency Provider Last Rate Last Admin   [START ON 01/09/2025] denosumab  (PROLIA ) injection 60 mg  60 mg Subcutaneous Q6 months        Medications Prior to Admission  Medication Sig Dispense Refill   ALPRAZolam  (XANAX  XR) 0.5 MG 24 hr tablet Take 0.5 mg by mouth daily.     Cholecalciferol  (VITAMIN D3) 5000 units CAPS Take 5,000 Units by mouth daily.      denosumab  (PROLIA ) 60 MG/ML SOSY injection Inject 60 mg into the skin every 6 (six) months. CALL PT FOR PAYMENT INFORMATION. Courier to rheum: [address], [address], [place] [address]. Appt on 07/13/2024 1 mL 0   escitalopram  (LEXAPRO ) 10 MG tablet Take 10 mg by mouth daily.     ezetimibe  (ZETIA ) 10 MG tablet Take 10 mg by mouth daily.      fluticasone (FLONASE) 50 MCG/ACT nasal spray Place 1 spray into both nostrils daily as needed for allergies.      GEMTESA 75 MG TABS Take 1 tablet by mouth daily.     ibuprofen  (ADVIL ) 800 MG tablet Take 1 tablet by mouth daily as needed.     levothyroxine  (SYNTHROID ) 150 MCG tablet Take 150 mcg by mouth daily before breakfast.     loperamide  (IMODIUM ) 2 MG capsule Take 1 capsule (2 mg total) by mouth 4 (four) times daily as needed for diarrhea or loose stools. 12 capsule 0   methocarbamol  (ROBAXIN ) 500 MG tablet Take 500 mg by mouth at bedtime as needed for muscle spasms.     Multiple Vitamins-Minerals (PRESERVISION AREDS) CAPS Take 1 capsule by mouth daily.     ondansetron  (ZOFRAN -ODT) 4 MG disintegrating tablet Take 1 tablet (4 mg total) by mouth every 8 (eight) hours as needed. 20 tablet 0   pantoprazole  (PROTONIX ) 40 MG tablet Take 40 mg by mouth daily.     topiramate  (TOPAMAX ) 50 MG tablet TAKE 3 TABLETS BY MOUTH DAILY (Patient taking differently: Take 150 mg by mouth at  bedtime.) 270 tablet 0   traMADol  (ULTRAM ) 50 MG tablet Take 50 mg by mouth 3 (three) times daily as needed for moderate pain (pain score 4-6).     valACYclovir  (VALTREX ) 500 MG tablet Take 500 mg by mouth daily as needed (fever blister).      prochlorperazine  (COMPAZINE ) 5 MG tablet Take 1 tablet (5 mg total) by mouth 2 (two) times daily as needed for up to 10 doses for nausea or vomiting. (Patient not taking: No sig reported) 10 tablet 0      Home: Home Living Family/patient expects to be discharged to:: Private residence Living Arrangements: Alone  Available Help at Discharge: Family, Friend(s) (states dtr, and friends initially could give close to 24/7) Type of Home: House Home Access: Level entry Home Layout: One level Bathroom Shower/Tub: Tub/shower unit, Engineer, Building Services: Standard Bathroom Accessibility: Yes Home Equipment: Agricultural Consultant (2 wheels), Rollator (4 wheels), Shower seat, Grab bars - tub/shower, Hand held shower head Additional Comments: LLE weakness ever since her 2nd COVID shot in 2021. Family lives nearby, very supportive  Lives With: Alone   Functional History: Prior Function Prior Level of Function : Independent/Modified Independent, Driving  Functional Status:  Mobility: Bed Mobility Overal bed mobility: Modified Independent Bed Mobility: Supine to Sit, Sit to Supine Supine to sit: Contact guard Sit to supine: Min assist General bed mobility comments: exited to the L with HOB elevated Transfers Overall transfer level: Needs assistance Equipment used: Rolling walker (2 wheels) Transfers: Sit to/from Stand Sit to Stand: Contact guard assist Bed to/from chair/wheelchair/BSC transfer type:: Step pivot Step pivot transfers: Contact guard assist General transfer comment: VC for hand placement and safe use of RW; vc for marching steps as turning to keep Left foot from dragging/catching (required cues with each turn) Ambulation/Gait Ambulation/Gait  assistance: Min assist Gait Distance (Feet): 180 Feet Assistive device: Rolling walker (2 wheels) Gait Pattern/deviations: Decreased step length - right, Decreased step length - left, Decreased dorsiflexion - left, Shuffle, Narrow base of support, Step-through pattern, Drifts right/left General Gait Details: better carryover of longer strides, emphasis on heelstrike (mod cues to maintain); ambulation backwards with RW with min assist x9ft; vc for marching steps during turns to improve left foot clearan Gait velocity: decreased Gait velocity interpretation: <1.31 ft/sec, indicative of household ambulator Stairs: Yes Stairs assistance: Contact guard assist Stair Management: Two rails, Forwards, Step to pattern Number of Stairs: 4 General stair comments: for strengthening; 6-7 step height    ADL: ADL Overall ADL's : Needs assistance/impaired Eating/Feeding: Independent Eating/Feeding Details (indicate cue type and reason): supported sitting Grooming: Supervision/safety, Standing, Wash/dry hands, Brushing hair Grooming Details (indicate cue type and reason): standing at sink, no LOB Upper Body Bathing: Set up Upper Body Bathing Details (indicate cue type and reason): supported sitting Lower Body Bathing: Minimal assistance Lower Body Bathing Details (indicate cue type and reason): CGA sit<>stand Upper Body Dressing : Minimal assistance, Standing Upper Body Dressing Details (indicate cue type and reason): donned second gown around back, assist to fully thread UE's through arm holes Lower Body Dressing: Minimal assistance Lower Body Dressing Details (indicate cue type and reason): CGA sit<>stand Toilet Transfer: Contact guard assist, Ambulation, Regular Toilet Toilet Transfer Details (indicate cue type and reason): CGA to bathroom on way back into room. pushed RW to side, furniture walked to bathroom. after seated rest break on toilet w/ toileting task, able to walk out of bathroom with   improved stability without AD Toileting- Clothing Manipulation and Hygiene: Supervision/safety, Sitting/lateral lean, Sit to/from stand Toileting - Clothing Manipulation Details (indicate cue type and reason): able to manage depends without assist Tub/ Shower Transfer: Contact guard assist, Ambulation, Rolling walker (2 wheels) Tub/Shower Transfer Details (indicate cue type and reason): guided in/out of tub with VC for safe hand placement, proper foot placement, and safe home setup (holding onto walls for stability to simulate home environment) Functional mobility during ADLs: Supervision/safety, Contact guard assist, Rolling walker (2 wheels) General ADL Comments: RW for hallway mobility. pt mindful to heel toe and taking bigger steps. improved ability to stay in RW. However with fatigue, pt with more shuffling steps, swaying-cued for  standing rest break  Cognition: Cognition Orientation Level: Oriented X4 Cognition Arousal: Alert Behavior During Therapy: WFL for tasks assessed/performed, Flat affect  Physical Exam: Blood pressure 126/71, pulse 70, temperature 98.1 F (36.7 C), temperature source Oral, resp. rate 17, height 5' (1.524 m), weight 66.2 kg, SpO2 95%. Physical Exam  No results found for this or any previous visit (from the past 48 hours).  No results found.    Blood pressure 126/71, pulse 70, temperature 98.1 F (36.7 C), temperature source Oral, resp. rate 17, height 5' (1.524 m), weight 66.2 kg, SpO2 95%.  Medical Problem List and Plan: 1. Functional deficits secondary to left basal ganglia infarction  -patient may *** shower  -ELOS/Goals: *** 2.  Antithrombotics: -DVT/anticoagulation:  Pharmaceutical: Lovenox   -antiplatelet therapy: Aspirin  81 mg daily and Plavix  75 mg daily x 3 weeks then aspirin  alone 3. Pain Management/fibromyalgia: Topamax  150 mg nightly, tramadol  as needed 4. Mood/Behavior/Sleep: Lexapro  10 mg daily.  Provide emotional  support  -antipsychotic agents: N/A 5. Neuropsych/cognition: This patient is capable of making decisions on her own behalf. 6. Skin/Wound Care: Routine skin checks 7. Fluids/Electrolytes/Nutrition: Routine ins and outs with follow-up chemistries 8.  Hypothyroidism.  Synthroid  9.  Hyperlipidemia.  Zetia .  Considering Repatha as an outpatient 10.  GERD.  Protonix  11.  Macular degeneration.  Follow-up outpatient   Toribio JINNY Pitch, PA-C 10/28/2024     [1]  Allergies Allergen Reactions   Codeine Nausea And Vomiting    NAUSEA & VOMITING   Hydrocodone Nausea And Vomiting   Plaquenil  [Hydroxychloroquine ]     Vision changes per patient    "

## 2024-10-23 NOTE — Progress Notes (Addendum)
 Occupational Therapy Treatment Patient Details Name: Paula Murphy MRN: [id number] DOB: January 23, 1946 Today's Date: 10/23/2024   History of present illness 78 yo F adm 10/19/24 with R facial droop and R side weakness. MRI(+) L basal ganglia CVA. NIH 0 PMH: HLD, macular degeneration, nephrolithiasis, hypothyroidism, HTN, fibromyalgia, LTKA   OT comments  Pt making continued progress towards OT goals. Pt reports not sleeping well d/t fibromyalgia pain but agreeable to participate. Pt able to mobilize in hallway with CGA using RW though with muscle fatigue, noted increased shuffling and sway with RW- cued for standing rest breaks as needed. Pt able to manage toileting task and ADLs standing at sink with Supervision. Friend at bedside and supportive. Pt hoping to hear back about potentially being able to go to intensive rehab > 3 hours per day. Continue to recommend intensive rehab to maximize overall safety and independence with daily routine, as well as to decrease overall fall risk.      If plan is discharge home, recommend the following:  A little help with walking and/or transfers;A little help with bathing/dressing/bathroom;Assistance with cooking/housework;Assist for transportation;Help with stairs or ramp for entrance   Equipment Recommendations  None recommended by OT    Recommendations for Other Services Rehab consult    Precautions / Restrictions Precautions Precautions: Fall Recall of Precautions/Restrictions: Intact Restrictions Weight Bearing Restrictions Per Provider Order: No       Mobility Bed Mobility Overal bed mobility: Modified Independent                  Transfers Overall transfer level: Needs assistance Equipment used: Rolling walker (2 wheels), None Transfers: Sit to/from Stand Sit to Stand: Supervision                 Balance Overall balance assessment: Needs assistance Sitting-balance support: No upper extremity supported, Feet  supported Sitting balance-Leahy Scale: Fair     Standing balance support: No upper extremity supported, During functional activity Standing balance-Leahy Scale: Fair                             ADL either performed or assessed with clinical judgement   ADL Overall ADL's : Needs assistance/impaired     Grooming: Supervision/safety;Standing;Wash/dry hands;Brushing hair Grooming Details (indicate cue type and reason): standing at sink, no LOB                 Toilet Transfer: Economist Details (indicate cue type and reason): CGA to bathroom on way back into room. pushed RW to side, furniture walked to bathroom. after seated rest break on toilet w/ toileting task, able to walk out of bathroom with  improved stability without AD Toileting- Clothing Manipulation and Hygiene: Supervision/safety;Sitting/lateral lean;Sit to/from stand       Functional mobility during ADLs: Contact guard assist;Rolling walker (2 wheels) General ADL Comments: RW for hallway mobility. pt mindful to heel toe and taking bigger steps. improved ability to stay in RW. However with fatigue, pt with more shuffling steps, swaying-cued for standing rest break    Extremity/Trunk Assessment Upper Extremity Assessment Upper Extremity Assessment: Overall WFL for tasks assessed;Right hand dominant   Lower Extremity Assessment Lower Extremity Assessment: Defer to PT evaluation        Vision   Vision Assessment?: No apparent visual deficits   Perception     Praxis     Communication Communication Communication: No apparent difficulties   Cognition Arousal:  Alert Behavior During Therapy: [facility] for tasks assessed/performed Cognition: No apparent impairments                               Following commands: Intact        Cueing   Cueing Techniques: Verbal cues  Exercises      Shoulder Instructions       General Comments  Friend at bedside    Pertinent Vitals/ Pain       Pain Assessment Pain Assessment: No/denies pain  Home Living                                          Prior Functioning/Environment              Frequency  Min 2X/week        Progress Toward Goals  OT Goals(current goals can now be found in the care plan section)  Progress towards OT goals: Progressing toward goals     Plan      Co-evaluation                 AM-PAC OT 6 Clicks Daily Activity     Outcome Measure   Help from another person eating meals?: None Help from another person taking care of personal grooming?: A Little Help from another person toileting, which includes using toliet, bedpan, or urinal?: A Little Help from another person bathing (including washing, rinsing, drying)?: A Little Help from another person to put on and taking off regular upper body clothing?: A Little Help from another person to put on and taking off regular lower body clothing?: A Little 6 Click Score: 19    End of Session Equipment Utilized During Treatment: Gait belt;Rolling walker (2 wheels)  OT Visit Diagnosis: Unsteadiness on feet (R26.81);Other abnormalities of gait and mobility (R26.89);Muscle weakness (generalized) (M62.81)   Activity Tolerance Patient tolerated treatment well   Patient Left in bed;with call bell/phone within reach;with bed alarm set;with family/visitor present   Nurse Communication Mobility status;Other (comment) (tramodol)        Time: 1245-1310 OT Time Calculation (min): 25 min  Charges: OT General Charges $OT Visit: 1 Visit OT Treatments $Self Care/Home Management : 8-22 mins $Therapeutic Activity: 8-22 mins  Mliss NOVAK, OTR/L Acute Rehab Services Office: [phone]   Mliss Fish 10/23/2024, 1:47 PM

## 2024-10-24 DIAGNOSIS — I639 Cerebral infarction, unspecified: Secondary | ICD-10-CM | POA: Diagnosis not present

## 2024-10-24 DIAGNOSIS — E039 Hypothyroidism, unspecified: Secondary | ICD-10-CM | POA: Diagnosis not present

## 2024-10-24 NOTE — Progress Notes (Signed)
 "  PROGRESS NOTE    Paula Murphy  FMW:[id number] DOB: 07/17/1946 DOA: 10/19/2024 PCP: Nichole Senior, MD   Brief Narrative: Paula Murphy is a 78 y.o. female with a history of osteoarthritis, fibromyalgia, hyperlipidemia, hypothyroidism, degenerative disc disease, depression, nephrolithiasis.  Patient presented secondary to right-sided weakness with initial concern for TIA versus stroke.  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  Patient started on dual antiplatelet therapy with aspirin  and Plavix .  Plan for discharge to acute inpatient rehab.   Assessment and Plan:  Acute stroke Patient presented with symptoms including right-sided weakness concerning for stroke/TIA.  CT of the head and neck obtained and was significant for no acute intracranial abnormality and no large vessel occlusion or hemodynamically significant stenosis at the head or neck.  MRI brain (12/15) confirms a small acute ischemic nonhemorrhagic infarct in the left basal ganglia. LDL of 85. Hemoglobin A1C of 5.3%. Transthoracic Echocardiogram significant for no atrial level shunt. Neurology recommendations for Plavix  for 3 weeks in addition to indefinite aspirin . PT/OT recommendations for CIR. - Continue Plavix , aspirin  and Zetia   Fibromyalgia Prior to arrival medication(s) include Robaxin , ibuprofen , tramadol  - Continue tramadol  as needed  Hypothyroidism Prior to arrival medication(s) include levothyroxine . - Continue levothyroxine   GERD Prior to arrival medication(s) include Protonix . - Continue Protonix   Hyperlipidemia Prior to arrival medication(s) include Zetia .  Patient has an intolerance to statin therapy.  Neurology recommendation for continuing Zetia  in addition to considering Repatha as an outpatient  Depression Anxiety Prior to arrival medication(s) include alprazolam  and Lexapro .  Lexapro  restarted on admission. - Continue Lexapro   Nausea/vomiting Patient with multiple episodes of emesis on  12/17, possibly related to food she ate. She reports symptoms have now resolved. - Continue Zofran  and Compazine  as needed   DVT prophylaxis: Lovenox  Code Status:   Code Status: Full Code Family Communication: None at bedside Disposition Plan: Plan to discharge to acute inpatient rehab pending bed/insurance authorization   Consultants:  Neurology  Procedures:  Transthoracic echocardiogram  Antimicrobials: None   Subjective: Slept better today. No issues overnight.  Objective: BP (!) 148/71 (BP Location: Right Arm)   Pulse 73   Temp 98.2 F (36.8 C) (Oral)   Resp 18   Ht 5' (1.524 m)   Wt 66.2 kg   SpO2 99%   BMI 28.51 kg/m   Examination:  General exam: Appears calm and comfortable. Respiratory system: Respiratory effort normal. Central nervous system: Alert and oriented. Psychiatry: Judgement and insight appear normal. Mood & affect appropriate.    Data Reviewed: I have personally reviewed following labs and imaging studies  CBC Lab Results  Component Value Date   WBC 6.2 10/20/2024   RBC 3.81 (L) 10/20/2024   HGB 12.1 10/20/2024   HCT 37.1 10/20/2024   MCV 97.4 10/20/2024   MCH 31.8 10/20/2024   PLT 188 10/20/2024   MCHC 32.6 10/20/2024   RDW 14.1 10/20/2024   LYMPHSABS 2.0 10/19/2024   MONOABS 0.4 10/19/2024   EOSABS 0.2 10/19/2024   BASOSABS 0.0 10/19/2024     Last metabolic panel Lab Results  Component Value Date   NA 140 10/22/2024   K 3.7 10/22/2024   CL 111 10/22/2024   CO2 23 10/22/2024   BUN 9 10/22/2024   CREATININE 0.59 10/22/2024   GLUCOSE 91 10/22/2024   GFRNONAA >60 10/22/2024   GFRAA 85 11/30/2020   CALCIUM 8.2 (L) 10/22/2024   PROT 6.7 10/19/2024   ALBUMIN 3.8 10/19/2024   BILITOT 0.3 10/19/2024  ALKPHOS 56 10/19/2024   AST 18 10/19/2024   ALT 10 10/19/2024   ANIONGAP 6 10/22/2024    GFR: Estimated Creatinine Clearance: 50 mL/min (by C-G formula based on SCr of 0.59 mg/dL).  No results found for this or any  previous visit (from the past 240 hours).    Radiology Studies: No results found.     LOS: 3 days    Elgin Lam, MD [facility] 10/24/2024, 1:06 PM   If 7PM-7AM, please contact night-coverage [email or web address]  "

## 2024-10-24 NOTE — Plan of Care (Signed)
" °  Problem: Ischemic Stroke/TIA Tissue Perfusion: Goal: Complications of ischemic stroke/TIA will be minimized Outcome: Progressing   Problem: Health Behavior/Discharge Planning: Goal: Ability to manage health-related needs will improve Outcome: Progressing Goal: Goals will be collaboratively established with patient/family Outcome: Progressing   Problem: Self-Care: Goal: Ability to participate in self-care as condition permits will improve Outcome: Progressing Goal: Verbalization of feelings and concerns over difficulty with self-care will improve Outcome: Progressing Goal: Ability to communicate needs accurately will improve Outcome: Progressing   Problem: Nutrition: Goal: Risk of aspiration will decrease Outcome: Progressing Goal: Dietary intake will improve Outcome: Progressing   Problem: Clinical Measurements: Goal: Ability to maintain clinical measurements within normal limits will improve Outcome: Progressing Goal: Will remain free from infection Outcome: Progressing Goal: Diagnostic test results will improve Outcome: Progressing Goal: Respiratory complications will improve Outcome: Progressing Goal: Cardiovascular complication will be avoided Outcome: Progressing   Problem: Activity: Goal: Risk for activity intolerance will decrease Outcome: Progressing   Problem: Elimination: Goal: Will not experience complications related to bowel motility Outcome: Progressing Goal: Will not experience complications related to urinary retention Outcome: Progressing   Problem: Pain Managment: Goal: General experience of comfort will improve and/or be controlled Outcome: Progressing   "

## 2024-10-24 NOTE — Progress Notes (Signed)
" °   10/24/24 1905  Spiritual Encounters  Type of Visit Initial  Care provided to: Patient;Pt and family  Referral source Patient request  Reason for visit Routine spiritual support  OnCall Visit Yes   Chaplain responded to a consult. Patient has been in the hospital for five days and is getting a little restless. She hopes to go to the inpatient rehab unit here soon. Chaplain offered prayer and support.Chaplain introduced spiritual care services. Spiritual care services available as needed.   Juliene CHRISTELLA Das, Chaplain 10/24/2024  "

## 2024-10-25 DIAGNOSIS — E039 Hypothyroidism, unspecified: Secondary | ICD-10-CM | POA: Diagnosis not present

## 2024-10-25 DIAGNOSIS — I639 Cerebral infarction, unspecified: Secondary | ICD-10-CM | POA: Diagnosis not present

## 2024-10-25 NOTE — Plan of Care (Signed)
" °  Problem: Self-Care: Goal: Ability to participate in self-care as condition permits will improve Outcome: Progressing Goal: Verbalization of feelings and concerns over difficulty with self-care will improve Outcome: Progressing Goal: Ability to communicate needs accurately will improve Outcome: Progressing   Problem: Nutrition: Goal: Risk of aspiration will decrease Outcome: Progressing Goal: Dietary intake will improve Outcome: Progressing   Problem: Coping: Goal: Will verbalize positive feelings about self Outcome: Progressing Goal: Will identify appropriate support needs Outcome: Progressing   Problem: Clinical Measurements: Goal: Ability to maintain clinical measurements within normal limits will improve Outcome: Progressing Goal: Will remain free from infection Outcome: Progressing Goal: Diagnostic test results will improve Outcome: Progressing Goal: Respiratory complications will improve Outcome: Progressing Goal: Cardiovascular complication will be avoided Outcome: Progressing   Problem: Activity: Goal: Risk for activity intolerance will decrease Outcome: Progressing   Problem: Elimination: Goal: Will not experience complications related to bowel motility Outcome: Progressing Goal: Will not experience complications related to urinary retention Outcome: Progressing   Problem: Pain Managment: Goal: General experience of comfort will improve and/or be controlled Outcome: Progressing   "

## 2024-10-25 NOTE — Plan of Care (Signed)
  Problem: Education: Goal: Knowledge of disease or condition will improve Outcome: Progressing   Problem: Education: Goal: Knowledge of patient specific risk factors will improve (DELETE if not current risk factor) Outcome: Progressing   Problem: Coping: Goal: Will verbalize positive feelings about self Outcome: Progressing

## 2024-10-25 NOTE — Plan of Care (Signed)
" °  Problem: Education: Goal: Knowledge of disease or condition will improve 10/25/2024 0602 by Gaetana Randall Mathew GORMAN, RN Outcome: Progressing 10/25/2024 0501 by Gaetana Randall Mathew GORMAN, RN Outcome: Progressing Goal: Knowledge of secondary prevention will improve (MUST DOCUMENT ALL) 10/25/2024 0602 by Gaetana Randall Mathew GORMAN, RN Outcome: Progressing 10/25/2024 0501 by Gaetana Randall Mathew GORMAN, RN Outcome: Progressing Goal: Knowledge of patient specific risk factors will improve (DELETE if not current risk factor) 10/25/2024 0602 by Gaetana Randall Mathew GORMAN, RN Outcome: Progressing 10/25/2024 0501 by Gaetana Randall Mathew GORMAN, RN Outcome: Progressing   Problem: Ischemic Stroke/TIA Tissue Perfusion: Goal: Complications of ischemic stroke/TIA will be minimized 10/25/2024 0602 by Gaetana Randall Mathew GORMAN, RN Outcome: Progressing 10/25/2024 0501 by Gaetana Randall Mathew GORMAN, RN Outcome: Progressing   Problem: Health Behavior/Discharge Planning: Goal: Ability to manage health-related needs will improve 10/25/2024 0602 by Gaetana Randall Mathew GORMAN, RN Outcome: Progressing 10/25/2024 0501 by Gaetana Randall Mathew GORMAN, RN Outcome: Progressing Goal: Goals will be collaboratively established with patient/family 10/25/2024 0602 by Gaetana Randall Mathew GORMAN, RN Outcome: Progressing 10/25/2024 0501 by Gaetana Randall Mathew GORMAN, RN Outcome: Progressing   Problem: Education: Goal: Knowledge of disease or condition will improve 10/25/2024 0602 by Gaetana Randall Mathew GORMAN, RN Outcome: Progressing 10/25/2024 0501 by Gaetana Randall Mathew GORMAN, RN Outcome: Progressing Goal: Knowledge of secondary prevention will improve (MUST DOCUMENT ALL) 10/25/2024 0602 by Gaetana Randall Mathew GORMAN, RN Outcome: Progressing 10/25/2024 0501 by Gaetana Randall Mathew GORMAN, RN Outcome: Progressing Goal: Knowledge of patient specific risk factors will improve (DELETE if not current risk factor) 10/25/2024 0602 by Gaetana Randall Mathew GORMAN,  RN Outcome: Progressing 10/25/2024 0501 by Gaetana Randall Mathew GORMAN, RN Outcome: Progressing   "

## 2024-10-25 NOTE — Plan of Care (Signed)
  Problem: Education: Goal: Knowledge of disease or condition will improve Outcome: Progressing Goal: Knowledge of secondary prevention will improve (MUST DOCUMENT ALL) Outcome: Progressing Goal: Knowledge of patient specific risk factors will improve (DELETE if not current risk factor) Outcome: Progressing   Problem: Ischemic Stroke/TIA Tissue Perfusion: Goal: Complications of ischemic stroke/TIA will be minimized Outcome: Progressing   Problem: Coping: Goal: Will verbalize positive feelings about self Outcome: Progressing

## 2024-10-25 NOTE — Progress Notes (Signed)
 "  PROGRESS NOTE    Paula Murphy  FMW:[id number] DOB: 1946/08/11 DOA: 10/19/2024 PCP: Nichole Senior, MD   Brief Narrative: Paula Murphy is a 78 y.o. female with a history of osteoarthritis, fibromyalgia, hyperlipidemia, hypothyroidism, degenerative disc disease, depression, nephrolithiasis.  Patient presented secondary to right-sided weakness with initial concern for TIA versus stroke.  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  Patient started on dual antiplatelet therapy with aspirin  and Plavix .  Plan for discharge to acute inpatient rehab.   Assessment and Plan:  Acute stroke Patient presented with symptoms including right-sided weakness concerning for stroke/TIA.  CT of the head and neck obtained and was significant for no acute intracranial abnormality and no large vessel occlusion or hemodynamically significant stenosis at the head or neck.  MRI brain (12/15) confirms a small acute ischemic nonhemorrhagic infarct in the left basal ganglia. LDL of 85. Hemoglobin A1C of 5.3%. Transthoracic Echocardiogram significant for no atrial level shunt. Neurology recommendations for Plavix  for 3 weeks in addition to indefinite aspirin . PT/OT recommendations for CIR. - Continue Plavix , aspirin  and Zetia   Fibromyalgia Prior to arrival medication(s) include Robaxin , ibuprofen , tramadol  - Continue tramadol  as needed  Hypothyroidism Prior to arrival medication(s) include levothyroxine . - Continue levothyroxine   GERD Prior to arrival medication(s) include Protonix . - Continue Protonix   Hyperlipidemia Prior to arrival medication(s) include Zetia .  Patient has an intolerance to statin therapy.  Neurology recommendation for continuing Zetia  in addition to considering Repatha as an outpatient  Depression Anxiety Prior to arrival medication(s) include alprazolam  and Lexapro .  Lexapro  restarted on admission. - Continue Lexapro   Nausea/vomiting Patient with multiple episodes of emesis on  12/17, possibly related to food she ate. She reports symptoms have now resolved. - Continue Zofran  and Compazine  as needed   DVT prophylaxis: Lovenox  Code Status:   Code Status: Full Code Family Communication: None at bedside Disposition Plan: Plan to discharge to acute inpatient rehab pending bed/insurance authorization   Consultants:  Neurology  Procedures:  Transthoracic echocardiogram  Antimicrobials: None   Subjective: Slept well. No issues this morning.  Objective: BP (!) 141/70 (BP Location: Right Arm)   Pulse 68   Temp 98.4 F (36.9 C) (Oral)   Resp 18   Ht 5' (1.524 m)   Wt 66.2 kg   SpO2 99%   BMI 28.51 kg/m   Examination:  General exam: Appears calm and comfortable. Respiratory system: Respiratory effort normal. Central nervous system: Alert and oriented. Psychiatry: Judgement and insight appear normal. Mood & affect appropriate.    Data Reviewed: I have personally reviewed following labs and imaging studies  CBC Lab Results  Component Value Date   WBC 6.2 10/20/2024   RBC 3.81 (L) 10/20/2024   HGB 12.1 10/20/2024   HCT 37.1 10/20/2024   MCV 97.4 10/20/2024   MCH 31.8 10/20/2024   PLT 188 10/20/2024   MCHC 32.6 10/20/2024   RDW 14.1 10/20/2024   LYMPHSABS 2.0 10/19/2024   MONOABS 0.4 10/19/2024   EOSABS 0.2 10/19/2024   BASOSABS 0.0 10/19/2024     Last metabolic panel Lab Results  Component Value Date   NA 140 10/22/2024   K 3.7 10/22/2024   CL 111 10/22/2024   CO2 23 10/22/2024   BUN 9 10/22/2024   CREATININE 0.59 10/22/2024   GLUCOSE 91 10/22/2024   GFRNONAA >60 10/22/2024   GFRAA 85 11/30/2020   CALCIUM 8.2 (L) 10/22/2024   PROT 6.7 10/19/2024   ALBUMIN 3.8 10/19/2024   BILITOT 0.3 10/19/2024  ALKPHOS 56 10/19/2024   AST 18 10/19/2024   ALT 10 10/19/2024   ANIONGAP 6 10/22/2024    GFR: Estimated Creatinine Clearance: 50 mL/min (by C-G formula based on SCr of 0.59 mg/dL).  No results found for this or any previous  visit (from the past 240 hours).    Radiology Studies: No results found.     LOS: 4 days    Elgin Lam, MD [facility] 10/25/2024, 2:32 PM   If 7PM-7AM, please contact night-coverage [email or web address]  "

## 2024-10-25 NOTE — Progress Notes (Signed)
 Occupational Therapy Treatment Patient Details Name: Paula Murphy MRN: [id number] DOB: 21-Jan-1946 Today's Date: 10/25/2024   History of present illness 78 yo F adm 10/19/24 with R facial droop and R side weakness. MRI(+) L basal ganglia CVA. NIH 0 PMH: HLD, macular degeneration, nephrolithiasis, hypothyroidism, HTN, fibromyalgia, LTKA   OT comments  Pt seen this afternoon for OT treatment. Pt eager to participate with OT. Focus of session on building overall activity tolerance and practicing tub shower transfer. Pt admitting to OT today that she has noticed some memory troubles since her stroke (ie: forgetting which bills she needs to pay while hospitalized). Added an additional goal to reflect pt's concerns. Overall, she was no more than CGA for ambulation community distance via RW. Demo's Parkinsonian-like features - bradykinesia (worsens when distracted), shuffling, difficulty with turning and navigating obstacles, flat affect, etc. Responds well to visual cues and verbal big steps. Required few rest breaks during session. CGA for tub/shower transfer and grossly SUP/CGA for all ADLs. Current recs for high-intensity rehab remain, although would recommend OPOT should pt not be accepted for post-acute inpatient rehab. OT to continue to follow.      If plan is discharge home, recommend the following:  A little help with walking and/or transfers;A little help with bathing/dressing/bathroom;Assistance with cooking/housework;Assist for transportation;Help with stairs or ramp for entrance;Direct supervision/assist for financial management   Equipment Recommendations  None recommended by OT    Recommendations for Other Services      Precautions / Restrictions Precautions Precautions: Fall Recall of Precautions/Restrictions: Intact Restrictions Weight Bearing Restrictions Per Provider Order: No       Mobility Bed Mobility Overal bed mobility: Modified Independent              General bed mobility comments: exited to the L    Transfers Overall transfer level: Needs assistance Equipment used: Rolling walker (2 wheels) Transfers: Sit to/from Stand Sit to Stand: Supervision           General transfer comment: VC for hand placement relative to RW     Balance Overall balance assessment: Needs assistance Sitting-balance support: No upper extremity supported, Feet supported Sitting balance-Leahy Scale: Fair Sitting balance - Comments: EOB   Standing balance support: Bilateral upper extremity supported, During functional activity, Reliant on assistive device for balance Standing balance-Leahy Scale: Poor Standing balance comment: reliant on RW, cued to maintain foot proximity within frame of RW                           ADL either performed or assessed with clinical judgement   ADL Overall ADL's : Needs assistance/impaired Eating/Feeding: Independent               Upper Body Dressing : Minimal assistance;Standing Upper Body Dressing Details (indicate cue type and reason): donned second gown around back, assist to fully thread UE's through arm holes             Tub/ Shower Transfer: Contact guard assist;Ambulation;Rolling walker (2 wheels) Tub/Shower Transfer Details (indicate cue type and reason): guided in/out of tub with VC for safe hand placement, proper foot placement, and safe home setup (holding onto walls for stability to simulate home environment) Functional mobility during ADLs: Supervision/safety;Contact guard assist;Rolling walker (2 wheels)      Extremity/Trunk Assessment              Vision       Perception     Praxis  Communication Communication Communication: No apparent difficulties   Cognition Arousal: Alert Behavior During Therapy: WFL for tasks assessed/performed, Flat affect Cognition: No apparent impairments             OT - Cognition Comments: reduced problem solving, pt  self-reporting memory troubles                 Following commands: Intact        Cueing   Cueing Techniques: Verbal cues  Exercises      Shoulder Instructions       General Comments friend Edris) at bedside    Pertinent Vitals/ Pain       Pain Assessment Pain Assessment: No/denies pain  Home Living                                          Prior Functioning/Environment              Frequency  Min 2X/week        Progress Toward Goals  OT Goals(current goals can now be found in the care plan section)  Progress towards OT goals: Progressing toward goals     Plan      Co-evaluation                 AM-PAC OT 6 Clicks Daily Activity     Outcome Measure   Help from another person eating meals?: None Help from another person taking care of personal grooming?: A Little Help from another person toileting, which includes using toliet, bedpan, or urinal?: A Little Help from another person bathing (including washing, rinsing, drying)?: A Little Help from another person to put on and taking off regular upper body clothing?: A Little Help from another person to put on and taking off regular lower body clothing?: A Little 6 Click Score: 19    End of Session Equipment Utilized During Treatment: Gait belt;Rolling walker (2 wheels)  OT Visit Diagnosis: Unsteadiness on feet (R26.81);Other abnormalities of gait and mobility (R26.89);Muscle weakness (generalized) (M62.81)   Activity Tolerance Patient tolerated treatment well   Patient Left in bed;with call bell/phone within reach;with family/visitor present   Nurse Communication Mobility status;Other (comment) (pt status)        Time: 8352-8287 OT Time Calculation (min): 25 min  Charges: OT General Charges $OT Visit: 1 Visit OT Treatments $Therapeutic Activity: 23-37 mins  Ashantee Deupree M. [place], OTR/L [facility] [phone] Secure Chat  Preferred  Larraine Argo 10/25/2024, 5:36 PM

## 2024-10-26 DIAGNOSIS — I639 Cerebral infarction, unspecified: Secondary | ICD-10-CM | POA: Diagnosis not present

## 2024-10-26 DIAGNOSIS — E039 Hypothyroidism, unspecified: Secondary | ICD-10-CM | POA: Diagnosis not present

## 2024-10-26 MED ORDER — POLYETHYLENE GLYCOL 3350 17 G PO PACK
17.0000 g | PACK | Freq: Every day | ORAL | Status: DC
  Administered 2024-10-26: 17 g via ORAL
  Filled 2024-10-26 (×2): qty 1

## 2024-10-26 MED ORDER — BISACODYL 5 MG PO TBEC
5.0000 mg | DELAYED_RELEASE_TABLET | Freq: Every day | ORAL | Status: DC | PRN
  Administered 2024-10-26: 5 mg via ORAL
  Filled 2024-10-26: qty 1

## 2024-10-26 NOTE — Progress Notes (Signed)
 "  PROGRESS NOTE    LETICIA COLETTA  FMW:[id number] DOB: 03/15/1946 DOA: 10/19/2024 PCP: Nichole Senior, MD   Brief Narrative: SAE HANDRICH is a 78 y.o. female with a history of osteoarthritis, fibromyalgia, hyperlipidemia, hypothyroidism, degenerative disc disease, depression, nephrolithiasis.  Patient presented secondary to right-sided weakness with initial concern for TIA versus stroke.  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  Patient started on dual antiplatelet therapy with aspirin  and Plavix .  Plan for discharge to acute inpatient rehab.   Assessment and Plan:  Acute stroke Patient presented with symptoms including right-sided weakness concerning for stroke/TIA.  CT of the head and neck obtained and was significant for no acute intracranial abnormality and no large vessel occlusion or hemodynamically significant stenosis at the head or neck.  MRI brain (12/15) confirms a small acute ischemic nonhemorrhagic infarct in the left basal ganglia. LDL of 85. Hemoglobin A1C of 5.3%. Transthoracic Echocardiogram significant for no atrial level shunt. Neurology recommendations for Plavix  for 3 weeks in addition to indefinite aspirin . PT/OT recommendations for CIR. - Continue Plavix , aspirin  and Zetia   Fibromyalgia Prior to arrival medication(s) include Robaxin , ibuprofen , tramadol  - Continue tramadol  as needed  Hypothyroidism Prior to arrival medication(s) include levothyroxine . - Continue levothyroxine   GERD Prior to arrival medication(s) include Protonix . - Continue Protonix   Hyperlipidemia Prior to arrival medication(s) include Zetia .  Patient has an intolerance to statin therapy.  Neurology recommendation for continuing Zetia  in addition to considering Repatha as an outpatient  Depression Anxiety Prior to arrival medication(s) include alprazolam  and Lexapro .  Lexapro  restarted on admission. - Continue Lexapro   Nausea/vomiting Patient with multiple episodes of emesis on  12/17, possibly related to food she ate. She reports symptoms have now resolved. - Continue Zofran  and Compazine  as needed   DVT prophylaxis: Lovenox  Code Status:   Code Status: Full Code Family Communication: None at bedside Disposition Plan: Plan to discharge to acute inpatient rehab pending bed/insurance authorization   Consultants:  Neurology  Procedures:  Transthoracic echocardiogram  Antimicrobials: None   Subjective: No recent bowel movement. Otherwise, no concerns.  Objective: BP 133/67 (BP Location: Right Arm)   Pulse 66   Temp 97.9 F (36.6 C) (Oral)   Resp 15   Ht 5' (1.524 m)   Wt 66.2 kg   SpO2 97%   BMI 28.51 kg/m   Examination:  General exam: Appears calm and comfortable. Ambulating in the hall with therapy. Respiratory system: Respiratory effort normal. Central nervous system: Alert and oriented. Psychiatry: Judgement and insight appear normal. Mood & affect appropriate.    Data Reviewed: I have personally reviewed following labs and imaging studies  CBC Lab Results  Component Value Date   WBC 6.2 10/20/2024   RBC 3.81 (L) 10/20/2024   HGB 12.1 10/20/2024   HCT 37.1 10/20/2024   MCV 97.4 10/20/2024   MCH 31.8 10/20/2024   PLT 188 10/20/2024   MCHC 32.6 10/20/2024   RDW 14.1 10/20/2024   LYMPHSABS 2.0 10/19/2024   MONOABS 0.4 10/19/2024   EOSABS 0.2 10/19/2024   BASOSABS 0.0 10/19/2024     Last metabolic panel Lab Results  Component Value Date   NA 140 10/22/2024   K 3.7 10/22/2024   CL 111 10/22/2024   CO2 23 10/22/2024   BUN 9 10/22/2024   CREATININE 0.59 10/22/2024   GLUCOSE 91 10/22/2024   GFRNONAA >60 10/22/2024   GFRAA 85 11/30/2020   CALCIUM 8.2 (L) 10/22/2024   PROT 6.7 10/19/2024   ALBUMIN 3.8 10/19/2024  BILITOT 0.3 10/19/2024   ALKPHOS 56 10/19/2024   AST 18 10/19/2024   ALT 10 10/19/2024   ANIONGAP 6 10/22/2024    GFR: Estimated Creatinine Clearance: 50 mL/min (by C-G formula based on SCr of 0.59  mg/dL).  No results found for this or any previous visit (from the past 240 hours).    Radiology Studies: No results found.     LOS: 5 days    Elgin Lam, MD [facility] 10/26/2024, 10:57 AM   If 7PM-7AM, please contact night-coverage [email or web address]  "

## 2024-10-26 NOTE — Plan of Care (Signed)
  Problem: Education: Goal: Knowledge of disease or condition will improve Outcome: Progressing   Problem: Education: Goal: Knowledge of patient specific risk factors will improve (DELETE if not current risk factor) Outcome: Progressing   Problem: Coping: Goal: Will verbalize positive feelings about self Outcome: Progressing

## 2024-10-26 NOTE — TOC Progression Note (Signed)
 Transition of Care [facility]) - Progression Note    Patient Details  Name: Paula Murphy MRN: [id number] Date of Birth: [birth year]  Transition of [facility] CM/SW Contact  Andrez JULIANNA George, RN Phone Number: 10/26/2024, 3:03 PM  Clinical Narrative:     Pt is appealing denial from CIR. IP Care management following.  Expected Discharge Plan: IP Rehab Facility                 Expected Discharge Plan and Services                                               Social Drivers of Health (SDOH) Interventions SDOH Screenings   Food Insecurity: No Food Insecurity (10/20/2024)  Housing: Low Risk (10/20/2024)  Transportation Needs: No Transportation Needs (10/20/2024)  Utilities: Not At Risk (10/20/2024)  Social Connections: Moderately Integrated (10/20/2024)  Tobacco Use: Low Risk (10/20/2024)    Readmission Risk Interventions     No data to display

## 2024-10-26 NOTE — Progress Notes (Signed)
 Inpatient Rehab Admissions Coordinator:  Awaiting insurance approval. Will continue to follow.    Wolfgang Phoenix, MS, CCC-SLP Admissions Coordinator [phone]

## 2024-10-26 NOTE — Progress Notes (Signed)
 Physical Therapy Treatment Patient Details Name: Paula Murphy MRN: [id number] DOB: [birth year] Today's Date: 10/26/2024   History of Present Illness 78 yo F adm 10/19/24 with R facial droop and R side weakness. MRI(+) L basal ganglia CVA. NIH 0 PMH: HLD, macular degeneration, nephrolithiasis, hypothyroidism, HTN, fibromyalgia, LTKA    PT Comments  Patient new to this PT and stated she does not want to use RW when she goes home. PT agreed to trial of ambulation without device. Patient with very short, shuffling steps with repeated LLE dragging. Twice her left foot caught on the floor and pt began falling forward and to her left with inability to self-correct and fall prevented with mod assist of PT. After seated rest and donning pt's sneakers, pt agreed to ambulate with RW. Required up to min assist when turning and backing up to surfaces due to small, stuttering steps and catching of left foot. With RW, pt able to lengthen steps and perform rt heelstrike (continued to land with left foot flat) with cues, however would revert to her prior habits and required overall max cues to maintain improved gait dynamics. Patient would benefit from post-acute inpatient therapies >3 hrs/day to improve safety with gait and knowledge of use of DME.     If plan is discharge home, recommend the following: A little help with walking and/or transfers;A little help with bathing/dressing/bathroom;Assistance with cooking/housework;Assist for transportation   Can travel by private vehicle        Equipment Recommendations  None recommended by PT (Pt already has needed DME)    Recommendations for Other Services       Precautions / Restrictions Precautions Precautions: Fall Recall of Precautions/Restrictions: Intact Restrictions Weight Bearing Restrictions Per Provider Order: No     Mobility  Bed Mobility Overal bed mobility: Modified Independent             General bed mobility comments: exited to the  L    Transfers Overall transfer level: Needs assistance Equipment used: Rolling walker (2 wheels) Transfers: Sit to/from Stand Sit to Stand: Mod assist           General transfer comment: VC for hand placement and safe use of RW; mod assist to recover balance as turning to sit down    Ambulation/Gait Ambulation/Gait assistance: Min assist, Mod assist Gait Distance (Feet): 100 Feet (seated rest, 50 standing rest, 50 (all without RW); 100 with RW) Assistive device: Rolling walker (2 wheels), None Gait Pattern/deviations: Step-to pattern, Decreased step length - right, Decreased step length - left, Decreased stride length, Decreased dorsiflexion - left, Shuffle, Narrow base of support Gait velocity: decreased     General Gait Details: Pt took short slow steps with minimal to no foot clearence bilat. Occasional dragging of Lt foot and twice with Lt foot catching and tripping pt requiring mod assist to recover/prevent fall. With RW needs max cues for proximity to RW, assist with balance when turning with RW. Patient is able to incr step length and demonstrate heelstrike on RLE (not LLE) with cues, however reverts back to her baseline within 5-10 ft and requires repeat cues.   Stairs Stairs: Yes Stairs assistance: Contact guard assist Stair Management: Two rails, Forwards, Step to pattern Number of Stairs: 4 General stair comments: for strengthening; 6-7 step height   Wheelchair Mobility     Tilt Bed    Modified Rankin (Stroke Patients Only) Modified Rankin (Stroke Patients Only) Pre-Morbid Rankin Score: No significant disability Modified Rankin: Moderately severe disability  Balance Overall balance assessment: Needs assistance Sitting-balance support: No upper extremity supported, Feet supported Sitting balance-Leahy Scale: Fair Sitting balance - Comments: EOB   Standing balance support: Bilateral upper extremity supported, During functional activity, Reliant on  assistive device for balance Standing balance-Leahy Scale: Poor Standing balance comment: reliant on RW, cued to maintain foot proximity within frame of RW                 Standardized Balance Assessment Standardized Balance Assessment : Berg Balance Test Berg Balance Test Sit to Stand: Able to stand using hands after several tries Standing Unsupported: Unable to stand 30 seconds unassisted Sitting with Back Unsupported but Feet Supported on Floor or Stool: Able to sit 2 minutes under supervision Stand to Sit: Needs assistance to sit Transfers: Needs one person to assist Turn 360 Degrees: Needs assistance while turning Standing Unsupported, Alternately Place Feet on Step/Stool: Needs assistance to keep from falling or unable to try        Communication Communication Communication: No apparent difficulties  Cognition Arousal: Alert Behavior During Therapy: WFL for tasks assessed/performed, Flat affect   PT - Cognitive impairments: Awareness                       PT - Cognition Comments: Pt A,Ox4. She appears to have delayed processing and decr awareness of deficits/fall risk Following commands: Intact      Cueing Cueing Techniques: Verbal cues  Exercises      General Comments        Pertinent Vitals/Pain Pain Assessment Pain Assessment: No/denies pain    Home Living                          Prior Function            PT Goals (current goals can now be found in the care plan section) Acute Rehab PT Goals Patient Stated Goal: Regain indepence to safely return home after IPR Progress towards PT goals: Progressing toward goals    Frequency    Min 3X/week      PT Plan      Co-evaluation              AM-PAC PT 6 Clicks Mobility   Outcome Measure  Help needed turning from your back to your side while in a flat bed without using bedrails?: None Help needed moving from lying on your back to sitting on the side of a flat bed  without using bedrails?: A Little Help needed moving to and from a bed to a chair (including a wheelchair)?: A Lot Help needed standing up from a chair using your arms (e.g., wheelchair or bedside chair)?: A Little Help needed to walk in hospital room?: A Lot Help needed climbing 3-5 steps with a railing? : A Little 6 Click Score: 17    End of Session Equipment Utilized During Treatment: Gait belt Activity Tolerance: Patient limited by fatigue Patient left: in bed;with call bell/phone within reach;with bed alarm set Nurse Communication: Mobility status;Other (comment) (near falls without RW) PT Visit Diagnosis: Unsteadiness on feet (R26.81);Difficulty in walking, not elsewhere classified (R26.2);Pain     Time: 8983-8950 PT Time Calculation (min) (ACUTE ONLY): 33 min  Charges:    $Gait Training: 23-37 mins PT General Charges $$ ACUTE PT VISIT: 1 Visit                      Macario  GORMAN, PT Acute Rehabilitation Services  Office [phone]    Macario SHAUNNA Soja 10/26/2024, 11:07 AM

## 2024-10-26 NOTE — Progress Notes (Signed)
 Inpatient Rehab Admissions Coordinator:  Insurance denied. Pt, family and friend Arley made aware. Pt would like to appeal. Will continue to follow.   Tinnie Yvone Cohens, MS, CCC-SLP Admissions Coordinator [phone]

## 2024-10-27 DIAGNOSIS — E039 Hypothyroidism, unspecified: Secondary | ICD-10-CM | POA: Diagnosis not present

## 2024-10-27 DIAGNOSIS — I639 Cerebral infarction, unspecified: Secondary | ICD-10-CM | POA: Diagnosis not present

## 2024-10-27 MED ORDER — POLYETHYLENE GLYCOL 3350 17 G PO PACK: 17.0000 g | PACK | Freq: Every day | ORAL | Status: DC | PRN

## 2024-10-27 NOTE — Progress Notes (Signed)
" ° °  Inpatient Rehabilitation Admissions Coordinator   I met with patient at bedside.SABRA She will await until Wed on the appeal . If not approved, will want OP Neuro rehab.  Heron Leavell, RN, MSN Rehab Admissions Coordinator [phone] 10/27/2024 10:58 AM  "

## 2024-10-27 NOTE — Plan of Care (Signed)
" °  Problem: Self-Care: Goal: Ability to participate in self-care as condition permits will improve Outcome: Progressing Goal: Verbalization of feelings and concerns over difficulty with self-care will improve Outcome: Progressing Goal: Ability to communicate needs accurately will improve Outcome: Progressing   Problem: Nutrition: Goal: Risk of aspiration will decrease Outcome: Progressing Goal: Dietary intake will improve Outcome: Progressing   Problem: Coping: Goal: Will verbalize positive feelings about self Outcome: Progressing Goal: Will identify appropriate support needs Outcome: Progressing   Problem: Clinical Measurements: Goal: Ability to maintain clinical measurements within normal limits will improve Outcome: Progressing Goal: Will remain free from infection Outcome: Progressing Goal: Diagnostic test results will improve Outcome: Progressing Goal: Respiratory complications will improve Outcome: Progressing Goal: Cardiovascular complication will be avoided Outcome: Progressing   Problem: Activity: Goal: Risk for activity intolerance will decrease Outcome: Progressing   "

## 2024-10-27 NOTE — Progress Notes (Signed)
 Physical Therapy Treatment Patient Details Name: Paula Murphy MRN: [id number] DOB: Mar 17, 1946 Today's Date: 10/27/2024   History of Present Illness 78 yo F adm 10/19/24 with R facial droop and R side weakness. MRI(+) L basal ganglia CVA. NIH 0 PMH: HLD, macular degeneration, nephrolithiasis, hypothyroidism, HTN, fibromyalgia, LTKA    PT Comments  Patient reports she slept better last night and feels much better. Donned shoes prior to activity. Patient requires cues for hand placement/safe use of RW during transfer to stand x3 reps. Marching with UE support via RW with excellent foot clearance bil. Ambulated 180 ft with RW with min assist (turns and backing up) with mod cues to maintain longer step length and heelstrike (for foot clearance). Educated to do mini-marches when turning with RW to prevent Lt foot from catching/dragging/tripping her. Standing balance improved as evidenced by sections of Berg Balance Assessment which were completed. Pt to/from bathroom during session.    If plan is discharge home, recommend the following: A little help with walking and/or transfers;A little help with bathing/dressing/bathroom;Assistance with cooking/housework;Assist for transportation   Can travel by private vehicle        Equipment Recommendations  None recommended by PT (Pt already has needed DME)    Recommendations for Other Services       Precautions / Restrictions Precautions Precautions: Fall Recall of Precautions/Restrictions: Intact Restrictions Weight Bearing Restrictions Per Provider Order: No     Mobility  Bed Mobility Overal bed mobility: Modified Independent             General bed mobility comments: exited to the L with HOB elevated    Transfers Overall transfer level: Needs assistance Equipment used: Rolling walker (2 wheels) Transfers: Sit to/from Stand Sit to Stand: Contact guard assist           General transfer comment: VC for hand placement and  safe use of RW; vc for marching steps as turning to keep Left foot from dragging/catching (required cues with each turn)    Ambulation/Gait Ambulation/Gait assistance: Min assist Gait Distance (Feet): 180 Feet Assistive device: Rolling walker (2 wheels) Gait Pattern/deviations: Decreased step length - right, Decreased step length - left, Decreased dorsiflexion - left, Shuffle, Narrow base of support, Step-through pattern, Drifts right/left Gait velocity: decreased     General Gait Details: better carryover of longer strides, emphasis on heelstrike (mod cues to maintain); ambulation backwards with RW with min assist x78ft; vc for marching steps during turns to improve left foot clearan   Stairs             Wheelchair Mobility     Tilt Bed    Modified Rankin (Stroke Patients Only) Modified Rankin (Stroke Patients Only) Pre-Morbid Rankin Score: No significant disability Modified Rankin: Moderately severe disability     Balance Overall balance assessment: Needs assistance Sitting-balance support: No upper extremity supported, Feet supported Sitting balance-Leahy Scale: Fair Sitting balance - Comments: EOB   Standing balance support: Bilateral upper extremity supported, During functional activity, Reliant on assistive device for balance Standing balance-Leahy Scale: Poor Standing balance comment: able to stand without UE support with CGA                 Standardized Balance Assessment Standardized Balance Assessment : Berg Balance Test Berg Balance Test Sit to Stand: Able to stand using hands after several tries Standing Unsupported: Able to stand 2 minutes with supervision Sitting with Back Unsupported but Feet Supported on Floor or Stool: Able to sit 2 minutes under  supervision Stand to Sit: Uses backs of legs against chair to control descent Transfers: Needs one person to assist Standing Unsupported with Eyes Closed: Able to stand 10 seconds with  supervision Standing Ubsupported with Feet Together: Able to place feet together independently and stand for 1 minute with supervision From Standing, Reach Forward with Outstretched Arm: Can reach forward >5 cm safely (2) From Standing Position, Turn to Look Behind Over each Shoulder: Needs supervision when turning Turn 360 Degrees: Needs close supervision or verbal cueing        Communication Communication Communication: No apparent difficulties  Cognition Arousal: Alert Behavior During Therapy: WFL for tasks assessed/performed, Flat affect   PT - Cognitive impairments: Awareness                       PT - Cognition Comments: Pt A,Ox4. She has delayed processing and decr awareness of deficits/fall risk Following commands: Intact      Cueing Cueing Techniques: Verbal cues  Exercises General Exercises - Lower Extremity Hip Flexion/Marching: AROM, Both, 10 reps, Standing (with RW/UE support)    General Comments General comments (skin integrity, edema, etc.): Assisted to/from bathroom. Pt tried to park RW outside bathroom and required cues to take it with her all the way      Pertinent Vitals/Pain Pain Assessment Pain Assessment: Faces Faces Pain Scale: Hurts a little bit Pain Location: bil hip bursae Pain Descriptors / Indicators: Constant, Discomfort Pain Intervention(s): Limited activity within patient's tolerance, Monitored during session    Home Living                          Prior Function            PT Goals (current goals can now be found in the care plan section) Acute Rehab PT Goals Patient Stated Goal: Regain indepence to safely return home after IPR Progress towards PT goals: Progressing toward goals    Frequency    Min 3X/week      PT Plan      Co-evaluation              AM-PAC PT 6 Clicks Mobility   Outcome Measure  Help needed turning from your back to your side while in a flat bed without using bedrails?:  None Help needed moving from lying on your back to sitting on the side of a flat bed without using bedrails?: None Help needed moving to and from a bed to a chair (including a wheelchair)?: A Little Help needed standing up from a chair using your arms (e.g., wheelchair or bedside chair)?: A Little Help needed to walk in hospital room?: A Little Help needed climbing 3-5 steps with a railing? : A Little 6 Click Score: 20    End of Session Equipment Utilized During Treatment: Gait belt Activity Tolerance: Patient tolerated treatment well Patient left: in bed;with call bell/phone within reach;with bed alarm set Nurse Communication: Other (comment) (had BM) PT Visit Diagnosis: Unsteadiness on feet (R26.81);Difficulty in walking, not elsewhere classified (R26.2);Pain     Time: 9094-9056 PT Time Calculation (min) (ACUTE ONLY): 38 min  Charges:    $Gait Training: 23-37 mins (bathroom time non-billable) PT General Charges $$ ACUTE PT VISIT: 1 Visit                      Macario RAMAN, PT Acute Rehabilitation Services  Office [phone]    Macario SHAUNNA Soja 10/27/2024,  9:52 AM

## 2024-10-27 NOTE — Progress Notes (Signed)
 "  PROGRESS NOTE    [name]  FMW:[id number] DOB: Jan 14, 1946 DOA: 10/19/2024 PCP: Nichole Senior, MD   Brief Narrative: Paula Murphy is a 78 y.o. female with a history of osteoarthritis, fibromyalgia, hyperlipidemia, hypothyroidism, degenerative disc disease, depression, nephrolithiasis.  Patient presented secondary to right-sided weakness with initial concern for TIA versus stroke.  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  Patient started on dual antiplatelet therapy with aspirin  and Plavix .  Plan for discharge to acute inpatient rehab.   Assessment and Plan:  Acute stroke Patient presented with symptoms including right-sided weakness concerning for stroke/TIA.  CT of the head and neck obtained and was significant for no acute intracranial abnormality and no large vessel occlusion or hemodynamically significant stenosis at the head or neck.  MRI brain (12/15) confirms a small acute ischemic nonhemorrhagic infarct in the left basal ganglia. LDL of 85. Hemoglobin A1C of 5.3%. Transthoracic Echocardiogram significant for no atrial level shunt. Neurology recommendations for Plavix  for 3 weeks in addition to indefinite aspirin . PT/OT recommendations for CIR. - Continue Plavix , aspirin  and Zetia   Fibromyalgia Prior to arrival medication(s) include Robaxin , ibuprofen , tramadol  - Continue tramadol  as needed  Hypothyroidism Prior to arrival medication(s) include levothyroxine . - Continue levothyroxine   GERD Prior to arrival medication(s) include Protonix . - Continue Protonix   Hyperlipidemia Prior to arrival medication(s) include Zetia .  Patient has an intolerance to statin therapy.  Neurology recommendation for continuing Zetia  in addition to considering Repatha as an outpatient  Depression Anxiety Prior to arrival medication(s) include alprazolam  and Lexapro .  Lexapro  restarted on admission. - Continue Lexapro   Nausea/vomiting Patient with multiple episodes of emesis on  12/17, possibly related to food she ate. She reports symptoms have now resolved. - Continue Zofran  and Compazine  as needed  Constipation - Continue Senokot-S and Dulcolax as needed   DVT prophylaxis: Lovenox  Code Status:   Code Status: Full Code Family Communication: None at bedside Disposition Plan: Plan to discharge to acute inpatient rehab. Insurance denial received; appeal pursued on 12/22.   Consultants:  Neurology  Procedures:  Transthoracic echocardiogram  Antimicrobials: None   Subjective: No concerns this morning. Finally had a bowel movement.  Objective: BP 126/72 (BP Location: Right Arm)   Pulse 76   Temp 98.7 F (37.1 C) (Oral)   Resp 16   Ht 5' (1.524 m)   Wt 66.2 kg   SpO2 98%   BMI 28.51 kg/m   Examination:  General exam: Appears calm and comfortable. Ambulating in the hall with therapy. Respiratory system: Respiratory effort normal. Central nervous system: Alert and oriented. Psychiatry: Judgement and insight appear normal. Mood & affect appropriate.    Data Reviewed: I have personally reviewed following labs and imaging studies  CBC Lab Results  Component Value Date   WBC 6.2 10/20/2024   RBC 3.81 (L) 10/20/2024   HGB 12.1 10/20/2024   HCT 37.1 10/20/2024   MCV 97.4 10/20/2024   MCH 31.8 10/20/2024   PLT 188 10/20/2024   MCHC 32.6 10/20/2024   RDW 14.1 10/20/2024   LYMPHSABS 2.0 10/19/2024   MONOABS 0.4 10/19/2024   EOSABS 0.2 10/19/2024   BASOSABS 0.0 10/19/2024     Last metabolic panel Lab Results  Component Value Date   NA 140 10/22/2024   K 3.7 10/22/2024   CL 111 10/22/2024   CO2 23 10/22/2024   BUN 9 10/22/2024   CREATININE 0.59 10/22/2024   GLUCOSE 91 10/22/2024   GFRNONAA >60 10/22/2024   GFRAA 85 11/30/2020  CALCIUM 8.2 (L) 10/22/2024   PROT 6.7 10/19/2024   ALBUMIN 3.8 10/19/2024   BILITOT 0.3 10/19/2024   ALKPHOS 56 10/19/2024   AST 18 10/19/2024   ALT 10 10/19/2024   ANIONGAP 6 10/22/2024     GFR: Estimated Creatinine Clearance: 50 mL/min (by C-G formula based on SCr of 0.59 mg/dL).  No results found for this or any previous visit (from the past 240 hours).    Radiology Studies: No results found.     LOS: 6 days    Elgin Lam, MD [facility] 10/27/2024, 11:24 AM   If 7PM-7AM, please contact night-coverage [email or web address]  "

## 2024-10-28 DIAGNOSIS — G459 Transient cerebral ischemic attack, unspecified: Secondary | ICD-10-CM | POA: Diagnosis not present

## 2024-10-28 DIAGNOSIS — R531 Weakness: Secondary | ICD-10-CM | POA: Diagnosis not present

## 2024-10-28 DIAGNOSIS — I639 Cerebral infarction, unspecified: Secondary | ICD-10-CM | POA: Diagnosis not present

## 2024-10-28 MED ORDER — ASPIRIN 81 MG PO TBEC: 81.0000 mg | DELAYED_RELEASE_TABLET | Freq: Every day | ORAL | 12 refills | Status: AC

## 2024-10-28 MED ORDER — CLOPIDOGREL BISULFATE 75 MG PO TABS: 75.0000 mg | ORAL_TABLET | Freq: Every day | ORAL | 0 refills | Status: AC

## 2024-10-28 NOTE — Discharge Summary (Signed)
 " Physician Discharge Summary   Patient: Paula Murphy MRN: [id number] DOB: [birth year]  Admit date:     10/19/2024  Discharge date: 10/28/2024  Discharge Physician: Elgie Butter   PCP: Nichole Senior, MD   Recommendations at discharge:  Please follow up with PCP in once week.  Please follow up with cbc and bmp in one week.  Please follow up with neurology as recommended.   Discharge Diagnoses: Principal Problem:   TIA (transient ischemic attack) Active Problems:   Mixed hyperlipidemia   Infarction of left basal ganglia (HCC)   Right sided weakness   Acute ischemic stroke [facility])   Hospital Course: Paula Murphy is a 78 y.o. female with a history of osteoarthritis, fibromyalgia, hyperlipidemia, hypothyroidism, degenerative disc disease, depression, nephrolithiasis.  Patient presented secondary to right-sided weakness with initial concern for TIA versus stroke.  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  Patient started on dual antiplatelet therapy with aspirin  and Plavix .   Assessment and Plan:    Acute stroke Patient presented with symptoms including right-sided weakness concerning for stroke/TIA.  CT of the head and neck obtained and was significant for no acute intracranial abnormality and no large vessel occlusion or hemodynamically significant stenosis at the head or neck.  MRI brain (12/15) confirms a small acute ischemic nonhemorrhagic infarct in the left basal ganglia. LDL of 85. Hemoglobin A1C of 5.3%. Transthoracic Echocardiogram significant for no atrial level shunt. Neurology recommendations for Plavix  for 3 weeks in addition to indefinite aspirin . PT/OT recommendations for CIR. - Continue Plavix , aspirin  and Zetia    Fibromyalgia Prior to arrival medication(s) include Robaxin ,.  - Continue tramadol  as needed   Hypothyroidism Prior to arrival medication(s) include levothyroxine . - Continue levothyroxine    GERD Prior to arrival medication(s) include Protonix . -  Continue Protonix    Hyperlipidemia Prior to arrival medication(s) include Zetia .  Patient has an intolerance to statin therapy.  Neurology recommendation for continuing Zetia  in addition to considering Repatha as an outpatient   Depression Anxiety Prior to arrival medication(s) include alprazolam  and Lexapro .  Lexapro  restarted on admission. - Continue Lexapro    Nausea/vomiting Patient with multiple episodes of emesis on 12/17, possibly related to food she ate. She reports symptoms have now resolved. - Continue Zofran  and Compazine  as needed   Constipation - Continue Senokot-S and Dulcolax as needed    Consultants: neurology Procedures performed: MRI brain  Disposition: Home Diet recommendation:  Cardiac diet DISCHARGE MEDICATION: Allergies as of 10/28/2024       Reactions   Codeine Nausea And Vomiting   NAUSEA & VOMITING   Hydrocodone Nausea And Vomiting   Plaquenil  [hydroxychloroquine ]    Vision changes per patient         Medication List     STOP taking these medications    ibuprofen  800 MG tablet Commonly known as: ADVIL    prochlorperazine  5 MG tablet Commonly known as: COMPAZINE        TAKE these medications    ALPRAZolam  0.5 MG 24 hr tablet Commonly known as: XANAX  XR Take 0.5 mg by mouth daily.   aspirin  EC 81 MG tablet Take 1 tablet (81 mg total) by mouth daily. Swallow whole. Start taking on: October 29, 2024   clopidogrel  75 MG tablet Commonly known as: PLAVIX  Take 1 tablet (75 mg total) by mouth daily for 12 days. Start taking on: October 29, 2024   escitalopram  10 MG tablet Commonly known as: LEXAPRO  Take 10 mg by mouth daily.   ezetimibe  10 MG tablet Commonly  known as: ZETIA  Take 10 mg by mouth daily.   fluticasone 50 MCG/ACT nasal spray Commonly known as: FLONASE Place 1 spray into both nostrils daily as needed for allergies.   Gemtesa 75 MG Tabs Generic drug: Vibegron Take 1 tablet by mouth daily.   levothyroxine  150  MCG tablet Commonly known as: SYNTHROID  Take 150 mcg by mouth daily before breakfast.   loperamide  2 MG capsule Commonly known as: IMODIUM  Take 1 capsule (2 mg total) by mouth 4 (four) times daily as needed for diarrhea or loose stools.   methocarbamol  500 MG tablet Commonly known as: ROBAXIN  Take 500 mg by mouth at bedtime as needed for muscle spasms.   ondansetron  4 MG disintegrating tablet Commonly known as: ZOFRAN -ODT Take 1 tablet (4 mg total) by mouth every 8 (eight) hours as needed.   pantoprazole  40 MG tablet Commonly known as: PROTONIX  Take 40 mg by mouth daily.   PreserVision AREDS Caps Take 1 capsule by mouth daily.   Prolia  60 MG/ML Sosy injection Generic drug: denosumab  Inject 60 mg into the skin every 6 (six) months. CALL PT FOR PAYMENT INFORMATION. Courier to rheum: [address], [address], [place] [address]. Appt on 07/13/2024   topiramate  50 MG tablet Commonly known as: TOPAMAX  TAKE 3 TABLETS BY MOUTH DAILY What changed: when to take this   traMADol  50 MG tablet Commonly known as: ULTRAM  Take 50 mg by mouth 3 (three) times daily as needed for moderate pain (pain score 4-6).   valACYclovir  500 MG tablet Commonly known as: VALTREX  Take 500 mg by mouth daily as needed (fever blister).   Vitamin D3 125 MCG (5000 UT) Caps Take 5,000 Units by mouth daily.        Follow-up Information     [facility].   Contact information: [address] [place]  [id number] [phone]               Discharge Exam: Paula Murphy   10/19/24 1227  Weight: 66.2 kg   General exam: Appears calm and comfortable  Respiratory system: Clear to auscultation. Respiratory effort normal. Cardiovascular system: S1 & S2 heard, RRR.  Gastrointestinal system: Abdomen is nondistended, soft and nontender.  Central nervous system: Alert and oriented. No focal neurological deficits. Extremities: Symmetric 5 x 5 power. Skin:  No rashes,  Psychiatry: Mood & affect appropriate.    Condition at discharge: fair  The results of significant diagnostics from this hospitalization (including imaging, microbiology, ancillary and laboratory) are listed below for reference.   Imaging Studies: ECHOCARDIOGRAM COMPLETE Result Date: 10/20/2024    ECHOCARDIOGRAM REPORT   Patient Name:   Paula Murphy Merrick Date of Exam: 10/20/2024 Medical Rec #:  [id number]       Height:       60.0 in Accession #:    [id number]      Weight:       146.0 lb Date of Birth:  10-21-1946       BSA:          1.633 m Patient Age:    78 years        BP:           115/70 mmHg Patient Gender: F               HR:           69 bpm. Exam Location:  Inpatient Procedure: 2D Echo, Cardiac Doppler and Color Doppler (Both Spectral and Color  Flow Doppler were utilized during procedure). Indications:    Stroke 163.9  History:        Patient has no prior history of Echocardiogram examinations.                 Risk Factors:Dyslipidemia.  Sonographer:    Merlynn Argyle Referring Phys: [id number] PROSPER M AMPONSAH  Sonographer Comments: Image acquisition challenging due to respiratory motion. IMPRESSIONS  1. Left ventricular ejection fraction, by estimation, is 60 to 65%. The left ventricle has normal function. The left ventricle has no regional wall motion abnormalities. Left ventricular diastolic parameters are consistent with Grade I diastolic dysfunction (impaired relaxation).  2. Right ventricular systolic function is normal. The right ventricular size is normal. There is normal pulmonary artery systolic pressure. The estimated right ventricular systolic pressure is 23.4 mmHg.  3. The mitral valve is normal in structure. No evidence of mitral valve regurgitation. No evidence of mitral stenosis.  4. The aortic valve is tricuspid. Aortic valve regurgitation is not visualized. No aortic stenosis is present.  5. The inferior vena cava is normal in size with greater than 50%  respiratory variability, suggesting right atrial pressure of 3 mmHg. FINDINGS  Left Ventricle: Left ventricular ejection fraction, by estimation, is 60 to 65%. The left ventricle has normal function. The left ventricle has no regional wall motion abnormalities. The left ventricular internal cavity size was normal in size. There is  no left ventricular hypertrophy. Left ventricular diastolic parameters are consistent with Grade I diastolic dysfunction (impaired relaxation). Normal left ventricular filling pressure. Right Ventricle: The right ventricular size is normal. No increase in right ventricular wall thickness. Right ventricular systolic function is normal. There is normal pulmonary artery systolic pressure. The tricuspid regurgitant velocity is 2.26 m/s, and  with an assumed right atrial pressure of 3 mmHg, the estimated right ventricular systolic pressure is 23.4 mmHg. Left Atrium: Left atrial size was normal in size. Right Atrium: Right atrial size was normal in size. Pericardium: There is no evidence of pericardial effusion. Presence of epicardial fat layer. Mitral Valve: The mitral valve is normal in structure. No evidence of mitral valve regurgitation. No evidence of mitral valve stenosis. Tricuspid Valve: The tricuspid valve is normal in structure. Tricuspid valve regurgitation is not demonstrated. No evidence of tricuspid stenosis. Aortic Valve: The aortic valve is tricuspid. Aortic valve regurgitation is not visualized. No aortic stenosis is present. Pulmonic Valve: The pulmonic valve was not well visualized. Pulmonic valve regurgitation is not visualized. No evidence of pulmonic stenosis. Aorta: The aortic root and ascending aorta are structurally normal, with no evidence of dilitation. Venous: The inferior vena cava is normal in size with greater than 50% respiratory variability, suggesting right atrial pressure of 3 mmHg. IAS/Shunts: No atrial level shunt detected by color flow Doppler.  LEFT  VENTRICLE PLAX 2D LVIDd:         3.50 cm   Diastology LVIDs:         2.10 cm   LV e' medial:    11.00 cm/s LV PW:         1.20 cm   LV E/e' medial:  5.5 LV IVS:        1.20 cm   LV e' lateral:   8.08 cm/s LVOT diam:     1.90 cm   LV E/e' lateral: 7.5 LV SV:         54 LV SV Index:   33 LVOT Area:     2.84 cm LV IVRT:  77 msec  RIGHT VENTRICLE             IVC RV Basal diam:  3.60 cm     IVC diam: 1.60 cm RV S prime:     14.90 cm/s TAPSE (M-mode): 1.7 cm LEFT ATRIUM             Index        RIGHT ATRIUM           Index LA diam:        2.40 cm 1.47 cm/m   RA Area:     11.10 cm LA Vol (A2C):   25.7 ml 15.74 ml/m  RA Volume:   25.30 ml  15.49 ml/m LA Vol (A4C):   25.2 ml 15.43 ml/m LA Biplane Vol: 25.7 ml 15.74 ml/m  AORTIC VALVE             PULMONIC VALVE LVOT Vmax:   89.80 cm/s  RVOT Peak grad: 3 mmHg LVOT Vmean:  66.500 cm/s LVOT VTI:    0.189 m  AORTA Ao Root diam: 3.50 cm Ao Asc diam:  3.70 cm MITRAL VALVE                TRICUSPID VALVE MV Area (PHT): 5.54 cm     TR Peak grad:   20.4 mmHg MV Decel Time: 137 msec     TR Vmax:        226.00 cm/s MV E velocity: 60.20 cm/s MV A velocity: 103.00 cm/s  SHUNTS MV E/A ratio:  0.58         Systemic VTI:  0.19 m                             Systemic Diam: 1.90 cm                             Pulmonic VTI:  0.123 m Jerel Croitoru MD Electronically signed by Jerel Balding MD Signature Date/Time: 10/20/2024/11:17:00 AM    Final    MR BRAIN WO CONTRAST Result Date: 10/19/2024 EXAM: MRI BRAIN WITHOUT CONTRAST 10/19/2024 05:42:17 PM TECHNIQUE: Multiplanar multisequence MRI of the head/brain was performed without the administration of intravenous contrast. COMPARISON: Compared to the CT from earlier the same day. CLINICAL HISTORY: Headache, neuro deficit. FINDINGS: BRAIN AND VENTRICLES: Small 1.2 cm focus of restricted diffusion involving the left basal ganglia, consistent with a small acute ischemic perforator-type infarct (series 2, series 31). Patchy T2  hyperintensity involving the periventricular and deep white matter of both hemispheres, as well as the pons, consistent with chronic small vessel ischemic disease, moderate in nature. Single chronic microhemorrhage noted at the posterior left temporal lobe, of doubtful significance in isolation. No mass. No midline shift. No hydrocephalus. The sella is unremarkable. Normal flow voids. ORBITS: Prior bilateral ocular lens replacement. No acute abnormality. SINUSES AND MASTOIDS: No acute abnormality. BONES AND SOFT TISSUES: Normal marrow signal. No acute soft tissue abnormality. IMPRESSION: 1. 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 2. Moderate chronic small vessel ischemic disease involving the periventricular and deep white matter of both hemispheres and the pons. Electronically signed by: Morene Hoard MD 10/19/2024 06:49 PM EST RP Workstation: [id number]   CT ANGIO HEAD NECK W WO CM Result Date: 10/19/2024 EXAM: CT HEAD WITHOUT CTA HEAD AND NECK WITHOUT AND WITH 10/19/2024 02:42:52 PM TECHNIQUE: CTA of the head and neck was  performed without and with the administration of 75 mL of intravenous iohexol  (OMNIPAQUE ) 350 MG/ML injection. Noncontrast CT of the head with reconstructed 2-D images are also provided for review. Multiplanar 2D and/or 3D reformatted images are provided for review. Automated exposure control, iterative reconstruction, and/or weight based adjustment of the mA/kV was utilized to reduce the radiation dose to as low as reasonably achievable. COMPARISON: CTA head/neck 08/23/2024 CLINICAL HISTORY: Neuro deficit, acute, stroke suspected. FINDINGS: CT HEAD: BRAIN AND VENTRICLES: No acute intracranial hemorrhage. No mass effect. No midline shift. No extra-axial fluid collection. No evidence of acute infarct. No hydrocephalus. Stable background of moderate chronic small vessel disease and central pattern of volume loss. ORBITS: No acute  abnormality. SINUSES AND MASTOIDS: Chronic left sphenoid sinusitis. CTA NECK: AORTIC ARCH AND ARCH VESSELS: No dissection or arterial injury. No significant stenosis of the brachiocephalic or subclavian arteries. CERVICAL CAROTID ARTERIES: No dissection, arterial injury, or hemodynamically significant stenosis by NASCET criteria. CERVICAL VERTEBRAL ARTERIES: No dissection, arterial injury, or significant stenosis. LUNGS AND MEDIASTINUM: Unremarkable. SOFT TISSUES: No acute abnormality. BONES: Prior C5 to C7 ACDF. Hardware is intact. No associated lucency or fracture. No suspicious bone lesion. CTA HEAD: ANTERIOR CIRCULATION: No significant stenosis of the internal carotid arteries. No significant stenosis of the anterior cerebral arteries. No significant stenosis of the middle cerebral arteries. Conical outpouching from the communicating segment of the left ICA, favored to represent a posterior communicating artery infundibulum. POSTERIOR CIRCULATION: No significant stenosis of the posterior cerebral arteries. No significant stenosis of the basilar artery. No significant stenosis of the vertebral arteries. No aneurysm. OTHER: No dural venous sinus thrombosis on this non-dedicated study. IMPRESSION: 1. No acute intracranial abnormality. 2. No large vessel occlusion or hemodynamically significant stenosis in the head or neck. Electronically signed by: Ryan Chess MD 10/19/2024 02:54 PM EST RP Workstation: [id number]    Microbiology: Results for orders placed or performed during the hospital encounter of 08/23/24  Urine Culture     Status: Abnormal   Collection Time: 08/23/24  7:38 PM   Specimen: Urine, Clean Catch  Result Value Ref Range Status   Specimen Description   Final    URINE, CLEAN CATCH Performed at [facility], [address] [place], [place] [address]    Special Requests   Final    NONE Performed at [facility], [address] [place],  [place] [address]    Culture (A)  Final    >=100,000 COLONIES/mL KLEBSIELLA PNEUMONIAE suscpetibilties terminated organism would need to be sent out for further testing CALL MICROBIOLOGY LAB IF SENSITIVITIES ARE REQUIRED. [phone] Performed at [facility], [address], [place], [place] [address]    Report Status 08/27/2024 FINAL  Final    Labs: CBC: No results for input(s): WBC, NEUTROABS, HGB, HCT, MCV, PLT in the last 168 hours. Basic Metabolic Panel: Recent Labs  Lab 10/22/24 0836  NA 140  K 3.7  CL 111  CO2 23  GLUCOSE 91  BUN 9  CREATININE 0.59  CALCIUM 8.2*   Liver Function Tests: No results for input(s): AST, ALT, ALKPHOS, BILITOT, PROT, ALBUMIN in the last 168 hours. CBG: No results for input(s): GLUCAP in the last 168 hours.  Discharge time spent: 41 minutes  Signed: Elgie Butter, MD [facility] 10/28/2024 "

## 2024-10-28 NOTE — Plan of Care (Signed)
" °  Problem: Self-Care: Goal: Ability to participate in self-care as condition permits will improve Outcome: Progressing Goal: Verbalization of feelings and concerns over difficulty with self-care will improve Outcome: Progressing Goal: Ability to communicate needs accurately will improve Outcome: Progressing   Problem: Nutrition: Goal: Risk of aspiration will decrease Outcome: Progressing Goal: Dietary intake will improve Outcome: Progressing   Problem: Clinical Measurements: Goal: Ability to maintain clinical measurements within normal limits will improve Outcome: Progressing Goal: Will remain free from infection Outcome: Progressing Goal: Diagnostic test results will improve Outcome: Progressing Goal: Respiratory complications will improve Outcome: Progressing Goal: Cardiovascular complication will be avoided Outcome: Progressing   Problem: Health Behavior/Discharge Planning: Goal: Ability to manage health-related needs will improve Outcome: Progressing   Problem: Activity: Goal: Risk for activity intolerance will decrease Outcome: Progressing   Problem: Elimination: Goal: Will not experience complications related to bowel motility Outcome: Progressing Goal: Will not experience complications related to urinary retention Outcome: Progressing   Problem: Nutrition: Goal: Adequate nutrition will be maintained Outcome: Progressing   "

## 2024-10-28 NOTE — Progress Notes (Signed)
" ° °  Inpatient Rehabilitation Admissions Coordinator   I met with patient and her friend, Arley, at bedside. I have not received a determination on her appeal for Cir admit and we have called today to check on the status of that appeal. She now would like to be discharged home with OP follow up at Neuro rehab. I iwll alert acute tem and TOC. We will sign off.  Heron Leavell, RN, MSN Rehab Admissions Coordinator [phone] 10/28/2024 1:00 PM  "

## 2024-10-28 NOTE — TOC Transition Note (Signed)
 Transition of Care [facility]) - Discharge Note   Patient Details  Name: Paula Murphy MRN: [id number] Date of Birth: 19-Nov-1945  [facility] CM/SW Contact:  Paula JULIANNA George, RN Phone Number: 10/28/2024, 1:39 PM   Clinical Narrative:     Pt has decided to discharge home with outpatient therapy through [facility]. Referral sent and information on the AVS.  Pt has supervision at home and needed DME at home.  Family to transport home.  Final next level of care: OP Rehab Barriers to Discharge: No Barriers Identified   Patient Goals and CMS Choice            Discharge Placement                       Discharge Plan and Services Additional resources added to the After Visit Summary for                                       Social Drivers of Health (SDOH) Interventions SDOH Screenings   Food Insecurity: No Food Insecurity (10/20/2024)  Housing: Low Risk (10/20/2024)  Transportation Needs: No Transportation Needs (10/20/2024)  Utilities: Not At Risk (10/20/2024)  Social Connections: Moderately Integrated (10/20/2024)  Tobacco Use: Low Risk (10/20/2024)     Readmission Risk Interventions     No data to display

## 2024-10-28 NOTE — Progress Notes (Signed)
 Physical Therapy Treatment Patient Details Name: Paula Murphy MRN: [id number] DOB: 1946/04/30 Today's Date: 10/28/2024   History of Present Illness 78 yo F adm 10/19/24 with R facial droop and R side weakness. MRI(+) L basal ganglia CVA. NIH 0 PMH: HLD, macular degeneration, nephrolithiasis, hypothyroidism, HTN, fibromyalgia, LTKA    PT Comments  Patient continues to need cuing (although slightly less) for safe use of RW and to improved Lt foot clearance during ambulation. She self-distracts with conversation and reverts back to short, shuffling steps with left foot dragging. Again educated on incr fall risk due to dragging left foot and need to focus on heelstrike or increased hip/knee flexion. See below for exercises completed. If insurance denies AIR (or pt decides to go home), will benefit from OPPT.     If plan is discharge home, recommend the following: A little help with walking and/or transfers;A little help with bathing/dressing/bathroom;Assistance with cooking/housework;Assist for transportation   Can travel by private vehicle        Equipment Recommendations  None recommended by PT (Pt already has needed DME)    Recommendations for Other Services       Precautions / Restrictions Precautions Precautions: Fall Recall of Precautions/Restrictions: Intact Restrictions Weight Bearing Restrictions Per Provider Order: No     Mobility  Bed Mobility Overal bed mobility: Modified Independent             General bed mobility comments: exited to the L with HOB elevated; returned to bed with incr time/effort    Transfers Overall transfer level: Needs assistance Equipment used: Rolling walker (2 wheels) Transfers: Sit to/from Stand Sit to Stand: Contact guard assist           General transfer comment: VC for hand placement and safe use of RW; vc for marching steps as turning to keep Left foot from dragging/catching (required cues with each turn); total of 10  transfers for practice with hand placment (including to/from toilet)    Ambulation/Gait Ambulation/Gait assistance: Min assist Gait Distance (Feet): 180 Feet (seated rest; 15 x2 to/from bathroom) Assistive device: Rolling walker (2 wheels) Gait Pattern/deviations: Decreased step length - right, Decreased step length - left, Decreased dorsiflexion - left, Shuffle, Narrow base of support, Step-through pattern, Drifts right/left Gait velocity: decreased     General Gait Details: continues to need cues for longer strides, emphasis on heelstrike (mod cues to maintain); vc for marching steps during turns to improve left foot clearance   Stairs             Wheelchair Mobility     Tilt Bed    Modified Rankin (Stroke Patients Only) Modified Rankin (Stroke Patients Only) Pre-Morbid Rankin Score: No significant disability Modified Rankin: Moderately severe disability     Balance Overall balance assessment: Needs assistance Sitting-balance support: No upper extremity supported, Feet supported Sitting balance-Leahy Scale: Fair Sitting balance - Comments: EOB   Standing balance support: Bilateral upper extremity supported, During functional activity, Reliant on assistive device for balance Standing balance-Leahy Scale: Poor Standing balance comment: able to stand without UE support with CGA                            Communication Communication Communication: No apparent difficulties  Cognition Arousal: Alert Behavior During Therapy: WFL for tasks assessed/performed, Flat affect   PT - Cognitive impairments: Awareness  PT - Cognition Comments: Pt A,Ox4. She has delayed processing and decr awareness of deficits/fall risk Following commands: Intact      Cueing Cueing Techniques: Verbal cues  Exercises General Exercises - Lower Extremity Ankle Circles/Pumps: AROM, Left, 10 reps Long Arc Quad: AROM, Both, 10 reps Hip  Flexion/Marching: AROM, Both, 10 reps, Standing (with RW/UE support) Other Exercises Other Exercises: sit to stand to RW x 10    General Comments        Pertinent Vitals/Pain Pain Assessment Pain Assessment: Faces Faces Pain Scale: Hurts even more Pain Location: bil hip bursae Pain Descriptors / Indicators: Constant, Discomfort Pain Intervention(s): Limited activity within patient's tolerance, Monitored during session    Home Living Family/patient expects to be discharged to:: Private residence                   Additional Comments: LLE weakness ever since her 2nd COVID shot in 2021. Family lives nearby, very supportive    Prior Function            PT Goals (current goals can now be found in the care plan section) Acute Rehab PT Goals Patient Stated Goal: Regain indepence to safely return home after IPR Time For Goal Achievement: 11/03/24 Potential to Achieve Goals: Good Progress towards PT goals: Progressing toward goals    Frequency    Min 3X/week      PT Plan      Co-evaluation              AM-PAC PT 6 Clicks Mobility   Outcome Measure  Help needed turning from your back to your side while in a flat bed without using bedrails?: None Help needed moving from lying on your back to sitting on the side of a flat bed without using bedrails?: None Help needed moving to and from a bed to a chair (including a wheelchair)?: A Little Help needed standing up from a chair using your arms (e.g., wheelchair or bedside chair)?: A Little Help needed to walk in hospital room?: A Little Help needed climbing 3-5 steps with a railing? : A Little 6 Click Score: 20    End of Session Equipment Utilized During Treatment: Gait belt Activity Tolerance: Patient tolerated treatment well Patient left: in bed;with call bell/phone within reach;with bed alarm set   PT Visit Diagnosis: Unsteadiness on feet (R26.81);Difficulty in walking, not elsewhere classified  (R26.2);Pain     Time: 9167-9141 PT Time Calculation (min) (ACUTE ONLY): 26 min  Charges:    $Gait Training: 23-37 mins PT General Charges $$ ACUTE PT VISIT: 1 Visit                      Macario RAMAN, PT Acute Rehabilitation Services  Office [phone]    Macario SHAUNNA Soja 10/28/2024, 9:08 AM

## 2024-11-02 ENCOUNTER — Telehealth: Payer: Self-pay

## 2024-11-02 ENCOUNTER — Telehealth: Payer: Self-pay | Admitting: Neurology

## 2024-11-02 DIAGNOSIS — G459 Transient cerebral ischemic attack, unspecified: Secondary | ICD-10-CM

## 2024-11-02 DIAGNOSIS — R531 Weakness: Secondary | ICD-10-CM

## 2024-11-02 NOTE — Transitions of Care (Post Inpatient/ED Visit) (Signed)
 Stroke Discharge Follow-up   11/02/2024 Name:  Paula Murphy MRN:  [id number] DOB:  [birth year]  Subjective: Paula Murphy is a 78 y.o. year old female who is a primary care patient of Nichole Senior, MD An Emmi alert was received indicating patient responded to questions: Scheduled a follow-up appointment?. I reached out by phone to follow up on the alert and spoke to Patient.  Care Management Interventions:  Reviewed with patient that she has all her medications and is taking as prescribed. Reviewed importance of follow up with PCP in 1 week and to have labs drawn.  Reviewed with patient that she is planning to see [facility] neurology and she has already called them and is waiting for a call back.   Patient denies and SDOH needs at this time. I provided my contact information for patient to call me back if she needs any assistance.  Follow up plan: No further intervention required.   Alan Ee, RN, BSN, CEN [employer]- Transition of Care Team.  [facility] [phone]

## 2024-11-02 NOTE — Telephone Encounter (Signed)
 [facility] Call ID: [id number]  [facility]: [phone]  Caller stated that she just got out the hospital and had a stroke. Pt stated a referral was sent over.

## 2024-11-02 NOTE — Telephone Encounter (Signed)
 I do not see where a referral was sent to us , but I see a referral that was sent to [facility] on 10-28-24

## 2024-11-10 NOTE — Therapy (Signed)
 " OUTPATIENT OCCUPATIONAL THERAPY NEURO EVALUATION  Patient Name: Paula Murphy MRN: [id number] DOB:Jun 23, 1946, 79 y.o., female Today's Date: 11/11/2024  PCP: Nichole Senior, MD REFERRING PROVIDER: Cherlyn Labella, MD  END OF SESSION:  OT End of Session - 11/11/24 1303     Visit Number 1    Number of Visits 5   including eval   Date for Recertification  12/19/24    Authorization Type [employer] MCR-auth required    OT Start Time 0931    OT Stop Time 1015    OT Time Calculation (min) 44 min    Equipment Utilized During Treatment red theraband    Activity Tolerance Patient tolerated treatment well    Behavior During Therapy WFL for tasks assessed/performed          Past Medical History:  Diagnosis Date   Arthritis    OA AND PAIN RT KNEE   Autoimmune disease    + ANA, and DS DNA--PT STATES SHE WAS TOLD SHE DOES NOT HAVE LUPUS AS PREVIOUSLY THOUGHT   Fibromyalgia    followed by Dr. 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   on face   TONSILLECTOMY  11/06/1951   TOTAL KNEE ARTHROPLASTY Right 03/02/2013   Procedure: RIGHT TOTAL KNEE ARTHROPLASTY;  Surgeon: Dempsey LULLA Moan, MD;  Location: [facility];  Service: Orthopedics;  Laterality: Right;   TOTAL KNEE ARTHROPLASTY Left 10/10/2020   Procedure: TOTAL KNEE ARTHROPLASTY;  Surgeon: Moan Dempsey, MD;  Location: [facility];  Service: Orthopedics;  Laterality: Left;    Patient Active Problem List   Diagnosis Date Noted   Right sided weakness  10/20/2024   Acute ischemic stroke (HCC) 10/20/2024   TIA (transient ischemic attack) 10/19/2024   Infarction of left basal ganglia (HCC) 10/19/2024   OP (osteoporosis) 12/26/2023   Primary osteoarthritis of left knee 10/10/2020   Ingrown toenail 02/02/2019   Primary osteoarthritis of both feet 03/21/2017   History of macular degeneration 03/21/2017   Other fatigue 03/21/2017   Age-related osteoporosis without current pathological fracture 03/21/2017   Esophageal reflux 06/29/2013   Actinic keratosis 04/11/2013   OA (osteoarthritis) of knee 03/02/2013   DEPRESSION 07/24/2010   Major depressive disorder, single episode, unspecified 07/24/2010   UTI 07/04/2010   ABDOMINAL BLOATING 01/20/2010   Fibromyalgia 10/04/2009   INSOMNIA, CHRONIC 10/04/2009   Autoimmune disease 10/04/2009   Vitamin D  deficiency 10/26/2008   Hypothyroidism 08/13/2008   Mixed hyperlipidemia 08/13/2008   Essential hypertension 08/13/2008   Headache 08/13/2008   Muscle pain 08/12/2008    ONSET DATE: 10/28/2024 referral date, 10/19/24-10/28/24 hospitalization  REFERRING DIAG: R53.1 (ICD-10-CM) - Right sided weakness R29.810 (ICD-10-CM) - Facial droop  THERAPY DIAG:  Other lack of coordination  Muscle weakness (generalized)  Other symptoms and signs involving the musculoskeletal system  Rationale for Evaluation and Treatment: Rehabilitation  SUBJECTIVE:   SUBJECTIVE STATEMENT: Pt reports impaired balance when walking. Pt accompanied  by: self  PERTINENT HISTORY: MRI(+) L basal ganglia CVA. NIH 0 PMH: HLD, macular degeneration, nephrolithiasis, hypothyroidism, HTN, fibromyalgia.   PRECAUTIONS: Other: R sided weakness, blood thinners  WEIGHT BEARING RESTRICTIONS: No  PAIN:  Are you having pain? No  FALLS: Has patient fallen in last 6 months? No  LIVING ENVIRONMENT: Lives with: lives alone Lives in: House/apartment Stairs: No Has following equipment at home: Single point cane, Walker - 4  wheeled, shower chair, Grab bars, and walking stick and 2 more rollators  PLOF: Independent and Independent with household mobility without device worked at [employer]  PATIENT GOALS: I'm going to [employer] the early part of February and my grandson is getting married in May. I want to be able to walk and be as self sufficient as possible for those things.  OBJECTIVE:  Note: Objective measures were completed at Evaluation unless otherwise noted.  HAND DOMINANCE: Right  ADLs: Overall ADLs: Pt reports no troubles with any ADLs except shower/tub transfers. She has a friend who supervises her transfers to/from shower/tub combo. Transfers/ambulation related to ADLs: Eating: I Grooming: I UB Dressing: I LB Dressing: I Toileting: I Bathing: I Tub Shower transfers: SPV/SBA Equipment: Shower seat without back and Grab bars  IADLs: Shopping: Hasn't attempted since stroke, friend or daughter delivering groceries. Light housekeeping: Hired help PLOF once a month, pt was  Meal Prep: Hasn't since stroke Community mobility: Currently not driving  Medication management: Independent Financial management: Independent Handwriting: Pt reports slight changes but that it is getting better every day.  MOBILITY STATUS: Using rollator for functional mobility at this time and noted shuffling gait  POSTURE COMMENTS:  No Significant postural limitations Sitting balance: WFL  ACTIVITY TOLERANCE: Activity tolerance: Not walking as long but pt reports this is d/t fibromyalgia  FUNCTIONAL OUTCOME MEASURES: Upper Extremity Functional Scale (UEFS): 66/80  UPPER EXTREMITY ROM:  4/5 RUE grossly, 4+/5 LUE  Active ROM Right eval Left eval  Shoulder flexion    Shoulder abduction    Shoulder adduction    Shoulder extension    Shoulder internal rotation    Shoulder external rotation    Elbow flexion    Elbow extension    Wrist flexion    Wrist extension    Wrist ulnar deviation    Wrist  radial deviation    Wrist pronation    Wrist supination    (Blank rows = not tested)  UPPER EXTREMITY MMT:     MMT Right eval Left eval  Shoulder flexion    Shoulder abduction    Shoulder adduction    Shoulder extension    Shoulder internal rotation    Shoulder external rotation    Middle trapezius    Lower trapezius    Elbow flexion    Elbow extension    Wrist flexion    Wrist extension    Wrist ulnar deviation    Wrist radial deviation    Wrist pronation    Wrist supination    (Blank rows = not tested)  HAND FUNCTION: Grip strength: Right: 17.33 avg lbs; Left: 13 lbs  COORDINATION: 9 Hole Peg test: Right: 38.77 sec; Left: 46.75 sec Box and Blocks:  Right 26blocks, Left 28blocks  SENSATION: WFL  EDEMA: none  MUSCLE TONE: RUE: Within functional limits and LUE: Within functional limits  COGNITION: Overall cognitive status: Within functional limits for tasks assessed  VISION: Subjective report: I've been to the eye doctor and he said the stroke didn't affect my vision Baseline vision:  Wears glasses for reading only Visual history: cataracts  VISION ASSESSMENT: Not tested  Patient has difficulty with following activities due to following visual impairments: none  PERCEPTION: WFL  PRAXIS: WFL  OBSERVATIONS: impaired hand strength (secondary to fibromyalgia), decline in ability to complete IADL and transfer to/from shower/tub combo, impaired UE strength                                                                                                                             TREATMENT DATE: 11/11/24  Educated pt in purpose of OT, goals, and POC. Pt in agreement with goals. Educated in energy conservation techniques and completed 1x10 shoulder flexion and horizontal ab/adduction with red light resistance theraband.        PATIENT EDUCATION: Education details: see above Person educated: Patient Education method: Solicitor, Verbal cues,  and Handouts Education comprehension: verbalized understanding, returned demonstration, and needs further education  HOME EXERCISE PROGRAM: To be added   GOALS: Goals reviewed with patient? Yes  SHORT TERM GOALS: Target date: 11/25/24  Patient will demonstrate  RUE and LUE HEP with 25% verbal cues or less for proper execution.  Baseline: New to OP OT Goal status: INITIAL  2.  Pt will verbalize understanding of adapted strategies and/or equipment PRN to increase safety and independence with ADLs and IADLs (I.e. adaptive cutting board, rocker knife, etc.)  Baseline: Pt has sticky pad for opening jars Goal status: INITIAL  3.  Pt will be able to place at least 30 blocks using right hand with completion of Box and Blocks test.  Baseline: Right 26blocks, Left 28blocks Goal status: INITIAL   LONG TERM GOALS: Target date: 12/19/24  Pt will increase UEFS score to 70/80 to demonstrate improved function Baseline: 66/80 Goal status: INITIAL  2.  Patient will demonstrate at least 20 lbs R grip strength as needed to open jars and other containers.  Baseline: Right: 17.33 avg lbs; Left: 13 lbs Goal status: INITIAL  3.  Patient will demo improved FM coordination as evidenced by completing nine-hole peg with use of R hand in 35 seconds or less and L hand in 41 seconds or less.  Baseline:  Right: 38.77 sec; Left: 46.75 sec Goal status: INITIAL   ASSESSMENT:  CLINICAL IMPRESSION: Patient is a 79 y.o. female who was seen today for occupational therapy evaluation for R sided weakness post CVA. Hx includes  HLD, macular degeneration, nephrolithiasis, hypothyroidism, HTN, fibromyalgia. Patient currently presents below baseline level of functioning demonstrating functional deficits and impairments as noted below. Pt would benefit from skilled OT services in the outpatient setting to work on impairments as noted below to help pt return to PLOF as able.     PERFORMANCE DEFICITS: in functional  skills including IADLs, strength, pain, endurance, and UE functional use, cognitive skills including sequencing, and psychosocial skills including environmental adaptation, habits, and routines and behaviors.   IMPAIRMENTS: are limiting patient from ADLs, IADLs, leisure, and social participation.  CO-MORBIDITIES: has co-morbidities such as fibromyalgia that affects occupational performance. Patient will benefit from skilled OT to address above impairments and improve overall function.  MODIFICATION OR ASSISTANCE TO COMPLETE EVALUATION: No modification of tasks or assist necessary to complete an evaluation.  OT OCCUPATIONAL PROFILE AND HISTORY: Problem focused assessment: Including review of records relating to presenting problem.  CLINICAL DECISION MAKING: LOW - limited treatment options, no task modification necessary  REHAB POTENTIAL: Good  EVALUATION COMPLEXITY: Low    PLAN:  OT FREQUENCY: 1x/week  OT DURATION: 4 weeks  PLANNED INTERVENTIONS: 97168 OT Re-evaluation, 97535 self care/ADL training, 02889 therapeutic exercise, 97530 therapeutic activity, 97140 manual therapy, 97035 ultrasound, 97010 moist heat, 97010 cryotherapy, energy conservation, coping strategies training, patient/family education, and DME and/or AE instructions  RECOMMENDED OTHER SERVICES: none at this time  CONSULTED AND AGREED WITH PLAN OF CARE: Patient  PLAN FOR NEXT SESSION: AE education  Putty HEP Continue UE theraband HEP Educate in joint protection   Myron Lona, OT 11/11/2024, 1:04 PM   Referring diagnosis:  R26.81, M62.81, R26.89  Treatment diagnosis (if different than referring diagnosis): R27.8, M62.81, R29.898 Date Symptoms Began: 10/19/2024  # of Visits requested: 5 (including eval)  Time period for Authorization: 11/11/24 to 12/19/24  What was this (referring dx) caused by? []  Surgery []  Fall []  Ongoing issue []  Arthritis [x]  Other: _CVA___________  Laterality: [x]  Rt []  Lt []   Both  Functional Tool & Score: UEFS 66/80  Check all possible CPT codes:     See Planned Interventions listed in the Plan section of the Evaluation.     If [employer]: Choose 10 or less codes  If [employer]: Modalities are not covered  If [employer]: Check allowed ICD code combinations   If [facility] Plan or [employer]ign[employer]: Cognitive training not covered         "

## 2024-11-11 ENCOUNTER — Encounter: Payer: Self-pay | Admitting: Physical Therapy

## 2024-11-11 ENCOUNTER — Ambulatory Visit: Admitting: Physical Therapy

## 2024-11-11 ENCOUNTER — Other Ambulatory Visit: Payer: Self-pay

## 2024-11-11 ENCOUNTER — Ambulatory Visit: Attending: Internal Medicine

## 2024-11-11 DIAGNOSIS — R2689 Other abnormalities of gait and mobility: Secondary | ICD-10-CM

## 2024-11-11 DIAGNOSIS — R2681 Unsteadiness on feet: Secondary | ICD-10-CM | POA: Insufficient documentation

## 2024-11-11 DIAGNOSIS — R278 Other lack of coordination: Secondary | ICD-10-CM | POA: Diagnosis present

## 2024-11-11 DIAGNOSIS — R531 Weakness: Secondary | ICD-10-CM | POA: Insufficient documentation

## 2024-11-11 DIAGNOSIS — R29898 Other symptoms and signs involving the musculoskeletal system: Secondary | ICD-10-CM | POA: Insufficient documentation

## 2024-11-11 DIAGNOSIS — M6281 Muscle weakness (generalized): Secondary | ICD-10-CM

## 2024-11-11 DIAGNOSIS — R29810 Facial weakness: Secondary | ICD-10-CM | POA: Insufficient documentation

## 2024-11-11 NOTE — Therapy (Signed)
 " OUTPATIENT PHYSICAL THERAPY NEURO EVALUATION   Patient Name: Paula Murphy MRN: [id number] DOB:15-Aug-1946, 79 y.o., female Today's Date: 11/11/2024   PCP: Nichole Senior, MD  REFERRING PROVIDER: Cherlyn Labella, MD >F/u with Venetia Potters, MD  END OF SESSION:  PT End of Session - 11/11/24 0845     Visit Number 1    Number of Visits 17    Date for Recertification  01/08/25    Authorization Type [employer]-auth submitted    Progress Note Due on Visit 10    PT Start Time 0845    PT Stop Time 0925    PT Time Calculation (min) 40 min    Equipment Utilized During Treatment Gait belt    Activity Tolerance Patient tolerated treatment well    Behavior During Therapy WFL for tasks assessed/performed          Past Medical History:  Diagnosis Date   Arthritis    OA AND PAIN RT KNEE   Autoimmune disease    + ANA, and DS DNA--PT STATES SHE WAS TOLD SHE DOES NOT HAVE LUPUS AS PREVIOUSLY THOUGHT   Fibromyalgia    followed by Dr. 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   on face   TONSILLECTOMY  11/06/1951   TOTAL KNEE ARTHROPLASTY Right 03/02/2013   Procedure: RIGHT TOTAL KNEE ARTHROPLASTY;  Surgeon: Dempsey LULLA Moan, MD;  Location: [facility];  Service: Orthopedics;  Laterality: Right;   TOTAL KNEE ARTHROPLASTY Left 10/10/2020   Procedure: TOTAL KNEE ARTHROPLASTY;  Surgeon: Moan Dempsey, MD;  Location: [facility];  Service: Orthopedics;  Laterality: Left;    Patient Active Problem List    Diagnosis Date Noted   Right sided weakness 10/20/2024   Acute ischemic stroke (HCC) 10/20/2024   TIA (transient ischemic attack) 10/19/2024   Infarction of left basal ganglia (HCC) 10/19/2024   OP (osteoporosis) 12/26/2023   Primary osteoarthritis of left knee 10/10/2020   Ingrown toenail 02/02/2019   Primary osteoarthritis of both feet 03/21/2017   History of macular degeneration 03/21/2017   Other fatigue 03/21/2017   Age-related osteoporosis without current pathological fracture 03/21/2017   Esophageal reflux 06/29/2013   Actinic keratosis 04/11/2013   OA (osteoarthritis) of knee 03/02/2013   DEPRESSION 07/24/2010   Major depressive disorder, single episode, unspecified 07/24/2010   UTI 07/04/2010   ABDOMINAL BLOATING 01/20/2010   Fibromyalgia 10/04/2009   INSOMNIA, CHRONIC 10/04/2009   Autoimmune disease 10/04/2009   Vitamin D  deficiency 10/26/2008   Hypothyroidism 08/13/2008   Mixed hyperlipidemia 08/13/2008   Essential hypertension 08/13/2008   Headache 08/13/2008   Muscle pain 08/12/2008    ONSET DATE: 10/28/2024 MD referral/10/19/2024 TIA  REFERRING DIAG:  R53.1 (ICD-10-CM) - Right sided weakness  R29.810 (ICD-10-CM) - Facial droop    THERAPY DIAG:  Unsteadiness on feet  Muscle weakness (generalized)  Other abnormalities of gait and mobility  Rationale for Evaluation and Treatment: Rehabilitation  SUBJECTIVE:                                                                                                                                                                                             SUBJECTIVE STATEMENT: Having trouble with walking.  Can't walk without support or without walker.  Was able to walk without walker before CVA.   Pt accompanied by: self  PERTINENT HISTORY: history of osteoarthritis, fibromyalgia, hyperlipidemia, hypothyroidism, degenerative disc disease, depression, nephrolithiasis. Patient presented secondary to right-sided weakness  with initial concern for TIA versus stroke. Hx of L sided weakness from Covid (per pt report) PAIN:  Are you having pain? No  PRECAUTIONS: Fall  RED FLAGS: None   WEIGHT BEARING RESTRICTIONS: No  FALLS: Has patient fallen in last 6 months? No  LIVING ENVIRONMENT: Lives with: lives with their family and lives alone Has supportive neighbors and friends Lives in: House/apartment Stairs: No Has following equipment at home: Single point cane, Environmental Consultant - 2 wheeled, Environmental Consultant - 4 wheeled, shower chair, Grab bars, and walking stick  PLOF: Independent and Leisure: enjoys going to [employer], travelling to [place]  PATIENT GOALS: To be able to walk better for upcoming beach trip, to be able to walk better for grandson's wedding in May  OBJECTIVE:  Note: Objective measures were completed at Evaluation unless otherwise noted.  DIAGNOSTIC FINDINGS: MRI confirms a left basal ganglia stroke.   COGNITION: Overall cognitive status: Within functional limits for tasks assessed   SENSATION: Light touch: WFL Reports increased pain due to fibromyalgia  COORDINATION: WFL  EDEMA:  Some edema LLE, that is longstanding (reports MDs are aware, just not sure what causes it)  POSTURE: rounded shoulders, forward head, and flexed trunk  In standing, has knees flexed LOWER EXTREMITY ROM:   WFL  Active  Right Eval Left Eval  Hip flexion    Hip extension    Hip abduction    Hip adduction    Hip internal rotation    Hip external rotation    Knee flexion    Knee extension    Ankle dorsiflexion    Ankle plantarflexion    Ankle inversion    Ankle eversion     (Blank rows = not tested)  LOWER EXTREMITY MMT:    MMT Right Eval Left Eval  Hip flexion 4 4  Hip extension    Hip abduction 4 4  Hip adduction 4 4  Hip internal rotation    Hip external rotation    Knee flexion 4 4  Knee extension 4 4  Ankle dorsiflexion 4 4  Ankle plantarflexion    Ankle inversion  Ankle eversion    (Blank  rows = not tested)  TRANSFERS: Sit to stand: SBA and CGA  Assistive device utilized: None     Stand to sit: SBA and CGA  Assistive device utilized: None      GAIT: Findings: Gait Characteristics: step through pattern, decreased step length- Right, decreased step length- Left, decreased ankle dorsiflexion- Right, decreased ankle dorsiflexion- Left, shuffling, poor foot clearance- Right, and poor foot clearance- Left, Distance walked: 50 ft, Assistive device utilized:Walker - 4 wheeled, and Level of assistance: SBA Pt bumps into furniture, wall/doorways on R, especially with turning FUNCTIONAL TESTS:  5 times sit to stand: 27.5 sec arms crossed at chest Timed up and go (TUG): 25.1 sec 10 meter walk test: 32.62 sec= 1 ft/sec Berg Balance test:  39/56  [facility] PT Assessment - 11/11/24 0914       Standardized Balance Assessment   Standardized Balance Assessment Berg Balance Test      Berg Balance Test   Sit to Stand Able to stand without using hands and stabilize independently    Standing Unsupported Able to stand safely 2 minutes    Sitting with Back Unsupported but Feet Supported on Floor or Stool Able to sit safely and securely 2 minutes    Stand to Sit Sits safely with minimal use of hands    Transfers Able to transfer safely, minor use of hands    Standing Unsupported with Eyes Closed Able to stand 10 seconds with supervision    Standing Unsupported with Feet Together Able to place feet together independently and stand for 1 minute with supervision    From Standing, Reach Forward with Outstretched Arm Can reach forward >12 cm safely (5)    From Standing Position, Pick up Object from Floor Able to pick up shoe, needs supervision    From Standing Position, Turn to Look Behind Over each Shoulder Looks behind one side only/other side shows less weight shift   R> L   Turn 360 Degrees Needs close supervision or verbal cueing   8.91, > 10 sec   Standing Unsupported, Alternately Place Feet on  Step/Stool Needs assistance to keep from falling or unable to try    Standing Unsupported, One Foot in Front Needs help to step but can hold 15 seconds    Standing on One Leg Able to lift leg independently and hold equal to or more than 3 seconds    Total Score 39    Berg comment: Scores <45/56 indicate increased fall risk                                                                                                                                      TREATMENT DATE: 11/11/2024    PATIENT EDUCATION: Education details: PT eval results, POC, initial HEP and cues for heelstrike with gait (to not hear the scuffing/shuffling) Person educated: Patient Education method: Explanation, Demonstration, Verbal cues,  and Handouts Education comprehension: verbalized understanding, returned demonstration, and needs further education  HOME EXERCISE PROGRAM: Access Code: [email or web address] URL: [email or web address] Date: 11/11/2024 Prepared by: [facility] - Outpatient  Rehab - [facility]  Program Notes While walking with your walker, make sure to walk with heel to toe pattern, taking long strides.  Exercises - Seated March  - 1 x daily - 7 x weekly - 3 sets - 10 reps - Seated Long Arc Quad  - 1 x daily - 7 x weekly - 3 sets - 10 reps - Seated Heel Toe Raises  - 1 x daily - 7 x weekly - 3 sets - 10 reps  GOALS: Goals reviewed with patient? Yes  SHORT TERM GOALS: Target date: 12/11/2024  Pt will be independent with HEP for improved strength, balance, gait. Baseline: Goal status: INITIAL  2.  Pt will improve 5x sit<>stand to less than or equal to 20 sec to demonstrate improved functional strength and transfer efficiency. Baseline: 27.5 sec Goal status: INITIAL  3.  Pt will improve Berg score to at least 45/56 to decrease fall risk. Baseline: 39/56 Goal status: INITIAL   LONG TERM GOALS: Target date: 01/08/2025  Pt will be independent with HEP for improved balance, strength,  gait. Baseline:  Goal status: INITIAL  2.  Pt will improve 5x sit<>stand to less than or equal to 15 sec to demonstrate improved functional strength and transfer efficiency. Baseline: 27.5 sec Goal status: INITIAL  3.  Pt will improve TUG score to less than or equal to 15 sec for decreased fall risk. Baseline: 25.1 sec Goal status: INITIAL  4.  Pt will improve gait velocity to at least 2.62 ft/sec for improved gait efficiency and safety. Baseline: 1 ft/sec with 4WW Goal status: INITIAL  5.  Pt will ambulate at least 500 ft, indoor and outdoor surfaces, mod I for improved gait efficiency and safety in the community. Baseline:  Goal status: INITIAL   ASSESSMENT:  CLINICAL IMPRESSION: Patient is a 79 y.o. female who was seen today for physical therapy evaluation and treatment for R sided weakness s/p CVA.  Per MRI, she had CVA affecting left basal ganglia region, with pt also reporting she has hx of fibromyalgia (causing fatigue and leg pain) and baseline LLE weakness (as residual effect from COVID).  She was independent prior to CVA.  She is currently using a [id number]; she presents with decreased functional strength, decreased balance, decreased independence with gait, decreased timing, coordination of gait.  She has fear of falling; she is at increased fall risk per Berg, TUG, gait velocity, and FTSTS measures.  She would benefit from skilled PT to address the above stated deficits for decreased fall risk and improved overall return to functional mobility and independence.  OBJECTIVE IMPAIRMENTS: Abnormal gait, decreased activity tolerance, decreased balance, decreased mobility, difficulty walking, decreased strength, and postural dysfunction.   ACTIVITY LIMITATIONS: carrying, lifting, bending, standing, squatting, transfers, reach over head, and locomotion level  PARTICIPATION LIMITATIONS: meal prep, cleaning, laundry, driving, shopping, and community activity  PERSONAL FACTORS: 3+  comorbidities: see PMH above are also affecting patient's functional outcome.   REHAB POTENTIAL: Good  CLINICAL DECISION MAKING: Evolving/moderate complexity  EVALUATION COMPLEXITY: Moderate  PLAN:  PT FREQUENCY: 2x/week  PT DURATION: 8 weeks plus eval visit  PLANNED INTERVENTIONS: 97750- Physical Performance Testing, 97110-Therapeutic exercises, 97530- Therapeutic activity, [id number]- Neuromuscular re-education, 97535- Self Care, [id number]- Manual therapy, [phone]- Gait training, Patient/Family education, and Balance training  PLAN FOR NEXT SESSION: Review initial  HEP and progress for standing balance, sit to stand/functional strengthening and progression to more independent gait; may need to utilize Nustep to help with flexibility, strength, endurance   Aaronjames Kelsay W., PT 11/11/2024, 11:49 AM  [facility] at [facility] [address], [address] [place], [place] [address] Phone # [phone] Fax # [phone]    Referring diagnosis:   R53.1 (ICD-10-CM) - Right sided weakness  R29.810 (ICD-10-CM) - Facial droop   Treatment diagnosis (if different than referring diagnosis): R26.81, M62.81, R26.89 Date Symptoms Began: 10/19/2024 # of Visits requested: 17  Time period for Authorization: 11/11/2024 to 01/08/2025  What was this (referring dx) caused by? []  Surgery []  Fall []  Ongoing issue []  Arthritis [x]  Other: _CVA___________  Laterality: [x]  Rt []  Lt []  Both  Functional Tool & Score: FTSTS 27.5 sec, TUG 25.1 sec, gt velocity 1 ft/sec, Berg 39/56  Check all possible CPT codes:     See Planned Interventions listed in the Plan section of the Evaluation.     If [employer]: Choose 10 or less codes  If [employer]: Modalities are not covered  If [employer]: Check allowed ICD code combinations   If [facility] Plan or [employer]: Cognitive training not covered    "

## 2024-11-11 NOTE — Patient Instructions (Signed)

## 2024-11-18 NOTE — Progress Notes (Signed)
 "  NEUROLOGY FOLLOW UP OFFICE NOTE  Paula Murphy [id number]  Subjective:  Paula Murphy is a 79 y.o. 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  To briefly review: 07/31/22: Patient's symptoms started about 1.5 years ago (2022) after a COVID vaccine. About 2 weeks later, her left thigh felt numb. She also couldn't walk well. She feels like the entire leg is weak. She had an achy sensation in her thigh for about 6 months before it resolved. She saw her orthopedic doctor thinking it could be related to her knee. The knee checked out though. Her rheumatologist saw her after thinking it could be her fibromyalgia. She then made the connection to the COVID shot and thought that must be what is causing symptoms. She now has numbness in thigh and into the lower leg has tingling on the outside of knee. The numbness and tingling is intermittent. She denies saddle anesthesia. She denies back pain, though she does have chronic pain from fibromyalgia (no change to these symptoms).    She is now dragging her left leg affecting her life. She is also having difficulty holding urine, especially when standing. She is on oxybutynin and seeing urology for this. She denies bowel incontinence. She has no symptoms in the right leg and no symptoms in her arms.    Patient saw pulmonology (Dr. Gladis) on 07/12/22 for 2.5 years of dyspnea. Her primary complaint at that time though was LLE weakness which she felt started after a COVID vaccine. Per notes, she had dyspnea, could not hold urine, and had LLE weakness after the shot. Per exam, no focal weakness or gait abnormality was seen on 07/12/22. Patient was referred to neurology for further evaluation. Per patient, the SOB is related to dragging her left leg.     She is not on medications. She went to PT x 2 for her leg that improved symptoms for the time she was getting it, but then all improvement went away after stopping therapy. She last went to therapy about 1.5 months ago (for about 2 total months). She does some of her home exercises.   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 She has never been treated.   She does not report any constitutional symptoms like fever, night sweats, anorexia or unintentional weight loss.   EtOH use: very rare  Restrictive diet? No Family history of neuropathy/myopathy/NM disease? No   11/21/22: CK, B12, and HbA1c were unremarkable. EMG on 09/24/22 showed no significant abnormalities.   Patient had COVID around Christmas, but has otherwise been well.   Patient saw rheumatology on 10/17/22 for undifferentiated connective tissue disease. Hydroxychloroquine  has helped with fatigue and arthralgias. This was increased to 200 mg BID M-F.   In terms of her left leg, most days are much better. She does still have some bad days, including when it is cold.   She has no new neurologic complaints today.  06/14/23: Patient is having difficulty walking and picking up her feet. She feels like the left side is worse than her right. She had an MRI of her thoracic spine yesterday that has not been read by radiology yet, but does not appear to show any clear abnormalities.   Patient  is on plaquenil  for SLE, but symptoms started well before this medication.   She gets tired very easily and is very fatigued. She denies pain.   PCP questions the need for central imaging or lumbar spine imaging so sent patient back to neurology.  Most recent Assessment and Plan (06/14/23): This is Paula Murphy, a 79 y.o. female with subjective left leg weakness, difficulty walking, and fatigue. She denies any pain. Her work up to date has been significant for dsDNA  positivity (SLE). Her EMG of the left leg was normal with no evidence of peripheral etiology (nerve or muscle), including lumbar radiculopathy. The etiology of symptoms is unclear. A central etiology (brain) is possible, though clear neurologic deficits are not seen today. Fibromyalgia and SLE could be potential explanations for abnormal fatigue resulting in deconditioning, but it would be unusual to be felt so focally (left leg). We discussed MRI brain as a final test to work up a neurologic etiology, but patient has other things including another MRI to pay for and would like to hold off for now. EMG did not show any evidence of radiculopathy and patient has no pain, so MRI lumbar spine would likely be low yield.    Plan: -Discussed MRI brain wo contrast. Patient currently has a lot of financial responsibilities and would like to wait on this. She will call when ready for me to order. -Encouraged patient to stay active -Follow up with rheumatology and PCP as planned -Fall precautions discussed  Return to clinic as needed  Since their last visit: Patient did want MRI brain and this showed no acute process or significant abnormalities (chronic microvascular ischemia) on 07/12/23.  Patient was okay at 4-5am in the morning. In the morning, she was looking at her phone and still felt fine. When she tried to get up, her right leg was weak and would not hold her and was numb. She called her daughter who came over and helped her. She tried to eat her breakfast so her daughter took her to the ED.   Patient presented to ED on 10/19/24 for the right sided weakness. She had no vision difficulties or speech changes. MRI brain showed an acute stroke in the left basal ganglia. She was put on DAPT with aspirin  81 mg daily and plavix  75 mg daily for 3 weeks to be follow by aspirin  monotherapy. She is no longer on plavix  but continues to take aspirin  81 mg daily. She was recently switched from zetia  to nexletol 180 mg  daily. She has tried statins in the past but affects her fibromyalgia too bad, so she can no longer take this.  Since discharge, patient has been going to PT and OT twice weekly. She thinks her walking and strength has improved. She can walk without a walker, but uses it on the advice of PT because she gets tired very quickly. She feels like she moves slowly and occasionally has freezing, but of the left leg.  MEDICATIONS:  Outpatient Encounter Medications as of 11/26/2024  Medication Sig   ALPRAZolam  (XANAX  XR) 0.5 MG 24 hr tablet Take 0.5 mg by mouth daily. (Patient taking differently: Take 0.5 mg by mouth as needed.)   aspirin  EC 81 MG tablet Take 1 tablet (81 mg total) by mouth daily. Swallow whole.   Cholecalciferol  (VITAMIN D3) 5000 units CAPS Take 5,000 Units by mouth daily.    denosumab  (PROLIA ) 60 MG/ML SOSY injection Inject 60 mg into the skin every 6 (six) months. CALL PT  FOR PAYMENT INFORMATION. Courier to rheum: [address], [address], [place] [address]. Appt on 07/13/2024   escitalopram  (LEXAPRO ) 10 MG tablet Take 10 mg by mouth daily.   fluticasone (FLONASE) 50 MCG/ACT nasal spray Place 1 spray into both nostrils daily as needed for allergies.    GEMTESA 75 MG TABS Take 1 tablet by mouth daily.   levothyroxine  (SYNTHROID ) 150 MCG tablet Take 150 mcg by mouth daily before breakfast.   loperamide  (IMODIUM ) 2 MG capsule Take 1 capsule (2 mg total) by mouth 4 (four) times daily as needed for diarrhea or loose stools.   methocarbamol  (ROBAXIN ) 500 MG tablet Take 500 mg by mouth at bedtime as needed for muscle spasms.   Multiple Vitamins-Minerals (PRESERVISION AREDS) CAPS Take 1 capsule by mouth daily.   NEXLETOL 180 MG TABS Take 180 mg by mouth daily.   ondansetron  (ZOFRAN -ODT) 4 MG disintegrating tablet Take 1 tablet (4 mg total) by mouth every 8 (eight) hours as needed.   pantoprazole  (PROTONIX ) 40 MG tablet Take 40 mg by mouth daily.   topiramate  (TOPAMAX ) 50 MG tablet TAKE 3  TABLETS BY MOUTH DAILY   traMADol  (ULTRAM ) 50 MG tablet Take 50 mg by mouth 3 (three) times daily as needed for moderate pain (pain score 4-6).   valACYclovir  (VALTREX ) 500 MG tablet Take 500 mg by mouth daily as needed (fever blister).    ezetimibe  (ZETIA ) 10 MG tablet Take 10 mg by mouth daily.  (Patient not taking: Reported on 11/26/2024)   Facility-Administered Encounter Medications as of 11/26/2024  Medication   [START ON 01/09/2025] denosumab  (PROLIA ) injection 60 mg    PAST MEDICAL HISTORY: Past Medical History:  Diagnosis Date   Arthritis    OA AND PAIN RT KNEE   Autoimmune disease    + ANA, and DS DNA--PT STATES SHE WAS TOLD SHE DOES NOT HAVE LUPUS AS PREVIOUSLY THOUGHT   Fibromyalgia    followed by Dr. 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   on face   TONSILLECTOMY  11/06/1951   TOTAL KNEE ARTHROPLASTY Right 03/02/2013   Procedure: RIGHT TOTAL KNEE ARTHROPLASTY;  Surgeon: Dempsey LULLA Moan, MD;  Location: [facility];  Service: Orthopedics;  Laterality: Right;   TOTAL KNEE ARTHROPLASTY Left 10/10/2020   Procedure: TOTAL KNEE ARTHROPLASTY;  Surgeon: Moan Dempsey, MD;  Location: [facility];  Service: Orthopedics;  Laterality: Left;     ALLERGIES: Allergies[1]  FAMILY HISTORY: Family History  Problem Relation Age of Onset   Stroke Mother    Hypertension Mother    Hyperlipidemia Mother    Cancer Mother        Lung   Alzheimer's disease Mother    COPD  Mother    Stroke Father    Hypertension Father    Diabetes Father        Type II   Hyperlipidemia Father  Cancer Father        Lung   Arthritis Father        Rheumatoid   COPD Father    COPD Brother    Other Brother        back surgery   Thyroid  disease Daughter    Breast cancer Cousin    Breast cancer Cousin     SOCIAL HISTORY: Social History[2] Social History   Social History Narrative   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  Patient appears well-groomed.   Head:  Normocephalic/atraumatic Neck: supple Heart: regular rate and rhythm Lungs: Clear to auscultation bilaterally. Vascular: No carotid bruits.  Neurological Exam: Mental status: alert and oriented, speech fluent and not dysarthric, language intact.  Cranial nerves: CN I: not tested CN II: pupils equal, round and reactive to light, visual fields intact CN III, IV, VI:  full range of motion, no nystagmus, no ptosis CN V: facial sensation intact. CN VII: upper and lower face symmetric CN VIII: hearing intact CN IX, X: uvula midline CN XI: sternocleidomastoid and trapezius muscles intact CN XII: tongue midline  Bulk & Tone: normal. Mild bradykinesia with RAM. No tremors appreciated. Motor:  muscle strength 5/5 except 5-/5 in RLE Deep Tendon Reflexes:  2+ throughout.   Sensation:  Pinprick sensation intact. Finger to nose testing:  Without dysmetria.    Gait:  Normal station and stride.  Romberg negative.  Labs and Imaging review: New results: 10/22/24: BMP unremarkable  10/20/24: CBC unremarkable HbA1c: 5.3 Lipid panel: tChol 136, LDL 85, TG 49  MRI brain wo contrast (07/12/23): IMPRESSION: 1. No acute intracranial abnormality. 2.  Moderate signal changes in the cerebral white matter and pons, most commonly due to chronic small vessel disease. 3. Chronic appearing left sphenoid sinus disease.  CTA head and neck (10/19/24): IMPRESSION: 1. No acute intracranial abnormality. 2. No large vessel occlusion or hemodynamically significant stenosis in the head or neck.  MRI brain wo contrast (10/19/24): IMPRESSION: 1. 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 2. Moderate chronic small vessel ischemic disease involving the periventricular and deep white matter of both hemispheres and the pons.  Previously reviewed results: 05/27/23: CMP unremarkable Vit D wnl   02/20/23: ESR wnl C3 and C4 wnl dsDNA elevated to 50   07/31/22: A1c: 5.7 B12: 1413 CK: 36   09/14/22: Normal or unremarkable: SSA, SSB, ESR,  dsDNA elevated to 67 ANA positive (1:80)   Normal or unremarkable: CBC, vit D, TSH, SSA, SSB  05/29/22: ANA: positive (1:80) dsDNA ab: elevated to 71 CMP remarkable for mildly elevated glucose (108)   Spine xray (03/07/2002): PORTABLE LATERAL VIEW OF THE CERVICAL SPINE - #1  AN INITIAL FILM SHOWS A NEEDLE MARKING THE ANTERIOR DISC SPACE AT C5-6.  PORTABLE LATERAL VIEW OF THE CERVICAL SPINE - #2  A SECOND FILM SHOWS ANTERIOR CERVICAL DISKECTOMY FROM C5 TO C7.  INTERBODY PLUGS APPEAR WELL  POSITIONED AND THERE IS AN ANTERIOR PLATE WITH FIXATION SCREWS WHICH APPEAR WELL POSITIONED.  IMPRESSION  ACDF C5 TO C7.   EMG (09/24/22): NCV & EMG Findings: Extensive electrodiagnostic evaluation of  the left lower limb with additional nerve conduction studies in the right lower limb shows: Left sural and superficial peroneal sensory responses are within normal limits. Bilateral peroneal (EDB) motor responses show reduced amplitude (L1.4, R1.8 mV). Left peroneal (TA) and bilateral tibial (AH) motor responses are within normal limits. H reflex is absent bilaterally. Chronic  motor axon loss changes without accompanying active denervation changes are seen in the left extensor digitorum brevis muscle. All other tested muscles are within normal limits with normal motor unit configuration and recruitment patterns.   Impression: This electrodiagnostic study shows no significant abnormalities. Specifically: No electrodiagnostic evidence of a large fiber polyneuropathy. No electrodiagnostic evidence of a left lumbosacral (L3-S1) motor radiculopathy. No electrodiagnostic evidence of myopathy. Chronic (no active) motor axon loss changes are seen isolated to the intrinsic foot muscles. This finding in isolation is of unclear clinical significance and can be seen as a result of chronic repetitive local trauma from footwear, among other possibilities. Reduced amplitude of bilateral peroneal (EDB) motor responses are likely due to #4 above vs technical in nature. Absent H reflex may be normal for age and in isolation are of unclear clinical significance.  MRI thoracic spine w/wo contrast (06/13/23): IMPRESSION: Mild thoracic spondylosis without central canal or foraminal narrowing. The exam is otherwise negative.  Assessment/Plan:  This is Paula Murphy, a 79 y.o. female with: Left basal ganglia stroke presenting as right sided weakness on 10/19/24. MRI brain confirmed stroke. CTA head and neck with no LVO or significant stenosis. Patient completed 21 days of DAPT and is now on asa 81 mg daily monotherapy. She is on Nexletol for HLD (LDL 85; goal < 70). She cannot take statins due to intolerance (increased fibromyalgia pains). Bradykinesia - will monitor closely as this could be patient being careful after recent stroke. No other evidence of parkinsonism currently.  Plan: -Continue aspirin  81 mg daily -Continue Nexletol 180 mg daily -Continue PT/OT -Will monitor bradykinesia closely -Fall precautions discussed  Return to clinic in 6 months  Total time spent reviewing  records, interview, history/exam, documentation, and coordination of care on day of encounter:  60 min  Venetia Potters, MD     [1]  Allergies Allergen Reactions   Codeine Nausea And Vomiting    NAUSEA & VOMITING   Hydrocodone Nausea And Vomiting   Plaquenil  [Hydroxychloroquine ]     Vision changes per patient   [2]  Social History Tobacco Use   Smoking status: Never    Passive exposure: Past   Smokeless tobacco: Never  Vaping Use   Vaping status: Never Used  Substance Use Topics   Alcohol use: Yes    Comment: rarely   Drug use: No   "

## 2024-11-19 ENCOUNTER — Encounter: Payer: Self-pay | Admitting: Physical Therapy

## 2024-11-19 ENCOUNTER — Ambulatory Visit: Admitting: Physical Therapy

## 2024-11-19 DIAGNOSIS — R2681 Unsteadiness on feet: Secondary | ICD-10-CM

## 2024-11-19 DIAGNOSIS — R278 Other lack of coordination: Secondary | ICD-10-CM | POA: Diagnosis not present

## 2024-11-19 DIAGNOSIS — M6281 Muscle weakness (generalized): Secondary | ICD-10-CM

## 2024-11-19 DIAGNOSIS — R2689 Other abnormalities of gait and mobility: Secondary | ICD-10-CM

## 2024-11-19 NOTE — Therapy (Signed)
 " OUTPATIENT PHYSICAL THERAPY NEURO TREATMENT   Patient Name: Paula Murphy MRN: [id number] DOB:12/06/1945, 79 y.o., female Today's Date: 11/19/2024   PCP: Nichole Senior, MD  REFERRING PROVIDER: Cherlyn Labella, MD >F/u with Venetia Potters, MD  END OF SESSION:  PT End of Session - 11/19/24 1103     Visit Number 2    Number of Visits 17    Date for Recertification  01/08/25    Authorization Type [employer]-11/11/24-01/08/25    Authorization - Visit Number 2    Authorization - Number of Visits 17    Progress Note Due on Visit 10    PT Start Time 1104    PT Stop Time 1144    PT Time Calculation (min) 40 min    Equipment Utilized During Treatment Gait belt    Activity Tolerance Patient tolerated treatment well    Behavior During Therapy WFL for tasks assessed/performed           Past Medical History:  Diagnosis Date   Arthritis    OA AND PAIN RT KNEE   Autoimmune disease    + ANA, and DS DNA--PT STATES SHE WAS TOLD SHE DOES NOT HAVE LUPUS AS PREVIOUSLY THOUGHT   Fibromyalgia    followed by Dr. 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   on face   TONSILLECTOMY  11/06/1951   TOTAL KNEE ARTHROPLASTY Right 03/02/2013   Procedure: RIGHT TOTAL KNEE ARTHROPLASTY;  Surgeon: Dempsey LULLA Moan, MD;  Location: [facility];  Service: Orthopedics;  Laterality: Right;   TOTAL KNEE ARTHROPLASTY Left 10/10/2020   Procedure: TOTAL KNEE ARTHROPLASTY;  Surgeon: Moan Dempsey, MD;  Location: [facility];  Service:  Orthopedics;  Laterality: Left;    Patient Active Problem List   Diagnosis Date Noted   Right sided weakness 10/20/2024   Acute ischemic stroke (HCC) 10/20/2024   TIA (transient ischemic attack) 10/19/2024   Infarction of left basal ganglia (HCC) 10/19/2024   OP (osteoporosis) 12/26/2023   Primary osteoarthritis of left knee 10/10/2020   Ingrown toenail 02/02/2019   Primary osteoarthritis of both feet 03/21/2017   History of macular degeneration 03/21/2017   Other fatigue 03/21/2017   Age-related osteoporosis without current pathological fracture 03/21/2017   Esophageal reflux 06/29/2013   Actinic keratosis 04/11/2013   OA (osteoarthritis) of knee 03/02/2013   DEPRESSION 07/24/2010   Major depressive disorder, single episode, unspecified 07/24/2010   UTI 07/04/2010   ABDOMINAL BLOATING 01/20/2010   Fibromyalgia 10/04/2009   INSOMNIA, CHRONIC 10/04/2009   Autoimmune disease 10/04/2009   Vitamin D  deficiency 10/26/2008   Hypothyroidism 08/13/2008   Mixed hyperlipidemia 08/13/2008   Essential hypertension 08/13/2008   Headache 08/13/2008   Muscle pain 08/12/2008    ONSET DATE: 10/28/2024 MD referral/10/19/2024 TIA  REFERRING DIAG:  R53.1 (ICD-10-CM) - Right sided weakness  R29.810 (ICD-10-CM) - Facial droop    THERAPY DIAG:  Unsteadiness on feet  Muscle weakness (generalized)  Other abnormalities of gait and mobility  Rationale for Evaluation and Treatment: Rehabilitation  SUBJECTIVE:                                                                                                                                                                                             SUBJECTIVE STATEMENT: Feeling better this week.  Been doing my exercises.  Grandson noted that I'm walking better. Pt accompanied by: self  PERTINENT HISTORY: history of osteoarthritis, fibromyalgia, hyperlipidemia, hypothyroidism, degenerative disc disease, depression, nephrolithiasis. Patient  presented secondary to right-sided weakness with initial concern for TIA versus stroke. Hx of L sided weakness from Covid (per pt report) PAIN:  Are you having pain? No  PRECAUTIONS: Fall  RED FLAGS: None   WEIGHT BEARING RESTRICTIONS: No  FALLS: Has patient fallen in last 6 months? No  LIVING ENVIRONMENT: Lives with: lives with their family and lives alone Has supportive neighbors and friends Lives in: House/apartment Stairs: No Has following equipment at home: Single point cane, Environmental Consultant - 2 wheeled, Environmental Consultant - 4 wheeled, shower chair, Grab bars, and walking stick  PLOF: Independent and Leisure: enjoys going to [employer], travelling to [place]  PATIENT GOALS: To be able to walk better for upcoming beach trip, to be able to walk better for grandson's wedding in May  OBJECTIVE:    TODAY'S TREATMENT: 11/19/2024 Activity Comments  Reviewed HEP Good return demo; performed 2nd set with 3 sec hold  NuStep, Level 3, 4 extremities x 6 minutes Cues to >60-70 SPM; 30 sec bouts >75  Forward/back walking at counter, 3 reps Cues for foot clearance  Sidestepping at counter, 3 reps Cues for foot clearance  Alternating step taps 2 x 10 reps BUE>1 UE support, cues for foot clearance, to improve SLS  Sit to stand 5 reps from elevated mat, simulating bed, then 5 reps from 18 mat surface Hands on knees Cues for full knee extension/glut activation upon standing  Gait x 2 minutes with 5TT (simulating hallway at home) Cues for posture, foot clearance, more shuffling with fatigue  Short distance gait in clinic, no walker Pt reports not using walker at home; cues for foot clearance, step length  Access Code: [email or web address] URL: [email or web address] Date: 11/19/2024 Prepared by: [facility] - Outpatient  Rehab - [facility]  Program Notes While walking with your walker, make sure to walk with heel to toe pattern, taking long strides. Walk in hallway at home, 2 minutes, 3x/day with  walker  Exercises - Seated March  - 1 x daily - 7 x weekly - 3 sets - 10 reps - Seated Long  Arc Quad  - 1 x daily - 7 x weekly - 3 sets - 10 reps - Seated Heel Toe Raises  - 1 x daily - 7 x weekly - 3 sets - 10 reps - Sit to Stand  - 1-2 x daily - 7 x weekly - 3 sets - 5 reps - Alternating Step Taps with Counter Support  - 1 x daily - 5 x weekly - 2 sets - 10 reps  PATIENT EDUCATION: Education details: HEP updates-see above Person educated: Patient Education method: Programmer, Multimedia, Demonstration, and Handouts Education comprehension: verbalized understanding, returned demonstration, verbal cues required, and needs further education  ----------------------------- Note: Objective measures were completed at Evaluation unless otherwise noted.  DIAGNOSTIC FINDINGS: MRI confirms a left basal ganglia stroke.   COGNITION: Overall cognitive status: Within functional limits for tasks assessed   SENSATION: Light touch: WFL Reports increased pain due to fibromyalgia  COORDINATION: WFL  EDEMA:  Some edema LLE, that is longstanding (reports MDs are aware, just not sure what causes it)  POSTURE: rounded shoulders, forward head, and flexed trunk  In standing, has knees flexed LOWER EXTREMITY ROM:   WFL  Active  Right Eval Left Eval  Hip flexion    Hip extension    Hip abduction    Hip adduction    Hip internal rotation    Hip external rotation    Knee flexion    Knee extension    Ankle dorsiflexion    Ankle plantarflexion    Ankle inversion    Ankle eversion     (Blank rows = not tested)  LOWER EXTREMITY MMT:    MMT Right Eval Left Eval  Hip flexion 4 4  Hip extension    Hip abduction 4 4  Hip adduction 4 4  Hip internal rotation    Hip external rotation    Knee flexion 4 4  Knee extension 4 4  Ankle dorsiflexion 4 4  Ankle plantarflexion    Ankle inversion    Ankle eversion    (Blank rows = not tested)  TRANSFERS: Sit to stand: SBA and CGA  Assistive device  utilized: None     Stand to sit: SBA and CGA  Assistive device utilized: None      GAIT: Findings: Gait Characteristics: step through pattern, decreased step length- Right, decreased step length- Left, decreased ankle dorsiflexion- Right, decreased ankle dorsiflexion- Left, shuffling, poor foot clearance- Right, and poor foot clearance- Left, Distance walked: 50 ft, Assistive device utilized:Walker - 4 wheeled, and Level of assistance: SBA Pt bumps into furniture, wall/doorways on R, especially with turning FUNCTIONAL TESTS:  5 times sit to stand: 27.5 sec arms crossed at chest Timed up and go (TUG): 25.1 sec 10 meter walk test: 32.62 sec= 1 ft/sec Berg Balance test:  39/56  TREATMENT DATE: 11/11/2024    PATIENT EDUCATION: Education details: PT eval results, POC, initial HEP and cues for heelstrike with gait (to not hear the scuffing/shuffling) Person educated: Patient Education method: Explanation, Demonstration, Verbal cues, and Handouts Education comprehension: verbalized understanding, returned demonstration, and needs further education  HOME EXERCISE PROGRAM: Access Code: [email or web address] URL: [email or web address] Date: 11/11/2024 Prepared by: [facility] - Outpatient  Rehab - [facility]  Program Notes While walking with your walker, make sure to walk with heel to toe pattern, taking long strides.  Exercises - Seated March  - 1 x daily - 7 x weekly - 3 sets - 10 reps - Seated Long Arc Quad  - 1 x daily - 7 x weekly - 3 sets - 10 reps - Seated Heel Toe Raises  - 1 x daily - 7 x weekly - 3 sets - 10 reps  GOALS: Goals reviewed with patient? Yes  SHORT TERM GOALS: Target date: 12/11/2024  Pt will be independent with HEP for improved strength, balance, gait. Baseline: Goal status: INITIAL  2.  Pt will improve 5x sit<>stand to less than  or equal to 20 sec to demonstrate improved functional strength and transfer efficiency. Baseline: 27.5 sec Goal status: INITIAL  3.  Pt will improve Berg score to at least 45/56 to decrease fall risk. Baseline: 39/56 Goal status: INITIAL   LONG TERM GOALS: Target date: 01/08/2025  Pt will be independent with HEP for improved balance, strength, gait. Baseline:  Goal status: INITIAL  2.  Pt will improve 5x sit<>stand to less than or equal to 15 sec to demonstrate improved functional strength and transfer efficiency. Baseline: 27.5 sec Goal status: INITIAL  3.  Pt will improve TUG score to less than or equal to 15 sec for decreased fall risk. Baseline: 25.1 sec Goal status: INITIAL  4.  Pt will improve gait velocity to at least 2.62 ft/sec for improved gait efficiency and safety. Baseline: 1 ft/sec with 4WW Goal status: INITIAL  5.  Pt will ambulate at least 500 ft, indoor and outdoor surfaces, mod I for improved gait efficiency and safety in the community. Baseline:  Goal status: INITIAL   ASSESSMENT:  CLINICAL IMPRESSION: Pt presents today with reports of feeling better and working on exercises. Skilled PT session focused on lower extremity strengthening and balance.  With fatigue on NuStep, with counter balance exercises, and with gait, pt notes increased shuffling, decreased foot clearance, requiring UE support and cues to stop and reset for better foot clearance and step length. Able to update HEP for additional functional strengthening as well as SLS balance. Pt will continue to benefit from skilled PT towards goals for improved functional mobility and decreased fall risk.   EVAL:  Patient is a 79 y.o. female who was seen today for physical therapy evaluation and treatment for R sided weakness s/p CVA.  Per MRI, she had CVA affecting left basal ganglia region, with pt also reporting she has hx of fibromyalgia (causing fatigue and leg pain) and baseline LLE weakness (as residual  effect from COVID).  She was independent prior to CVA.  She is currently using a [id number]; she presents with decreased functional strength, decreased balance, decreased independence with gait, decreased timing, coordination of gait.  She has fear of falling; she is at increased fall risk per Berg, TUG, gait velocity, and FTSTS measures.  She would benefit from skilled PT to address the above stated deficits for decreased fall risk and improved overall return to functional mobility  and independence.  OBJECTIVE IMPAIRMENTS: Abnormal gait, decreased activity tolerance, decreased balance, decreased mobility, difficulty walking, decreased strength, and postural dysfunction.   ACTIVITY LIMITATIONS: carrying, lifting, bending, standing, squatting, transfers, reach over head, and locomotion level  PARTICIPATION LIMITATIONS: meal prep, cleaning, laundry, driving, shopping, and community activity  PERSONAL FACTORS: 3+ comorbidities: see PMH above are also affecting patient's functional outcome.   REHAB POTENTIAL: Good  CLINICAL DECISION MAKING: Evolving/moderate complexity  EVALUATION COMPLEXITY: Moderate  PLAN:  PT FREQUENCY: 2x/week  PT DURATION: 8 weeks plus eval visit  PLANNED INTERVENTIONS: 97750- Physical Performance Testing, 97110-Therapeutic exercises, 97530- Therapeutic activity, 97112- Neuromuscular re-education, 97535- Self Care, [id number]- Manual therapy, [phone]- Gait training, Patient/Family education, and Balance training  PLAN FOR NEXT SESSION: Review additions to HEP and progress for standing balance, sit to stand/functional strengthening and progression to more independent gait; may need to utilize Nustep to help with flexibility, strength, endurance   Elisabet Gutzmer W., PT 11/19/2024, 11:06 AM  [facility] at [facility] [address], [address] [place], [place] [address] Phone # [phone] Fax # [phone]         "

## 2024-11-20 ENCOUNTER — Ambulatory Visit: Admitting: Physical Therapy

## 2024-11-20 ENCOUNTER — Ambulatory Visit: Admitting: Occupational Therapy

## 2024-11-24 NOTE — Therapy (Signed)
 " OUTPATIENT PHYSICAL THERAPY NEURO TREATMENT   Patient Name: Paula Murphy MRN: [id number] DOB:[birth year], 79 y.o., female Today's Date: 11/25/2024   PCP: Nichole Senior, MD  REFERRING PROVIDER: Cherlyn Labella, MD >F/u with Venetia Potters, MD  END OF SESSION:  PT End of Session - 11/25/24 1100     Visit Number 3    Number of Visits 17    Date for Recertification  01/08/25    Authorization Type [employer]-11/11/24-01/08/25    Authorization - Visit Number 3    Authorization - Number of Visits 17    Progress Note Due on Visit 10    PT Start Time 1018    PT Stop Time 1058    PT Time Calculation (min) 40 min    Equipment Utilized During Treatment Gait belt    Activity Tolerance Patient tolerated treatment well    Behavior During Therapy WFL for tasks assessed/performed            Past Medical History:  Diagnosis Date   Arthritis    OA AND PAIN RT KNEE   Autoimmune disease    + ANA, and DS DNA--PT STATES SHE WAS TOLD SHE DOES NOT HAVE LUPUS AS PREVIOUSLY THOUGHT   Fibromyalgia    followed by Dr. 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   on face   TONSILLECTOMY  11/06/1951   TOTAL KNEE ARTHROPLASTY Right 03/02/2013   Procedure: RIGHT TOTAL KNEE ARTHROPLASTY;  Surgeon: Dempsey LULLA Moan, MD;  Location: [facility];  Service: Orthopedics;  Laterality: Right;   TOTAL KNEE ARTHROPLASTY Left 10/10/2020   Procedure: TOTAL KNEE ARTHROPLASTY;  Surgeon: Moan Dempsey, MD;  Location: [facility];   Service: Orthopedics;  Laterality: Left;    Patient Active Problem List   Diagnosis Date Noted   Right sided weakness 10/20/2024   Acute ischemic stroke (HCC) 10/20/2024   TIA (transient ischemic attack) 10/19/2024   Infarction of left basal ganglia (HCC) 10/19/2024   OP (osteoporosis) 12/26/2023   Primary osteoarthritis of left knee 10/10/2020   Ingrown toenail 02/02/2019   Primary osteoarthritis of both feet 03/21/2017   History of macular degeneration 03/21/2017   Other fatigue 03/21/2017   Age-related osteoporosis without current pathological fracture 03/21/2017   Esophageal reflux 06/29/2013   Actinic keratosis 04/11/2013   OA (osteoarthritis) of knee 03/02/2013   DEPRESSION 07/24/2010   Major depressive disorder, single episode, unspecified 07/24/2010   UTI 07/04/2010   ABDOMINAL BLOATING 01/20/2010   Fibromyalgia 10/04/2009   INSOMNIA, CHRONIC 10/04/2009   Autoimmune disease 10/04/2009   Vitamin D  deficiency 10/26/2008   Hypothyroidism 08/13/2008   Mixed hyperlipidemia 08/13/2008   Essential hypertension 08/13/2008   Headache 08/13/2008   Muscle pain 08/12/2008    ONSET DATE: 10/28/2024 MD referral/10/19/2024 TIA  REFERRING DIAG:  R53.1 (ICD-10-CM) - Right sided weakness  R29.810 (ICD-10-CM) - Facial droop    THERAPY DIAG:  Unsteadiness on feet  Muscle weakness (generalized)  Other abnormalities of gait and mobility  Rationale for Evaluation and Treatment: Rehabilitation  SUBJECTIVE:                                                                                                                                                                                             SUBJECTIVE STATEMENT: Back is feeling better with the heat OT gave me. Reports that's my fibromyalgia. Amy did a great job last time but I was so tired that I needed help to the car.   Pt accompanied by: self  PERTINENT HISTORY: history of osteoarthritis, fibromyalgia,  hyperlipidemia, hypothyroidism, degenerative disc disease, depression, nephrolithiasis. Patient presented secondary to right-sided weakness with initial concern for TIA versus stroke. Hx of L sided weakness from Covid (per pt report) PAIN:  Are you having pain? It's just about gone  PRECAUTIONS: Fall  RED FLAGS: None   WEIGHT BEARING RESTRICTIONS: No  FALLS: Has patient fallen in last 6 months? No  LIVING ENVIRONMENT: Lives with: lives with their family and lives alone Has supportive neighbors and friends Lives in: House/apartment Stairs: No Has following equipment at home: Single point cane, Environmental Consultant - 2 wheeled, Environmental Consultant - 4 wheeled, shower chair, Grab bars, and walking stick  PLOF: Independent and Leisure: enjoys going to [employer], travelling to [employer]  PATIENT GOALS: To be able to walk better for upcoming beach trip, to be able to walk better for grandson's wedding in May  OBJECTIVE:     TODAY'S TREATMENT: 11/25/24 Activity Comments  Nustep L4 x 6 min UEs/LEs  Dynamic warm up   review of HEP updates: STS 10x  alt step taps Cueing to achieve TKE with STS. Able to perform taps without UE support   STS with 7# 5x Good ability  alt backwards steps Backwards walking  alt side step Sidestep over obstacles  Romberg EO/EC, then added head turns/nods  Tandem balance  At TM rail. Cueing for longer L step back. Able to perform sidestep with 1 UE support. Required short sit breaks d/t fatigue and some dizziness which dissipated with short water  break. Most imbalance with tandem balance, requiring light CGA-min A             PATIENT EDUCATION: Education details: discussed pt communicating her fatigue level throughout session to avoid over-exertion; advised to add light weight to STS at home; answered pt's questions on shoes Person educated: Patient Education method: Explanation Education comprehension: verbalized understanding   Access Code: [email or web address] URL:  [email or web address] Date: 11/19/2024 Prepared by: [facility] - Outpatient  Rehab - [facility]  Program Notes While walking with your walker,  make sure to walk with heel to toe pattern, taking long strides. Walk in hallway at home, 2 minutes, 3x/day with walker  Exercises - Seated March  - 1 x daily - 7 x weekly - 3 sets - 10 reps - Seated Long Arc Quad  - 1 x daily - 7 x weekly - 3 sets - 10 reps - Seated Heel Toe Raises  - 1 x daily - 7 x weekly - 3 sets - 10 reps - Sit to Stand  - 1-2 x daily - 7 x weekly - 3 sets - 5 reps - Alternating Step Taps with Counter Support  - 1 x daily - 5 x weekly - 2 sets - 10 reps    ----------------------------- Note: Objective measures were completed at Evaluation unless otherwise noted.  DIAGNOSTIC FINDINGS: MRI confirms a left basal ganglia stroke.   COGNITION: Overall cognitive status: Within functional limits for tasks assessed   SENSATION: Light touch: WFL Reports increased pain due to fibromyalgia  COORDINATION: WFL  EDEMA:  Some edema LLE, that is longstanding (reports MDs are aware, just not sure what causes it)  POSTURE: rounded shoulders, forward head, and flexed trunk  In standing, has knees flexed LOWER EXTREMITY ROM:   WFL  Active  Right Eval Left Eval  Hip flexion    Hip extension    Hip abduction    Hip adduction    Hip internal rotation    Hip external rotation    Knee flexion    Knee extension    Ankle dorsiflexion    Ankle plantarflexion    Ankle inversion    Ankle eversion     (Blank rows = not tested)  LOWER EXTREMITY MMT:    MMT Right Eval Left Eval  Hip flexion 4 4  Hip extension    Hip abduction 4 4  Hip adduction 4 4  Hip internal rotation    Hip external rotation    Knee flexion 4 4  Knee extension 4 4  Ankle dorsiflexion 4 4  Ankle plantarflexion    Ankle inversion    Ankle eversion    (Blank rows = not tested)  TRANSFERS: Sit to stand: SBA and CGA  Assistive  device utilized: None     Stand to sit: SBA and CGA  Assistive device utilized: None      GAIT: Findings: Gait Characteristics: step through pattern, decreased step length- Right, decreased step length- Left, decreased ankle dorsiflexion- Right, decreased ankle dorsiflexion- Left, shuffling, poor foot clearance- Right, and poor foot clearance- Left, Distance walked: 50 ft, Assistive device utilized:Walker - 4 wheeled, and Level of assistance: SBA Pt bumps into furniture, wall/doorways on R, especially with turning FUNCTIONAL TESTS:  5 times sit to stand: 27.5 sec arms crossed at chest Timed up and go (TUG): 25.1 sec 10 meter walk test: 32.62 sec= 1 ft/sec Berg Balance test:  39/56  TREATMENT DATE: 11/11/2024    PATIENT EDUCATION: Education details: PT eval results, POC, initial HEP and cues for heelstrike with gait (to not hear the scuffing/shuffling) Person educated: Patient Education method: Explanation, Demonstration, Verbal cues, and Handouts Education comprehension: verbalized understanding, returned demonstration, and needs further education  HOME EXERCISE PROGRAM: Access Code: [email or web address] URL: [email or web address] Date: 11/11/2024 Prepared by: [facility] - Outpatient  Rehab - [facility]  Program Notes While walking with your walker, make sure to walk with heel to toe pattern, taking long strides.  Exercises - Seated March  - 1 x daily - 7 x weekly - 3 sets - 10 reps - Seated Long Arc Quad  - 1 x daily - 7 x weekly - 3 sets - 10 reps - Seated Heel Toe Raises  - 1 x daily - 7 x weekly - 3 sets - 10 reps  GOALS: Goals reviewed with patient? Yes  SHORT TERM GOALS: Target date: 12/11/2024  Pt will be independent with HEP for improved strength, balance, gait. Baseline: Goal status: INITIAL  2.  Pt will improve 5x sit<>stand to  less than or equal to 20 sec to demonstrate improved functional strength and transfer efficiency. Baseline: 27.5 sec Goal status: INITIAL  3.  Pt will improve Berg score to at least 45/56 to decrease fall risk. Baseline: 39/56 Goal status: INITIAL   LONG TERM GOALS: Target date: 01/08/2025  Pt will be independent with HEP for improved balance, strength, gait. Baseline:  Goal status: INITIAL  2.  Pt will improve 5x sit<>stand to less than or equal to 15 sec to demonstrate improved functional strength and transfer efficiency. Baseline: 27.5 sec Goal status: INITIAL  3.  Pt will improve TUG score to less than or equal to 15 sec for decreased fall risk. Baseline: 25.1 sec Goal status: INITIAL  4.  Pt will improve gait velocity to at least 2.62 ft/sec for improved gait efficiency and safety. Baseline: 1 ft/sec with 4WW Goal status: INITIAL  5.  Pt will ambulate at least 500 ft, indoor and outdoor surfaces, mod I for improved gait efficiency and safety in the community. Baseline:  Goal status: INITIAL   ASSESSMENT:  CLINICAL IMPRESSION: Patient arrived to session with report of improved LBP after application of heat after OT session. Session focused on review of HEP update and progression of standing balance activities. Patient was able to reduce UE support with today's tasks and did well with addition of weighted resistance with STS. Noted some fatigue and brief c/o dizziness which dissipated with sit break and she was able to proceed with session. Patient tolerated session well and without complaints at end of appointment.    OBJECTIVE IMPAIRMENTS: Abnormal gait, decreased activity tolerance, decreased balance, decreased mobility, difficulty walking, decreased strength, and postural dysfunction.   ACTIVITY LIMITATIONS: carrying, lifting, bending, standing, squatting, transfers, reach over head, and locomotion level  PARTICIPATION LIMITATIONS: meal prep, cleaning, laundry,  driving, shopping, and community activity  PERSONAL FACTORS: 3+ comorbidities: see PMH above are also affecting patient's functional outcome.   REHAB POTENTIAL: Good  CLINICAL DECISION MAKING: Evolving/moderate complexity  EVALUATION COMPLEXITY: Moderate  PLAN:  PT FREQUENCY: 2x/week  PT DURATION: 8 weeks plus eval visit  PLANNED INTERVENTIONS: 97750- Physical Performance Testing, 97110-Therapeutic exercises, 97530- Therapeutic activity, 97112- Neuromuscular re-education, 97535- Self Care, [id number]- Manual therapy, [phone]- Gait training, Patient/Family education, and Balance training  PLAN FOR NEXT SESSION: Review additions to HEP and progress for standing balance, sit to stand/functional strengthening and progression to more  independent gait; may need to utilize Nustep to help with flexibility, strength, endurance   Louana Terrilyn Christians, PT, DPT 11/25/24 11:02 AM  [facility] at [facility] [address] [place], [address] [place], [place] [address] Phone # [phone] Fax # [phone]        "

## 2024-11-25 ENCOUNTER — Ambulatory Visit: Admitting: Physical Therapy

## 2024-11-25 ENCOUNTER — Ambulatory Visit: Admitting: Occupational Therapy

## 2024-11-25 ENCOUNTER — Encounter: Payer: Self-pay | Admitting: Physical Therapy

## 2024-11-25 DIAGNOSIS — R2689 Other abnormalities of gait and mobility: Secondary | ICD-10-CM

## 2024-11-25 DIAGNOSIS — R278 Other lack of coordination: Secondary | ICD-10-CM

## 2024-11-25 DIAGNOSIS — M6281 Muscle weakness (generalized): Secondary | ICD-10-CM

## 2024-11-25 DIAGNOSIS — R2681 Unsteadiness on feet: Secondary | ICD-10-CM

## 2024-11-25 DIAGNOSIS — R29898 Other symptoms and signs involving the musculoskeletal system: Secondary | ICD-10-CM

## 2024-11-25 NOTE — Patient Instructions (Signed)
 Paula Murphy

## 2024-11-25 NOTE — Therapy (Signed)
 " OUTPATIENT OCCUPATIONAL THERAPY NEURO Treatment Note  Patient Name: Paula Murphy MRN: [id number] DOB:[birth year], 79 y.o., female Today's Date: 11/25/2024  PCP: Nichole Senior, MD REFERRING PROVIDER: Cherlyn Labella, MD  END OF SESSION:  OT End of Session - 11/25/24 1010     Visit Number 2    Number of Visits 5   including eval   Date for Recertification  12/19/24    Authorization Type [employer] MCR-auth required    Authorization Time Period Auth#: [id number], approved 5 OT visits from 11/11/24-12/19/24.    OT Start Time [phone]    OT Stop Time 1013    OT Time Calculation (min) 42 min    Equipment Utilized During Treatment red theraband    Activity Tolerance Patient tolerated treatment well    Behavior During Therapy WFL for tasks assessed/performed           Past Medical History:  Diagnosis Date   Arthritis    OA AND PAIN RT KNEE   Autoimmune disease    + ANA, and DS DNA--PT STATES SHE WAS TOLD SHE DOES NOT HAVE LUPUS AS PREVIOUSLY THOUGHT   Fibromyalgia    followed by Dr. 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   on face   TONSILLECTOMY  11/06/1951   TOTAL KNEE ARTHROPLASTY Right 03/02/2013   Procedure: RIGHT TOTAL KNEE ARTHROPLASTY;  Surgeon: Dempsey LULLA Moan, MD;  Location: [facility];  Service: Orthopedics;  Laterality: Right;   TOTAL KNEE ARTHROPLASTY Left 10/10/2020   Procedure: TOTAL KNEE ARTHROPLASTY;  Surgeon: Moan Dempsey, MD;  Location: [facility];  Service: Orthopedics;   Laterality: Left;    Patient Active Problem List   Diagnosis Date Noted   Right sided weakness 10/20/2024   Acute ischemic stroke (HCC) 10/20/2024   TIA (transient ischemic attack) 10/19/2024   Infarction of left basal ganglia (HCC) 10/19/2024   OP (osteoporosis) 12/26/2023   Primary osteoarthritis of left knee 10/10/2020   Ingrown toenail 02/02/2019   Primary osteoarthritis of both feet 03/21/2017   History of macular degeneration 03/21/2017   Other fatigue 03/21/2017   Age-related osteoporosis without current pathological fracture 03/21/2017   Esophageal reflux 06/29/2013   Actinic keratosis 04/11/2013   OA (osteoarthritis) of knee 03/02/2013   DEPRESSION 07/24/2010   Major depressive disorder, single episode, unspecified 07/24/2010   UTI 07/04/2010   ABDOMINAL BLOATING 01/20/2010   Fibromyalgia 10/04/2009   INSOMNIA, CHRONIC 10/04/2009   Autoimmune disease 10/04/2009   Vitamin D  deficiency 10/26/2008   Hypothyroidism 08/13/2008   Mixed hyperlipidemia 08/13/2008   Essential hypertension 08/13/2008   Headache 08/13/2008   Muscle pain 08/12/2008    ONSET DATE: 10/28/2024 referral date, 10/19/24-10/28/24 hospitalization  REFERRING DIAG: R53.1 (ICD-10-CM) - Right sided weakness R29.810 (ICD-10-CM) - Facial droop  THERAPY DIAG:  Muscle weakness (generalized)  Other abnormalities of gait and mobility  Other lack of coordination  Other symptoms and signs involving the  musculoskeletal system  Rationale for Evaluation and Treatment: Rehabilitation  SUBJECTIVE:   SUBJECTIVE STATEMENT: Pt reports I'm not moving well at all.  Pt recognizing that she is having more difficulty picking up her left leg today - reporting that sometimes this happens. Pt accompanied by: self  PERTINENT HISTORY: MRI(+) L basal ganglia CVA. NIH 0 PMH: HLD, macular degeneration, nephrolithiasis, hypothyroidism, HTN, fibromyalgia.   PRECAUTIONS: Other: R sided weakness, blood  thinners  WEIGHT BEARING RESTRICTIONS: No  PAIN:  Are you having pain? Reporting back pain this morning and taking meds prior to session, however increased in unsupported sitting to 8-9/10, therefore applied moist heat to back  FALLS: Has patient fallen in last 6 months? No  LIVING ENVIRONMENT: Lives with: lives alone Lives in: House/apartment Stairs: No Has following equipment at home: Single point cane, Walker - 4 wheeled, shower chair, Grab bars, and walking stick and 2 more rollators  PLOF: Independent and Independent with household mobility without device worked at [employer]  PATIENT GOALS: I'm going to [employer] the early part of February and my grandson is getting married in May. I want to be able to walk and be as self sufficient as possible for those things.  OBJECTIVE:  Note: Objective measures were completed at Evaluation unless otherwise noted.  HAND DOMINANCE: Right  ADLs: Overall ADLs: Pt reports no troubles with any ADLs except shower/tub transfers. She has a friend who supervises her transfers to/from shower/tub combo. Transfers/ambulation related to ADLs: Eating: I Grooming: I UB Dressing: I LB Dressing: I Toileting: I Bathing: I Tub Shower transfers: SPV/SBA Equipment: Shower seat without back and Grab bars  IADLs: Shopping: Hasn't attempted since stroke, friend or daughter delivering groceries. Light housekeeping: Hired help PLOF once a month, pt was  Meal Prep: Hasn't since stroke Community mobility: Currently not driving  Medication management: Independent Financial management: Independent Handwriting: Pt reports slight changes but that it is getting better every day.  MOBILITY STATUS: Using rollator for functional mobility at this time and noted shuffling gait  POSTURE COMMENTS:  No Significant postural limitations Sitting balance: WFL  ACTIVITY TOLERANCE: Activity tolerance: Not walking as long but pt reports this is d/t  fibromyalgia  FUNCTIONAL OUTCOME MEASURES: Upper Extremity Functional Scale (UEFS): 66/80  UPPER EXTREMITY ROM:  4/5 RUE grossly, 4+/5 LUE  Active ROM Right eval Left eval  Shoulder flexion    Shoulder abduction    Shoulder adduction    Shoulder extension    Shoulder internal rotation    Shoulder external rotation    Elbow flexion    Elbow extension    Wrist flexion    Wrist extension    Wrist ulnar deviation    Wrist radial deviation    Wrist pronation    Wrist supination    (Blank rows = not tested)  UPPER EXTREMITY MMT:     MMT Right eval Left eval  Shoulder flexion    Shoulder abduction    Shoulder adduction    Shoulder extension    Shoulder internal rotation    Shoulder external rotation    Middle trapezius    Lower trapezius    Elbow flexion    Elbow extension    Wrist flexion    Wrist extension    Wrist ulnar deviation    Wrist radial deviation    Wrist pronation    Wrist supination    (Blank rows = not tested)  HAND FUNCTION: Grip strength: Right: 17.33 avg lbs; Left: 13 lbs  COORDINATION: 9 Hole Peg test: Right: 38.77 sec; Left: 46.75 sec Box and Blocks:  Right 26blocks, Left 28blocks  SENSATION: WFL  EDEMA: none  MUSCLE TONE: RUE: Within functional limits and LUE: Within functional limits  COGNITION: Overall cognitive status: Within functional limits for tasks assessed  VISION: Subjective report: I've been to the eye doctor and he said the stroke didn't affect my vision Baseline vision: Wears glasses for reading only Visual history: cataracts  VISION ASSESSMENT: Not tested  Patient has difficulty with following activities due to following visual impairments: none  PERCEPTION: WFL  PRAXIS: WFL  OBSERVATIONS: impaired hand strength (secondary to fibromyalgia), decline in ability to complete IADL and transfer to/from shower/tub combo, impaired UE strength                                                                                                                              TREATMENT DATE:  11/25/24 NMR: engaged in picking up rings and moving from one vertical stack of cones to the other with focus on precision pinch and motor control to not touch or knock over the targets.  Completed in standing for dynamic balance and endurance.  Pt taking a rest break between sets after 3-4 mins. Pipe tree puzzle: completed in sitting due to reports of lower back discomfort in standing, with focus on coordination and manipulation of pieces, increased shoulder flexion, and problem solving.  Pt demonstrating good functional use of RUE when picking up and manipulating pieces and no overshooting.  Moist heat: pt reports increased pain after unsupported sitting and standing activity, increasing from mild pain on arrival to 8-9/10.  Pt asking about why she had a stroke, OT educating on signs and symptoms of stroke.  Educated on BE FAST acronym and provided with handout. Pt reports pain reduced to 1 at end of session after use of heat 8-10 mins.   11/11/24  Educated pt in purpose of OT, goals, and POC. Pt in agreement with goals. Educated in energy conservation techniques and completed 1x10 shoulder flexion and horizontal ab/adduction with red light resistance theraband.        PATIENT EDUCATION: Education details: stroke signs and symptoms, coordination and endurance Person educated: Patient Education method: Programmer, Multimedia, Demonstration, Verbal cues, and Handouts Education comprehension: verbalized understanding, returned demonstration, and needs further education  HOME EXERCISE PROGRAM: To be added   GOALS: Goals reviewed with patient? Yes  SHORT TERM GOALS: Target date: 11/25/24  Patient will demonstrate  RUE and LUE HEP with 25% verbal cues or less for proper execution.  Baseline: New to OP OT Goal status: in progress  2.  Pt will verbalize understanding of adapted strategies and/or equipment PRN to increase safety and  independence with ADLs and IADLs (I.e. adaptive cutting board, rocker knife, etc.)  Baseline: Pt has sticky pad for opening jars Goal status: in progress  3.  Pt will be able to place at least 30 blocks using right hand with  completion of Box and Blocks test.  Baseline: Right 26blocks, Left 28blocks Goal status: in progress   LONG TERM GOALS: Target date: 12/19/24  Pt will increase UEFS score to 70/80 to demonstrate improved function Baseline: 66/80 Goal status: in progress  2.  Patient will demonstrate at least 20 lbs R grip strength as needed to open jars and other containers.  Baseline: Right: 17.33 avg lbs; Left: 13 lbs Goal status: in progress  3.  Patient will demo improved FM coordination as evidenced by completing nine-hole peg with use of R hand in 35 seconds or less and L hand in 41 seconds or less.  Baseline:  Right: 38.77 sec; Left: 46.75 sec Goal status: in progress   ASSESSMENT:  CLINICAL IMPRESSION: Patient is a 79 y.o. female who was seen today for occupational therapy evaluation for R sided weakness post CVA. Hx includes  HLD, macular degeneration, nephrolithiasis, hypothyroidism, HTN, fibromyalgia. Pt limited in participation this session due to reports of back pain.  Pt transitioned to supportive sitting with moist heat and educated on signs and symptoms of stroke.  Patient currently presents below baseline level of functioning demonstrating functional deficits and impairments as noted below. Pt will continue to benefit from skilled OT services in the outpatient setting to work on impairments as noted below to help pt return to PLOF as able.     PERFORMANCE DEFICITS: in functional skills including IADLs, strength, pain, endurance, and UE functional use, cognitive skills including sequencing, and psychosocial skills including environmental adaptation, habits, and routines and behaviors.      PLAN:  OT FREQUENCY: 1x/week  OT DURATION: 4 weeks  PLANNED  INTERVENTIONS: 97168 OT Re-evaluation, 97535 self care/ADL training, 02889 therapeutic exercise, 97530 therapeutic activity, 97140 manual therapy, 97035 ultrasound, 97010 moist heat, 97010 cryotherapy, energy conservation, coping strategies training, patient/family education, and DME and/or AE instructions  RECOMMENDED OTHER SERVICES: none at this time  CONSULTED AND AGREED WITH PLAN OF CARE: Patient  PLAN FOR NEXT SESSION: AE education  Putty HEP Continue UE theraband HEP Educate in joint protection   Greenwood, Shawnte Winton, OTR/L 11/25/2024, 10:23 AM   [facility] [address] [place], [place] [address] Phone # [phone] Fax # [phone]         "

## 2024-11-26 ENCOUNTER — Encounter: Payer: Self-pay | Admitting: Neurology

## 2024-11-26 ENCOUNTER — Ambulatory Visit: Payer: Self-pay | Admitting: Neurology

## 2024-11-26 VITALS — BP 141/84 | HR 76 | Ht 60.0 in | Wt 145.0 lb

## 2024-11-26 DIAGNOSIS — I639 Cerebral infarction, unspecified: Secondary | ICD-10-CM | POA: Diagnosis not present

## 2024-11-26 DIAGNOSIS — R258 Other abnormal involuntary movements: Secondary | ICD-10-CM

## 2024-11-26 DIAGNOSIS — M797 Fibromyalgia: Secondary | ICD-10-CM | POA: Diagnosis not present

## 2024-11-26 DIAGNOSIS — E785 Hyperlipidemia, unspecified: Secondary | ICD-10-CM | POA: Diagnosis not present

## 2024-11-26 NOTE — Patient Instructions (Signed)
 I saw you today for your recent stroke. You seem to be doing well. Continue PT and OT.  I want you to continue aspirin  81 mg daily and Nexletol 180 mg daily to reduce the risk of another stroke.  I will see you again in about 6 months to check your progress.  Please let me know if you have any questions or concerns in the meantime.  If you have new difficulty speaking, face droop, numbness on one side of the body, weakness on one side of the body, or dizziness/imbalance, this could be the sign of a stroke. Don't wait, please call EMS and be evaluated at the nearest emergency room.  The physicians and staff at [facility] are committed to providing excellent care. You may receive a survey requesting feedback about your experience at our office. We strive to receive very good responses to the survey questions. If you feel that your experience would prevent you from giving the office a very good  response, please contact our office to try to remedy the situation. We may be reached at [phone]. Thank you for taking the time out of your busy day to complete the survey.  Paula Potters, MD [facility]  Preventing Falls at [facility] are common, often dreaded events in the lives of older people. Aside from the obvious injuries and even death that may result, fall can cause wide-ranging consequences including loss of independence, mental decline, decreased activity and mobility. Younger people are also at risk of falling, especially those with chronic illnesses and fatigue.  Ways to reduce risk for falling Examine diet and medications. Warm foods and alcohol dilate blood vessels, which can lead to dizziness when standing. Sleep aids, antidepressants and pain medications can also increase the likelihood of a fall.  Get a vision exam. Poor vision, cataracts and glaucoma increase the chances of falling.  Check foot gear. Shoes should fit snugly and have a sturdy, nonskid sole and a  broad, low heel  Participate in a physician-approved exercise program to build and maintain muscle strength and improve balance and coordination. Programs that use ankle weights or stretch bands are excellent for muscle-strengthening. Water  aerobics programs and low-impact Tai Chi programs have also been shown to improve balance and coordination.  Increase vitamin D  intake. Vitamin D  improves muscle strength and increases the amount of calcium the body is able to absorb and deposit in bones.  How to prevent falls from common hazards Floors - Remove all loose wires, cords, and throw rugs. Minimize clutter. Make sure rugs are anchored and smooth. Keep furniture in its usual place.  Chairs -- Use chairs with straight backs, armrests and firm seats. Add firm cushions to existing pieces to add height.  Bathroom - Install grab bars and non-skid tape in the tub or shower. Use a bathtub transfer bench or a shower chair with a back support Use an elevated toilet seat and/or safety rails to assist standing from a low surface. Do not use towel racks or bathroom tissue holders to help you stand.  Lighting - Make sure halls, stairways, and entrances are well-lit. Install a night light in your bathroom or hallway. Make sure there is a light switch at the top and bottom of the staircase. Turn lights on if you get up in the middle of the night. Make sure lamps or light switches are within reach of the bed if you have to get up during the night.  Kitchen - Install non-skid rubber mats near  the sink and stove. Clean spills immediately. Store frequently used utensils, pots, pans between waist and eye level. This helps prevent reaching and bending. Sit when getting things out of lower cupboards.  Living room/ Bedrooms - Place furniture with wide spaces in between, giving enough room to move around. Establish a route through the living room that gives you something to hold onto as you walk.  Stairs - Make sure treads,  rails, and rugs are secure. Install a rail on both sides of the stairs. If stairs are a threat, it might be helpful to arrange most of your activities on the lower level to reduce the number of times you must climb the stairs.  Entrances and doorways - Install metal handles on the walls adjacent to the doorknobs of all doors to make it more secure as you travel through the doorway.  Tips for maintaining balance Keep at least one hand free at all times. Try using a backpack or fanny pack to hold things rather than carrying them in your hands. Never carry objects in both hands when walking as this interferes with keeping your balance.  Attempt to swing both arms from front to back while walking. This might require a conscious effort if Parkinson's disease has diminished your movement. It will, however, help you to maintain balance and posture, and reduce fatigue.  Consciously lift your feet off of the ground when walking. Shuffling and dragging of the feet is a common culprit in losing your balance.  When trying to navigate turns, use a U technique of facing forward and making a wide turn, rather than pivoting sharply.  Try to stand with your feet shoulder-length apart. When your feet are close together for any length of time, you increase your risk of losing your balance and falling.  Do one thing at a time. Don't try to walk and accomplish another task, such as reading or looking around. The decrease in your automatic reflexes complicates motor function, so the less distraction, the better.  Do not wear rubber or gripping soled shoes, they might catch on the floor and cause tripping.  Move slowly when changing positions. Use deliberate, concentrated movements and, if needed, use a grab bar or walking aid. Count 15 seconds between each movement. For example, when rising from a seated position, wait 15 seconds after standing to begin walking.  If balance is a continuous problem, you might want  to consider a walking aid such as a cane, walking stick, or walker. Once you've mastered walking with help, you might be ready to try it on your own again.

## 2024-11-27 ENCOUNTER — Ambulatory Visit: Admitting: Physical Therapy

## 2024-11-27 ENCOUNTER — Encounter: Payer: Self-pay | Admitting: Physical Therapy

## 2024-11-27 DIAGNOSIS — R2689 Other abnormalities of gait and mobility: Secondary | ICD-10-CM

## 2024-11-27 DIAGNOSIS — R2681 Unsteadiness on feet: Secondary | ICD-10-CM

## 2024-11-27 DIAGNOSIS — M6281 Muscle weakness (generalized): Secondary | ICD-10-CM

## 2024-11-27 DIAGNOSIS — R278 Other lack of coordination: Secondary | ICD-10-CM | POA: Diagnosis not present

## 2024-11-27 NOTE — Therapy (Signed)
 " OUTPATIENT PHYSICAL THERAPY NEURO TREATMENT   Patient Name: Paula Murphy MRN: [id number] DOB:May 15, 1946, 79 y.o., female Today's Date: 11/27/2024   PCP: Nichole Senior, MD  REFERRING PROVIDER: Cherlyn Labella, MD >F/u with Venetia Potters, MD  END OF SESSION:  PT End of Session - 11/27/24 1023     Visit Number 4    Number of Visits 17    Date for Recertification  01/08/25    Authorization Type [employer]-11/11/24-01/08/25    Authorization - Visit Number 4    Authorization - Number of Visits 17    Progress Note Due on Visit 10    PT Start Time 1024   pt arrives a little late   PT Stop Time 1104    PT Time Calculation (min) 40 min    Equipment Utilized During Treatment Gait belt    Activity Tolerance Patient tolerated treatment well    Behavior During Therapy WFL for tasks assessed/performed             Past Medical History:  Diagnosis Date   Arthritis    OA AND PAIN RT KNEE   Autoimmune disease    + ANA, and DS DNA--PT STATES SHE WAS TOLD SHE DOES NOT HAVE LUPUS AS PREVIOUSLY THOUGHT   Fibromyalgia    followed by Dr. 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   on face   TONSILLECTOMY  11/06/1951   TOTAL KNEE ARTHROPLASTY Right 03/02/2013   Procedure: RIGHT TOTAL KNEE ARTHROPLASTY;  Surgeon: Dempsey LULLA Moan, MD;  Location: [facility];  Service: Orthopedics;  Laterality: Right;   TOTAL KNEE ARTHROPLASTY Left 10/10/2020   Procedure: TOTAL KNEE ARTHROPLASTY;  Surgeon: Moan Dempsey,  MD;  Location: [facility];  Service: Orthopedics;  Laterality: Left;    Patient Active Problem List   Diagnosis Date Noted   Right sided weakness 10/20/2024   Acute ischemic stroke (HCC) 10/20/2024   TIA (transient ischemic attack) 10/19/2024   Infarction of left basal ganglia (HCC) 10/19/2024   OP (osteoporosis) 12/26/2023   Primary osteoarthritis of left knee 10/10/2020   Ingrown toenail 02/02/2019   Primary osteoarthritis of both feet 03/21/2017   History of macular degeneration 03/21/2017   Other fatigue 03/21/2017   Age-related osteoporosis without current pathological fracture 03/21/2017   Esophageal reflux 06/29/2013   Actinic keratosis 04/11/2013   OA (osteoarthritis) of knee 03/02/2013   DEPRESSION 07/24/2010   Major depressive disorder, single episode, unspecified 07/24/2010   UTI 07/04/2010   ABDOMINAL BLOATING 01/20/2010   Fibromyalgia 10/04/2009   INSOMNIA, CHRONIC 10/04/2009   Autoimmune disease 10/04/2009   Vitamin D  deficiency 10/26/2008   Hypothyroidism 08/13/2008   Mixed hyperlipidemia 08/13/2008   Essential hypertension 08/13/2008   Headache 08/13/2008   Muscle pain 08/12/2008    ONSET DATE: 10/28/2024 MD referral/10/19/2024 TIA  REFERRING DIAG:  R53.1 (ICD-10-CM) - Right sided weakness  R29.810 (ICD-10-CM) - Facial droop  THERAPY DIAG:  Muscle weakness (generalized)  Other abnormalities of gait and mobility  Unsteadiness on feet  Rationale for Evaluation and Treatment: Rehabilitation  SUBJECTIVE:                                                                                                                                                                                             SUBJECTIVE STATEMENT: Feeling very tired since last week's session.  Have done a lot of walking; went to neurologist yesterday and he was pleased.  Pt accompanied by: self  PERTINENT HISTORY: history of osteoarthritis, fibromyalgia, hyperlipidemia,  hypothyroidism, degenerative disc disease, depression, nephrolithiasis. Patient presented secondary to right-sided weakness with initial concern for TIA versus stroke. Hx of L sided weakness from Covid (per pt report) PAIN:  Are you having pain? It's just about gone  PRECAUTIONS: Fall  RED FLAGS: None   WEIGHT BEARING RESTRICTIONS: No  FALLS: Has patient fallen in last 6 months? No  LIVING ENVIRONMENT: Lives with: lives with their family and lives alone Has supportive neighbors and friends Lives in: House/apartment Stairs: No Has following equipment at home: Single point cane, Environmental Consultant - 2 wheeled, Environmental Consultant - 4 wheeled, shower chair, Grab bars, and walking stick  PLOF: Independent and Leisure: enjoys going to [employer], travelling to [place]  PATIENT GOALS: To be able to walk better for upcoming beach trip, to be able to walk better for grandson's wedding in May  OBJECTIVE:     TODAY'S TREATMENT: 11/27/2024 Activity Comments  Seated leg exercises: -ankle pumps x 1 min -LAQ x 1 min -seated march x 1 min -forward heel taps/knee bends For seated warm up  Standing at counter: Sidestepping x 3 reps Forward/back walking at counter 5 reps Progressing to less UE support  Standing heel/toe raises 2 x 10   Standing feet apart/together EO and EC head turns/nods Light/intermittent UE support  Sit to stand between activities, no UE support Gait mat<>counter, 3 x 10 ft with no device, close supervision   Standing march in place Forward step taps Side step taps Back step taps 10 reps, alternating legs No UE support, min guard    TREATMENT: 11/25/24 Activity Comments  Nustep L4 x 6 min UEs/LEs  Dynamic warm up   review of HEP updates: STS 10x  alt step taps Cueing to achieve TKE with STS. Able to perform taps without UE support   STS with 7# 5x Good ability  alt backwards steps Backwards walking  alt side step Sidestep over obstacles  Romberg EO/EC, then added head turns/nods   Tandem balance  At TM rail. Cueing for longer L step back.  Able to perform sidestep with 1 UE support. Required short sit breaks d/t fatigue and some dizziness which dissipated with short water  break. Most imbalance with tandem balance, requiring light CGA-min A             PATIENT EDUCATION: Education details: Additions to HEP Person educated: Patient Education method: Explanation, Demonstration, and Handouts Education comprehension: verbalized understanding, returned demonstration, and needs further education  Access Code: [email or web address] URL: [email or web address] Date: 11/27/2024 Prepared by: [facility] - Outpatient  Rehab - [facility]  Program Notes While walking with your walker, make sure to walk with heel to toe pattern, taking long strides.  Exercises - Seated March  - 1 x daily - 7 x weekly - 3 sets - 10 reps - Seated Long Arc Quad  - 1 x daily - 7 x weekly - 3 sets - 10 reps - Seated Heel Toe Raises  - 1 x daily - 7 x weekly - 3 sets - 10 reps - Sit to Stand  - 1-2 x daily - 7 x weekly - 3 sets - 5 reps - Alternating Step Taps with Counter Support  - 1 x daily - 5 x weekly - 2 sets - 10 reps - Wide Stance with Counter Support  - 1 x daily - 5 x weekly - 1-2 sets - 5 reps - Narrow Stance with Counter Support  - 1 x daily - 5 x weekly - 1-2 sets - 5 reps   ----------------------------- Note: Objective measures were completed at Evaluation unless otherwise noted.  DIAGNOSTIC FINDINGS: MRI confirms a left basal ganglia stroke.   COGNITION: Overall cognitive status: Within functional limits for tasks assessed   SENSATION: Light touch: WFL Reports increased pain due to fibromyalgia  COORDINATION: WFL  EDEMA:  Some edema LLE, that is longstanding (reports MDs are aware, just not sure what causes it)  POSTURE: rounded shoulders, forward head, and flexed trunk  In standing, has knees flexed LOWER EXTREMITY ROM:   WFL  Active  Right Eval Left Eval   Hip flexion    Hip extension    Hip abduction    Hip adduction    Hip internal rotation    Hip external rotation    Knee flexion    Knee extension    Ankle dorsiflexion    Ankle plantarflexion    Ankle inversion    Ankle eversion     (Blank rows = not tested)  LOWER EXTREMITY MMT:    MMT Right Eval Left Eval  Hip flexion 4 4  Hip extension    Hip abduction 4 4  Hip adduction 4 4  Hip internal rotation    Hip external rotation    Knee flexion 4 4  Knee extension 4 4  Ankle dorsiflexion 4 4  Ankle plantarflexion    Ankle inversion    Ankle eversion    (Blank rows = not tested)  TRANSFERS: Sit to stand: SBA and CGA  Assistive device utilized: None     Stand to sit: SBA and CGA  Assistive device utilized: None      GAIT: Findings: Gait Characteristics: step through pattern, decreased step length- Right, decreased step length- Left, decreased ankle dorsiflexion- Right, decreased ankle dorsiflexion- Left, shuffling, poor foot clearance- Right, and poor foot clearance- Left, Distance walked: 50 ft, Assistive device utilized:Walker - 4 wheeled, and Level of assistance: SBA Pt bumps into furniture, wall/doorways on R, especially with turning FUNCTIONAL TESTS:  5 times sit to stand: 27.5 sec  arms crossed at chest Timed up and go (TUG): 25.1 sec 10 meter walk test: 32.62 sec= 1 ft/sec Berg Balance test:  39/56                                                                                                                                TREATMENT DATE: 11/11/2024    PATIENT EDUCATION: Education details: PT eval results, POC, initial HEP and cues for heelstrike with gait (to not hear the scuffing/shuffling) Person educated: Patient Education method: Explanation, Demonstration, Verbal cues, and Handouts Education comprehension: verbalized understanding, returned demonstration, and needs further education  HOME EXERCISE PROGRAM: Access Code: [email or web address] URL:  [email or web address] Date: 11/11/2024 Prepared by: [facility] - Outpatient  Rehab - [facility]  Program Notes While walking with your walker, make sure to walk with heel to toe pattern, taking long strides.  Exercises - Seated March  - 1 x daily - 7 x weekly - 3 sets - 10 reps - Seated Long Arc Quad  - 1 x daily - 7 x weekly - 3 sets - 10 reps - Seated Heel Toe Raises  - 1 x daily - 7 x weekly - 3 sets - 10 reps  GOALS: Goals reviewed with patient? Yes  SHORT TERM GOALS: Target date: 12/11/2024  Pt will be independent with HEP for improved strength, balance, gait. Baseline: Goal status: INITIAL  2.  Pt will improve 5x sit<>stand to less than or equal to 20 sec to demonstrate improved functional strength and transfer efficiency. Baseline: 27.5 sec Goal status: INITIAL  3.  Pt will improve Berg score to at least 45/56 to decrease fall risk. Baseline: 39/56 Goal status: INITIAL   LONG TERM GOALS: Target date: 01/08/2025  Pt will be independent with HEP for improved balance, strength, gait. Baseline:  Goal status: INITIAL  2.  Pt will improve 5x sit<>stand to less than or equal to 15 sec to demonstrate improved functional strength and transfer efficiency. Baseline: 27.5 sec Goal status: INITIAL  3.  Pt will improve TUG score to less than or equal to 15 sec for decreased fall risk. Baseline: 25.1 sec Goal status: INITIAL  4.  Pt will improve gait velocity to at least 2.62 ft/sec for improved gait efficiency and safety. Baseline: 1 ft/sec with 4WW Goal status: INITIAL  5.  Pt will ambulate at least 500 ft, indoor and outdoor surfaces, mod I for improved gait efficiency and safety in the community. Baseline:  Goal status: INITIAL   ASSESSMENT:  CLINICAL IMPRESSION: Pt presents today with reports of overall fatigue from MD visits and exercises. Skilled PT session focused on gentle seated warm up exercises + standing balance activities; also worked on  transitions with sit to stand and short distance gait without device, with close supervision.  Pt is able to progressively lessen UE support with standing balance exercises and has some very mild sway with EC/head motion exercises; able to add to HEP.  Pt will continue to benefit from skilled PT towards goals for improved functional mobility and decreased fall risk.   OBJECTIVE IMPAIRMENTS: Abnormal gait, decreased activity tolerance, decreased balance, decreased mobility, difficulty walking, decreased strength, and postural dysfunction.   ACTIVITY LIMITATIONS: carrying, lifting, bending, standing, squatting, transfers, reach over head, and locomotion level  PARTICIPATION LIMITATIONS: meal prep, cleaning, laundry, driving, shopping, and community activity  PERSONAL FACTORS: 3+ comorbidities: see PMH above are also affecting patient's functional outcome.   REHAB POTENTIAL: Good  CLINICAL DECISION MAKING: Evolving/moderate complexity  EVALUATION COMPLEXITY: Moderate  PLAN:  PT FREQUENCY: 2x/week  PT DURATION: 8 weeks plus eval visit  PLANNED INTERVENTIONS: 97750- Physical Performance Testing, 97110-Therapeutic exercises, 97530- Therapeutic activity, 97112- Neuromuscular re-education, 97535- Self Care, [id number]- Manual therapy, [phone]- Gait training, Patient/Family education, and Balance training  PLAN FOR NEXT SESSION: Review additions to HEP and progress for standing balance, sit to stand/functional strengthening and progression to more independent gait; may need to utilize Nustep to help with flexibility, strength, endurance   Greig Anon, PT 11/27/24 10:24 AM Phone: [phone] Fax: [phone]   [facility] [address], [address] [place], [place] [address] Phone # [phone] Fax # [phone]        "

## 2024-12-01 ENCOUNTER — Other Ambulatory Visit: Payer: Self-pay

## 2024-12-01 MED ORDER — TOPIRAMATE 50 MG PO TABS: 150.0000 mg | ORAL_TABLET | Freq: Every day | ORAL | 0 refills | Status: AC

## 2024-12-01 NOTE — Telephone Encounter (Signed)
 Received refill request via fax from [facility] for topamax .   Last Fill: 04/15/2024  Next Visit: 03/23/2025  Last Visit: 09/23/2024  DX: Fibromyalgia   Current Dose per office note on 09/23/2024: not mentioned.   Okay to refill topamax ?

## 2024-12-02 ENCOUNTER — Ambulatory Visit

## 2024-12-02 ENCOUNTER — Ambulatory Visit: Admitting: Occupational Therapy

## 2024-12-02 DIAGNOSIS — R278 Other lack of coordination: Secondary | ICD-10-CM | POA: Diagnosis not present

## 2024-12-02 DIAGNOSIS — M6281 Muscle weakness (generalized): Secondary | ICD-10-CM

## 2024-12-02 DIAGNOSIS — R2681 Unsteadiness on feet: Secondary | ICD-10-CM

## 2024-12-02 DIAGNOSIS — R2689 Other abnormalities of gait and mobility: Secondary | ICD-10-CM

## 2024-12-02 DIAGNOSIS — R29898 Other symptoms and signs involving the musculoskeletal system: Secondary | ICD-10-CM

## 2024-12-02 NOTE — Therapy (Signed)
 " OUTPATIENT PHYSICAL THERAPY NEURO TREATMENT   Patient Name: Paula Murphy MRN: [id number] DOB:1946-04-21, 79 y.o., female Today's Date: 12/02/2024   PCP: Nichole Senior, MD  REFERRING PROVIDER: Cherlyn Labella, MD >F/u with Venetia Potters, MD  END OF SESSION:  PT End of Session - 12/02/24 1020     Visit Number 5    Number of Visits 17    Date for Recertification  01/08/25    Authorization Type [employer]-11/11/24-01/08/25    Authorization - Visit Number 5    Authorization - Number of Visits 17    Progress Note Due on Visit 10    PT Start Time 1018    PT Stop Time 1100    PT Time Calculation (min) 42 min    Equipment Utilized During Treatment Gait belt    Activity Tolerance Patient tolerated treatment well    Behavior During Therapy WFL for tasks assessed/performed             Past Medical History:  Diagnosis Date   Arthritis    OA AND PAIN RT KNEE   Autoimmune disease    + ANA, and DS DNA--PT STATES SHE WAS TOLD SHE DOES NOT HAVE LUPUS AS PREVIOUSLY THOUGHT   Fibromyalgia    followed by Dr. 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   on face   TONSILLECTOMY  11/06/1951   TOTAL KNEE ARTHROPLASTY Right 03/02/2013   Procedure: RIGHT TOTAL KNEE ARTHROPLASTY;  Surgeon: Dempsey LULLA Moan, MD;  Location: [facility];  Service: Orthopedics;  Laterality: Right;   TOTAL KNEE ARTHROPLASTY Left 10/10/2020   Procedure: TOTAL KNEE ARTHROPLASTY;  Surgeon: Moan Dempsey, MD;  Location: [facility];   Service: Orthopedics;  Laterality: Left;    Patient Active Problem List   Diagnosis Date Noted   Right sided weakness 10/20/2024   Acute ischemic stroke (HCC) 10/20/2024   TIA (transient ischemic attack) 10/19/2024   Infarction of left basal ganglia (HCC) 10/19/2024   OP (osteoporosis) 12/26/2023   Primary osteoarthritis of left knee 10/10/2020   Ingrown toenail 02/02/2019   Primary osteoarthritis of both feet 03/21/2017   History of macular degeneration 03/21/2017   Other fatigue 03/21/2017   Age-related osteoporosis without current pathological fracture 03/21/2017   Esophageal reflux 06/29/2013   Actinic keratosis 04/11/2013   OA (osteoarthritis) of knee 03/02/2013   DEPRESSION 07/24/2010   Major depressive disorder, single episode, unspecified 07/24/2010   UTI 07/04/2010   ABDOMINAL BLOATING 01/20/2010   Fibromyalgia 10/04/2009   INSOMNIA, CHRONIC 10/04/2009   Autoimmune disease 10/04/2009   Vitamin D  deficiency 10/26/2008   Hypothyroidism 08/13/2008   Mixed hyperlipidemia 08/13/2008   Essential hypertension 08/13/2008   Headache 08/13/2008   Muscle pain 08/12/2008    ONSET DATE: 10/28/2024 MD referral/10/19/2024 TIA  REFERRING DIAG:  R53.1 (ICD-10-CM) - Right sided weakness  R29.810 (ICD-10-CM) - Facial droop    THERAPY DIAG:  Muscle weakness (  generalized)  Other abnormalities of gait and mobility  Unsteadiness on feet  Other symptoms and signs involving the musculoskeletal system  Rationale for Evaluation and Treatment: Rehabilitation  SUBJECTIVE:                                                                                                                                                                                             SUBJECTIVE STATEMENT: Doing ok, no new issues  Pt accompanied by: self  PERTINENT HISTORY: history of osteoarthritis, fibromyalgia, hyperlipidemia, hypothyroidism, degenerative disc disease, depression, nephrolithiasis.  Patient presented secondary to right-sided weakness with initial concern for TIA versus stroke. Hx of L sided weakness from Covid (per pt report) PAIN:  Are you having pain? It's just about gone  PRECAUTIONS: Fall  RED FLAGS: None   WEIGHT BEARING RESTRICTIONS: No  FALLS: Has patient fallen in last 6 months? No  LIVING ENVIRONMENT: Lives with: lives with their family and lives alone Has supportive neighbors and friends Lives in: House/apartment Stairs: No Has following equipment at home: Single point cane, Environmental Consultant - 2 wheeled, Environmental Consultant - 4 wheeled, shower chair, Grab bars, and walking stick  PLOF: Independent and Leisure: enjoys going to [employer], travelling to [employer]  PATIENT GOALS: To be able to walk better for upcoming beach trip, to be able to walk better for grandson's wedding in May  OBJECTIVE:    TODAY'S TREATMENT: 12/02/24 Activity Comments  NU-step resistance intervals x 8 min 2 min warm-up level 5 30 sec heavy (L8); 60 sec light (L4)  Sidestepping x 60 sec BUE support   Lateral step and reach Forward step and reach Lateral step and overhead reach Clipping clothespins to scarves  Reactive balance on firm and foam surfaces   Static multisensory balance On foam surface with emphasis on increased sway for limits of stability, greater difficulty w/ ant-post vs lateral        TODAY'S TREATMENT: 11/27/2024 Activity Comments  Seated leg exercises: -ankle pumps x 1 min -LAQ x 1 min -seated march x 1 min -forward heel taps/knee bends For seated warm up  Standing at counter: Sidestepping x 3 reps Forward/back walking at counter 5 reps Progressing to less UE support  Standing heel/toe raises 2 x 10   Standing feet apart/together EO and EC head turns/nods Light/intermittent UE support  Sit to stand between activities, no UE support Gait mat<>counter, 3 x 10 ft with no device, close supervision   Standing march in place Forward step taps Side step taps Back step taps  10 reps, alternating legs No UE support, min guard      PATIENT EDUCATION: Education details:  Additions to HEP Person educated: Patient Education method: Explanation, Demonstration, and Handouts Education comprehension: verbalized understanding, returned demonstration, and needs further education  Access Code: [email or web address] URL: [email or web address] Date: 11/27/2024 Prepared by: [facility] - Outpatient  Rehab - [facility]  Program Notes While walking with your walker, make sure to walk with heel to toe pattern, taking long strides.  Exercises - Seated March  - 1 x daily - 7 x weekly - 3 sets - 10 reps - Seated Long Arc Quad  - 1 x daily - 7 x weekly - 3 sets - 10 reps - Seated Heel Toe Raises  - 1 x daily - 7 x weekly - 3 sets - 10 reps - Sit to Stand  - 1-2 x daily - 7 x weekly - 3 sets - 5 reps - Alternating Step Taps with Counter Support  - 1 x daily - 5 x weekly - 2 sets - 10 reps - Wide Stance with Counter Support  - 1 x daily - 5 x weekly - 1-2 sets - 5 reps - Narrow Stance with Counter Support  - 1 x daily - 5 x weekly - 1-2 sets - 5 reps   ----------------------------- Note: Objective measures were completed at Evaluation unless otherwise noted.  DIAGNOSTIC FINDINGS: MRI confirms a left basal ganglia stroke.   COGNITION: Overall cognitive status: Within functional limits for tasks assessed   SENSATION: Light touch: WFL Reports increased pain due to fibromyalgia  COORDINATION: WFL  EDEMA:  Some edema LLE, that is longstanding (reports MDs are aware, just not sure what causes it)  POSTURE: rounded shoulders, forward head, and flexed trunk  In standing, has knees flexed LOWER EXTREMITY ROM:   WFL  Active  Right Eval Left Eval  Hip flexion    Hip extension    Hip abduction    Hip adduction    Hip internal rotation    Hip external rotation    Knee flexion    Knee extension    Ankle dorsiflexion    Ankle plantarflexion    Ankle  inversion    Ankle eversion     (Blank rows = not tested)  LOWER EXTREMITY MMT:    MMT Right Eval Left Eval  Hip flexion 4 4  Hip extension    Hip abduction 4 4  Hip adduction 4 4  Hip internal rotation    Hip external rotation    Knee flexion 4 4  Knee extension 4 4  Ankle dorsiflexion 4 4  Ankle plantarflexion    Ankle inversion    Ankle eversion    (Blank rows = not tested)  TRANSFERS: Sit to stand: SBA and CGA  Assistive device utilized: None     Stand to sit: SBA and CGA  Assistive device utilized: None      GAIT: Findings: Gait Characteristics: step through pattern, decreased step length- Right, decreased step length- Left, decreased ankle dorsiflexion- Right, decreased ankle dorsiflexion- Left, shuffling, poor foot clearance- Right, and poor foot clearance- Left, Distance walked: 50 ft, Assistive device utilized:Walker - 4 wheeled, and Level of assistance: SBA Pt bumps into furniture, wall/doorways on R, especially with turning FUNCTIONAL TESTS:  5 times sit to stand: 27.5 sec arms crossed at chest Timed up and go (TUG): 25.1 sec 10 meter walk test: 32.62 sec= 1 ft/sec Berg Balance test:  39/56  TREATMENT DATE: 11/11/2024    PATIENT EDUCATION: Education details: PT eval results, POC, initial HEP and cues for heelstrike with gait (to not hear the scuffing/shuffling) Person educated: Patient Education method: Explanation, Demonstration, Verbal cues, and Handouts Education comprehension: verbalized understanding, returned demonstration, and needs further education  HOME EXERCISE PROGRAM: Access Code: [email or web address] URL: [email or web address] Date: 11/11/2024 Prepared by: [facility] - Outpatient  Rehab - [facility]  Program Notes While walking with your walker, make sure to walk with heel to toe pattern, taking long  strides.  Exercises - Seated March  - 1 x daily - 7 x weekly - 3 sets - 10 reps - Seated Long Arc Quad  - 1 x daily - 7 x weekly - 3 sets - 10 reps - Seated Heel Toe Raises  - 1 x daily - 7 x weekly - 3 sets - 10 reps  GOALS: Goals reviewed with patient? Yes  SHORT TERM GOALS: Target date: 12/11/2024  Pt will be independent with HEP for improved strength, balance, gait. Baseline: Goal status: INITIAL  2.  Pt will improve 5x sit<>stand to less than or equal to 20 sec to demonstrate improved functional strength and transfer efficiency. Baseline: 27.5 sec Goal status: INITIAL  3.  Pt will improve Berg score to at least 45/56 to decrease fall risk. Baseline: 39/56 Goal status: INITIAL   LONG TERM GOALS: Target date: 01/08/2025  Pt will be independent with HEP for improved balance, strength, gait. Baseline:  Goal status: INITIAL  2.  Pt will improve 5x sit<>stand to less than or equal to 15 sec to demonstrate improved functional strength and transfer efficiency. Baseline: 27.5 sec Goal status: INITIAL  3.  Pt will improve TUG score to less than or equal to 15 sec for decreased fall risk. Baseline: 25.1 sec Goal status: INITIAL  4.  Pt will improve gait velocity to at least 2.62 ft/sec for improved gait efficiency and safety. Baseline: 1 ft/sec with 4WW Goal status: INITIAL  5.  Pt will ambulate at least 500 ft, indoor and outdoor surfaces, mod I for improved gait efficiency and safety in the community. Baseline:  Goal status: INITIAL   ASSESSMENT:  CLINICAL IMPRESSION: Initiated with NU-step to perform larger amplitude movements with varied resistance and maintained good carryover for full ROM throughout. Weight shifting and activities to foster larger amplitude steps to improve safety with single limb support and reaching outside BOS to improve safety with ADL/housekeeping with good control and stability throughout although notably less comfortable/confident with large  forward/backward steps vs lateral tending to take smaller step and prefer UE support for these directions. With ambulation not using AD takes very small step length and limited left foot clearance in swing noted.  Continued sessions to advance POC details to improve mobility and reduce use of AD per pt goal   OBJECTIVE IMPAIRMENTS: Abnormal gait, decreased activity tolerance, decreased balance, decreased mobility, difficulty walking, decreased strength, and postural dysfunction.   ACTIVITY LIMITATIONS: carrying, lifting, bending, standing, squatting, transfers, reach over head, and locomotion level  PARTICIPATION LIMITATIONS: meal prep, cleaning, laundry, driving, shopping, and community activity  PERSONAL FACTORS: 3+ comorbidities: see PMH above are also affecting patient's functional outcome.   REHAB POTENTIAL: Good  CLINICAL DECISION MAKING: Evolving/moderate complexity  EVALUATION COMPLEXITY: Moderate  PLAN:  PT FREQUENCY: 2x/week  PT DURATION: 8 weeks plus eval visit  PLANNED INTERVENTIONS: 97750- Physical Performance Testing, 97110-Therapeutic exercises, 97530- Therapeutic activity, [id number]- Neuromuscular re-education, 97535- Self Care, [id number]- Manual therapy, [id number]- Gait training,  Patient/Family education, and Balance training  PLAN FOR NEXT SESSION: Review additions to HEP and progress for standing balance, sit to stand/functional strengthening and progression to more independent gait; may need to utilize Nustep to help with flexibility, strength, endurance. Gait training w/ cane   11:23 AM, 12/02/24 M. Kelly Lemond Griffee, PT, DPT Physical Therapist- [place] Office Number: [phone] 1        "

## 2024-12-02 NOTE — Therapy (Signed)
 " OUTPATIENT OCCUPATIONAL THERAPY NEURO Treatment Note  Patient Name: Paula Murphy MRN: [id number] DOB:09-02-1946, 79 y.o., female Today's Date: 12/02/2024  PCP: Nichole Senior, MD REFERRING PROVIDER: Cherlyn Labella, MD  END OF SESSION:  OT End of Session - 12/02/24 1153     Visit Number 3    Number of Visits 5   including eval   Date for Recertification  12/19/24    Authorization Type [employer] MCR-auth required    Authorization Time Period Auth#: [id number], approved 5 OT visits from 11/11/24-12/19/24.    OT Start Time 1103    OT Stop Time 1146    OT Time Calculation (min) 43 min    Equipment Utilized During Treatment red theraband    Activity Tolerance Patient tolerated treatment well    Behavior During Therapy WFL for tasks assessed/performed            Past Medical History:  Diagnosis Date   Arthritis    OA AND PAIN RT KNEE   Autoimmune disease    + ANA, and DS DNA--PT STATES SHE WAS TOLD SHE DOES NOT HAVE LUPUS AS PREVIOUSLY THOUGHT   Fibromyalgia    followed by Dr. 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   on face   TONSILLECTOMY  11/06/1951   TOTAL KNEE ARTHROPLASTY Right 03/02/2013   Procedure: RIGHT TOTAL KNEE ARTHROPLASTY;  Surgeon: Dempsey LULLA Moan, MD;  Location: [facility];  Service: Orthopedics;  Laterality: Right;   TOTAL KNEE ARTHROPLASTY Left 10/10/2020   Procedure: TOTAL KNEE ARTHROPLASTY;  Surgeon: Moan Dempsey, MD;  Location: [facility];  Service: Orthopedics;   Laterality: Left;    Patient Active Problem List   Diagnosis Date Noted   Right sided weakness 10/20/2024   Acute ischemic stroke (HCC) 10/20/2024   TIA (transient ischemic attack) 10/19/2024   Infarction of left basal ganglia (HCC) 10/19/2024   OP (osteoporosis) 12/26/2023   Primary osteoarthritis of left knee 10/10/2020   Ingrown toenail 02/02/2019   Primary osteoarthritis of both feet 03/21/2017   History of macular degeneration 03/21/2017   Other fatigue 03/21/2017   Age-related osteoporosis without current pathological fracture 03/21/2017   Esophageal reflux 06/29/2013   Actinic keratosis 04/11/2013   OA (osteoarthritis) of knee 03/02/2013   DEPRESSION 07/24/2010   Major depressive disorder, single episode, unspecified 07/24/2010   UTI 07/04/2010   ABDOMINAL BLOATING 01/20/2010   Fibromyalgia 10/04/2009   INSOMNIA, CHRONIC 10/04/2009   Autoimmune disease 10/04/2009   Vitamin D  deficiency 10/26/2008   Hypothyroidism 08/13/2008   Mixed hyperlipidemia 08/13/2008   Essential hypertension 08/13/2008   Headache 08/13/2008   Muscle pain 08/12/2008    ONSET DATE: 10/28/2024 referral date, 10/19/24-10/28/24 hospitalization  REFERRING DIAG: R53.1 (ICD-10-CM) - Right sided weakness R29.810 (ICD-10-CM) - Facial droop  THERAPY DIAG:  Muscle weakness (generalized)  Other lack of coordination  Rationale for Evaluation and Treatment: Rehabilitation  SUBJECTIVE:   SUBJECTIVE STATEMENT:  Pt reports that she stayed with her daughter over the snow due to concern of power outages.    Pt reports still having difficulty with holding spoon and writing utensil.    Pt accompanied by: self  PERTINENT HISTORY: MRI(+) L basal ganglia CVA. NIH 0 PMH: HLD, macular degeneration, nephrolithiasis, hypothyroidism, HTN, fibromyalgia.   PRECAUTIONS: Other: R sided weakness, blood thinners  WEIGHT BEARING RESTRICTIONS: No  PAIN:  Are you having pain? No  FALLS: Has patient fallen in  last 6 months? No  LIVING ENVIRONMENT: Lives with: lives alone Lives in: House/apartment Stairs: No Has following equipment at home: Single point cane, Walker - 4 wheeled, shower chair, Grab bars, and walking stick and 2 more rollators  PLOF: Independent and Independent with household mobility without device worked at [employer]  PATIENT GOALS: I'm going to [employer] the early part of February and my grandson is getting married in May. I want to be able to walk and be as self sufficient as possible for those things.  OBJECTIVE:  Note: Objective measures were completed at Evaluation unless otherwise noted.  HAND DOMINANCE: Right  ADLs: Overall ADLs: Pt reports no troubles with any ADLs except shower/tub transfers. She has a friend who supervises her transfers to/from shower/tub combo. Transfers/ambulation related to ADLs: Eating: I Grooming: I UB Dressing: I LB Dressing: I Toileting: I Bathing: I Tub Shower transfers: SPV/SBA Equipment: Shower seat without back and Grab bars  IADLs: Shopping: Hasn't attempted since stroke, friend or daughter delivering groceries. Light housekeeping: Hired help PLOF once a month, pt was  Meal Prep: Hasn't since stroke Community mobility: Currently not driving  Medication management: Independent Financial management: Independent Handwriting: Pt reports slight changes but that it is getting better every day.  MOBILITY STATUS: Using rollator for functional mobility at this time and noted shuffling gait  POSTURE COMMENTS:  No Significant postural limitations Sitting balance: WFL  ACTIVITY TOLERANCE: Activity tolerance: Not walking as long but pt reports this is d/t fibromyalgia  FUNCTIONAL OUTCOME MEASURES: Upper Extremity Functional Scale (UEFS): 66/80  UPPER EXTREMITY ROM:  4/5 RUE grossly, 4+/5 LUE  Active ROM Right eval Left eval  Shoulder flexion    Shoulder abduction    Shoulder adduction    Shoulder extension     Shoulder internal rotation    Shoulder external rotation    Elbow flexion    Elbow extension    Wrist flexion    Wrist extension    Wrist ulnar deviation    Wrist radial deviation    Wrist pronation    Wrist supination    (Blank rows = not tested)  UPPER EXTREMITY MMT:     MMT Right eval Left eval  Shoulder flexion    Shoulder abduction    Shoulder adduction    Shoulder extension    Shoulder internal rotation    Shoulder external rotation    Middle trapezius    Lower trapezius    Elbow flexion    Elbow extension    Wrist flexion    Wrist extension    Wrist ulnar deviation    Wrist radial deviation    Wrist pronation    Wrist supination    (Blank rows = not tested)  HAND FUNCTION: Grip strength: Right: 17.33 avg lbs; Left: 13 lbs  COORDINATION: 9 Hole Peg test: Right: 38.77 sec; Left: 46.75 sec Box and Blocks:  Right 26blocks, Left 28blocks  SENSATION: WFL  EDEMA: none  MUSCLE TONE: RUE: Within functional limits and  LUE: Within functional limits  COGNITION: Overall cognitive status: Within functional limits for tasks assessed  VISION: Subjective report: I've been to the eye doctor and he said the stroke didn't affect my vision Baseline vision: Wears glasses for reading only Visual history: cataracts  VISION ASSESSMENT: Not tested  Patient has difficulty with following activities due to following visual impairments: none  PERCEPTION: WFL  PRAXIS: WFL  OBSERVATIONS: impaired hand strength (secondary to fibromyalgia), decline in ability to complete IADL and transfer to/from shower/tub combo, impaired UE strength                                                                                                                             TREATMENT DATE:  12/02/24 Coordination: engaged in picking up 4-5 Connect 4 pieces in hand and translating from palm to finger tips to place into Connect 4 grid.  Pt demonstrating ability to complete, stating that it  feels like her thumb is the problem.   AE/DME:  OT educated on use of built up handles with self-feeding and handwriting.  Pt completing handwriting and preferring use of larger standard pen including pen grip to allow for increased ease of sustained grasp and manipulation.  OT also educating on potential benefit of purchasing some spoons with larger handles to allow for increased grasp and manipulation.   Putty: engaged in full hand grip, thumb opposition, and removing stones from yellow therapy putty.  OT educating on carryover to improved grip and pinch strength as well as coordination as needed for handwriting and self-feeding.   Coins: OT educating on additional coordination tasks including picking up and stacking coins, translation of coins from palm of hand to finger tips, and rotating coin in finger tips.  OT educating on carryover to other functional tasks.    11/25/24 NMR: engaged in picking up rings and moving from one vertical stack of cones to the other with focus on precision pinch and motor control to not touch or knock over the targets.  Completed in standing for dynamic balance and endurance.  Pt taking a rest break between sets after 3-4 mins. Pipe tree puzzle: completed in sitting due to reports of lower back discomfort in standing, with focus on coordination and manipulation of pieces, increased shoulder flexion, and problem solving.  Pt demonstrating good functional use of RUE when picking up and manipulating pieces and no overshooting.  Moist heat: pt reports increased pain after unsupported sitting and standing activity, increasing from mild pain on arrival to 8-9/10.  Pt asking about why she had a stroke, OT educating on signs and symptoms of stroke.  Educated on BE FAST acronym and provided with handout. Pt reports pain reduced to 1 at end of session after use of heat 8-10 mins.   11/11/24  Educated pt in purpose of OT, goals, and POC. Pt in agreement with goals. Educated in  energy conservation techniques and completed 1x10 shoulder flexion and horizontal ab/adduction with red light  resistance theraband.        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 Person educated: Patient Education method: Explanation, Demonstration, Verbal cues, and Handouts Education comprehension: verbalized understanding, returned demonstration, and needs further education  HOME EXERCISE PROGRAM: Access Code: [email or web address] URL: [email or web address] Date: 12/03/2024 Prepared by: [facility] Outpatient  Re[facility] - [facility]  Exercises - Putty Squeezes  - 2 x daily - 10 reps - Thumb Opposition with Putty  - 2 x daily - 10 reps - 3-Point Pinch with Putty  - 2 x daily - 10 reps - Finger Pinch and Pull with Putty  - 2 x daily - 10 reps - Removing Marbles from Putty  - 1 x daily - 3 reps  12/02/24 - handwritten coordination tasks with coins   GOALS: Goals reviewed with patient? Yes  SHORT TERM GOALS: Target date: 11/25/24  Patient will demonstrate  RUE and LUE HEP with 25% verbal cues or less for proper execution.  Baseline: New to OP OT Goal status: in progress  2.  Pt will verbalize understanding of adapted strategies and/or equipment PRN to increase safety and independence with ADLs and IADLs (I.e. adaptive cutting board, rocker knife, etc.)  Baseline: Pt has sticky pad for opening jars Goal status: in progress  3.  Pt will be able to place at least 30 blocks using right hand with completion of Box and Blocks test.  Baseline: Right 26blocks, Left 28blocks Goal status: in progress   LONG TERM GOALS: Target date: 12/19/24  Pt will increase UEFS score to 70/80 to demonstrate improved function Baseline: 66/80 Goal status: in progress  2.  Patient will demonstrate at least 20 lbs R grip strength as needed to open jars and other containers.  Baseline: Right: 17.33 avg lbs; Left: 13 lbs Goal status: in  progress  3.  Patient will demo improved FM coordination as evidenced by completing nine-hole peg with use of R hand in 35 seconds or less and L hand in 41 seconds or less.  Baseline:  Right: 38.77 sec; Left: 46.75 sec Goal status: in progress   ASSESSMENT:  CLINICAL IMPRESSION: Patient is a 79 y.o. female who was seen today for occupational therapy evaluation for R sided weakness post CVA. Hx includes  HLD, macular degeneration, nephrolithiasis, hypothyroidism, HTN, fibromyalgia. Pt expressing frustration with decreased strength and coordination as needed for self-feeding (particularly cereal) and handwriting.  Pt receptive to recommendations of adaptive equipment and/or strategies as well as engagement in coordination and strengthening tasks with and without putty.  Pt will continue to benefit from skilled OT services in the outpatient setting to work on impairments as noted below to help pt return to PLOF as able.     PERFORMANCE DEFICITS: in functional skills including IADLs, strength, pain, endurance, and UE functional use, cognitive skills including sequencing, and psychosocial skills including environmental adaptation, habits, and routines and behaviors.      PLAN:  OT FREQUENCY: 1x/week  OT DURATION: 4 weeks  PLANNED INTERVENTIONS: 97168 OT Re-evaluation, 97535 self care/ADL training, 02889 therapeutic exercise, 97530 therapeutic activity, 97140 manual therapy, 97035 ultrasound, 97010 moist heat, 97010 cryotherapy, energy conservation, coping strategies training, patient/family education, and DME and/or AE instructions  RECOMMENDED OTHER SERVICES: none at this time  CONSULTED AND AGREED WITH PLAN OF CARE: Patient  PLAN FOR NEXT SESSION: AE education  INITIATE Coordination HEP Continue UE theraband HEP Educate in joint protection   Chaquetta Schlottman, OTR/L 12/02/2024, 11:53 AM   [facility]  at [facility] [address] [place],  [place] [address] Phone # [phone] Fax # [phone]         "

## 2024-12-04 ENCOUNTER — Encounter: Payer: Self-pay | Admitting: Physical Therapy

## 2024-12-04 ENCOUNTER — Ambulatory Visit: Admitting: Physical Therapy

## 2024-12-04 DIAGNOSIS — R2681 Unsteadiness on feet: Secondary | ICD-10-CM

## 2024-12-04 DIAGNOSIS — R278 Other lack of coordination: Secondary | ICD-10-CM | POA: Diagnosis not present

## 2024-12-04 DIAGNOSIS — M6281 Muscle weakness (generalized): Secondary | ICD-10-CM

## 2024-12-04 DIAGNOSIS — R2689 Other abnormalities of gait and mobility: Secondary | ICD-10-CM

## 2024-12-08 ENCOUNTER — Ambulatory Visit: Payer: Self-pay | Admitting: Rheumatology

## 2024-12-08 VITALS — BP 136/82 | HR 79

## 2024-12-08 DIAGNOSIS — M7062 Trochanteric bursitis, left hip: Secondary | ICD-10-CM

## 2024-12-08 DIAGNOSIS — M7061 Trochanteric bursitis, right hip: Secondary | ICD-10-CM | POA: Diagnosis not present

## 2024-12-08 MED ORDER — LIDOCAINE HCL 1 % IJ SOLN
1.5000 mL | INTRAMUSCULAR | Status: AC | PRN
  Administered 2024-12-08: 1.5 mL

## 2024-12-08 MED ORDER — TRIAMCINOLONE ACETONIDE 40 MG/ML IJ SUSP
40.0000 mg | INTRAMUSCULAR | Status: AC | PRN
  Administered 2024-12-08: 40 mg via INTRA_ARTICULAR

## 2024-12-08 NOTE — Progress Notes (Signed)
" ° °  Procedure Note  Patient: Paula Murphy             Date of Birth: 04-14-1946           MRN: [id number]             Visit Date: 12/08/2024  Procedures: Visit Diagnoses:  1. Trochanteric bursitis of both hips    Patient was seen last in November 2025.  At that time she had bilateral trochanteric bursa injections.  She was advised to come back for repeat injections if her symptoms persist.  She good response to trochanteric bursa injections.  She came today to have repeat injections to bilateral trochanteric bursa.  Side effects were reviewed and informed consent was obtained.  Bilateral trochanteric bursa injections were performed as described below.  Large Joint Inj: bilateral greater trochanter on 12/08/2024 1:27 PM Indications: pain Details: 27 G 1.5 in needle, lateral approach  Arthrogram: No  Medications (Right): 1.5 mL lidocaine  1 %; 40 mg triamcinolone  acetonide 40 MG/ML Aspirate (Right): 0 mL Medications (Left): 1.5 mL lidocaine  1 %; 40 mg triamcinolone  acetonide 40 MG/ML Aspirate (Left): 0 mL Outcome: tolerated well, no immediate complications  Risk of infection, tendon injury, nerve injury, hypopigmentation and dermal atrophy were discussed. Procedure, treatment alternatives, risks and benefits explained, specific risks discussed. Consent was given by the patient. Immediately prior to procedure a time out was called to verify the correct patient, procedure, equipment, support staff and site/side marked as required. Patient was prepped and draped in the usual sterile fashion.     Maya Nash, MD    "

## 2024-12-09 ENCOUNTER — Ambulatory Visit: Admitting: Physical Therapy

## 2024-12-09 ENCOUNTER — Encounter: Payer: Self-pay | Admitting: Physical Therapy

## 2024-12-09 ENCOUNTER — Ambulatory Visit

## 2024-12-09 DIAGNOSIS — M6281 Muscle weakness (generalized): Secondary | ICD-10-CM

## 2024-12-09 DIAGNOSIS — R2689 Other abnormalities of gait and mobility: Secondary | ICD-10-CM

## 2024-12-09 DIAGNOSIS — R278 Other lack of coordination: Secondary | ICD-10-CM

## 2024-12-09 DIAGNOSIS — R29898 Other symptoms and signs involving the musculoskeletal system: Secondary | ICD-10-CM

## 2024-12-09 DIAGNOSIS — R2681 Unsteadiness on feet: Secondary | ICD-10-CM

## 2024-12-09 NOTE — Therapy (Addendum)
 " OUTPATIENT OCCUPATIONAL THERAPY NEURO Treatment Note  Patient Name: Paula Murphy MRN: [id number] DOB:02-09-1946, 79 y.o., female Today's Date: 12/09/2024  PCP: Nichole Senior, MD REFERRING PROVIDER: Cherlyn Labella, MD  END OF SESSION:  OT End of Session - 12/09/24 1103     Visit Number 4    Number of Visits 5    Date for Recertification  12/19/24    Authorization Type [employer] MCR-auth required    Authorization Time Period Auth#: [id number], approved 5 OT visits from 11/11/24-12/19/24. (asked for extended auth on 12/09/24)    OT Start Time 1102    OT Stop Time 1141    OT Time Calculation (min) 39 min    Equipment Utilized During Treatment coordination items, testing materials    Activity Tolerance Patient tolerated treatment well    Behavior During Therapy WFL for tasks assessed/performed             Past Medical History:  Diagnosis Date   Arthritis    OA AND PAIN RT KNEE   Autoimmune disease    + ANA, and DS DNA--PT STATES SHE WAS TOLD SHE DOES NOT HAVE LUPUS AS PREVIOUSLY THOUGHT   Fibromyalgia    followed by Dr. 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   on face   TONSILLECTOMY  11/06/1951   TOTAL KNEE ARTHROPLASTY Right 03/02/2013   Procedure: RIGHT TOTAL KNEE ARTHROPLASTY;  Surgeon: Dempsey LULLA Moan, MD;  Location: [facility];  Service: Orthopedics;  Laterality: Right;   TOTAL KNEE ARTHROPLASTY Left 10/10/2020   Procedure: TOTAL KNEE ARTHROPLASTY;  Surgeon: Moan Dempsey, MD;   Location: [facility];  Service: Orthopedics;  Laterality: Left;    Patient Active Problem List   Diagnosis Date Noted   Right sided weakness 10/20/2024   Acute ischemic stroke (HCC) 10/20/2024   TIA (transient ischemic attack) 10/19/2024   Infarction of left basal ganglia (HCC) 10/19/2024   OP (osteoporosis) 12/26/2023   Primary osteoarthritis of left knee 10/10/2020   Ingrown toenail 02/02/2019   Primary osteoarthritis of both feet 03/21/2017   History of macular degeneration 03/21/2017   Other fatigue 03/21/2017   Age-related osteoporosis without current pathological fracture 03/21/2017   Esophageal reflux 06/29/2013   Actinic keratosis 04/11/2013   OA (osteoarthritis) of knee 03/02/2013   DEPRESSION 07/24/2010   Major depressive disorder, single episode, unspecified 07/24/2010   UTI 07/04/2010   ABDOMINAL BLOATING 01/20/2010   Fibromyalgia 10/04/2009   INSOMNIA, CHRONIC 10/04/2009   Autoimmune disease 10/04/2009   Vitamin D  deficiency 10/26/2008   Hypothyroidism 08/13/2008   Mixed hyperlipidemia 08/13/2008   Essential hypertension 08/13/2008   Headache 08/13/2008   Muscle pain 08/12/2008    ONSET DATE: 10/28/2024 referral date, 10/19/24-10/28/24 hospitalization  REFERRING DIAG: R53.1 (ICD-10-CM) - Right sided weakness R29.810 (ICD-10-CM) - Facial droop  THERAPY DIAG:  Muscle weakness (generalized)  Other lack of coordination  Other symptoms and signs involving the  musculoskeletal system  Rationale for Evaluation and Treatment: Rehabilitation  SUBJECTIVE:   SUBJECTIVE STATEMENT: Pt reports some fatigue this date. Pt accompanied by: self  PERTINENT HISTORY: MRI(+) L basal ganglia CVA. NIH 0 PMH: HLD, macular degeneration, nephrolithiasis, hypothyroidism, HTN, fibromyalgia.   PRECAUTIONS: Other: R sided weakness, blood thinners  WEIGHT BEARING RESTRICTIONS: No  PAIN:  Are you having pain? No  FALLS: Has patient fallen in last 6 months? No  LIVING  ENVIRONMENT: Lives with: lives alone Lives in: House/apartment Stairs: No Has following equipment at home: Single point cane, Walker - 4 wheeled, shower chair, Grab bars, and walking stick and 2 more rollators  PLOF: Independent and Independent with household mobility without device worked at [employer]  PATIENT GOALS: I'm going to [employer] the early part of February and my grandson is getting married in May. I want to be able to walk and be as self sufficient as possible for those things.  OBJECTIVE:  Note: Objective measures were completed at Evaluation unless otherwise noted.  HAND DOMINANCE: Right  ADLs: Overall ADLs: Pt reports no troubles with any ADLs except shower/tub transfers. She has a friend who supervises her transfers to/from shower/tub combo. Transfers/ambulation related to ADLs: Eating: I Grooming: I UB Dressing: I LB Dressing: I Toileting: I Bathing: I Tub Shower transfers: SPV/SBA Equipment: Shower seat without back and Grab bars  IADLs: Shopping: Hasn't attempted since stroke, friend or daughter delivering groceries. Light housekeeping: Hired help PLOF once a month, pt was  Meal Prep: Hasn't since stroke Community mobility: Currently not driving  Medication management: Independent Financial management: Independent Handwriting: Pt reports slight changes but that it is getting better every day.  MOBILITY STATUS: Using rollator for functional mobility at this time and noted shuffling gait  POSTURE COMMENTS:  No Significant postural limitations Sitting balance: WFL  ACTIVITY TOLERANCE: Activity tolerance: Not walking as long but pt reports this is d/t fibromyalgia  FUNCTIONAL OUTCOME MEASURES: Upper Extremity Functional Scale (UEFS): 66/80 12/09/24: 65/80   UPPER EXTREMITY ROM:  4/5 RUE grossly, 4+/5 LUE  Active ROM Right eval Left eval  Shoulder flexion    Shoulder abduction    Shoulder adduction    Shoulder extension    Shoulder  internal rotation    Shoulder external rotation    Elbow flexion    Elbow extension    Wrist flexion    Wrist extension    Wrist ulnar deviation    Wrist radial deviation    Wrist pronation    Wrist supination    (Blank rows = not tested)  UPPER EXTREMITY MMT:     MMT Right eval Left eval  Shoulder flexion    Shoulder abduction    Shoulder adduction    Shoulder extension    Shoulder internal rotation    Shoulder external rotation    Middle trapezius    Lower trapezius    Elbow flexion    Elbow extension    Wrist flexion    Wrist extension    Wrist ulnar deviation    Wrist radial deviation    Wrist pronation    Wrist supination    (Blank rows = not tested)  HAND FUNCTION: Grip strength: Right: 17.33 avg lbs; Left: 13 lbs 12/09/24: 19 lbs average R hand COORDINATION: 9 Hole Peg test: Right: 38.77 sec; Left: 46.75 sec Box and Blocks:  Right 26blocks, Left 28blocks 12/09/24: Right: 34 sec; Left: 45 seconds 12/09/24: 30 blocks R hand SENSATION: WFL  EDEMA: none  MUSCLE  TONE: RUE: Within functional limits and LUE: Within functional limits  COGNITION: Overall cognitive status: Within functional limits for tasks assessed  VISION: Subjective report: I've been to the eye doctor and he said the stroke didn't affect my vision Baseline vision: Wears glasses for reading only Visual history: cataracts  VISION ASSESSMENT: Not tested  Patient has difficulty with following activities due to following visual impairments: none  PERCEPTION: WFL  PRAXIS: WFL  OBSERVATIONS: impaired hand strength (secondary to fibromyalgia), decline in ability to complete IADL and transfer to/from shower/tub combo, impaired UE strength                                                                                                                             TREATMENT DATE:  12/09/24  - Self-care/home management completed for duration as noted below including: Re-assessed grip strength,  9HPT, and Box and Blocks, as well as UEFS. Informed pt of results and progress.  Noted decreased UEFS score by 1 point from initial eval, pt reported I don't know why.  - Therapeutic activities completed for duration as noted below including: Educated pt in coordination HEP to improve coordination in B hands for carryover with ADLs/IADLs. See Pt instructions for handout. Pt asked purpose of working L hand FM coordination when she was R handed. Educated pt in importance of improving coordination in B hands for bilateral tasks. Pt verbalized understanding.  12/02/24 Coordination: engaged in picking up 4-5 Connect 4 pieces in hand and translating from palm to finger tips to place into Connect 4 grid.  Pt demonstrating ability to complete, stating that it feels like her thumb is the problem.   AE/DME:  OT educated on use of built up handles with self-feeding and handwriting.  Pt completing handwriting and preferring use of larger standard pen including pen grip to allow for increased ease of sustained grasp and manipulation.  OT also educating on potential benefit of purchasing some spoons with larger handles to allow for increased grasp and manipulation.   Putty: engaged in full hand grip, thumb opposition, and removing stones from yellow therapy putty.  OT educating on carryover to improved grip and pinch strength as well as coordination as needed for handwriting and self-feeding.   Coins: OT educating on additional coordination tasks including picking up and stacking coins, translation of coins from palm of hand to finger tips, and rotating coin in finger tips.  OT educating on carryover to other functional tasks.    11/25/24 NMR: engaged in picking up rings and moving from one vertical stack of cones to the other with focus on precision pinch and motor control to not touch or knock over the targets.  Completed in standing for dynamic balance and endurance.  Pt taking a rest break between sets after  3-4 mins. Pipe tree puzzle: completed in sitting due to reports of lower back discomfort in standing, with focus on coordination and manipulation of pieces, increased shoulder flexion, and problem solving.  Pt demonstrating good functional use of RUE when picking up and manipulating pieces and no overshooting.  Moist heat: pt reports increased pain after unsupported sitting and standing activity, increasing from mild pain on arrival to 8-9/10.  Pt asking about why she had a stroke, OT educating on signs and symptoms of stroke.  Educated on BE FAST acronym and provided with handout. Pt reports pain reduced to 1 at end of session after use of heat 8-10 mins.       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 Person educated: Patient Education method: Explanation, Demonstration, Verbal cues, and Handouts Education comprehension: verbalized understanding, returned demonstration, and needs further education  HOME EXERCISE PROGRAM: Access Code: [email or web address] URL: [email or web address] Date: 12/03/2024 Prepared by: [facility] - Outpatient  Rehab - [facility]  Exercises - Putty Squeezes  - 2 x daily - 10 reps - Thumb Opposition with Putty  - 2 x daily - 10 reps - 3-Point Pinch with Putty  - 2 x daily - 10 reps - Finger Pinch and Pull with Putty  - 2 x daily - 10 reps - Removing Marbles from Putty  - 1 x daily - 3 reps  12/02/24 - handwritten coordination tasks with coins 12/09/24-coordination HEP handout   GOALS: Goals reviewed with patient? Yes  SHORT TERM GOALS: Target date: 12/19/24  Patient will demonstrate  RUE and LUE HEP with 25% verbal cues or less for proper execution.  Baseline: New to OP OT Goal status: in progress  2.  Pt will verbalize understanding of adapted strategies and/or equipment PRN to increase safety and independence with ADLs and IADLs (I.e. adaptive cutting board, rocker knife, etc.)  Baseline: Pt has  sticky pad for opening jars Goal status: in progress  3.  Pt will be able to place at least 30 blocks using right hand with completion of Box and Blocks test.  Baseline: Right 26blocks, Left 28blocks 12/09/24: 30 blocks R hand Goal status: MET   LONG TERM GOALS: Target date: 12/23/24  Pt will increase UEFS score to 70/80 to demonstrate improved function Baseline: 66/80 12/09/24: 65/80 Goal status: in progress  2.  Patient will demonstrate at least 20 lbs R grip strength as needed to open jars and other containers.  Baseline: Right: 17.33 avg lbs; Left: 13 lbs 12/09/24: 19 lbs average R hand Goal status: in progress  3.  Patient will demo improved FM coordination as evidenced by completing nine-hole peg with use of R hand in 35 seconds or less and L hand in 41 seconds or less.  Baseline:  Right: 38.77 sec; Left: 46.75 sec  Goal status: in progress   ASSESSMENT:  CLINICAL IMPRESSION: Patient is a 79 y.o. female who was seen today for occupational therapy tx for R sided weakness post CVA. Hx includes  HLD, macular degeneration, nephrolithiasis, hypothyroidism, HTN, fibromyalgia. Pt making progress in coordination and grip strength, met STG for Box and Blocks. UEFS score decreased by one point, however pt reports no changes. Pt will continue to benefit from skilled OT services in the outpatient setting to work on impairments as noted below to help pt return to PLOF as able.     PERFORMANCE DEFICITS: in functional skills including IADLs, strength, pain, endurance, and UE functional use, cognitive skills including sequencing, and psychosocial skills including environmental adaptation, habits, and routines and behaviors.      PLAN:  OT FREQUENCY: 1x/week  OT DURATION: 4 weeks  PLANNED INTERVENTIONS: 02831 OT Re-evaluation, 97535 self care/ADL training,  97110 therapeutic exercise, 97530 therapeutic activity, 97140 manual therapy, 97035 ultrasound, 02989 moist heat, 97010 cryotherapy,  energy conservation, coping strategies training, patient/family education, and DME and/or AE instructions  RECOMMENDED OTHER SERVICES: none at this time  CONSULTED AND AGREED WITH PLAN OF CARE: Patient  PLAN FOR NEXT SESSION: RE-CERT OR D/C Golf solitaire prn Add   Rocky Dutch, OTR/L 12/09/2024, 11:47 AM   [facility] at [facility] [address] [place], [place] [address] Phone # [phone] Fax # [phone]         "

## 2024-12-09 NOTE — Patient Instructions (Signed)
 SABRA

## 2024-12-10 NOTE — Therapy (Signed)
 " OUTPATIENT PHYSICAL THERAPY NEURO TREATMENT   Patient Name: Paula Murphy MRN: [id number] DOB:1946-04-30, 79 y.o., female Today's Date: 12/11/2024   PCP: Nichole Senior, MD  REFERRING PROVIDER: Cherlyn Labella, MD >F/u with Venetia Potters, MD  END OF SESSION:  PT End of Session - 12/11/24 1040     Visit Number 8    Number of Visits 17    Date for Recertification  01/08/25    Authorization Type [employer]-11/11/24-01/08/25    Authorization - Visit Number 8    Authorization - Number of Visits 17    Progress Note Due on Visit 10    PT Start Time 1016    PT Stop Time 1058    PT Time Calculation (min) 42 min    Equipment Utilized During Treatment Gait belt    Activity Tolerance Patient tolerated treatment well    Behavior During Therapy WFL for tasks assessed/performed                Past Medical History:  Diagnosis Date   Arthritis    OA AND PAIN RT KNEE   Autoimmune disease    + ANA, and DS DNA--PT STATES SHE WAS TOLD SHE DOES NOT HAVE LUPUS AS PREVIOUSLY THOUGHT   Fibromyalgia    followed by Dr. 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   on face   TONSILLECTOMY  11/06/1951   TOTAL KNEE ARTHROPLASTY Right 03/02/2013   Procedure: RIGHT TOTAL KNEE ARTHROPLASTY;  Surgeon: Dempsey LULLA Moan, MD;  Location: [facility];  Service: Orthopedics;  Laterality: Right;   TOTAL KNEE ARTHROPLASTY Left 10/10/2020   Procedure: TOTAL KNEE ARTHROPLASTY;  Surgeon: Moan Dempsey, MD;  Location: [facility];   Service: Orthopedics;  Laterality: Left;    Patient Active Problem List   Diagnosis Date Noted   Right sided weakness 10/20/2024   Acute ischemic stroke (HCC) 10/20/2024   TIA (transient ischemic attack) 10/19/2024   Infarction of left basal ganglia (HCC) 10/19/2024   OP (osteoporosis) 12/26/2023   Primary osteoarthritis of left knee 10/10/2020   Ingrown toenail 02/02/2019   Primary osteoarthritis of both feet 03/21/2017   History of macular degeneration 03/21/2017   Other fatigue 03/21/2017   Age-related osteoporosis without current pathological fracture 03/21/2017   Esophageal reflux 06/29/2013   Actinic keratosis 04/11/2013   OA (osteoarthritis) of knee 03/02/2013   DEPRESSION 07/24/2010   Major depressive disorder, single episode, unspecified 07/24/2010   UTI 07/04/2010   ABDOMINAL BLOATING 01/20/2010   Fibromyalgia 10/04/2009   INSOMNIA, CHRONIC 10/04/2009   Autoimmune disease 10/04/2009   Vitamin D  deficiency 10/26/2008   Hypothyroidism 08/13/2008   Mixed hyperlipidemia 08/13/2008   Essential hypertension 08/13/2008   Headache 08/13/2008   Muscle pain 08/12/2008    ONSET DATE: 10/28/2024 MD referral/10/19/2024 TIA  REFERRING DIAG:  R53.1 (ICD-10-CM) - Right sided weakness  R29.810 (ICD-10-CM) - Facial droop    THERAPY DIAG:  Muscle weakness (generalized)  Other lack of coordination  Other symptoms and signs involving the musculoskeletal system  Unsteadiness on feet  Rationale for Evaluation and Treatment: Rehabilitation  SUBJECTIVE:                                                                                                                                                                                             SUBJECTIVE STATEMENT: My fibromyalgia has been killing me for the past 2 days. Believes it is d/t the weather.    Pt accompanied by: self  PERTINENT HISTORY: history of osteoarthritis, fibromyalgia, hyperlipidemia, hypothyroidism,  degenerative disc disease, depression, nephrolithiasis. Patient presented secondary to right-sided weakness with initial concern for TIA versus stroke. Hx of L sided weakness from Covid (per pt report) PAIN:  Are you having pain? Yes: NPRS scale: 3/10 Pain location: midback Pain description: achy Aggravating factors: weather Relieving factors: meds, heating pad  PRECAUTIONS: Fall  RED FLAGS: None   WEIGHT BEARING RESTRICTIONS: No  FALLS: Has patient fallen in last 6 months? No  LIVING ENVIRONMENT: Lives with: lives with their family and lives alone Has supportive neighbors and friends Lives in: House/apartment Stairs: No Has following equipment at home: Single point cane, Environmental Consultant - 2 wheeled, Environmental Consultant - 4 wheeled, shower chair, Grab bars, and walking stick  PLOF: Independent and Leisure: enjoys going to [employer], travelling to [place]  PATIENT GOALS: To be able to walk better for upcoming beach trip, to be able to walk better for grandson's wedding in May  OBJECTIVE:      TODAY'S TREATMENT: 12/11/24 Activity Comments  Nustep L4 x 6 min UEs/LEs  Dynamic warm up ; quick speed for lastr minute   gait training with quad tip cane 2x172ft Pt steadier with quad tip rather than SPC; effort to elongate R steps for step through pattern and required min A for sequencing, weaning to CGA  standing PWR up 2x10  fwd/back step over 1/2 foam roll romberg on foam, then added head turns/nods Standing on foam EC sidestepping onto/off foam In II bars; weaned UE support. Hesitant with head movements   sitting red TB rows 2x10 Cueing for scap retraction            HOME EXERCISE PROGRAM Last updated: 12/11/24 Access Code: [email or web address] URL: [email or web address] Date: 12/11/2024 Prepared by: [facility] - Outpatient  Rehab - [facility]  Program Notes While walking with your walker, make sure to walk with heel to toe pattern, taking long strides.  Exercises - Seated March  - 1 x  daily - 7 x weekly - 3 sets - 10 reps - Seated Long  Arc Quad  - 1 x daily - 7 x weekly - 3 sets - 10 reps - Seated Heel Toe Raises  - 1 x daily - 7 x weekly - 3 sets - 10 reps - Sit to Stand  - 1-2 x daily - 7 x weekly - 3 sets - 5 reps - Alternating Step Taps with Counter Support  - 1 x daily - 5 x weekly - 2 sets - 10 reps - Wide Stance with Counter Support  - 1 x daily - 5 x weekly - 1-2 sets - 5 reps - Narrow Stance with Counter Support  - 1 x daily - 5 x weekly - 1-2 sets - 5 reps - Seated Shoulder Row with Anchored Resistance  - 1 x daily - 5 x weekly - 2 sets - 10 reps  PATIENT EDUCATION: Education details: rest breaks d/t back pain focused on feedback from gait training , HEP update Person educated: Patient Education method: Explanation, Demonstration, Tactile cues, Verbal cues, and Handouts Education comprehension: verbalized understanding and returned demonstration    Access Code: [email or web address] URL: [email or web address] Date: 11/27/2024 Prepared by: [facility] - Outpatient  Rehab - [facility]  Program Notes While walking with your walker, make sure to walk with heel to toe pattern, taking long strides.  Exercises - Seated March  - 1 x daily - 7 x weekly - 3 sets - 10 reps - Seated Long Arc Quad  - 1 x daily - 7 x weekly - 3 sets - 10 reps - Seated Heel Toe Raises  - 1 x daily - 7 x weekly - 3 sets - 10 reps - Sit to Stand  - 1-2 x daily - 7 x weekly - 3 sets - 5 reps - Alternating Step Taps with Counter Support  - 1 x daily - 5 x weekly - 2 sets - 10 reps - Wide Stance with Counter Support  - 1 x daily - 5 x weekly - 1-2 sets - 5 reps - Narrow Stance with Counter Support  - 1 x daily - 5 x weekly - 1-2 sets - 5 reps   ----------------------------- Note: Objective measures were completed at Evaluation unless otherwise noted.  DIAGNOSTIC FINDINGS: MRI confirms a left basal ganglia stroke.   COGNITION: Overall cognitive status: Within functional limits  for tasks assessed   SENSATION: Light touch: WFL Reports increased pain due to fibromyalgia  COORDINATION: WFL  EDEMA:  Some edema LLE, that is longstanding (reports MDs are aware, just not sure what causes it)  POSTURE: rounded shoulders, forward head, and flexed trunk  In standing, has knees flexed LOWER EXTREMITY ROM:   WFL  Active  Right Eval Left Eval  Hip flexion    Hip extension    Hip abduction    Hip adduction    Hip internal rotation    Hip external rotation    Knee flexion    Knee extension    Ankle dorsiflexion    Ankle plantarflexion    Ankle inversion    Ankle eversion     (Blank rows = not tested)  LOWER EXTREMITY MMT:    MMT Right Eval Left Eval  Hip flexion 4 4  Hip extension    Hip abduction 4 4  Hip adduction 4 4  Hip internal rotation    Hip external rotation    Knee flexion 4 4  Knee extension 4 4  Ankle dorsiflexion 4 4  Ankle plantarflexion    Ankle inversion  Ankle eversion    (Blank rows = not tested)  TRANSFERS: Sit to stand: SBA and CGA  Assistive device utilized: None     Stand to sit: SBA and CGA  Assistive device utilized: None      GAIT: Findings: Gait Characteristics: step through pattern, decreased step length- Right, decreased step length- Left, decreased ankle dorsiflexion- Right, decreased ankle dorsiflexion- Left, shuffling, poor foot clearance- Right, and poor foot clearance- Left, Distance walked: 50 ft, Assistive device utilized:Walker - 4 wheeled, and Level of assistance: SBA Pt bumps into furniture, wall/doorways on R, especially with turning FUNCTIONAL TESTS:  5 times sit to stand: 27.5 sec arms crossed at chest Timed up and go (TUG): 25.1 sec 10 meter walk test: 32.62 sec= 1 ft/sec Berg Balance test:  39/56                                                                                                                                TREATMENT DATE: 11/11/2024    PATIENT EDUCATION: Education details: PT  eval results, POC, initial HEP and cues for heelstrike with gait (to not hear the scuffing/shuffling) Person educated: Patient Education method: Explanation, Demonstration, Verbal cues, and Handouts Education comprehension: verbalized understanding, returned demonstration, and needs further education  HOME EXERCISE PROGRAM: Access Code: [email or web address] URL: [email or web address] Date: 11/11/2024 Prepared by: [facility] [place] - Outpatient  Rehab - [facility]  Program Notes While walking with your walker, make sure to walk with heel to toe pattern, taking long strides.  Exercises - Seated March  - 1 x daily - 7 x weekly - 3 sets - 10 reps - Seated Long Arc Quad  - 1 x daily - 7 x weekly - 3 sets - 10 reps - Seated Heel Toe Raises  - 1 x daily - 7 x weekly - 3 sets - 10 reps  GOALS: Goals reviewed with patient? Yes  SHORT TERM GOALS: Target date: 12/11/2024  Pt will be independent with HEP for improved strength, balance, gait. Baseline: Goal status: INITIAL  2.  Pt will improve 5x sit<>stand to less than or equal to 20 sec to demonstrate improved functional strength and transfer efficiency. Baseline: 27.5 sec Goal status: INITIAL  3.  Pt will improve Berg score to at least 45/56 to decrease fall risk. Baseline: 39/56 Goal status: INITIAL   LONG TERM GOALS: Target date: 01/08/2025  Pt will be independent with HEP for improved balance, strength, gait. Baseline:  Goal status: INITIAL  2.  Pt will improve 5x sit<>stand to less than or equal to 15 sec to demonstrate improved functional strength and transfer efficiency. Baseline: 27.5 sec Goal status: INITIAL  3.  Pt will improve TUG score to less than or equal to 15 sec for decreased fall risk. Baseline: 25.1 sec Goal status: INITIAL  4.  Pt will improve gait velocity to at least 2.62 ft/sec for improved gait efficiency and safety. Baseline: 1 ft/sec with 4WW Goal  status: INITIAL  5.  Pt will ambulate at least 500  ft, indoor and outdoor surfaces, mod I for improved gait efficiency and safety in the community. Baseline:  Goal status: INITIAL   ASSESSMENT:  CLINICAL IMPRESSION: Patient arrived to session with report of increased midback pain for the past couple days, believes d/t fibromyalgia flare. Session focused on gait training with quad tip cane; patient initially required min A for sequencing and timing but progressed to CGA with practice and appeared steadier with quad tip cane. Standing balance activities continued to promote tall/long steps. Patient tolerated session well and without complaints at end of appointment.  OBJECTIVE IMPAIRMENTS: Abnormal gait, decreased activity tolerance, decreased balance, decreased mobility, difficulty walking, decreased strength, and postural dysfunction.   ACTIVITY LIMITATIONS: carrying, lifting, bending, standing, squatting, transfers, reach over head, and locomotion level  PARTICIPATION LIMITATIONS: meal prep, cleaning, laundry, driving, shopping, and community activity  PERSONAL FACTORS: 3+ comorbidities: see PMH above are also affecting patient's functional outcome.   REHAB POTENTIAL: Good  CLINICAL DECISION MAKING: Evolving/moderate complexity  EVALUATION COMPLEXITY: Moderate  PLAN:  PT FREQUENCY: 2x/week  PT DURATION: 8 weeks plus eval visit  PLANNED INTERVENTIONS: 97750- Physical Performance Testing, 97110-Therapeutic exercises, 97530- Therapeutic activity, 97112- Neuromuscular re-education, 97535- Self Care, [id number]- Manual therapy, [id number]- Gait training, Patient/Family education, and Balance training  PLAN FOR NEXT SESSION: Review additions to HEP and progress for standing balance, sit to stand/functional strengthening and progression to more independent gait; may need to utilize Nustep to help with flexibility, strength, endurance. Gait training w/ quad  tip cane   Louana Terrilyn Christians, PT, DPT 12/11/24 10:59 AM  [facility] at [facility] [address] [place], [address] [place], [place] [address] Phone # [phone] Fax # [phone]       "

## 2024-12-11 ENCOUNTER — Encounter: Payer: Self-pay | Admitting: Physical Therapy

## 2024-12-11 ENCOUNTER — Ambulatory Visit: Admitting: Physical Therapy

## 2024-12-11 DIAGNOSIS — R2681 Unsteadiness on feet: Secondary | ICD-10-CM

## 2024-12-11 DIAGNOSIS — R278 Other lack of coordination: Secondary | ICD-10-CM

## 2024-12-11 DIAGNOSIS — R29898 Other symptoms and signs involving the musculoskeletal system: Secondary | ICD-10-CM

## 2024-12-11 DIAGNOSIS — M6281 Muscle weakness (generalized): Secondary | ICD-10-CM

## 2024-12-23 ENCOUNTER — Ambulatory Visit

## 2024-12-30 ENCOUNTER — Ambulatory Visit: Admitting: Physical Therapy

## 2025-01-06 ENCOUNTER — Ambulatory Visit: Admitting: Physical Therapy

## 2025-03-23 ENCOUNTER — Ambulatory Visit: Payer: Self-pay | Admitting: Rheumatology

## 2025-06-08 ENCOUNTER — Ambulatory Visit: Admitting: Rheumatology

## 2025-06-11 ENCOUNTER — Ambulatory Visit: Payer: Self-pay | Admitting: Neurology
